# Patient Record
Sex: Male | Born: 1950 | Race: White | Hispanic: No | Marital: Married | Smoking: Former smoker
Health system: Southern US, Community
[De-identification: ages and names within clinical notes are randomized; demographics above are authoritative.]

## PROBLEM LIST (undated history)

## (undated) DIAGNOSIS — A0472 Enterocolitis due to Clostridium difficile, not specified as recurrent: Secondary | ICD-10-CM

## (undated) DIAGNOSIS — D649 Anemia, unspecified: Secondary | ICD-10-CM

## (undated) DIAGNOSIS — I10 Essential (primary) hypertension: Secondary | ICD-10-CM

## (undated) DIAGNOSIS — M1990 Unspecified osteoarthritis, unspecified site: Secondary | ICD-10-CM

## (undated) DIAGNOSIS — K5190 Ulcerative colitis, unspecified, without complications: Secondary | ICD-10-CM

## (undated) DIAGNOSIS — T7840XA Allergy, unspecified, initial encounter: Secondary | ICD-10-CM

## (undated) DIAGNOSIS — C439 Malignant melanoma of skin, unspecified: Secondary | ICD-10-CM

## (undated) DIAGNOSIS — I82409 Acute embolism and thrombosis of unspecified deep veins of unspecified lower extremity: Secondary | ICD-10-CM

## (undated) DIAGNOSIS — E7800 Pure hypercholesterolemia, unspecified: Secondary | ICD-10-CM

## (undated) DIAGNOSIS — Z5189 Encounter for other specified aftercare: Secondary | ICD-10-CM

## (undated) HISTORY — DX: Malignant melanoma of skin, unspecified: C43.9

## (undated) HISTORY — DX: Essential (primary) hypertension: I10

## (undated) HISTORY — DX: Encounter for other specified aftercare: Z51.89

## (undated) HISTORY — DX: Enterocolitis due to Clostridium difficile, not specified as recurrent: A04.72

## (undated) HISTORY — DX: Pure hypercholesterolemia, unspecified: E78.00

## (undated) HISTORY — DX: Allergy, unspecified, initial encounter: T78.40XA

## (undated) HISTORY — DX: Anemia, unspecified: D64.9

## (undated) HISTORY — PX: COLONOSCOPY: SHX174

## (undated) HISTORY — DX: Acute embolism and thrombosis of unspecified deep veins of unspecified lower extremity: I82.409

## (undated) HISTORY — DX: Unspecified osteoarthritis, unspecified site: M19.90

## (undated) HISTORY — DX: Ulcerative colitis, unspecified, without complications: K51.90

---

## 2018-08-30 ENCOUNTER — Encounter: Payer: Self-pay | Admitting: Family Medicine

## 2018-08-30 ENCOUNTER — Ambulatory Visit: Payer: 59 | Admitting: Family Medicine

## 2018-08-30 VITALS — BP 152/82 | HR 85 | Temp 98.8°F | Ht 71.0 in | Wt 236.0 lb

## 2018-08-30 DIAGNOSIS — M545 Low back pain, unspecified: Secondary | ICD-10-CM

## 2018-08-30 DIAGNOSIS — H16139 Photokeratitis, unspecified eye: Secondary | ICD-10-CM | POA: Diagnosis not present

## 2018-08-30 DIAGNOSIS — K51919 Ulcerative colitis, unspecified with unspecified complications: Secondary | ICD-10-CM | POA: Diagnosis not present

## 2018-08-30 DIAGNOSIS — D489 Neoplasm of uncertain behavior, unspecified: Secondary | ICD-10-CM

## 2018-08-30 DIAGNOSIS — I10 Essential (primary) hypertension: Secondary | ICD-10-CM

## 2018-08-30 MED ORDER — VALSARTAN-HYDROCHLOROTHIAZIDE 160-25 MG PO TABS: 1.0000 | ORAL_TABLET | Freq: Every day | ORAL | 3 refills | Status: DC

## 2018-08-30 MED ORDER — CYCLOBENZAPRINE HCL 5 MG PO TABS: 5.0000 mg | ORAL_TABLET | Freq: Three times a day (TID) | ORAL | 0 refills | Status: DC | PRN

## 2018-08-30 MED ORDER — FOLIC ACID 1 MG PO TABS: 1.0000 mg | ORAL_TABLET | Freq: Every day | ORAL | 3 refills | Status: DC

## 2018-08-30 NOTE — Progress Notes (Signed)
Chief Complaint  Patient presents with  . New Patient (Initial Visit)       New Patient Visit SUBJECTIVE: HPI: Justin Rosales is an 68 y.o.male who is being seen for establishing care.   The patient was previously seen at [facility].  Hypertension Patient presents for hypertension follow up. He does monitor home blood pressures. Blood pressures ranging on average from 130's/70's. He is compliant with medications- Diovan 160-25. Patient has these side effects of medication: none He is adhering to a healthy diet overall. Exercise: golf, walking  2 days ago slept wrong on couch for around 1 hour. bl low back pain. A little better today. No neurologic s/s's.   Has skin lesion on his nose and L check. Nose lesion has been there for several months.  The lesion on his cheek is been there for around 6 months.  It will scab over and never fully go away.  He has no personal or family history of skin cancer.  He was out in the sun when he was younger and did not wear adequate amounts of sunscreen.    Past Medical History:  Diagnosis Date  . Anemia   . Arthritis   . Hypertension    History reviewed. No pertinent surgical history. Family History  Problem Relation Age of Onset  . Hypertension Mother   . Diabetes Mother   . Alzheimer's disease Mother   . Parkinson's disease Father   . Alzheimer's disease Father    Not on File    Current Outpatient Medications:  .  Ferrous Sulfate (IRON) 325 (65 Fe) MG TABS, Take 1 tablet by mouth daily., Disp: , Rfl:  .  metoprolol succinate (TOPROL-XL) 100 MG 24 hr tablet, Take 100 mg by mouth daily. Take with or immediately following a meal., Disp: , Rfl:  .  NON FORMULARY, Lectin shield Take 2 per meal, Disp: , Rfl:  .  NON FORMULARY, Vital reds, Disp: , Rfl:  .  sulfaSALAzine (AZULFIDINE) 500 MG tablet, Take 2 tablets twice daily, Disp: , Rfl:  .  valsartan-hydrochlorothiazide (DIOVAN-HCT) 160-25 MG tablet, Take 1 tablet by mouth daily., Disp: 90  tablet, Rfl: 3 .  VITAMIN D, CHOLECALCIFEROL, PO, Take 5,000 Units by mouth daily., Disp: , Rfl:  .  cyclobenzaprine (FLEXERIL) 5 MG tablet, Take 1 tablet (5 mg total) by mouth 3 (three) times daily as needed for muscle spasms., Disp: 20 tablet, Rfl: 0 .  folic acid (FOLVITE) 1 MG tablet, Take 1 tablet (1 mg total) by mouth daily., Disp: 90 tablet, Rfl: 3  ROS Cardiovascular: Denies chest pain  Respiratory: Denies dyspnea   OBJECTIVE: BP (!) 152/82 (BP Location: Left Arm, Patient Position: Sitting, Cuff Size: Large)   Pulse 85   Temp 98.8 F (37.1 C) (Oral)   Ht 5' 11"  (1.803 m)   Wt 236 lb (107 kg)   SpO2 95%   BMI 32.92 kg/m   Constitutional: -  VS reviewed -  Well developed, well nourished, appears stated age -  No apparent distress  Psychiatric: -  Oriented to person, place, and time -  Memory intact -  Affect and mood normal -  Fluent conversation, good eye contact -  Judgment and insight age appropriate  Eye: -  Conjunctivae clear, no discharge -  Pupils symmetric, round, reactive to light  ENMT: -  MMM    Pharynx moist, no exudate, no erythema  Neck: -  No gross swelling, no palpable masses -  Thyroid midline, not enlarged, mobile,  no palpable masses  Cardiovascular: -  RRR -  No LE edema  Respiratory: -  Normal respiratory effort, no accessory muscle use, no retraction -  Breath sounds equal, no wheezes, no ronchi, no crackles  Gastrointestinal: -  Bowel sounds normal -  No tenderness, no distention, no guarding, no masses  Neurological:  -  CN II - XII grossly intact -  Sensation grossly intact to light touch, equal bilaterally  Musculoskeletal: -  No clubbing, no cyanosis -  Gait normal  Skin: -  See below -  Erythematous and scaly lesion on nose    L cheek  Procedure note; shave biopsy Informed consent was obtained. The area was cleaned with alcohol and injected with 1.5 mL of 1% lidocaine with epinephrine. A Dermablade was slightly bent and used to  cut under the area of interest. The specimen was placed in a sterile specimen cup and sent to the lab. The area was then cauterized ensuring adequate hemostasis. The area was dressed with triple antibiotic ointment and a bandage. There were no complications noted. The patient tolerated the procedure well.  Procedure note: cryotherapy Verbal consent obtained 1 skin lesion on his nose treated Liquid nitrogen was applied via a thin spray creating an ice ball with 1-2 mm corona surrounding the lesion The patient tolerated the procedure well There were no immediate complications noted  ASSESSMENT/PLAN: Ulcerative colitis with complication, unspecified location ([place]) - Plan: sulfaSALAzine (AZULFIDINE) 707 MG tablet, folic acid (FOLVITE) 1 MG tablet, Ambulatory referral to Gastroenterology  Essential hypertension - Plan: valsartan-hydrochlorothiazide (DIOVAN-HCT) 160-25 MG tablet  Acute bilateral low back pain without sciatica - Plan: cyclobenzaprine (FLEXERIL) 5 MG tablet  Neoplasm of uncertain behavior - Plan: Dermatology pathology([place]), PR SHAV SKIN LES 0.6-1.0 CM FACE,FACIAL  Actinic keratitis, unspecified laterality - Plan: PR DESTRUCTION BENIGN LESIONS UP TO 14  Refer to GI, cont current meds. Sounds like he has white coat syndrome.  Continue current medications.  Check blood pressures at home.  Counseled on diet and exercise. Tylenol, stretches/exercises, heat, low-dose of Flexeril.  Warning signs/symptoms verbalized and written down. The area on his nose appears to be an actinic keratotic lesion.  It was frozen today.  If it returns, we will shave it.  On his cheek, I am very concerned for a malignancy.  Biopsy sent to the lab today. Patient should return for physical at convenience. The patient voiced understanding and agreement to the plan.   [place], DO 08/30/18  11:59 AM

## 2018-08-30 NOTE — Patient Instructions (Addendum)
Your goal BP is <150/90.   Ok to stop the metoprolol for now.  Keep the diet clean and stay active.  OK to take Tylenol 1000 mg (2 extra strength tabs) or 975 mg (3 regular strength tabs) every 6 hours as needed.  Take Flexeril (cyclobenzaprine) 1-2 hours before planned bedtime. If it makes you drowsy, do not take during the day. You can try half a tab the following night.  If you do not hear anything about your referral in the next 1-2 weeks, call our office and ask for an update.  Do not shower for the rest of the day. When you do wash it, use only soap and water. Do not vigorously scrub. Apply triple antibiotic ointment (like Neosporin) twice daily. Keep the area clean and dry.   Things to look out for: increasing pain not relieved by ibuprofen/acetaminophen, fevers, spreading redness, drainage of pus, or foul odor.  EXERCISES  RANGE OF MOTION (ROM) AND STRETCHING EXERCISES - Low Back Pain Most people with lower back pain will find that their symptoms get worse with excessive bending forward (flexion) or arching at the lower back (extension). The exercises that will help resolve your symptoms will focus on the opposite motion.  If you have pain, numbness or tingling which travels down into your buttocks, leg or foot, the goal of the therapy is for these symptoms to move closer to your back and eventually resolve. Sometimes, these leg symptoms will get better, but your lower back pain may worsen. This is often an indication of progress in your rehabilitation. Be very alert to any changes in your symptoms and the activities in which you participated in the 24 hours prior to the change. Sharing this information with your caregiver will allow him or her to most efficiently treat your condition. These exercises may help you when beginning to rehabilitate your injury. Your symptoms may resolve with or without further involvement from your physician, physical therapist or athletic trainer. While  completing these exercises, remember:   Restoring tissue flexibility helps normal motion to return to the joints. This allows healthier, less painful movement and activity.  An effective stretch should be held for at least 30 seconds.  A stretch should never be painful. You should only feel a gentle lengthening or release in the stretched tissue. FLEXION RANGE OF MOTION AND STRETCHING EXERCISES:  STRETCH - Flexion, Single Knee to Chest   Lie on a firm bed or floor with both legs extended in front of you.  Keeping one leg in contact with the floor, bring your opposite knee to your chest. Hold your leg in place by either grabbing behind your thigh or at your knee.  Pull until you feel a gentle stretch in your low back. Hold 30 seconds.  Slowly release your grasp and repeat the exercise with the opposite side. Repeat 2 times. Complete this exercise 3 times per week.   STRETCH - Flexion, Double Knee to Chest  Lie on a firm bed or floor with both legs extended in front of you.  Keeping one leg in contact with the floor, bring your opposite knee to your chest.  Tense your stomach muscles to support your back and then lift your other knee to your chest. Hold your legs in place by either grabbing behind your thighs or at your knees.  Pull both knees toward your chest until you feel a gentle stretch in your low back. Hold 30 seconds.  Tense your stomach muscles and slowly return  one leg at a time to the floor. Repeat 2 times. Complete this exercise 3 times per week.   STRETCH - Low Trunk Rotation  Lie on a firm bed or floor. Keeping your legs in front of you, bend your knees so they are both pointed toward the ceiling and your feet are flat on the floor.  Extend your arms out to the side. This will stabilize your upper body by keeping your shoulders in contact with the floor.  Gently and slowly drop both knees together to one side until you feel a gentle stretch in your low back. Hold  for 30 seconds.  Tense your stomach muscles to support your lower back as you bring your knees back to the starting position. Repeat the exercise to the other side. Repeat 2 times. Complete this exercise at least 3 times per week.   EXTENSION RANGE OF MOTION AND FLEXIBILITY EXERCISES:  STRETCH - Extension, Prone on Elbows   Lie on your stomach on the floor, a bed will be too soft. Place your palms about shoulder width apart and at the height of your head.  Place your elbows under your shoulders. If this is too painful, stack pillows under your chest.  Allow your body to relax so that your hips drop lower and make contact more completely with the floor.  Hold this position for 30 seconds.  Slowly return to lying flat on the floor. Repeat 2 times. Complete this exercise 3 times per week.   RANGE OF MOTION - Extension, Prone Press Ups  Lie on your stomach on the floor, a bed will be too soft. Place your palms about shoulder width apart and at the height of your head.  Keeping your back as relaxed as possible, slowly straighten your elbows while keeping your hips on the floor. You may adjust the placement of your hands to maximize your comfort. As you gain motion, your hands will come more underneath your shoulders.  Hold this position 30 seconds.  Slowly return to lying flat on the floor. Repeat 2 times. Complete this exercise 3 times per week.   RANGE OF MOTION- Quadruped, Neutral Spine   Assume a hands and knees position on a firm surface. Keep your hands under your shoulders and your knees under your hips. You may place padding under your knees for comfort.  Drop your head and point your tailbone toward the ground below you. This will round out your lower back like an angry cat. Hold this position for 30 seconds.  Slowly lift your head and release your tail bone so that your back sags into a large arch, like an old horse.  Hold this position for 30 seconds.  Repeat this until  you feel limber in your low back.  Now, find your "sweet spot." This will be the most comfortable position somewhere between the two previous positions. This is your neutral spine. Once you have found this position, tense your stomach muscles to support your low back.  Hold this position for 30 seconds. Repeat 2 times. Complete this exercise 3 times per week.   STRENGTHENING EXERCISES - Low Back Sprain These exercises may help you when beginning to rehabilitate your injury. These exercises should be done near your "sweet spot." This is the neutral, low-back arch, somewhere between fully rounded and fully arched, that is your least painful position. When performed in this safe range of motion, these exercises can be used for people who have either a flexion or extension based  injury. These exercises may resolve your symptoms with or without further involvement from your physician, physical therapist or athletic trainer. While completing these exercises, remember:   Muscles can gain both the endurance and the strength needed for everyday activities through controlled exercises.  Complete these exercises as instructed by your physician, physical therapist or athletic trainer. Increase the resistance and repetitions only as guided.  You may experience muscle soreness or fatigue, but the pain or discomfort you are trying to eliminate should never worsen during these exercises. If this pain does worsen, stop and make certain you are following the directions exactly. If the pain is still present after adjustments, discontinue the exercise until you can discuss the trouble with your caregiver.  STRENGTHENING - Deep Abdominals, Pelvic Tilt   Lie on a firm bed or floor. Keeping your legs in front of you, bend your knees so they are both pointed toward the ceiling and your feet are flat on the floor.  Tense your lower abdominal muscles to press your low back into the floor. This motion will rotate your  pelvis so that your tail bone is scooping upwards rather than pointing at your feet or into the floor. With a gentle tension and even breathing, hold this position for 3 seconds. Repeat 2 times. Complete this exercise 3 times per week.   STRENGTHENING - Abdominals, Crunches   Lie on a firm bed or floor. Keeping your legs in front of you, bend your knees so they are both pointed toward the ceiling and your feet are flat on the floor. Cross your arms over your chest.  Slightly tip your chin down without bending your neck.  Tense your abdominals and slowly lift your trunk high enough to just clear your shoulder blades. Lifting higher can put excessive stress on the lower back and does not further strengthen your abdominal muscles.  Control your return to the starting position. Repeat 2 times. Complete this exercise 3 times per week.   STRENGTHENING - Quadruped, Opposite UE/LE Lift   Assume a hands and knees position on a firm surface. Keep your hands under your shoulders and your knees under your hips. You may place padding under your knees for comfort.  Find your neutral spine and gently tense your abdominal muscles so that you can maintain this position. Your shoulders and hips should form a rectangle that is parallel with the floor and is not twisted.  Keeping your trunk steady, lift your right hand no higher than your shoulder and then your left leg no higher than your hip. Make sure you are not holding your breath. Hold this position for 30 seconds.  Continuing to keep your abdominal muscles tense and your back steady, slowly return to your starting position. Repeat with the opposite arm and leg. Repeat 2 times. Complete this exercise 3 times per week.   STRENGTHENING - Abdominals and Quadriceps, Straight Leg Raise   Lie on a firm bed or floor with both legs extended in front of you.  Keeping one leg in contact with the floor, bend the other knee so that your foot can rest flat on the  floor.  Find your neutral spine, and tense your abdominal muscles to maintain your spinal position throughout the exercise.  Slowly lift your straight leg off the floor about 6 inches for a count of 3, making sure to not hold your breath.  Still keeping your neutral spine, slowly lower your leg all the way to the floor. Repeat this exercise  with each leg 2 times. Complete this exercise 3 times per week.  POSTURE AND BODY MECHANICS CONSIDERATIONS - Low Back Sprain Keeping correct posture when sitting, standing or completing your activities will reduce the stress put on different body tissues, allowing injured tissues a chance to heal and limiting painful experiences. The following are general guidelines for improved posture.  While reading these guidelines, remember:  The exercises prescribed by your provider will help you have the flexibility and strength to maintain correct postures.  The correct posture provides the best environment for your joints to work. All of your joints have less wear and tear when properly supported by a spine with good posture. This means you will experience a healthier, less painful body.  Correct posture must be practiced with all of your activities, especially prolonged sitting and standing. Correct posture is as important when doing repetitive low-stress activities (typing) as it is when doing a single heavy-load activity (lifting).  RESTING POSITIONS Consider which positions are most painful for you when choosing a resting position. If you have pain with flexion-based activities (sitting, bending, stooping, squatting), choose a position that allows you to rest in a less flexed posture. You would want to avoid curling into a fetal position on your side. If your pain worsens with extension-based activities (prolonged standing, working overhead), avoid resting in an extended position such as sleeping on your stomach. Most people will find more comfort when they rest  with their spine in a more neutral position, neither too rounded nor too arched. Lying on a non-sagging bed on your side with a pillow between your knees, or on your back with a pillow under your knees will often provide some relief. Keep in mind, being in any one position for a prolonged period of time, no matter how correct your posture, can still lead to stiffness.  PROPER SITTING POSTURE In order to minimize stress and discomfort on your spine, you must sit with correct posture. Sitting with good posture should be effortless for a healthy body. Returning to good posture is a gradual process. Many people can work toward this most comfortably by using various supports until they have the flexibility and strength to maintain this posture on their own. When sitting with proper posture, your ears will fall over your shoulders and your shoulders will fall over your hips. You should use the back of the chair to support your upper back. Your lower back will be in a neutral position, just slightly arched. You may place a small pillow or folded towel at the base of your lower back for  support.  When working at a desk, create an environment that supports good, upright posture. Without extra support, muscles tire, which leads to excessive strain on joints and other tissues. Keep these recommendations in mind:  CHAIR:  A chair should be able to slide under your desk when your back makes contact with the back of the chair. This allows you to work closely.  The chair's height should allow your eyes to be level with the upper part of your monitor and your hands to be slightly lower than your elbows.  BODY POSITION  Your feet should make contact with the floor. If this is not possible, use a foot rest.  Keep your ears over your shoulders. This will reduce stress on your neck and low back.  INCORRECT SITTING POSTURES  If you are feeling tired and unable to assume a healthy sitting posture, do not slouch or  slump.  This puts excessive strain on your back tissues, causing more damage and pain. Healthier options include:  Using more support, like a lumbar pillow.  Switching tasks to something that requires you to be upright or walking.  Talking a brief walk.  Lying down to rest in a neutral-spine position.  PROLONGED STANDING WHILE SLIGHTLY LEANING FORWARD  When completing a task that requires you to lean forward while standing in one place for a long time, place either foot up on a stationary 2-4 inch high object to help maintain the best posture. When both feet are on the ground, the lower back tends to lose its slight inward curve. If this curve flattens (or becomes too large), then the back and your other joints will experience too much stress, tire more quickly, and can cause pain.  CORRECT STANDING POSTURES Proper standing posture should be assumed with all daily activities, even if they only take a few moments, like when brushing your teeth. As in sitting, your ears should fall over your shoulders and your shoulders should fall over your hips. You should keep a slight tension in your abdominal muscles to brace your spine. Your tailbone should point down to the ground, not behind your body, resulting in an over-extended swayback posture.   INCORRECT STANDING POSTURES  Common incorrect standing postures include a forward head, locked knees and/or an excessive swayback. WALKING Walk with an upright posture. Your ears, shoulders and hips should all line-up.  PROLONGED ACTIVITY IN A FLEXED POSITION When completing a task that requires you to bend forward at your waist or lean over a low surface, try to find a way to stabilize 3 out of 4 of your limbs. You can place a hand or elbow on your thigh or rest a knee on the surface you are reaching across. This will provide you more stability, so that your muscles do not tire as quickly. By keeping your knees relaxed, or slightly bent, you will also reduce  stress across your lower back. CORRECT LIFTING TECHNIQUES  DO :  Assume a wide stance. This will provide you more stability and the opportunity to get as close as possible to the object which you are lifting.  Tense your abdominals to brace your spine. Bend at the knees and hips. Keeping your back locked in a neutral-spine position, lift using your leg muscles. Lift with your legs, keeping your back straight.  Test the weight of unknown objects before attempting to lift them.  Try to keep your elbows locked down at your sides in order get the best strength from your shoulders when carrying an object.     Always ask for help when lifting heavy or awkward objects. INCORRECT LIFTING TECHNIQUES DO NOT:   Lock your knees when lifting, even if it is a small object.  Bend and twist. Pivot at your feet or move your feet when needing to change directions.  Assume that you can safely pick up even a paperclip without proper posture.

## 2018-09-01 ENCOUNTER — Other Ambulatory Visit: Payer: Self-pay | Admitting: Family Medicine

## 2018-09-01 DIAGNOSIS — C44319 Basal cell carcinoma of skin of other parts of face: Secondary | ICD-10-CM

## 2018-09-03 ENCOUNTER — Telehealth: Payer: Self-pay | Admitting: *Deleted

## 2018-09-03 NOTE — Telephone Encounter (Signed)
Received Dermatopathology Report results from [facility]; forwarded to provider/SLS 02/14

## 2018-10-04 ENCOUNTER — Encounter: Payer: Self-pay | Admitting: Family Medicine

## 2018-10-04 ENCOUNTER — Ambulatory Visit (INDEPENDENT_AMBULATORY_CARE_PROVIDER_SITE_OTHER): Payer: 59 | Admitting: Family Medicine

## 2018-10-04 ENCOUNTER — Other Ambulatory Visit: Payer: Self-pay

## 2018-10-04 VITALS — BP 152/82 | HR 80 | Temp 98.5°F | Ht 70.0 in | Wt 237.4 lb

## 2018-10-04 DIAGNOSIS — Z23 Encounter for immunization: Secondary | ICD-10-CM

## 2018-10-04 DIAGNOSIS — Z136 Encounter for screening for cardiovascular disorders: Secondary | ICD-10-CM | POA: Diagnosis not present

## 2018-10-04 DIAGNOSIS — Z0000 Encounter for general adult medical examination without abnormal findings: Secondary | ICD-10-CM

## 2018-10-04 DIAGNOSIS — Z1159 Encounter for screening for other viral diseases: Secondary | ICD-10-CM

## 2018-10-04 LAB — COMPREHENSIVE METABOLIC PANEL
ALT: 29 U/L (ref 0–53)
AST: 30 U/L (ref 0–37)
Albumin: 4.2 g/dL (ref 3.5–5.2)
Alkaline Phosphatase: 80 U/L (ref 39–117)
BUN: 15 mg/dL (ref 6–23)
CALCIUM: 9.4 mg/dL (ref 8.4–10.5)
CO2: 29 mEq/L (ref 19–32)
Chloride: 104 mEq/L (ref 96–112)
Creatinine, Ser: 0.98 mg/dL (ref 0.40–1.50)
GFR: 76.17 mL/min (ref >60.00)
Glucose, Bld: 107 mg/dL — ABNORMAL HIGH (ref 70–99)
POTASSIUM: 5.1 meq/L (ref 3.5–5.1)
Sodium: 139 mEq/L (ref 135–145)
TOTAL PROTEIN: 6.9 g/dL (ref 6.0–8.3)
Total Bilirubin: 0.5 mg/dL (ref 0.2–1.2)

## 2018-10-04 LAB — LIPID PANEL
Cholesterol: 167 mg/dL (ref 0–200)
HDL: 50.5 mg/dL (ref >39.00)
LDL CALC: 97 mg/dL (ref 0–99)
NonHDL: 116.11
Total CHOL/HDL Ratio: 3
Triglycerides: 97 mg/dL (ref 0.0–149.0)
VLDL: 19.4 mg/dL (ref 0.0–40.0)

## 2018-10-04 MED ORDER — AMLODIPINE BESYLATE-VALSARTAN 5-160 MG PO TABS: 1.0000 | ORAL_TABLET | Freq: Every day | ORAL | 3 refills | Status: DC

## 2018-10-04 NOTE — Progress Notes (Signed)
Chief Complaint  Patient presents with  . Annual Exam    Well Male Justin Rosales is here for a complete physical.   His last physical was >1 year ago.  Current diet: in general, a "good" diet.   Current exercise: stretching, exercises for back Weight trend: stable Daytime fatigue? No. Seat belt? Yes.    Health maintenance Shingrix- No Colonoscopy- Yes 7 years; has UC Tetanus- No Hep C- No Prostate cancer screening- Has in past, declines today Pneumonia vaccine- No AAA screening- No  Past Medical History:  Diagnosis Date  . Anemia   . Arthritis   . Hypertension      History reviewed. No pertinent surgical history.  Medications  Current Outpatient Medications on File Prior to Visit  Medication Sig Dispense Refill  . cyclobenzaprine (FLEXERIL) 5 MG tablet Take 1 tablet (5 mg total) by mouth 3 (three) times daily as needed for muscle spasms. 20 tablet 0  . Ferrous Sulfate (IRON) 325 (65 Fe) MG TABS Take 1 tablet by mouth daily.    . folic acid (FOLVITE) 1 MG tablet Take 1 tablet (1 mg total) by mouth daily. 90 tablet 3  . metoprolol succinate (TOPROL-XL) 100 MG 24 hr tablet Take 100 mg by mouth daily. Take with or immediately following a meal.    . NON FORMULARY Lectin shield Take 2 per meal    . NON FORMULARY Vital reds    . sulfaSALAzine (AZULFIDINE) 500 MG tablet Take 2 tablets twice daily    . valsartan-hydrochlorothiazide (DIOVAN-HCT) 160-25 MG tablet Take 1 tablet by mouth daily. 90 tablet 3  . VITAMIN D, CHOLECALCIFEROL, PO Take 5,000 Units by mouth daily.     Allergies No Known Allergies  Family History Family History  Problem Relation Age of Onset  . Hypertension Mother   . Diabetes Mother   . Alzheimer's disease Mother   . Parkinson's disease Father   . Alzheimer's disease Father     Review of Systems: Constitutional:  no fevers or chills Eye:  no recent significant change in vision Ear/Nose/Mouth/Throat:  Ears:  no recent hearing  loss Nose/Mouth/Throat:  no complaints of nasal congestion or sore throat Cardiovascular:  no chest pain, no palpitations Respiratory:  no cough and no shortness of breath Gastrointestinal:  no abdominal pain, no change in bowel habits GU:  Male: negative for dysuria, frequency, and incontinence and negative for prostate symptoms Musculoskeletal/Extremities:  no pain, redness, or swelling of the joints Integumentary (Skin):  no abnormal skin lesions reported Neurologic:  no headaches, Endocrine:  No unexpected weight changes Hematologic/Lymphatic:  no areas of easy bruising  Exam BP (!) 152/82 (BP Location: Left Arm, Patient Position: Sitting, Cuff Size: Large)   Pulse 80   Temp 98.5 F (36.9 C) (Oral)   Ht 5' 10"  (1.778 m)   Wt 237 lb 6 oz (107.7 kg)   SpO2 95%   BMI 34.06 kg/m  General:  well developed, well nourished, in no apparent distress Skin:  no significant moles, warts, or growths Head:  no masses, lesions, or tenderness Eyes:  pupils equal and round, sclera anicteric without injection Ears:  canals without lesions, TMs shiny without retraction, no obvious effusion, no erythema Nose:  nares patent, septum midline, mucosa normal Throat/Pharynx:  lips and gingiva without lesion; tongue and uvula midline; non-inflamed pharynx; no exudates or postnasal drainage Neck: neck supple without adenopathy, thyromegaly, or masses Lungs:  clear to auscultation, breath sounds equal bilaterally, no respiratory distress Cardio:  regular rate and  rhythm, no LE edema or bruits Abdomen:  abdomen soft, nontender; bowel sounds normal; no masses or organomegaly Rectal: Deferred Musculoskeletal:  symmetrical muscle groups noted without atrophy or deformity Extremities:  no clubbing, cyanosis, or edema, no deformities, no skin discoloration Neuro:  gait normal; deep tendon reflexes normal and symmetric Psych: well oriented with normal range of affect and appropriate  judgment/insight  Assessment and Plan  Well adult exam - Plan: Comprehensive metabolic panel, Lipid panel  Screening for AAA (abdominal aortic aneurysm) - Plan: US AORTA  Encounter for hepatitis C screening test for low risk patient - Plan: Hepatitis C antibody  Need for vaccination against Streptococcus pneumoniae - Plan: Pneumococcal polysaccharide vaccine 23-valent greater than or equal to 2yo subcutaneous/IM   Well 68 y.o. male. Counseled on diet and exercise. Info on Shingrix given.  Other orders as above. Follow up in 6 mo unless BP uncontrolled, he will let us know.  The patient voiced understanding and agreement to the plan.  [place], DO 10/04/18 7:47 AM

## 2018-10-04 NOTE — Patient Instructions (Addendum)
Give [place] 2-3 business days to get the results of your labs back.   Keep the diet clean and stay active.  The new Shingrix vaccine (for shingles) is a 2 shot series. It can make people feel low energy, achy and almost like they have the flu for 48 hours after injection. Please plan accordingly when deciding on when to get this shot. Call our office for a nurse visit appointment to get this. The second shot of the series is less severe regarding the side effects, but it still lasts 48 hours.   Let me know if you would like the prostate cancer screening. This is a grey area of medicine.   Someone will reach out to you shortly regarding your abdominal ultrasound.  You do not need to worry about your umbilical hernia. Let me know if things change.  Let us know if you need anything.

## 2018-10-05 LAB — HEPATITIS C ANTIBODY
Hepatitis C Ab: NONREACTIVE
SIGNAL TO CUT-OFF: 0.09 (ref <1.00)

## 2018-10-06 ENCOUNTER — Other Ambulatory Visit: Payer: Self-pay | Admitting: Family Medicine

## 2018-10-06 DIAGNOSIS — R739 Hyperglycemia, unspecified: Secondary | ICD-10-CM

## 2018-10-06 NOTE — Progress Notes (Signed)
k

## 2018-11-01 ENCOUNTER — Encounter: Payer: Self-pay | Admitting: Family Medicine

## 2019-04-25 ENCOUNTER — Other Ambulatory Visit: Payer: Self-pay

## 2019-04-25 ENCOUNTER — Encounter: Payer: Self-pay | Admitting: Family Medicine

## 2019-04-25 ENCOUNTER — Ambulatory Visit: Payer: 59 | Admitting: Family Medicine

## 2019-04-25 VITALS — BP 162/72 | HR 76 | Temp 97.8°F | Ht 70.0 in | Wt 241.0 lb

## 2019-04-25 DIAGNOSIS — R739 Hyperglycemia, unspecified: Secondary | ICD-10-CM

## 2019-04-25 DIAGNOSIS — I10 Essential (primary) hypertension: Secondary | ICD-10-CM | POA: Diagnosis not present

## 2019-04-25 DIAGNOSIS — K51919 Ulcerative colitis, unspecified with unspecified complications: Secondary | ICD-10-CM

## 2019-04-25 LAB — HEMOGLOBIN A1C: Hgb A1c MFr Bld: 5.8 % (ref 4.6–6.5)

## 2019-04-25 MED ORDER — SULFASALAZINE 500 MG PO TABS: 1000.0000 mg | ORAL_TABLET | Freq: Three times a day (TID) | ORAL | 1 refills | Status: DC

## 2019-04-25 MED ORDER — FOLIC ACID 1 MG PO TABS: 1.0000 mg | ORAL_TABLET | Freq: Every day | ORAL | 3 refills | Status: DC

## 2019-04-25 NOTE — Progress Notes (Signed)
Chief Complaint  Patient presents with  . Follow-up    Subjective Justin Rosales is a 68 y.o. male who presents for hypertension follow up. He does monitor home blood pressures. He is compliant with medications. Patient has these side effects of medication: none He is sometimes adhering to a healthy diet overall. Current exercise: walking; active at work, golfing  Patient has a history of ulcerative colitis.  He is currently on sulfasalazine and folic acid daily.  He has not yet set up with a gastroenterologist in the area due to concerns with the pandemic.  He is also due for a colonoscopy.  Patient had a history of hyperglycemia and A1c was supposed to be done earlier in the year.  It was not.  Diet/exercise has been largely unchanged.   Past Medical History:  Diagnosis Date  . Anemia   . Arthritis   . Hypertension    Review of Systems Cardiovascular: no chest pain Respiratory:  no shortness of breath  Exam BP (!) 162/72 (BP Location: Left Arm, Patient Position: Sitting, Cuff Size: Normal)   Pulse 76   Temp 97.8 F (36.6 C) (Temporal)   Ht 5' 10"  (1.778 m)   Wt 241 lb (109.3 kg)   SpO2 94%   BMI 34.58 kg/m  General:  well developed, well nourished, in no apparent distress Heart: RRR, no bruits, no LE edema Lungs: clear to auscultation, no accessory muscle use Psych: well oriented with normal range of affect and appropriate judgment/insight  Essential hypertension  Hyperglycemia - Plan: Hemoglobin A1c  Ulcerative colitis with complication, unspecified location (HCC) - Plan: sulfaSALAzine (AZULFIDINE) 161 MG tablet, folic acid (FOLVITE) 1 MG tablet  1-we will see what home readings are.  Counseled on diet and exercise.  May increase dose of Exforge versus adding a beta-blocker.  He did not do well with hydrochlorothiazide due to cramping. 2-check A1c 3-I will refill his sulfasalazine but I did encourage him to set up with a gastroenterologist as I do not routinely  prescribe this medicine should he have issues. F/u in 2-3 weeks for a nurse visit to recheck his blood pressure; instructed him to get a monitor and check at home. The patient voiced understanding and agreement to the plan.  [place], DO 04/25/19  10:38 AM

## 2019-04-25 NOTE — Patient Instructions (Addendum)
Keep the diet clean and stay active.  Aim to do some physical exertion for 150 minutes per week. This is typically divided into 5 days per week, 30 minutes per day. The activity should be enough to get your heart rate up. Anything is better than nothing if you have time constraints.  Around 3 times per week, check your blood pressure 4 times per day. Twice in the morning and twice in the evening. The readings should be at least one minute apart. Write down these values and bring them to your next nurse visit/appointment.  When you check your BP, make sure you have been doing something calm/relaxing 5 minutes prior to checking. Both feet should be flat on the floor and you should be sitting. Use your left arm and make sure it is in a relaxed position (on a table), and that the cuff is at the approximate level/height of your heart.  Call your dermatologist. Let me know if you need a referral.   Not a bad idea to call the gastroenterologist.   I recommend getting the flu shot in mid October. This suggestion would change if the CDC comes out with a different recommendation.   Let us know if you need anything.

## 2019-04-28 ENCOUNTER — Telehealth: Payer: Self-pay | Admitting: *Deleted

## 2019-04-28 NOTE — Telephone Encounter (Signed)
Needed to ask how he is taking sulfasalazine.  Message left and advised that we will just ask Dr. Nani Ravens tomorrow.

## 2019-05-10 ENCOUNTER — Other Ambulatory Visit: Payer: Self-pay | Admitting: *Deleted

## 2019-05-10 ENCOUNTER — Ambulatory Visit (INDEPENDENT_AMBULATORY_CARE_PROVIDER_SITE_OTHER): Payer: 59 | Admitting: Family Medicine

## 2019-05-10 ENCOUNTER — Other Ambulatory Visit: Payer: Self-pay

## 2019-05-10 VITALS — BP 147/77 | HR 74

## 2019-05-10 DIAGNOSIS — I10 Essential (primary) hypertension: Secondary | ICD-10-CM

## 2019-05-10 DIAGNOSIS — Z23 Encounter for immunization: Secondary | ICD-10-CM

## 2019-05-10 DIAGNOSIS — K51919 Ulcerative colitis, unspecified with unspecified complications: Secondary | ICD-10-CM

## 2019-05-10 MED ORDER — AMLODIPINE BESYLATE-VALSARTAN 5-320 MG PO TABS: 1.0000 | ORAL_TABLET | Freq: Every day | ORAL | 0 refills | Status: DC

## 2019-05-10 MED ORDER — SULFASALAZINE 500 MG PO TABS: 1000.0000 mg | ORAL_TABLET | Freq: Three times a day (TID) | ORAL | 1 refills | Status: DC

## 2019-05-10 NOTE — Progress Notes (Signed)
Patient here today for blood pressure check per Dr. Nani Ravens.   At last visit on 04/25/19 blood pressure was 162/72 pulse 76.  He takes Exforge 5/189m  Advised at that time to take blood pressures at home 3 times a week 4 times a day.  Blood pressure today  Right 165/80  Pulse 78 Left  147/77 pulse 74   Patient has had his machine for about 15 plus years  Blood pressure in office with his machine was  Left  174/83 pulse 83 Right 170/89 pulse 77  Per Dr WNani Ravens  Increase Exforge to 5/320 qd and recheck in 2 weeks and invest in new machine.  Patient advised of plan.  [facility]  It will be about a week until he get it in, so appointment made for 3 weeks.

## 2019-05-11 ENCOUNTER — Encounter: Payer: Self-pay | Admitting: Gastroenterology

## 2019-05-17 ENCOUNTER — Ambulatory Visit: Payer: 59 | Admitting: Gastroenterology

## 2019-05-17 ENCOUNTER — Other Ambulatory Visit: Payer: Self-pay

## 2019-05-17 ENCOUNTER — Encounter: Payer: Self-pay | Admitting: Gastroenterology

## 2019-05-17 VITALS — BP 174/80 | HR 113 | Temp 98.5°F | Ht 70.0 in | Wt 240.1 lb

## 2019-05-17 DIAGNOSIS — K51919 Ulcerative colitis, unspecified with unspecified complications: Secondary | ICD-10-CM | POA: Diagnosis not present

## 2019-05-17 DIAGNOSIS — Z1159 Encounter for screening for other viral diseases: Secondary | ICD-10-CM | POA: Diagnosis not present

## 2019-05-17 MED ORDER — CLENPIQ 10-3.5-12 MG-GM -GM/160ML PO SOLN: 1.0000 | ORAL | 0 refills | Status: DC

## 2019-05-17 NOTE — Patient Instructions (Addendum)
If you are age 68 or older, your body mass index should be between 23-30. Your Body mass index is 34.45 kg/m. If this is out of the aforementioned range listed, please consider follow up with your Primary Care Provider.  If you are age 41 or younger, your body mass index should be between 19-25. Your Body mass index is 34.45 kg/m. If this is out of the aformentioned range listed, please consider follow up with your Primary Care Provider.   To help prevent the possible spread of infection to our patients, communities, and staff; we will be implementing the following measures:  As of now we are not allowing any visitors/family members to accompany you to any upcoming appointments with [facility]. If you have any concerns about this please contact our office to discuss prior to the appointment.   We have sent the following medications to your pharmacy for you to pick up at your convenience: Clenpiq  You have been scheduled for a colonoscopy. Please follow written instructions given to you at your visit today.  Please pick up your prep supplies at the pharmacy within the next 1-3 days. If you use inhalers (even only as needed), please bring them with you on the day of your procedure. Your physician has requested that you go to www.startemmi.com and enter the access code given to you at your visit today. This web site gives a general overview about your procedure. However, you should still follow specific instructions given to you by our office regarding your preparation for the procedure.  Due to recent COVID-19 restrictions implemented by [employer] and state authorities and in an effort to keep both patients and staff as safe as possible, [facility] requires COVID-19 testing prior to any scheduled endoscopic procedure. The testing center is located at [address] [place], [place] [address] in the [facility] [employer]  suite.   Your appointment has been scheduled for 12/24 at 8:30am.   Please bring your insurance cards to this appointment. You will require your COVID screen 2 business days prior to your endoscopic procedure.  You are not required to quarantine after your screening.  You will only receive a phone call with the results if it is POSITIVE.  If you do not receive a call the day before your procedure you should begin your prep, if ordered, and you should report to the endo center for your procedure at your designated appointment arrival time ( one hour prior to the procedure time). There is no cost to you for the screening on the day of the swab.  [facility] will file with your insurance company for the testing.    You may receive an automated phone call prior to your procedure or have a message in your MyChart that you have an appointment for a BP/15 at the [facility], please disregard this message.  Your testing will be at the [address] [place] location.   If you are leaving [facility] travel [place] on [address], turn left onto [address], turn night onto [address] at the 1st stop light turn right, pass the [employer] on your right and proceed to [place] [address] (white building).   Please go to [place], [place]  to have lab work. Hours are Monday-Friday 7:30am-5:30pm. Please go to the basement level of the building

## 2019-05-17 NOTE — Progress Notes (Signed)
Chief Complaint: Ulcerative colitis  Referring Provider:     Shelda Pal, DO   HPI:    Justin Rosales is a 68 y.o. male with a history of ulcerative colitis referred to the Gastroenterology Clinic to establish care.  Previously followed at the [facility]  Diagnosed approx 18 years ago. Currently treated with sulfasalazine and folic acid. Previously treated with unknown agent by Dr. Ferdinand Lango, then changed to sulfasalazine/folic acid approx 10 years ago.  Has been taking monotherapy since then.  Last colonoscopy was approx 7 years ago and he reports in deep remission at that time. No hospital admissions. Does not think he has had steroids, but he is not certain. Possibly ophtho manifestations, but no prior EIMs.   Oldest son with UC, treated with Humira.   Normal CMP in 09/2018.  Otherwise no labs, imaging, endoscopy reports for review today.  IBD Health Care Maintenance: Annual Flu Vaccine - UTD Pneumococcal Vaccine- UTD Derm- Encouraged yearly skin check, sunscreen and a hat DEXA scan if risk factors for osteoporosis -N/A TB testing if on anti-TNF, yearly - N/A Micronutrient eval-ordered today Last Colonoscopy -7 years ago.  Repeat scheduled today     Past Medical History:  Diagnosis Date  . Anemia   . Arthritis   . Hypertension   . UC (ulcerative colitis) [facility])      Past Surgical History:  Procedure Laterality Date  . COLONOSCOPY     First done at [facility] around age 20. Dr Nelma Rothman [facility] x2. last one done around 2012    Family History  Problem Relation Age of Onset  . Hypertension Mother   . Diabetes Mother   . Alzheimer's disease Mother   . Parkinson's disease Father   . Alzheimer's disease Father   . Colon cancer Neg Hx   . Esophageal cancer Neg Hx    Social History   Tobacco Use  . Smoking status: Former Research scientist (life sciences)  . Smokeless tobacco: Never Used  Substance Use Topics  . Alcohol use: Yes   Comment: ocassionally  . Drug use: Not Currently   Current Outpatient Medications  Medication Sig Dispense Refill  . amLODipine-valsartan (EXFORGE) 5-320 MG tablet Take 1 tablet by mouth daily. 90 tablet 0  . Ferrous Sulfate (IRON) 325 (65 Fe) MG TABS Take 1 tablet by mouth daily.    . folic acid (FOLVITE) 1 MG tablet Take 1 tablet (1 mg total) by mouth daily. 90 tablet 3  . NON FORMULARY as needed. Lectin shield Take 2 per meal     . NON FORMULARY as needed. Vital reds-energy supplement    . sulfaSALAzine (AZULFIDINE) 500 MG tablet Take 2 tablets (1,000 mg total) by mouth 3 (three) times daily. (Patient taking differently: Take 1,000 mg by mouth 2 (two) times daily. ) 540 tablet 1  . VITAMIN D, CHOLECALCIFEROL, PO Take 5,000 Units by mouth daily.     No current facility-administered medications for this visit.    No Known Allergies   Review of Systems: All systems reviewed and negative except where noted in HPI.     Physical Exam:    Wt Readings from Last 3 Encounters:  05/17/19 240 lb 2 oz (108.9 kg)  04/25/19 241 lb (109.3 kg)  10/04/18 237 lb 6 oz (107.7 kg)    BP (!) 174/80   Pulse (!) 113   Temp 98.5 F (36.9 C)   Ht 5' 10"  (  1.778 m)   Wt 240 lb 2 oz (108.9 kg)   BMI 34.45 kg/m  Constitutional:  Pleasant, in no acute distress. Psychiatric: Normal mood and affect. Behavior is normal. EENT: Pupils normal.  Conjunctivae are normal. No scleral icterus. Neck supple. No cervical LAD. Cardiovascular: Normal rate, regular rhythm. No edema Pulmonary/chest: Effort normal and breath sounds normal. No wheezing, rales or rhonchi. Abdominal: Soft, nondistended, nontender. Bowel sounds active throughout. There are no masses palpable. No hepatomegaly. Neurological: Alert and oriented to person place and time. Skin: Skin is warm and dry. No rashes noted.   ASSESSMENT AND PLAN;   1) Ulcerative Colitis: 68 year old male with longstanding history of what sounds to be  left-sided UC, controlled with sulfasalazine monotherapy.  No known history of steroids, and no prior hospital admissions.  In clinical remission.  -Colonoscopy for ongoing surveillance and establish deep remission -Micronutrient evaluation ordered today -Resume sulfasalazine and folic acid as already prescribed -Possible Ophtho EIM in the past, but no recent symptomatology.  Otherwise no EIMs -UTD on vaccinations  The indications, risks, and benefits of colonoscopy were explained to the patient in detail. Risks include but are not limited to bleeding, perforation, adverse reaction to medications, and cardiopulmonary compromise. Sequelae include but are not limited to the possibility of surgery, hospitalization, and mortality. The patient verbalized understanding and wished to proceed. All questions answered, referred to the scheduler and bowel prep ordered. Further recommendations pending results of the exam.    Lavena Bullion, DO, FACG  05/17/2019, 2:57 PM   [place], [place]*

## 2019-05-29 ENCOUNTER — Encounter (HOSPITAL_BASED_OUTPATIENT_CLINIC_OR_DEPARTMENT_OTHER): Payer: Self-pay | Admitting: Emergency Medicine

## 2019-05-29 ENCOUNTER — Emergency Department (HOSPITAL_BASED_OUTPATIENT_CLINIC_OR_DEPARTMENT_OTHER)
Admission: EM | Admit: 2019-05-29 | Discharge: 2019-05-29 | Disposition: A | Discharge status: Home / Self Care | Payer: 59 | Attending: Emergency Medicine | Admitting: Emergency Medicine

## 2019-05-29 ENCOUNTER — Other Ambulatory Visit: Payer: Self-pay

## 2019-05-29 DIAGNOSIS — Z5321 Procedure and treatment not carried out due to patient leaving prior to being seen by health care provider: Secondary | ICD-10-CM | POA: Diagnosis not present

## 2019-05-29 DIAGNOSIS — J029 Acute pharyngitis, unspecified: Secondary | ICD-10-CM | POA: Diagnosis not present

## 2019-05-29 NOTE — ED Triage Notes (Signed)
High BP readings for awhile. His PCP has been making adjustments to his meds. He was concerned today due to it being 212/88 and he has a headache.

## 2019-05-30 ENCOUNTER — Ambulatory Visit: Payer: 59 | Admitting: Family Medicine

## 2019-05-30 ENCOUNTER — Encounter: Payer: Self-pay | Admitting: Family Medicine

## 2019-05-30 ENCOUNTER — Telehealth: Payer: Self-pay | Admitting: *Deleted

## 2019-05-30 VITALS — BP 162/94 | HR 85 | Temp 97.2°F | Ht 70.0 in | Wt 237.0 lb

## 2019-05-30 DIAGNOSIS — I10 Essential (primary) hypertension: Secondary | ICD-10-CM | POA: Diagnosis not present

## 2019-05-30 LAB — BASIC METABOLIC PANEL
BUN: 18 mg/dL (ref 6–23)
CO2: 28 mEq/L (ref 19–32)
Calcium: 9.1 mg/dL (ref 8.4–10.5)
Chloride: 101 mEq/L (ref 96–112)
Creatinine, Ser: 0.95 mg/dL (ref 0.40–1.50)
GFR: 78.8 mL/min (ref >60.00)
Glucose, Bld: 95 mg/dL (ref 70–99)
Potassium: 4.5 mEq/L (ref 3.5–5.1)
Sodium: 137 mEq/L (ref 135–145)

## 2019-05-30 MED ORDER — CHLORTHALIDONE 25 MG PO TABS: 25.0000 mg | ORAL_TABLET | Freq: Every day | ORAL | 2 refills | Status: DC

## 2019-05-30 NOTE — Patient Instructions (Signed)
Give [place] 2-3 business days to get the results of your labs back.   Keep the diet clean and stay active.  Continue checking your BP at home.   Let us know if you need anything.

## 2019-05-30 NOTE — Telephone Encounter (Signed)
Who Is Calling Patient / Member / Family / Caregiver Call Type Triage / Clinical Caller Name s Relationship To Patient Spouse Return Phone Number Please choose phone number Chief Complaint BLOOD PRESSURE HIGH - Systolic (top number) [phone] or [phone] (with symptoms) Reason for Call Symptomatic / Request for Health Information Initial Comment Caller states headache and blood pressure of 200/96. Translation No Nurse Assessment Nurse: Joya Gaskins, RN, Vonna Kotyk Date/Time Eilene Ghazi Time): 05/29/2019 5:09:14 PM Confirm and document reason for call. If symptomatic, describe symptoms. ---Caller states that he has had a HA all day. Most recent BP was 212/98. His BP has been averaging 180/90 (MD aware).

## 2019-05-30 NOTE — Telephone Encounter (Signed)
Patient was advised by nurse on the phone to go to ED.  Patient did go to ED, but left.  I called patient to check status.  He stated that he left because his headache was gone.  I advised a follow up with you today instead of 06/06/19.  appt made for today at 11am.  Patient was appreciative of call.

## 2019-05-30 NOTE — Progress Notes (Signed)
Chief Complaint  Patient presents with  . Hypertension    Subjective Justin Rosales is a 68 y.o. male who presents for hypertension follow up. He does monitor home blood pressures. Blood pressures ranging from 160-200's/100's on average. He is compliant with medications-Exforge 5/320 mg/d. Patient has these side effects of medication: none He is adhering to a healthy diet overall. Current exercise: walking   Past Medical History:  Diagnosis Date  . Anemia   . Arthritis   . Elevated cholesterol   . Hypertension   . UC (ulcerative colitis) ([place])     Review of Systems Cardiovascular: no chest pain Respiratory:  no shortness of breath  Exam BP (!) 162/94 (BP Location: Left Arm, Patient Position: Sitting, Cuff Size: Normal)   Pulse 85   Temp (!) 97.2 F (36.2 C) (Temporal)   Ht 5' 10"  (1.778 m)   Wt 237 lb (107.5 kg)   SpO2 96%   BMI 34.01 kg/m  General:  well developed, well nourished, in no apparent distress Heart: RRR, no bruits, no LE edema Lungs: clear to auscultation, no accessory muscle use Psych: well oriented with normal range of affect and appropriate judgment/insight  Essential hypertension - Plan: Basic Metabolic Panel (BMET), chlorthalidone (HYGROTON) 25 MG tablet  Orders as above.  Continue Exforge, start chlorthalidone.  Check lytes/renal function today.  We will recheck in 2 weeks when he returns.  Continue checking blood pressure at home. Counseled on diet and exercise. The patient voiced understanding and agreement to the plan.  [place], DO 05/30/19  12:00 PM

## 2019-06-06 ENCOUNTER — Ambulatory Visit: Payer: 59 | Admitting: Family Medicine

## 2019-06-13 ENCOUNTER — Telehealth: Payer: Self-pay | Admitting: Gastroenterology

## 2019-06-13 ENCOUNTER — Ambulatory Visit: Payer: 59 | Admitting: Family Medicine

## 2019-06-13 NOTE — Telephone Encounter (Signed)
Received fax from [facility] with previous endoscopy records:  -EGD (03/2007, Dr. Dorrene German): Candida esophagus, normal stomach, normal duodenum.  Normal gastric/duodenal biopsies -Colonoscopy (03/2007, Dr. Dorrene German): Mild colitis/proctitis from 23 cm from anal verge through the rectum, with relative sparing of the distal rectum.  Biopsies with moderate chronic, active colitis.  Small internal hemorrhoids, early sigmoid diverticulosis, otherwise normal colon, normal TI -VCE (04/2007, Dr. Dorrene German): Mild erythema in the duodenal bulb, otherwise normal.  No bleeding.  Good prep, capsule reached the cecum -Colonoscopy (04/2009, Dr. Dorrene German): Active inflammation from hepatic flexure through the rectum without skip areas, characterized by erythema, friability, heme, mucus, edema, exudate.  Relative sparing of the distal rectum thought 2/2 hydrocortisone suppositories, Canasa, Rowasa enemas.  Biopsies with severe active, chronic colitis.  Biopsies of the right colon were normal.  Small internal hemorrhoids.  Normal TI.

## 2019-06-14 ENCOUNTER — Other Ambulatory Visit: Payer: Self-pay

## 2019-06-14 ENCOUNTER — Ambulatory Visit: Payer: 59 | Admitting: Family Medicine

## 2019-06-14 ENCOUNTER — Encounter: Payer: Self-pay | Admitting: Family Medicine

## 2019-06-14 VITALS — BP 152/70 | HR 95 | Temp 97.5°F | Ht 71.0 in | Wt 234.5 lb

## 2019-06-14 DIAGNOSIS — I10 Essential (primary) hypertension: Secondary | ICD-10-CM

## 2019-06-14 MED ORDER — AMLODIPINE BESYLATE-VALSARTAN 10-320 MG PO TABS: 1.0000 | ORAL_TABLET | Freq: Every day | ORAL | 3 refills | Status: DC

## 2019-06-14 NOTE — Progress Notes (Signed)
Chief Complaint  Patient presents with  . Hypertension    Subjective Justin Rosales is a 68 y.o. male who presents for hypertension follow up. He does monitor home blood pressures. Blood pressures ranging from 140's/60-80's on average. He is compliant with medications- chlorthalidone 25 mg/d, Exforge 5-320 mg/d. Patient has these side effects of medication: freq urination w chlorthalidone He is adhering to a healthy diet overall. Current exercise: walking   Past Medical History:  Diagnosis Date  . Anemia   . Arthritis   . Elevated cholesterol   . Hypertension   . UC (ulcerative colitis) ([facility])     Review of Systems Cardiovascular: no chest pain Respiratory:  no shortness of breath  Exam BP (!) 152/70 (BP Location: Left Arm, Patient Position: Sitting, Cuff Size: Normal)   Pulse 95   Temp (!) 97.5 F (36.4 C) (Temporal)   Ht 5' 11"  (1.803 m)   Wt 234 lb 8 oz (106.4 kg)   SpO2 96%   BMI 32.71 kg/m  General:  well developed, well nourished, in no apparent distress Heart: RRR, no bruits, no LE edema Lungs: clear to auscultation, no accessory muscle use Psych: well oriented with normal range of affect and appropriate judgment/insight  Essential hypertension - Plan: amLODipine-valsartan (EXFORGE) 10-320 MG tablet  We are having some improvement. Increase dose of amlopdipine to 10 mg/d from 5 and keep valsartan dosage the same. Warned of LE edema. Would add BB if no improvement by next visit.  Counseled on diet and exercise. F/u in 3 weeks. The patient voiced understanding and agreement to the plan.  [place], DO 06/14/19  3:04 PM

## 2019-06-14 NOTE — Patient Instructions (Addendum)
Keep the diet clean and stay active.  Keep checking your blood pressure at home.    If you have swelling, let me know.   Let us know if you need anything.

## 2019-07-03 ENCOUNTER — Other Ambulatory Visit: Payer: Self-pay | Admitting: Family Medicine

## 2019-07-04 ENCOUNTER — Encounter: Payer: Self-pay | Admitting: Gastroenterology

## 2019-07-08 ENCOUNTER — Other Ambulatory Visit: Payer: Self-pay

## 2019-07-11 ENCOUNTER — Other Ambulatory Visit: Payer: Self-pay

## 2019-07-11 ENCOUNTER — Ambulatory Visit: Payer: 59 | Admitting: Family Medicine

## 2019-07-11 ENCOUNTER — Encounter: Payer: Self-pay | Admitting: Family Medicine

## 2019-07-11 VITALS — BP 132/82 | HR 104 | Temp 96.2°F | Wt 240.0 lb

## 2019-07-11 DIAGNOSIS — E669 Obesity, unspecified: Secondary | ICD-10-CM | POA: Diagnosis not present

## 2019-07-11 DIAGNOSIS — I10 Essential (primary) hypertension: Secondary | ICD-10-CM | POA: Diagnosis not present

## 2019-07-11 MED ORDER — CHLORTHALIDONE 25 MG PO TABS: 25.0000 mg | ORAL_TABLET | Freq: Every day | ORAL | 2 refills | Status: DC

## 2019-07-11 MED ORDER — AMLODIPINE BESYLATE-VALSARTAN 10-320 MG PO TABS: 1.0000 | ORAL_TABLET | Freq: Every day | ORAL | 2 refills | Status: DC

## 2019-07-11 NOTE — Progress Notes (Signed)
Chief Complaint  Patient presents with  . Follow-up    Subjective Justin Rosales is a 68 y.o. male who presents for hypertension follow up. He does monitor home blood pressures. Blood pressures ranging from 130's/70-80's on average. He is compliant with medications. Patient has these side effects of medication: none He sometimes adhering to a healthy diet overall. Current exercise: walking   Past Medical History:  Diagnosis Date  . Anemia   . Arthritis   . Elevated cholesterol   . Hypertension   . UC (ulcerative colitis) ([place])     Review of Systems Cardiovascular: no chest pain Respiratory:  no shortness of breath  Exam BP 132/82 (BP Location: Left Arm, Patient Position: Sitting, Cuff Size: Normal)   Pulse (!) 104   Temp (!) 96.2 F (35.7 C) (Temporal)   Wt 240 lb (108.9 kg)   SpO2 96%   BMI 33.47 kg/m  General:  well developed, well nourished, in no apparent distress Heart: RRR (HR around 72), no bruits, no LE edema Lungs: clear to auscultation, no accessory muscle use Psych: well oriented with normal range of affect and appropriate judgment/insight  Obesity (BMI 30-39.9)  Essential hypertension - Plan: amLODipine-valsartan (EXFORGE) 10-320 MG tablet, chlorthalidone (HYGROTON) 25 MG tablet  He is at goal for BP. OK to stop checking at home if he wishes. Counseled on diet and exercise. Discussed wt loss and goals. Goal wt in 6 mo is 220's (220-229 lbs). This was written down as a goal and he will do this by cleaning up his diet.  F/u in 6 mo for CPE or prn. The patient voiced understanding and agreement to the plan.  [place], DO 07/11/19  7:13 AM

## 2019-07-11 NOTE — Patient Instructions (Signed)
Keep the diet clean and stay active.  Because your blood pressure is well-controlled, you no longer have to check your blood pressure at home anymore unless you wish. Some people check it twice daily every day and some people stop altogether. Either or anything in between is fine. Strong work!  Goal weight: 220-229 lbs  Let us know if you need anything.

## 2019-07-13 ENCOUNTER — Other Ambulatory Visit: Payer: Self-pay | Admitting: Gastroenterology

## 2019-07-13 ENCOUNTER — Ambulatory Visit (INDEPENDENT_AMBULATORY_CARE_PROVIDER_SITE_OTHER): Payer: 59

## 2019-07-13 DIAGNOSIS — Z1159 Encounter for screening for other viral diseases: Secondary | ICD-10-CM

## 2019-07-14 LAB — SARS CORONAVIRUS 2 (TAT 6-24 HRS): SARS Coronavirus 2: NEGATIVE

## 2019-07-18 ENCOUNTER — Encounter: Payer: Self-pay | Admitting: Gastroenterology

## 2019-07-18 ENCOUNTER — Ambulatory Visit (AMBULATORY_SURGERY_CENTER): Payer: 59 | Admitting: Gastroenterology

## 2019-07-18 ENCOUNTER — Other Ambulatory Visit: Payer: Self-pay

## 2019-07-18 VITALS — BP 123/81 | HR 83 | Temp 98.5°F | Resp 17 | Ht 70.0 in | Wt 240.0 lb

## 2019-07-18 DIAGNOSIS — K529 Noninfective gastroenteritis and colitis, unspecified: Secondary | ICD-10-CM | POA: Diagnosis not present

## 2019-07-18 DIAGNOSIS — D125 Benign neoplasm of sigmoid colon: Secondary | ICD-10-CM | POA: Diagnosis not present

## 2019-07-18 DIAGNOSIS — K5289 Other specified noninfective gastroenteritis and colitis: Secondary | ICD-10-CM | POA: Diagnosis not present

## 2019-07-18 DIAGNOSIS — D123 Benign neoplasm of transverse colon: Secondary | ICD-10-CM

## 2019-07-18 DIAGNOSIS — K5730 Diverticulosis of large intestine without perforation or abscess without bleeding: Secondary | ICD-10-CM | POA: Diagnosis not present

## 2019-07-18 DIAGNOSIS — K640 First degree hemorrhoids: Secondary | ICD-10-CM

## 2019-07-18 DIAGNOSIS — K51919 Ulcerative colitis, unspecified with unspecified complications: Secondary | ICD-10-CM

## 2019-07-18 MED ORDER — SODIUM CHLORIDE 0.9 % IV SOLN: 500.0000 mL | Freq: Once | INTRAVENOUS | Status: DC

## 2019-07-18 NOTE — Patient Instructions (Signed)
Handouts given on polyps, diverticulosis, and hemorrhoids.  YOU HAD AN ENDOSCOPIC PROCEDURE TODAY AT [facility]:   Refer to the procedure report that was given to you for any specific questions about what was found during the examination.  If the procedure report does not answer your questions, please call your gastroenterologist to clarify.  If you requested that your care partner not be given the details of your procedure findings, then the procedure report has been included in a sealed envelope for you to review at your convenience later.  YOU SHOULD EXPECT: Some feelings of bloating in the abdomen. Passage of more gas than usual.  Walking can help get rid of the air that was put into your GI tract during the procedure and reduce the bloating. If you had a lower endoscopy (such as a colonoscopy or flexible sigmoidoscopy) you may notice spotting of blood in your stool or on the toilet paper. If you underwent a bowel prep for your procedure, you may not have a normal bowel movement for a few days.  Please Note:  You might notice some irritation and congestion in your nose or some drainage.  This is from the oxygen used during your procedure.  There is no need for concern and it should clear up in a day or so.  SYMPTOMS TO REPORT IMMEDIATELY:   Following lower endoscopy (colonoscopy or flexible sigmoidoscopy):  Excessive amounts of blood in the stool  Significant tenderness or worsening of abdominal pains  Swelling of the abdomen that is new, acute  Fever of 100F or higher  For urgent or emergent issues, a gastroenterologist can be reached at any hour by calling [phone].   DIET:  We do recommend a small meal at first, but then you may proceed to your regular diet.  Drink plenty of fluids but you should avoid alcoholic beverages for 24 hours.  ACTIVITY:  You should plan to take it easy for the rest of today and you should NOT DRIVE or use heavy machinery until tomorrow  (because of the sedation medicines used during the test).    FOLLOW UP: Our staff will call the number listed on your records 48-72 hours following your procedure to check on you and address any questions or concerns that you may have regarding the information given to you following your procedure. If we do not reach you, we will leave a message.  We will attempt to reach you two times.  During this call, we will ask if you have developed any symptoms of COVID 19. If you develop any symptoms (ie: fever, flu-like symptoms, shortness of breath, cough etc.) before then, please call [phone].  If you test positive for Covid 19 in the 2 weeks post procedure, please call and report this information to [place].    If any biopsies were taken you will be contacted by phone or by letter within the next 1-3 weeks.  Please call us at [phone] if you have not heard about the biopsies in 3 weeks.    SIGNATURES/CONFIDENTIALITY: You and/or your care partner have signed paperwork which will be entered into your electronic medical record.  These signatures attest to the fact that that the information above on your After Visit Summary has been reviewed and is understood.  Full responsibility of the confidentiality of this discharge information lies with you and/or your care-partner.

## 2019-07-18 NOTE — Op Note (Signed)
[facility] Patient Name: Justin Rosales Procedure Date: 07/18/2019 7:58 AM MRN: [id number] Endoscopist: Gerrit Heck , MD Age: 68 Referring MD:  Date of Birth: 02-04-1951 Gender: Male Account #: [id number] Procedure:                Colonoscopy Indications:              Follow-up of chronic ulcerative pancolitis,                           68 yo male with history of pan-UC, in clinic                            remission with 5-ASA (sulfasalazine and folic                            acid). Last colonsocopy was in 2010 and notable for                            Active inflammation from hepatic flexure through                            the rectum without skip areas, characterized by                            erythema, friability, heme, mucus, edema, exudate.                            Relative sparing of the distal rectum thought 2/2                            hydrocortisone suppositories, Canasa, Rowasa                            enemas. Biopsies with severe active, chronic                            colitis. Biopsies of the right colon were normal.                            Small internal hemorrhoids. Normal TI. Presents                            today for ongoing surveillance. Medicines:                Monitored Anesthesia Care Procedure:                Pre-Anesthesia Assessment:                           - Prior to the procedure, a History and Physical                            was performed, and patient medications and  allergies were reviewed. The patient's tolerance of                            previous anesthesia was also reviewed. The risks                            and benefits of the procedure and the sedation                            options and risks were discussed with the patient.                            All questions were answered, and informed consent                            was obtained. Prior Anticoagulants: The  patient has                            taken no previous anticoagulant or antiplatelet                            agents. ASA Grade Assessment: II - A patient with                            mild systemic disease. After reviewing the risks                            and benefits, the patient was deemed in                            satisfactory condition to undergo the procedure.                           After obtaining informed consent, the colonoscope                            was passed under direct vision. Throughout the                            procedure, the patient's blood pressure, pulse, and                            oxygen saturations were monitored continuously. The                            Colonoscope was introduced through the anus and                            advanced to the the cecum, identified by                            appendiceal orifice and ileocecal valve. The  colonoscopy was performed without difficulty. The                            patient tolerated the procedure well. The quality                            of the bowel preparation was good. The ileocecal                            valve, appendiceal orifice, and rectum were                            photographed. Scope In: 8:09:32 AM Scope Out: 8:37:41 AM Scope Withdrawal Time: 0 hours 24 minutes 25 seconds  Total Procedure Duration: 0 hours 28 minutes 9 seconds  Findings:                 The perianal and digital rectal examinations were                            normal.                           A 3 mm polyp was found in the hepatic flexure. The                            polyp was sessile. The polyp was removed with a                            cold snare. Resection and retrieval were complete.                            Estimated blood loss was minimal.                           A 5 mm polyp was found in the sigmoid colon. The                            polyp was  sessile. The polyp was removed with a                            cold snare. Resection and retrieval were complete.                            Estimated blood loss was minimal.                           A single large-mouthed diverticulum was found in                            the ascending colon.                           The descending colon, transverse colon, ascending  colon and cecum appeared normal. Biopsies were                            taken with a cold forceps for histology and                            dysplasia screening. Estimated blood loss was                            minimal.                           Inflammation characterized by loss of vascularity                            and scarring was found in a continuous and                            circumferential pattern from the rectum to the                            sigmoid colon, starting at 35 cm from the anal                            verge. There was mild erythema and a single                            non-bleeding ulcer in the distal rectum. Biopsies                            were taken with a cold forceps for histology.                            Estimated blood loss was minimal.                           Non-bleeding internal hemorrhoids were found during                            retroflexion and during anoscopy. The hemorrhoids                            were small. Complications:            No immediate complications. Estimated Blood Loss:     Estimated blood loss was minimal. Impression:               - One 3 mm polyp at the hepatic flexure, removed                            with a cold snare. Resected and retrieved.                           - One 5 mm polyp in the sigmoid colon, removed with  a cold snare. Resected and retrieved.                           - Diverticulosis in the ascending colon.                           - The descending colon,  transverse colon, ascending                            colon and cecum are normal. Biopsied.                           - Features of previously diagnosed UC were noted in                            the rectum, rectosigmoid colon, and distal sigmoid                            colon. There was mildly active inflammation in the                            distal rectum (erythema, single ulcer), with                            inactive features of chronic inflammation in the                            remainder of the proximal rectum, rectosigmoid, and                            distal sigmoid colon, characterized by scarring and                            loss of vascularity. Biopsied.                           - Non-bleeding internal hemorrhoids. Recommendation:           - Patient has a contact number available for                            emergencies. The signs and symptoms of potential                            delayed complications were discussed with the                            patient. Return to normal activities tomorrow.                            Written discharge instructions were provided to the                            patient.                           -  Resume previous diet.                           - Continue present medications.                           - Await pathology results.                           - Repeat colonoscopy for surveillance based on                            pathology results.                           - Return to GI clinic at appointment to be                            scheduled.                           - Internal hemorrhoids were noted on this study and                            may be amenable to hemorrhoid band ligation. If you                            are interested in further treatment of these                            hemorrhoids with band ligation, please contact my                            clinic to set up an appointment for  evaluation and                            treatment. Gerrit Heck, MD 07/18/2019 8:51:59 AM

## 2019-07-18 NOTE — Progress Notes (Signed)
Report given to PACU, vss 

## 2019-07-18 NOTE — Progress Notes (Signed)
Temp check by:JB Vital check by:DT  The medical and surgical history was reviewed and verified with the patient.

## 2019-07-20 ENCOUNTER — Telehealth: Payer: Self-pay | Admitting: Gastroenterology

## 2019-07-20 ENCOUNTER — Telehealth: Payer: Self-pay

## 2019-07-20 ENCOUNTER — Other Ambulatory Visit: Payer: Self-pay | Admitting: Family Medicine

## 2019-07-20 DIAGNOSIS — K51919 Ulcerative colitis, unspecified with unspecified complications: Secondary | ICD-10-CM

## 2019-07-20 NOTE — Telephone Encounter (Signed)
Justin Rosales, Can you please assist with this? Thanks Bre

## 2019-07-20 NOTE — Telephone Encounter (Signed)
  Follow up Call-  Call back number 07/18/2019  Post procedure Call Back phone  # [phone]  Permission to leave phone message Yes  Some recent data might be hidden     Patient questions:  Do you have a fever, pain , or abdominal swelling? No. Pain Score  0 *  Have you tolerated food without any problems? Yes.    Have you been able to return to your normal activities? Yes.    Do you have any questions about your discharge instructions: Diet   No. Medications  No. Follow up visit  No.  Do you have questions or concerns about your Care? No.  Actions: * If pain score is 4 or above: No action needed, pain <4. 1. Have you developed a fever since your procedure? no  2.   Have you had an respiratory symptoms (SOB or cough) since your procedure? no  3.   Have you tested positive for COVID 19 since your procedure no  4.   Have you had any family members/close contacts diagnosed with the COVID 19 since your procedure?  no   If yes to any of these questions please route to Joylene John, RN and Alphonsa Gin, Therapist, sports.

## 2019-07-20 NOTE — Telephone Encounter (Signed)
However they labeled?  By colonoscopy report, would think that 6 should be sigmoid colon biopsies and 7 should be rectal biopsies.

## 2019-07-20 NOTE — Telephone Encounter (Signed)
Please review

## 2019-07-22 DIAGNOSIS — A0472 Enterocolitis due to Clostridium difficile, not specified as recurrent: Secondary | ICD-10-CM

## 2019-07-22 HISTORY — DX: Enterocolitis due to Clostridium difficile, not specified as recurrent: A04.72

## 2019-07-26 NOTE — Progress Notes (Signed)
[facility] pathology called and verified that our lab requisition is incorrect. Our bottle 6 and bottle 7 are mixed up. The polyp is in bottle 7 and the biopsies are in bottle 6. Wanted to document this so when the pathology report comes back it will read as such.

## 2019-08-23 ENCOUNTER — Ambulatory Visit: Payer: 59 | Admitting: Gastroenterology

## 2019-11-30 ENCOUNTER — Other Ambulatory Visit: Payer: Self-pay

## 2019-11-30 ENCOUNTER — Ambulatory Visit (INDEPENDENT_AMBULATORY_CARE_PROVIDER_SITE_OTHER): Payer: Medicare Other | Admitting: Medical

## 2019-11-30 ENCOUNTER — Ambulatory Visit: Payer: 59 | Admitting: Family Medicine

## 2019-11-30 VITALS — BP 141/50 | HR 89 | Temp 97.0°F | Resp 18 | Ht 71.0 in | Wt 226.2 lb

## 2019-11-30 DIAGNOSIS — W57XXXA Bitten or stung by nonvenomous insect and other nonvenomous arthropods, initial encounter: Secondary | ICD-10-CM

## 2019-11-30 DIAGNOSIS — M545 Low back pain, unspecified: Secondary | ICD-10-CM

## 2019-11-30 DIAGNOSIS — S30860A Insect bite (nonvenomous) of lower back and pelvis, initial encounter: Secondary | ICD-10-CM | POA: Diagnosis not present

## 2019-11-30 MED ORDER — CYCLOBENZAPRINE HCL 5 MG PO TABS
5.0000 mg | ORAL_TABLET | Freq: Three times a day (TID) | ORAL | 0 refills | Status: DC | PRN
Start: 2019-11-30 — End: 2020-05-21

## 2019-11-30 MED ORDER — MELOXICAM 7.5 MG PO TABS: ORAL_TABLET | ORAL | 0 refills | Status: DC

## 2019-11-30 MED ORDER — DOXYCYCLINE HYCLATE 100 MG PO TABS
100.0000 mg | ORAL_TABLET | Freq: Two times a day (BID) | ORAL | 0 refills | Status: DC
Start: 2019-11-30 — End: 2020-06-18

## 2019-11-30 MED ORDER — KETOROLAC TROMETHAMINE 60 MG/2ML IM SOLN
60.0000 mg | Freq: Once | INTRAMUSCULAR | Status: AC
  Administered 2019-11-30: 30 mg via INTRAMUSCULAR

## 2019-11-30 NOTE — Patient Instructions (Addendum)
You do have recent lower back pain.  Pain mostly in the paraspinal muscles/lumbar region.  No mid spinal pain presently.  Will prescribe meloxicam that you can start tomorrow.  Also making Flexeril muscle relaxant which she can start today.  We gave you Toradol 64m IM injection today.  Stop alleve  If pain persists despite above measures then would recommend lumbar spine x-ray.  For recent tick bite, went ahead and prescribe doxycycline.  Some features of possible skin infection on exam.  Doxycycline has coverage for both Lyme and [facility] fever.  Rx advisement given.  Follow-up in 10 to 14 days or as needed.

## 2019-11-30 NOTE — Progress Notes (Signed)
   Subjective:    Patient ID: Justin Rosales, male    DOB: Mar 15, 1951, 69 y.o.   MRN: [id number]  HPI Pt in for low back spasm 3 days ago. Pt states throbbing and worse with movement. Day before he was spreading a lot of pine straw. Day after this pain worsened. No radicular pain. Pt took one flexeril and one alleve. Helped little bit. No loss of bladder function. No weak legs.  Pt had similar pain like 2020 similar back pain. Pt states given muscle relaxant back then and pain resolved.   Pt also found tick on Sunday. Working in yard on Saturday. In shower he felt little itch. Tick found rt side lower back/cva area. No fever, no chills, no sweats and no palpitation. No neurologic type symptoms.    Review of Systems  Constitutional: Negative for chills, fatigue and fever.  Respiratory: Negative for chest tightness, shortness of breath and wheezing.   Cardiovascular: Negative for chest pain and palpitations.  Gastrointestinal: Negative for abdominal pain.  Musculoskeletal: Positive for back pain.  Skin:       Tick bite.  Hematological: Negative for adenopathy. Does not bruise/bleed easily.  Psychiatric/Behavioral: Negative for behavioral problems and decreased concentration.       Objective:   Physical Exam  General Appearance- Not in acute distress.    Chest and Lung Exam Auscultation: Breath sounds:-Normal. Clear even and unlabored. Adventitious sounds:- No Adventitious sounds.  Cardiovascular Auscultation:Rythm - Regular, rate and rythm. Heart Sounds -Normal heart sounds.  Abdomen Inspection:-Inspection Normal.  Palpation/Perucssion: Palpation and Percussion of the abdomen reveal- Non Tender, No Rebound tenderness, No rigidity(Guarding) and No Palpable abdominal masses.  Liver:-Normal.  Spleen:- Normal.   Back Para  lumbar spine tenderness to palpation. Pain on straight leg lift. Pain on lateral movements and flexion/extension of the spine.  Lower ext  neurologic  L5-S1 sensation intact bilaterally. Normal patellar reflexes bilaterally. No foot drop bilaterally.  Skin- rt cva area small red/pink. Indurated ara but not tender. Small scab center.      Assessment & Plan:  You do have recent lower back pain.  Pain mostly in the paraspinal muscles/lumbar region.  No mid spinal pain presently.  Will prescribe meloxicam that you can start tomorrow.  Also making Flexeril muscle relaxant which she can start today.  We gave you Toradol 37m IM injection today.  Stop alleve  If pain persists despite above measures then would recommend lumbar spine x-ray.  For recent tick bite, went ahead and prescribe doxycycline.  Some features of possible skin infection on exam.  Doxycycline has coverage for both Lyme and [facility] fever.  Rx advisement given.  Follow-up in 10 to 14 days or as needed.  Time spent with patient today was  25 minutes which consisted of chart review, discussing diagnosis, work up treatment and documentation.

## 2019-12-30 ENCOUNTER — Other Ambulatory Visit: Payer: Self-pay | Admitting: Family Medicine

## 2019-12-30 DIAGNOSIS — K51919 Ulcerative colitis, unspecified with unspecified complications: Secondary | ICD-10-CM

## 2020-01-04 ENCOUNTER — Other Ambulatory Visit: Payer: Self-pay | Admitting: Family Medicine

## 2020-01-04 DIAGNOSIS — I10 Essential (primary) hypertension: Secondary | ICD-10-CM

## 2020-02-05 ENCOUNTER — Other Ambulatory Visit: Payer: Self-pay | Admitting: Family Medicine

## 2020-02-05 DIAGNOSIS — I10 Essential (primary) hypertension: Secondary | ICD-10-CM

## 2020-02-28 ENCOUNTER — Other Ambulatory Visit: Payer: Self-pay | Admitting: Family Medicine

## 2020-02-28 DIAGNOSIS — K51919 Ulcerative colitis, unspecified with unspecified complications: Secondary | ICD-10-CM

## 2020-05-21 ENCOUNTER — Encounter: Payer: Self-pay | Admitting: Gastroenterology

## 2020-05-21 ENCOUNTER — Telehealth: Payer: Self-pay | Admitting: Gastroenterology

## 2020-05-21 ENCOUNTER — Ambulatory Visit (INDEPENDENT_AMBULATORY_CARE_PROVIDER_SITE_OTHER): Payer: Medicare Other | Admitting: Gastroenterology

## 2020-05-21 VITALS — BP 118/62 | HR 111 | Ht 71.0 in | Wt 223.4 lb

## 2020-05-21 DIAGNOSIS — K921 Melena: Secondary | ICD-10-CM

## 2020-05-21 DIAGNOSIS — K5180 Other ulcerative colitis without complications: Secondary | ICD-10-CM | POA: Diagnosis not present

## 2020-05-21 DIAGNOSIS — R197 Diarrhea, unspecified: Secondary | ICD-10-CM | POA: Diagnosis not present

## 2020-05-21 MED ORDER — PREDNISONE 10 MG PO TABS
ORAL_TABLET | ORAL | 0 refills | Status: DC
Start: 2020-05-21 — End: 2020-06-18

## 2020-05-21 NOTE — Telephone Encounter (Signed)
Spoke to patient who reports having UC flare up over the past 2 weeks.Patient will come in at 3pm today for an office visit.

## 2020-05-21 NOTE — Progress Notes (Signed)
P  Chief Complaint:    Ulcerative Colitis, fecal urgency, hematochezia  GI History: 68 year old male with history of left-sided Ulcerative Colitis, previously followed at [facility], for seeing [facility] on 05/17/2019.  IBD History: -Diagnosed approximately 2002 -Previous treatment: Hydrocortisone, Canasa, Rowasa, prednisone for flare.  Previous requested for records from [facility] that were never received -Current treatment: Sulfasalazine, folic acid (since approximately 2010) -EIMs: ?  Ophthalmologic symptoms  IBD Health Care Maintenance: Annual Flu Vaccine - UTD Pneumococcal Vaccine- UTD Derm- Encouraged yearly skin check, sunscreen and a hat DEXA scan if risk factors for osteoporosis -N/A TB testing if on anti-TNF, yearly - N/A Micronutrient eval-ordered today Last Colonoscopy -06/2019 (quiescent disease)  Family history notable for son with UC, treated with Humira.  Endoscopic History: -EGD (03/2007, Dr. Dorrene German): Candida esophagus, normal stomach, normal duodenum.  Normal gastric/duodenal biopsies -Colonoscopy (03/2007, Dr. Dorrene German): Mild colitis/proctitis from 23 cm from anal verge through the rectum, with relative sparing of the distal rectum.  Biopsies with moderate chronic, active colitis.  Small internal hemorrhoids, early sigmoid diverticulosis, otherwise normal colon, normal TI -VCE (04/2007, Dr. Dorrene German): Mild erythema in the duodenal bulb, otherwise normal.  No bleeding.  Good prep, capsule reached the cecum -Colonoscopy (04/2009, Dr. Dorrene German): Active inflammation from hepatic flexure through the rectum without skip areas, characterized by erythema, friability, heme, mucus, edema, exudate.  Relative sparing of the distal rectum thought 2/2 hydrocortisone suppositories, Canasa, Rowasa enemas.  Biopsies with severe active, chronic colitis.  Biopsies of the right colon were normal.  Small internal hemorrhoids.  Normal TI. -Colonoscopy (06/2019, Dr. Bryan Lemma): Hepatic  flexure polyp (TA), sigmoid polyp (TA), normal mucosa cecum 3 descending (biopsies benign), mild active colitis from 35 cm to distal rectum (biopsies: Inactive, quiescent colitis), internal hemorrhoids.  Repeat 2 years  HPI:     Patient is a 69 y.o. male presenting [facility] for follow-up.  Last seen by me in the office 05/17/2019 to establish care.  Colonoscopy in 06/2019 demonstrated an active colitis in the rectum and distal sigmoid colon, with transition to normal mucosa at 35 cm with normal biopsies proximal to that point.  Also with 2 subcentimeter tubular adenomas.  Recommended repeat in 2 years  Today, he states has been having sxs for last 2-3 weeks c/w flare. Having 3-4 nocturnal stoosl and 2-3 during daytime.  Stools are watery and mucus-like, with intermittent episodes of scant BRBPR. No abdominal pain, fever, chills, n/v. Good PO intake. No recent med changes. Last flare ~3+ years ago, responsive to short course of steroids per patient.  Did have tick bite in 11/2019, treated with Doxy.  Otherwise, no change in medications, other antibiotics, travel, etc.  No new imaging or labs for review since last appointment.  Review of systems:     No chest pain, no SOB, no fevers, no urinary sx   Past Medical History:  Diagnosis Date   Anemia    Arthritis    Elevated cholesterol    Hypertension    UC (ulcerative colitis) ([place])     Patient's surgical history, family medical history, social history, medications and allergies were all reviewed in Epic    Current Outpatient Medications  Medication Sig Dispense Refill   amLODipine-valsartan (EXFORGE) 10-320 MG tablet TAKE 1 TABLET BY MOUTH  DAILY 120 tablet 2   chlorthalidone (HYGROTON) 25 MG tablet TAKE 1 TABLET BY MOUTH  DAILY 120 tablet 2   doxycycline (VIBRA-TABS) 100 MG tablet Take 1 tablet (100 mg  total) by mouth 2 (two) times daily. Can give caps or generic 20 tablet 0   Ferrous Sulfate (IRON) 325 (65  Fe) MG TABS Take 1 tablet by mouth daily.     folic acid (FOLVITE) 1 MG tablet TAKE 1 TABLET BY MOUTH  DAILY 120 tablet 2   NON FORMULARY as needed. Lectin shield Take 2 per meal      NON FORMULARY as needed. Vital reds-energy supplement     sulfaSALAzine (AZULFIDINE) 500 MG tablet TAKE 2 TABLETS BY MOUTH 3  TIMES DAILY 720 tablet 2   VITAMIN D, CHOLECALCIFEROL, PO Take 5,000 Units by mouth daily.     No current facility-administered medications for this visit.    Physical Exam:     BP 118/62    Pulse (!) 111    Ht 5' 11"  (1.803 m)    Wt 223 lb 6 oz (101.3 kg)    BMI 31.15 kg/m   GENERAL:  Pleasant male in NAD PSYCH: : Cooperative, normal affect EENT:  conjunctiva pink, mucous membranes moist, neck supple without masses CARDIAC:  RRR, no murmur heard, no peripheral edema PULM: Normal respiratory effort, lungs CTA bilaterally, no wheezing ABDOMEN:  Nondistended, soft, nontender. No obvious masses, no hepatomegaly,  normal bowel sounds SKIN:  turgor, no lesions seen Musculoskeletal:  Normal muscle tone, normal strength NEURO: Alert and oriented x 3, no focal neurologic deficits   IMPRESSION and PLAN:    1) Ulcerative Colitis 69 year old male with previously quiescent left-sided UC, presenting with 2-3-weeks of symptoms c/w previous flare.   - GI PCR panel - ESR, CRP, fecal calprotectin - CBC, BMP - Prednisone 40 mg x2 weeks then taper by 10 mg/week -Continue sulfasalazine.  Depending on response to therapy, briefly discussed escalating medical management - RTC in 8 weeks or sooner prn  I spent 30 minutes of time, including in depth chart review, independent review of results as outlined above, communicating results with the patient directly, face-to-face time with the patient, coordinating care, and ordering studies and medications as appropriate, and documentation.        Lavena Bullion ,DO, FACG 05/21/2020, 3:09 PM

## 2020-05-21 NOTE — Patient Instructions (Signed)
If you are age 69 or older, your body mass index should be between 23-30. Your Body mass index is 31.15 kg/m. If this is out of the aforementioned range listed, please consider follow up with your Primary Care Provider.  If you are age 29 or younger, your body mass index should be between 19-25. Your Body mass index is 31.15 kg/m. If this is out of the aformentioned range listed, please consider follow up with your Primary Care Provider.    We have sent the following medications to your pharmacy for you to pick up at your convenience: Prednisone  Please go next door and have your labwork done  Due to recent changes in healthcare laws, you may see the results of your imaging and laboratory studies on MyChart before your provider has had a chance to review them.  We understand that in some cases there may be results that are confusing or concerning to you. Not all laboratory results come back in the same time frame and the provider may be waiting for multiple results in order to interpret others.  Please give [place] 48 hours in order for your provider to thoroughly review all the results before contacting the office for clarification of your results.

## 2020-06-04 ENCOUNTER — Other Ambulatory Visit (INDEPENDENT_AMBULATORY_CARE_PROVIDER_SITE_OTHER): Payer: Medicare Other

## 2020-06-04 ENCOUNTER — Telehealth: Payer: Self-pay | Admitting: Gastroenterology

## 2020-06-04 DIAGNOSIS — K921 Melena: Secondary | ICD-10-CM | POA: Diagnosis not present

## 2020-06-04 DIAGNOSIS — R197 Diarrhea, unspecified: Secondary | ICD-10-CM

## 2020-06-04 DIAGNOSIS — K5180 Other ulcerative colitis without complications: Secondary | ICD-10-CM

## 2020-06-04 LAB — CBC WITH DIFFERENTIAL/PLATELET
Basophils Absolute: 0 10*3/uL (ref 0.0–0.1)
Basophils Relative: 0.3 % (ref 0.0–3.0)
Eosinophils Absolute: 0 10*3/uL (ref 0.0–0.7)
Eosinophils Relative: 0 % (ref 0.0–5.0)
HCT: 33 % — ABNORMAL LOW (ref 39.0–52.0)
Hemoglobin: 10.9 g/dL — ABNORMAL LOW (ref 13.0–17.0)
Lymphocytes Relative: 9.5 % — ABNORMAL LOW (ref 12.0–46.0)
Lymphs Abs: 1 10*3/uL (ref 0.7–4.0)
MCHC: 32.9 g/dL (ref 30.0–36.0)
MCV: 84.5 fl (ref 78.0–100.0)
Monocytes Absolute: 0.4 10*3/uL (ref 0.1–1.0)
Monocytes Relative: 3.3 % (ref 3.0–12.0)
Neutro Abs: 9.5 10*3/uL — ABNORMAL HIGH (ref 1.4–7.7)
Neutrophils Relative %: 86.9 % — ABNORMAL HIGH (ref 43.0–77.0)
Platelets: 423 10*3/uL — ABNORMAL HIGH (ref 150.0–400.0)
RBC: 3.91 Mil/uL — ABNORMAL LOW (ref 4.22–5.81)
RDW: 15.7 % — ABNORMAL HIGH (ref 11.5–15.5)
WBC: 10.9 10*3/uL — ABNORMAL HIGH (ref 4.0–10.5)

## 2020-06-04 LAB — BASIC METABOLIC PANEL
BUN: 31 mg/dL — ABNORMAL HIGH (ref 6–23)
CO2: 29 mEq/L (ref 19–32)
Calcium: 8.8 mg/dL (ref 8.4–10.5)
Chloride: 92 mEq/L — ABNORMAL LOW (ref 96–112)
Creatinine, Ser: 1.29 mg/dL (ref 0.40–1.50)
GFR: 56.7 mL/min — ABNORMAL LOW (ref >60.00)
Glucose, Bld: 116 mg/dL — ABNORMAL HIGH (ref 70–99)
Potassium: 3.8 mEq/L (ref 3.5–5.1)
Sodium: 132 mEq/L — ABNORMAL LOW (ref 135–145)

## 2020-06-04 LAB — SEDIMENTATION RATE: Sed Rate: 76 mm/hr — ABNORMAL HIGH (ref 0–20)

## 2020-06-04 LAB — C-REACTIVE PROTEIN: CRP: 6.4 mg/dL (ref 0.5–20.0)

## 2020-06-04 NOTE — Telephone Encounter (Signed)
Called [facility] to see if they are able to see our labs from 05/21/2020. Hope checked and yes they are there and the patient will go to [facility] to have labwork performed today

## 2020-06-04 NOTE — Telephone Encounter (Signed)
Gave the patient the address to the [facility] lab and he will go there today and have his labwork completed

## 2020-06-04 NOTE — Telephone Encounter (Signed)
Cardiology lab drew the labs but never received the bloodwork result. They are unable to give [place] a reason why they did not result.

## 2020-06-04 NOTE — Telephone Encounter (Signed)
Pt is requesting a call back from a nurse in regards to his lab results from 05/21/2020  Pt would like to also discuss his prednisone which is not helping him at all.

## 2020-06-05 ENCOUNTER — Other Ambulatory Visit: Payer: Self-pay | Admitting: General Surgery

## 2020-06-05 ENCOUNTER — Other Ambulatory Visit: Payer: Medicare Other

## 2020-06-05 ENCOUNTER — Telehealth: Payer: Self-pay | Admitting: Gastroenterology

## 2020-06-05 DIAGNOSIS — R1013 Epigastric pain: Secondary | ICD-10-CM

## 2020-06-05 DIAGNOSIS — K5180 Other ulcerative colitis without complications: Secondary | ICD-10-CM

## 2020-06-05 DIAGNOSIS — R197 Diarrhea, unspecified: Secondary | ICD-10-CM

## 2020-06-05 DIAGNOSIS — K921 Melena: Secondary | ICD-10-CM

## 2020-06-05 NOTE — Addendum Note (Signed)
Addended by: Lanny Hurst A on: 06/05/2020 11:17 AM   Modules accepted: Orders

## 2020-06-05 NOTE — Addendum Note (Signed)
Addended by: Lanny Hurst A on: 06/05/2020 11:12 AM   Modules accepted: Orders

## 2020-06-05 NOTE — Telephone Encounter (Signed)
Please see separate lab result message that I just forwarded.  He has markers of active inflammation to include elevated ESR, mildly elevated WBC with elevated neutrophils, mildly elevated platelets.  Additionally with elevated BUN/creatinine and slightly low sodium, all consistent with his active GI symptoms.  Stool studies not yet resulted.  -Please inquire if patient has any response to steroids, or if the diarrhea is unchanged or worsening -Encourage continued hydration -If no response to steroids, and if stool studies negative for infection, please tentatively set up for expedited flexible sigmoidoscopy on 06/08/2020 with me 

## 2020-06-05 NOTE — Telephone Encounter (Signed)
Pls call pt. He is eager to hear about his lab results.

## 2020-06-05 NOTE — Telephone Encounter (Signed)
Patients wife calling to follow up on previous message and is requesting a call back today for further recommendations for her husband it in a lot of discomfort

## 2020-06-05 NOTE — Telephone Encounter (Signed)
Spoke to patient who states that he is very anxious about the results on his recent lab results and would like to know what he needs to do next . He continues to have several diarrheal stools daily. I let him know that the stool studies have not been resulted yet, but will look into any additional treatment regarding his blood work.

## 2020-06-06 ENCOUNTER — Other Ambulatory Visit: Payer: Self-pay | Admitting: Gastroenterology

## 2020-06-06 DIAGNOSIS — R197 Diarrhea, unspecified: Secondary | ICD-10-CM

## 2020-06-06 DIAGNOSIS — R1013 Epigastric pain: Secondary | ICD-10-CM

## 2020-06-06 DIAGNOSIS — E860 Dehydration: Secondary | ICD-10-CM

## 2020-06-06 MED ORDER — SODIUM CHLORIDE 0.9 % IV SOLN: Freq: Once | INTRAVENOUS | Status: DC

## 2020-06-06 NOTE — Telephone Encounter (Signed)
Spoke to patient he is scheduled for a Flex Sig on 06/08/20 pending stool studies.

## 2020-06-07 ENCOUNTER — Other Ambulatory Visit: Payer: Self-pay

## 2020-06-07 ENCOUNTER — Telehealth: Payer: Self-pay | Admitting: Gastroenterology

## 2020-06-07 ENCOUNTER — Other Ambulatory Visit: Payer: Self-pay | Admitting: Gastroenterology

## 2020-06-07 ENCOUNTER — Ambulatory Visit (HOSPITAL_COMMUNITY)
Admission: RE | Admit: 2020-06-07 | Discharge: 2020-06-07 | Disposition: A | Discharge status: Home / Self Care | Payer: Medicare Other | Source: Ambulatory Visit | Attending: Internal Medicine | Admitting: Internal Medicine

## 2020-06-07 DIAGNOSIS — A0472 Enterocolitis due to Clostridium difficile, not specified as recurrent: Secondary | ICD-10-CM | POA: Insufficient documentation

## 2020-06-07 DIAGNOSIS — E860 Dehydration: Secondary | ICD-10-CM | POA: Diagnosis not present

## 2020-06-07 MED ORDER — ONDANSETRON 4 MG PO TBDP: 4.0000 mg | ORAL_TABLET | Freq: Four times a day (QID) | ORAL | 0 refills | Status: DC | PRN

## 2020-06-07 MED ORDER — METRONIDAZOLE 500 MG PO TABS: 500.0000 mg | ORAL_TABLET | Freq: Three times a day (TID) | ORAL | 0 refills | Status: AC

## 2020-06-07 MED ORDER — CIPROFLOXACIN HCL 500 MG PO TABS: 500.0000 mg | ORAL_TABLET | Freq: Two times a day (BID) | ORAL | 0 refills | Status: DC

## 2020-06-07 MED ORDER — SODIUM CHLORIDE 0.9 % IV SOLN: Freq: Once | INTRAVENOUS | Status: AC

## 2020-06-07 NOTE — Discharge Instructions (Signed)
Dehydration, Adult Dehydration is condition in which there is not enough water or other fluids in the body. This happens when a person loses more fluids than he or she takes in. Important body parts cannot work right without the right amount of fluids. Any loss of fluids from the body can cause dehydration. Dehydration can be mild, worse, or very bad. It should be treated right away to keep it from getting very bad. What are the causes? This condition may be caused by:  Conditions that cause loss of water or other fluids, such as: ? Watery poop (diarrhea). ? Vomiting. ? Sweating a lot. ? Peeing (urinating) a lot.  Not drinking enough fluids, especially when you: ? Are ill. ? Are doing things that take a lot of energy to do.  Other illnesses and conditions, such as fever or infection.  Certain medicines, such as medicines that take extra fluid out of the body (diuretics).  Lack of safe drinking water.  Not being able to get enough water and food. What increases the risk? The following factors may make you more likely to develop this condition:  Having a long-term (chronic) illness that has not been treated the right way, such as: ? Diabetes. ? Heart disease. ? Kidney disease.  Being 21 years of age or older.  Having a disability.  Living in a place that is high above the ground or sea (high in altitude). The thinner, dried air causes more fluid loss.  Doing exercises that put stress on your body for a long time. What are the signs or symptoms? Symptoms of dehydration depend on how bad it is. Mild or worse dehydration  Thirst.  Dry lips or dry mouth.  Feeling dizzy or light-headed, especially when you stand up from sitting.  Muscle cramps.  Your body making: ? Dark pee (urine). Pee may be the color of tea. ? Less pee than normal. ? Less tears than normal.  Headache. Very bad dehydration  Changes in skin. Skin may: ? Be cold to the touch (clammy). ? Be blotchy  or pale. ? Not go back to normal right after you lightly pinch it and let it go.  Little or no tears, pee, or sweat.  Changes in vital signs, such as: ? Fast breathing. ? Low blood pressure. ? Weak pulse. ? Pulse that is more than 100 beats a minute when you are sitting still.  Other changes, such as: ? Feeling very thirsty. ? Eyes that look hollow (sunken). ? Cold hands and feet. ? Being mixed up (confused). ? Being very tired (lethargic) or having trouble waking from sleep. ? Short-term weight loss. ? Loss of consciousness. How is this treated? Treatment for this condition depends on how bad it is. Treatment should start right away. Do not wait until your condition gets very bad. Very bad dehydration is an emergency. You will need to go to a hospital.  Mild or worse dehydration can be treated at home. You may be asked to: ? Drink more fluids. ? Drink an oral rehydration solution (ORS). This drink helps get the right amounts of fluids and salts and minerals in the blood (electrolytes).  Very bad dehydration can be treated: ? With fluids through an IV tube. ? By getting normal levels of salts and minerals in your blood. This is often done by giving salts and minerals through a tube. The tube is passed through your nose and into your stomach. ? By treating the root cause. Follow these instructions at  home: Oral rehydration solution If told by your doctor, drink an ORS:  Make an ORS. Use instructions on the package.  Start by drinking small amounts, about  cup (120 mL) every 5-10 minutes.  Slowly drink more until you have had the amount that your doctor said to have. Eating and drinking         Drink enough clear fluid to keep your pee pale yellow. If you were told to drink an ORS, finish the ORS first. Then, start slowly drinking other clear fluids. Drink fluids such as: ? Water. Do not drink only water. Doing that can make the salt (sodium) level in your body get too  low. ? Water from ice chips you suck on. ? Fruit juice that you have added water to (diluted). ? Low-calorie sports drinks.  Eat foods that have the right amounts of salts and minerals, such as: ? Bananas. ? Oranges. ? Potatoes. ? Tomatoes. ? Spinach.  Do not drink alcohol.  Avoid: ? Drinks that have a lot of sugar. These include:  High-calorie sports drinks.  Fruit juice that you did not add water to.  Soda.  Caffeine. ? Foods that are greasy or have a lot of fat or sugar. General instructions  Take over-the-counter and prescription medicines only as told by your doctor.  Do not take salt tablets. Doing that can make the salt level in your body get too high.  Return to your normal activities as told by your doctor. Ask your doctor what activities are safe for you.  Keep all follow-up visits as told by your doctor. This is important. Contact a doctor if:  You have pain in your belly (abdomen) and the pain: ? Gets worse. ? Stays in one place.  You have a rash.  You have a stiff neck.  You get angry or annoyed (irritable) more easily than normal.  You are more tired or have a harder time waking than normal.  You feel: ? Weak or dizzy. ? Very thirsty. Get help right away if you have:  Any symptoms of very bad dehydration.  Symptoms of vomiting, such as: ? You cannot eat or drink without vomiting. ? Your vomiting gets worse or does not go away. ? Your vomit has blood or green stuff in it.  Symptoms that get worse with treatment.  A fever.  A very bad headache.  Problems with peeing or pooping (having a bowel movement), such as: ? Watery poop that gets worse or does not go away. ? Blood in your poop (stool). This may cause poop to look black and tarry. ? Not peeing in 6-8 hours. ? Peeing only a small amount of very dark pee in 6-8 hours.  Trouble breathing. These symptoms may be an emergency. Do not wait to see if the symptoms will go away. Get  medical help right away. Call your local emergency services (911 in the [place] Do not drive yourself to the hospital. Summary  Dehydration is a condition in which there is not enough water or other fluids in the body. This happens when a person loses more fluids than he or she takes in.  Treatment for this condition depends on how bad it is. Treatment should be started right away. Do not wait until your condition gets very bad.  Drink enough clear fluid to keep your pee pale yellow. If you were told to drink an oral rehydration solution (ORS), finish the ORS first. Then, start slowly drinking other clear fluids.  Take over-the-counter and prescription medicines only as told by your doctor.  Get help right away if you have any symptoms of very bad dehydration. This information is not intended to replace advice given to you by your health care provider. Make sure you discuss any questions you have with your health care provider. Document Revised: 02/17/2019 Document Reviewed: 02/17/2019 Elsevier Patient Education  [place].

## 2020-06-07 NOTE — Telephone Encounter (Signed)
Spoke to wife to let her know that Dr Bryan Lemma will be calling her to discuss patients next plan of care

## 2020-06-07 NOTE — Telephone Encounter (Signed)
Spoke to patient to inform him that the medication has been sent to his pharmacy. He knows to takethe Zoferan 30 minutes before his antibiotics. All questions answered. Patient voiced understanding.

## 2020-06-07 NOTE — Telephone Encounter (Signed)
Spoke with patient and wife by phone. Ongoing sxs. Unfortuantely GI PCR still not resulted. Increasing concern for infectious etiology. Discussed options to include empiric abx vs sending to ER for admission and expedited w/u. They do not want to go to ER. Plan for the following:  - Cipro 500 mg PO BID x10 days - Flagyl 500 mg PO TID x10 days - Zofran 4 mg PO prn Q6H for nausea. Take 30 minutes prior to Abx - Still tentatively plan for Flex sig tomorrow as scheduled - If any worsening sxs, not tolerating PO, or other concerns, to go to ER tonight - Will evaluate tomorrow in [facility] and further tx with more IVFs as well - Will continue to watch for PCR panel results - Of note, he stopped taking Prednisone 7 days ago due to no perceived benefit  All questions answered and appreciative of phone call.

## 2020-06-07 NOTE — Progress Notes (Signed)
PATIENT CARE CENTER NOTE  Diagnosis: Dehydration    Provider: Gerrit Heck, MD   Procedure: Fluid bolus   Note: Patient received a fluid bolus of 0.9% Sodium Chloride via PIV. Bolus given over 2 hours. Patient tolerated well with no adverse reaction. Pre-infusion BP was soft at 74/50. Post-infusion BP was 99/57. Discharge instructions given. Patient alert, oriented and ambulatory at discharge. Discharged home with wife.

## 2020-06-08 ENCOUNTER — Other Ambulatory Visit: Payer: Self-pay

## 2020-06-08 ENCOUNTER — Encounter (HOSPITAL_BASED_OUTPATIENT_CLINIC_OR_DEPARTMENT_OTHER): Payer: Self-pay

## 2020-06-08 ENCOUNTER — Other Ambulatory Visit: Payer: Self-pay | Admitting: Gastroenterology

## 2020-06-08 ENCOUNTER — Ambulatory Visit: Payer: Medicare Other | Admitting: Gastroenterology

## 2020-06-08 ENCOUNTER — Emergency Department (HOSPITAL_BASED_OUTPATIENT_CLINIC_OR_DEPARTMENT_OTHER)
Admission: EM | Admit: 2020-06-08 | Discharge: 2020-06-08 | Disposition: A | Discharge status: Home / Self Care | Payer: Medicare Other | Attending: Emergency Medicine | Admitting: Emergency Medicine

## 2020-06-08 DIAGNOSIS — I10 Essential (primary) hypertension: Secondary | ICD-10-CM | POA: Diagnosis not present

## 2020-06-08 DIAGNOSIS — N179 Acute kidney failure, unspecified: Secondary | ICD-10-CM | POA: Diagnosis not present

## 2020-06-08 DIAGNOSIS — A0472 Enterocolitis due to Clostridium difficile, not specified as recurrent: Secondary | ICD-10-CM

## 2020-06-08 DIAGNOSIS — E871 Hypo-osmolality and hyponatremia: Secondary | ICD-10-CM | POA: Diagnosis not present

## 2020-06-08 DIAGNOSIS — E860 Dehydration: Secondary | ICD-10-CM

## 2020-06-08 DIAGNOSIS — D649 Anemia, unspecified: Secondary | ICD-10-CM | POA: Diagnosis not present

## 2020-06-08 DIAGNOSIS — Z87891 Personal history of nicotine dependence: Secondary | ICD-10-CM | POA: Diagnosis not present

## 2020-06-08 DIAGNOSIS — Z79899 Other long term (current) drug therapy: Secondary | ICD-10-CM | POA: Insufficient documentation

## 2020-06-08 LAB — COMPREHENSIVE METABOLIC PANEL
ALT: 22 U/L (ref 0–44)
AST: 18 U/L (ref 15–41)
Albumin: 2.7 g/dL — ABNORMAL LOW (ref 3.5–5.0)
Alkaline Phosphatase: 52 U/L (ref 38–126)
Anion gap: 10 (ref 5–15)
BUN: 43 mg/dL — ABNORMAL HIGH (ref 8–23)
CO2: 25 mmol/L (ref 22–32)
Calcium: 7.9 mg/dL — ABNORMAL LOW (ref 8.9–10.3)
Chloride: 93 mmol/L — ABNORMAL LOW (ref 98–111)
Creatinine, Ser: 1.92 mg/dL — ABNORMAL HIGH (ref 0.61–1.24)
GFR, Estimated: 37 mL/min — ABNORMAL LOW (ref >60)
Glucose, Bld: 131 mg/dL — ABNORMAL HIGH (ref 70–99)
Potassium: 3.5 mmol/L (ref 3.5–5.1)
Sodium: 128 mmol/L — ABNORMAL LOW (ref 135–145)
Total Bilirubin: 0.5 mg/dL (ref 0.3–1.2)
Total Protein: 6.2 g/dL — ABNORMAL LOW (ref 6.5–8.1)

## 2020-06-08 LAB — CBC WITH DIFFERENTIAL/PLATELET
Abs Immature Granulocytes: 0.15 10*3/uL — ABNORMAL HIGH (ref 0.00–0.07)
Basophils Absolute: 0.1 10*3/uL (ref 0.0–0.1)
Basophils Relative: 1 %
Eosinophils Absolute: 0.1 10*3/uL (ref 0.0–0.5)
Eosinophils Relative: 1 %
HCT: 30.5 % — ABNORMAL LOW (ref 39.0–52.0)
Hemoglobin: 9.8 g/dL — ABNORMAL LOW (ref 13.0–17.0)
Immature Granulocytes: 1 %
Lymphocytes Relative: 18 %
Lymphs Abs: 2.4 10*3/uL (ref 0.7–4.0)
MCH: 27.5 pg (ref 26.0–34.0)
MCHC: 32.1 g/dL (ref 30.0–36.0)
MCV: 85.7 fL (ref 80.0–100.0)
Monocytes Absolute: 1.4 10*3/uL — ABNORMAL HIGH (ref 0.1–1.0)
Monocytes Relative: 10 %
Neutro Abs: 9.5 10*3/uL — ABNORMAL HIGH (ref 1.7–7.7)
Neutrophils Relative %: 69 %
Platelets: 362 10*3/uL (ref 150–400)
RBC: 3.56 MIL/uL — ABNORMAL LOW (ref 4.22–5.81)
RDW: 15.3 % (ref 11.5–15.5)
Smear Review: NORMAL
WBC: 13.7 10*3/uL — ABNORMAL HIGH (ref 4.0–10.5)
nRBC: 0 % (ref 0.0–0.2)

## 2020-06-08 MED ORDER — SODIUM CHLORIDE 0.9 % IV BOLUS
1000.0000 mL | Freq: Once | INTRAVENOUS | Status: AC
  Administered 2020-06-08: 1000 mL via INTRAVENOUS

## 2020-06-08 MED ORDER — VANCOMYCIN HCL 125 MG PO CAPS: 125.0000 mg | ORAL_CAPSULE | Freq: Four times a day (QID) | ORAL | 0 refills | Status: DC

## 2020-06-08 NOTE — ED Provider Notes (Signed)
[facility] EMERGENCY DEPARTMENT Provider Note   CSN: [id number] Arrival date & time: 06/08/20  1001     History Chief Complaint  Patient presents with  . Diarrhea    Sigfredo Schreier is a 69 y.o. male.  Patient is a 69 year old male who presents requesting IV fluids.  He has presented with watery diarrhea for about the last 3 to 4 weeks.  He has followed by low-power gastroenterology.  He initially was felt to have a flareup of his ulcerative colitis and was treated with a prolonged course of steroids.  His symptoms did not improve.  He recently just got a test back for C. difficile that was positive.  He was started on oral vancomycin today.  He has not yet picked it up from the pharmacy.  He has had some intermittent nausea and vomiting but was able to drink a full protein shake prior to coming here today without vomiting or nausea.  He does not have any abdominal pain.  He got some IV fluids at an infusion center yesterday and felt better.  He felt like today he needed some more IV fluids.  They were able to get him into the infusion center so he came here.  He denies any fevers or other recent illnesses.        Past Medical History:  Diagnosis Date  . Anemia   . Arthritis   . Elevated cholesterol   . Hypertension   . UC (ulcerative colitis) [facility])     Patient Active Problem List   Diagnosis Date Noted  . Obesity (BMI 30-39.9) 07/11/2019  . Ulcerative colitis with complication ([place]) 81/85/6314  . Essential hypertension 08/30/2018    Past Surgical History:  Procedure Laterality Date  . COLONOSCOPY     First done at [facility] around age 21. Dr Dorrene German possibly With [facility] x2. last one done around 2012        Family History  Problem Relation Age of Onset  . Hypertension Mother   . Diabetes Mother   . Alzheimer's disease Mother   . Parkinson's disease Father   . Alzheimer's disease Father   . Colon cancer Neg Hx   . Esophageal cancer Neg  Hx   . Rectal cancer Neg Hx   . Stomach cancer Neg Hx     Social History   Tobacco Use  . Smoking status: Former Research scientist (life sciences)  . Smokeless tobacco: Never Used  Vaping Use  . Vaping Use: Never used  Substance Use Topics  . Alcohol use: Yes    Comment: ocassionally  . Drug use: Not Currently    Home Medications Prior to Admission medications   Medication Sig Start Date End Date Taking? Authorizing Provider  amLODipine-valsartan (EXFORGE) 10-320 MG tablet TAKE 1 TABLET BY MOUTH  DAILY 02/06/20  Yes Wendling, Crosby Oyster, DO  Ferrous Sulfate (IRON) 325 (65 Fe) MG TABS Take 1 tablet by mouth daily.   Yes [provider]  folic acid (FOLVITE) 1 MG tablet TAKE 1 TABLET BY MOUTH  DAILY 02/29/20  Yes Wendling, Crosby Oyster, DO  metroNIDAZOLE (FLAGYL) 500 MG tablet Take 1 tablet (500 mg total) by mouth 3 (three) times daily for 10 days. 06/07/20 06/17/20 Yes Cirigliano, Vito V, DO  VITAMIN D, CHOLECALCIFEROL, PO Take 5,000 Units by mouth daily.   Yes [provider]  chlorthalidone (HYGROTON) 25 MG tablet TAKE 1 TABLET BY MOUTH  DAILY 01/05/20   Shelda Pal, DO  doxycycline (VIBRA-TABS) 100 MG tablet  Take 1 tablet (100 mg total) by mouth 2 (two) times daily. Can give caps or generic 11/30/19   Saguier, Percell Miller, PA-C  NON FORMULARY as needed. Lectin shield Take 2 per meal     [provider]  NON FORMULARY as needed. Vital reds-energy supplement    [provider]  ondansetron (ZOFRAN ODT) 4 MG disintegrating tablet Take 1 tablet (4 mg total) by mouth every 6 (six) hours as needed for nausea or vomiting. tke 30 minutes before antibiotics 06/07/20   Cirigliano, Vito V, DO  predniSONE (DELTASONE) 10 MG tablet Take 4 tablets (40 mg total) by mouth daily with breakfast for 14 days, THEN 3 tablets (30 mg total) daily with breakfast for 7 days, THEN 2 tablets (20 mg total) daily with breakfast for 7 days, THEN 1 tablet (10 mg total) daily with breakfast for 7  days. 05/21/20 06/25/20  Cirigliano, Vito V, DO  sulfaSALAzine (AZULFIDINE) 500 MG tablet TAKE 2 TABLETS BY MOUTH 3  TIMES DAILY 01/02/20   Nani Ravens, Crosby Oyster, DO  vancomycin (VANCOCIN HCL) 125 MG capsule Take 1 capsule (125 mg total) by mouth 4 (four) times daily for 14 days. 06/08/20 06/22/20  Cirigliano, Dominic Pea, DO    Allergies    Patient has no known allergies.  Review of Systems   Review of Systems  Constitutional: Positive for fatigue. Negative for chills, diaphoresis and fever.  HENT: Negative for congestion, rhinorrhea and sneezing.   Eyes: Negative.   Respiratory: Negative for cough, chest tightness and shortness of breath.   Cardiovascular: Negative for chest pain and leg swelling.  Gastrointestinal: Positive for diarrhea, nausea and vomiting. Negative for abdominal pain and blood in stool.  Genitourinary: Negative for difficulty urinating, flank pain, frequency and hematuria.  Musculoskeletal: Negative for arthralgias and back pain.  Skin: Negative for rash.  Neurological: Negative for dizziness, speech difficulty, weakness, numbness and headaches.    Physical Exam Updated Vital Signs BP (!) 120/51 (BP Location: Left Arm)   Pulse 95   Temp 98.1 F (36.7 C) (Oral)   Resp 16   Ht 5' 10"  (1.778 m)   Wt 99.8 kg   SpO2 98%   BMI 31.57 kg/m   Physical Exam Constitutional:      Appearance: He is well-developed.  HENT:     Head: Normocephalic and atraumatic.  Eyes:     Pupils: Pupils are equal, round, and reactive to light.  Cardiovascular:     Rate and Rhythm: Regular rhythm. Tachycardia present.     Heart sounds: Normal heart sounds.     Comments: Mild tachycardia Pulmonary:     Effort: Pulmonary effort is normal. No respiratory distress.     Breath sounds: Normal breath sounds. No wheezing or rales.  Chest:     Chest wall: No tenderness.  Abdominal:     General: Bowel sounds are normal.     Palpations: Abdomen is soft.     Tenderness: There is no abdominal  tenderness. There is no guarding or rebound.  Musculoskeletal:        General: Normal range of motion.     Cervical back: Normal range of motion and neck supple.  Lymphadenopathy:     Cervical: No cervical adenopathy.  Skin:    General: Skin is warm and dry.     Findings: No rash.  Neurological:     Mental Status: He is alert and oriented to person, place, and time.     ED Results / Procedures / Treatments  Labs (all labs ordered are listed, but only abnormal results are displayed) Labs Reviewed  CBC WITH DIFFERENTIAL/PLATELET - Abnormal; Notable for the following components:      Result Value   WBC 13.7 (*)    RBC 3.56 (*)    Hemoglobin 9.8 (*)    HCT 30.5 (*)    Neutro Abs 9.5 (*)    Monocytes Absolute 1.4 (*)    Abs Immature Granulocytes 0.15 (*)    All other components within normal limits  COMPREHENSIVE METABOLIC PANEL - Abnormal; Notable for the following components:   Sodium 128 (*)    Chloride 93 (*)    Glucose, Bld 131 (*)    BUN 43 (*)    Creatinine, Ser 1.92 (*)    Calcium 7.9 (*)    Total Protein 6.2 (*)    Albumin 2.7 (*)    GFR, Estimated 37 (*)    All other components within normal limits    EKG None  Radiology No results found.  Procedures Procedures (including critical care time)  Medications Ordered in ED Medications  sodium chloride 0.9 % bolus 1,000 mL (0 mLs Intravenous Stopped 06/08/20 1154)    ED Course  I have reviewed the triage vital signs and the nursing notes.  Pertinent labs & imaging results that were available during my care of the patient were reviewed by me and considered in my medical decision making (see chart for details).    MDM Rules/Calculators/A&P                          Patient is a 69 year old male who presents requesting IV fluids.  He was recently diagnosed with C. difficile colitis.  He has a prescription for vancomycin that he is supposed to start today.  He does not have any abdominal pain.  No fevers.   He is otherwise well-appearing.  He was mildly tachycardic on arrival with a heart rate of 103.  This normalized with 1 L of IV fluids.  He says he feels better after the IV fluids.  He does have some lab abnormalities.  His sodium is mildly low.  His hemoglobin has dropped slightly since his prior values within the last week.  He currently denies any blood in his stool.  He is not on anticoagulants.  He also has mild elevation in his creatinine.  He has a low calcium level although this may be related to his hypoalbuminemia.  He is adamantly refusing hospitalization.  He otherwise feels fine and wants to go home.  I feel that it is reasonable to give a trial of home care given that he is starting vancomycin today.  I did stress to him that he needs to have his blood work closely followed.  I also sent a message to his gastroenterologist regarding this.  He was given strict return precautions. Final Clinical Impression(s) / ED Diagnoses Final diagnoses:  C. difficile diarrhea  Dehydration  AKI (acute kidney injury) ([place])  Hyponatremia  Anemia, unspecified type    Rx / DC Orders ED Discharge Orders    None       Malvin Johns, MD 06/08/20 1248

## 2020-06-08 NOTE — Discharge Instructions (Addendum)
Start your antibiotics today as directed.  Follow-up with your gastroenterologist as discussed to recheck your blood work.  Return here as needed if you have any worsening symptoms.  Make sure that you are staying hydrated.

## 2020-06-08 NOTE — ED Notes (Signed)
ED Provider at bedside. 

## 2020-06-08 NOTE — Progress Notes (Signed)
Patient arrived today for possible flexible sigmoidoscopy.  GI PCR panel finally resulted early this morning and notable for C. difficile toxin positive.  Decided to modify plan as follows: -Canceled flexible sigmoidoscopy in favor of treatment of infectious colitis/C. difficile colitis -Vancomycin 105 mg p.o. 4 times daily x14 days -Start probiotic and continue at least 3 weeks beyond completion of antibiotic course -Will set up for IV fluids today and repeat BMP with electrolyte repletion as indicated.  Unable to perform in [facility], so patient set up to be done at [facility] to continue Zofran as needed -Continue p.o. intake as tolerated to include continued oral hydration -Follow-up with me in 2-3 weeks in the GI clinic or sooner as needed -Discussed potentially modifying UC regimen.  Has been on sulfasalazine for 15+ years.  Not modified those medications today, but could consider changing to Lialda pending response to above treatment -Discussed this plan with the patient and his wife today who are in agreement.  To call me with any questions or issues

## 2020-06-08 NOTE — ED Triage Notes (Signed)
Pt arrives from providers office today for administration of IV fluids, was informed that he tested positive for C.diff. Has had symptoms for 45 days. Hasnt had treatment yet for C.diff. Feels weak.

## 2020-06-09 LAB — GI PROFILE, STOOL, PCR

## 2020-06-09 LAB — CALPROTECTIN, FECAL: Calprotectin, Fecal: 4124 ug/g — ABNORMAL HIGH (ref 0–120)

## 2020-06-11 ENCOUNTER — Other Ambulatory Visit (INDEPENDENT_AMBULATORY_CARE_PROVIDER_SITE_OTHER): Payer: Medicare Other

## 2020-06-11 DIAGNOSIS — A0472 Enterocolitis due to Clostridium difficile, not specified as recurrent: Secondary | ICD-10-CM | POA: Diagnosis not present

## 2020-06-11 DIAGNOSIS — E860 Dehydration: Secondary | ICD-10-CM

## 2020-06-11 LAB — CBC WITH DIFFERENTIAL/PLATELET
Basophils Absolute: 0.1 10*3/uL (ref 0.0–0.1)
Basophils Relative: 0.9 % (ref 0.0–3.0)
Eosinophils Absolute: 0.3 10*3/uL (ref 0.0–0.7)
Eosinophils Relative: 3.5 % (ref 0.0–5.0)
HCT: 27.8 % — ABNORMAL LOW (ref 39.0–52.0)
Hemoglobin: 9.1 g/dL — ABNORMAL LOW (ref 13.0–17.0)
Lymphocytes Relative: 26.9 % (ref 12.0–46.0)
Lymphs Abs: 2.1 10*3/uL (ref 0.7–4.0)
MCHC: 32.8 g/dL (ref 30.0–36.0)
MCV: 82.9 fl (ref 78.0–100.0)
Monocytes Absolute: 1.2 10*3/uL — ABNORMAL HIGH (ref 0.1–1.0)
Monocytes Relative: 16.1 % — ABNORMAL HIGH (ref 3.0–12.0)
Neutro Abs: 4.1 10*3/uL (ref 1.4–7.7)
Neutrophils Relative %: 52.6 % (ref 43.0–77.0)
Platelets: 345 10*3/uL (ref 150.0–400.0)
RBC: 3.35 Mil/uL — ABNORMAL LOW (ref 4.22–5.81)
RDW: 15.8 % — ABNORMAL HIGH (ref 11.5–15.5)
WBC: 7.7 10*3/uL (ref 4.0–10.5)

## 2020-06-11 LAB — BASIC METABOLIC PANEL
BUN: 15 mg/dL (ref 6–23)
CO2: 27 mEq/L (ref 19–32)
Calcium: 7.9 mg/dL — ABNORMAL LOW (ref 8.4–10.5)
Chloride: 94 mEq/L — ABNORMAL LOW (ref 96–112)
Creatinine, Ser: 1.03 mg/dL (ref 0.40–1.50)
GFR: 74.27 mL/min (ref >60.00)
Glucose, Bld: 122 mg/dL — ABNORMAL HIGH (ref 70–99)
Potassium: 3.5 mEq/L (ref 3.5–5.1)
Sodium: 130 mEq/L — ABNORMAL LOW (ref 135–145)

## 2020-06-12 ENCOUNTER — Telehealth: Payer: Self-pay | Admitting: Gastroenterology

## 2020-06-12 ENCOUNTER — Other Ambulatory Visit: Payer: Self-pay | Admitting: Gastroenterology

## 2020-06-12 DIAGNOSIS — A0472 Enterocolitis due to Clostridium difficile, not specified as recurrent: Secondary | ICD-10-CM

## 2020-06-12 DIAGNOSIS — E860 Dehydration: Secondary | ICD-10-CM

## 2020-06-12 DIAGNOSIS — R197 Diarrhea, unspecified: Secondary | ICD-10-CM

## 2020-06-12 NOTE — Telephone Encounter (Signed)
Spoke to patients wife to let her know that patients blood work has improved and his white blood count is back to normal. He will start taking Imodium AD as directed for the diarrhea. Patient has been on Vancomycin for 4 days now for C-Diff. He is tolerating PO's better and staying hydrated.Patient will contact the office if sx worsen or go to the ED. He will have repeat labs on 06/19/20.

## 2020-06-18 ENCOUNTER — Encounter (HOSPITAL_BASED_OUTPATIENT_CLINIC_OR_DEPARTMENT_OTHER): Payer: Self-pay | Admitting: *Deleted

## 2020-06-18 ENCOUNTER — Inpatient Hospital Stay (HOSPITAL_BASED_OUTPATIENT_CLINIC_OR_DEPARTMENT_OTHER)
Admission: EM | Admit: 2020-06-18 | Discharge: 2020-06-27 | DRG: 872 | Disposition: A | Discharge status: Home / Self Care | Payer: Medicare Other | Attending: Internal Medicine | Admitting: Internal Medicine

## 2020-06-18 ENCOUNTER — Emergency Department (HOSPITAL_BASED_OUTPATIENT_CLINIC_OR_DEPARTMENT_OTHER): Payer: Medicare Other

## 2020-06-18 ENCOUNTER — Other Ambulatory Visit: Payer: Self-pay

## 2020-06-18 DIAGNOSIS — K5150 Left sided colitis without complications: Secondary | ICD-10-CM | POA: Diagnosis present

## 2020-06-18 DIAGNOSIS — E669 Obesity, unspecified: Secondary | ICD-10-CM | POA: Diagnosis present

## 2020-06-18 DIAGNOSIS — K6389 Other specified diseases of intestine: Secondary | ICD-10-CM | POA: Diagnosis present

## 2020-06-18 DIAGNOSIS — D75839 Thrombocytosis, unspecified: Secondary | ICD-10-CM | POA: Diagnosis present

## 2020-06-18 DIAGNOSIS — Z87891 Personal history of nicotine dependence: Secondary | ICD-10-CM | POA: Diagnosis not present

## 2020-06-18 DIAGNOSIS — I82441 Acute embolism and thrombosis of right tibial vein: Secondary | ICD-10-CM | POA: Diagnosis present

## 2020-06-18 DIAGNOSIS — K567 Ileus, unspecified: Secondary | ICD-10-CM | POA: Diagnosis present

## 2020-06-18 DIAGNOSIS — I10 Essential (primary) hypertension: Secondary | ICD-10-CM | POA: Diagnosis present

## 2020-06-18 DIAGNOSIS — M7989 Other specified soft tissue disorders: Secondary | ICD-10-CM | POA: Diagnosis not present

## 2020-06-18 DIAGNOSIS — A0472 Enterocolitis due to Clostridium difficile, not specified as recurrent: Secondary | ICD-10-CM | POA: Diagnosis present

## 2020-06-18 DIAGNOSIS — E8809 Other disorders of plasma-protein metabolism, not elsewhere classified: Secondary | ICD-10-CM | POA: Diagnosis present

## 2020-06-18 DIAGNOSIS — Z833 Family history of diabetes mellitus: Secondary | ICD-10-CM | POA: Diagnosis not present

## 2020-06-18 DIAGNOSIS — D638 Anemia in other chronic diseases classified elsewhere: Secondary | ICD-10-CM | POA: Diagnosis present

## 2020-06-18 DIAGNOSIS — Z79899 Other long term (current) drug therapy: Secondary | ICD-10-CM

## 2020-06-18 DIAGNOSIS — Z8249 Family history of ischemic heart disease and other diseases of the circulatory system: Secondary | ICD-10-CM

## 2020-06-18 DIAGNOSIS — I82409 Acute embolism and thrombosis of unspecified deep veins of unspecified lower extremity: Secondary | ICD-10-CM

## 2020-06-18 DIAGNOSIS — D72829 Elevated white blood cell count, unspecified: Secondary | ICD-10-CM | POA: Diagnosis not present

## 2020-06-18 DIAGNOSIS — E7800 Pure hypercholesterolemia, unspecified: Secondary | ICD-10-CM | POA: Diagnosis present

## 2020-06-18 DIAGNOSIS — K529 Noninfective gastroenteritis and colitis, unspecified: Secondary | ICD-10-CM | POA: Diagnosis not present

## 2020-06-18 DIAGNOSIS — E871 Hypo-osmolality and hyponatremia: Secondary | ICD-10-CM | POA: Diagnosis present

## 2020-06-18 DIAGNOSIS — Z09 Encounter for follow-up examination after completed treatment for conditions other than malignant neoplasm: Secondary | ICD-10-CM

## 2020-06-18 DIAGNOSIS — R140 Abdominal distension (gaseous): Secondary | ICD-10-CM | POA: Insufficient documentation

## 2020-06-18 DIAGNOSIS — K56609 Unspecified intestinal obstruction, unspecified as to partial versus complete obstruction: Secondary | ICD-10-CM

## 2020-06-18 DIAGNOSIS — D649 Anemia, unspecified: Secondary | ICD-10-CM | POA: Diagnosis present

## 2020-06-18 DIAGNOSIS — Z6830 Body mass index (BMI) 30.0-30.9, adult: Secondary | ICD-10-CM

## 2020-06-18 DIAGNOSIS — E611 Iron deficiency: Secondary | ICD-10-CM | POA: Diagnosis present

## 2020-06-18 DIAGNOSIS — A419 Sepsis, unspecified organism: Secondary | ICD-10-CM | POA: Diagnosis not present

## 2020-06-18 DIAGNOSIS — Z820 Family history of epilepsy and other diseases of the nervous system: Secondary | ICD-10-CM

## 2020-06-18 DIAGNOSIS — A414 Sepsis due to anaerobes: Secondary | ICD-10-CM | POA: Diagnosis present

## 2020-06-18 DIAGNOSIS — D509 Iron deficiency anemia, unspecified: Secondary | ICD-10-CM | POA: Diagnosis not present

## 2020-06-18 DIAGNOSIS — K51019 Ulcerative (chronic) pancolitis with unspecified complications: Secondary | ICD-10-CM | POA: Diagnosis not present

## 2020-06-18 DIAGNOSIS — Z20822 Contact with and (suspected) exposure to covid-19: Secondary | ICD-10-CM | POA: Diagnosis present

## 2020-06-18 DIAGNOSIS — K51519 Left sided colitis with unspecified complications: Secondary | ICD-10-CM | POA: Diagnosis not present

## 2020-06-18 DIAGNOSIS — K51919 Ulcerative colitis, unspecified with unspecified complications: Secondary | ICD-10-CM | POA: Diagnosis not present

## 2020-06-18 DIAGNOSIS — K429 Umbilical hernia without obstruction or gangrene: Secondary | ICD-10-CM | POA: Diagnosis present

## 2020-06-18 DIAGNOSIS — D62 Acute posthemorrhagic anemia: Secondary | ICD-10-CM | POA: Diagnosis present

## 2020-06-18 DIAGNOSIS — R600 Localized edema: Secondary | ICD-10-CM | POA: Diagnosis not present

## 2020-06-18 DIAGNOSIS — R933 Abnormal findings on diagnostic imaging of other parts of digestive tract: Secondary | ICD-10-CM | POA: Diagnosis not present

## 2020-06-18 DIAGNOSIS — R609 Edema, unspecified: Secondary | ICD-10-CM | POA: Diagnosis not present

## 2020-06-18 LAB — CBC
HCT: 26.1 % — ABNORMAL LOW (ref 39.0–52.0)
Hemoglobin: 8.2 g/dL — ABNORMAL LOW (ref 13.0–17.0)
MCH: 26.7 pg (ref 26.0–34.0)
MCHC: 31.4 g/dL (ref 30.0–36.0)
MCV: 85 fL (ref 80.0–100.0)
Platelets: 513 10*3/uL — ABNORMAL HIGH (ref 150–400)
RBC: 3.07 MIL/uL — ABNORMAL LOW (ref 4.22–5.81)
RDW: 15.5 % (ref 11.5–15.5)
WBC: 12.6 10*3/uL — ABNORMAL HIGH (ref 4.0–10.5)
nRBC: 0.2 % (ref 0.0–0.2)

## 2020-06-18 LAB — COMPREHENSIVE METABOLIC PANEL
ALT: 54 U/L — ABNORMAL HIGH (ref 0–44)
AST: 51 U/L — ABNORMAL HIGH (ref 15–41)
Albumin: 2 g/dL — ABNORMAL LOW (ref 3.5–5.0)
Alkaline Phosphatase: 57 U/L (ref 38–126)
Anion gap: 10 (ref 5–15)
BUN: 22 mg/dL (ref 8–23)
CO2: 26 mmol/L (ref 22–32)
Calcium: 8 mg/dL — ABNORMAL LOW (ref 8.9–10.3)
Chloride: 92 mmol/L — ABNORMAL LOW (ref 98–111)
Creatinine, Ser: 1.13 mg/dL (ref 0.61–1.24)
GFR, Estimated: >60 mL/min (ref >60)
Glucose, Bld: 126 mg/dL — ABNORMAL HIGH (ref 70–99)
Potassium: 4.5 mmol/L (ref 3.5–5.1)
Sodium: 128 mmol/L — ABNORMAL LOW (ref 135–145)
Total Bilirubin: 0.2 mg/dL — ABNORMAL LOW (ref 0.3–1.2)
Total Protein: 5.9 g/dL — ABNORMAL LOW (ref 6.5–8.1)

## 2020-06-18 LAB — RESP PANEL BY RT-PCR (FLU A&B, COVID) ARPGX2
Influenza A by PCR: NEGATIVE
Influenza B by PCR: NEGATIVE
SARS Coronavirus 2 by RT PCR: NEGATIVE

## 2020-06-18 LAB — LACTIC ACID, PLASMA: Lactic Acid, Venous: 0.9 mmol/L (ref 0.5–1.9)

## 2020-06-18 LAB — SEDIMENTATION RATE: Sed Rate: 115 mm/hr — ABNORMAL HIGH (ref 0–16)

## 2020-06-18 LAB — LIPASE, BLOOD: Lipase: 23 U/L (ref 11–51)

## 2020-06-18 LAB — C-REACTIVE PROTEIN: CRP: 12.3 mg/dL — ABNORMAL HIGH (ref <1.0)

## 2020-06-18 MED ORDER — ONDANSETRON HCL 4 MG/2ML IJ SOLN
4.0000 mg | Freq: Four times a day (QID) | INTRAMUSCULAR | Status: DC | PRN
  Administered 2020-06-22 – 2020-06-27 (×14): 4 mg via INTRAVENOUS
  Filled 2020-06-18 (×14): qty 2

## 2020-06-18 MED ORDER — VANCOMYCIN 50 MG/ML ORAL SOLUTION
500.0000 mg | Freq: Four times a day (QID) | ORAL | Status: DC
  Administered 2020-06-18 – 2020-06-19 (×3): 500 mg via ORAL
  Filled 2020-06-18 (×5): qty 10

## 2020-06-18 MED ORDER — ONDANSETRON HCL 4 MG PO TABS
4.0000 mg | ORAL_TABLET | Freq: Four times a day (QID) | ORAL | Status: DC | PRN
  Administered 2020-06-23 – 2020-06-24 (×2): 4 mg via ORAL
  Filled 2020-06-18 (×2): qty 1

## 2020-06-18 MED ORDER — ACETAMINOPHEN 650 MG RE SUPP: 650.0000 mg | Freq: Four times a day (QID) | RECTAL | Status: DC | PRN

## 2020-06-18 MED ORDER — IOHEXOL 300 MG/ML  SOLN
100.0000 mL | Freq: Once | INTRAMUSCULAR | Status: AC | PRN
  Administered 2020-06-18: 100 mL via INTRAVENOUS

## 2020-06-18 MED ORDER — METRONIDAZOLE IN NACL 5-0.79 MG/ML-% IV SOLN
500.0000 mg | Freq: Three times a day (TID) | INTRAVENOUS | Status: DC
  Administered 2020-06-18 – 2020-06-19 (×3): 500 mg via INTRAVENOUS
  Filled 2020-06-18 (×3): qty 100

## 2020-06-18 MED ORDER — SODIUM CHLORIDE 0.9 % IV SOLN: INTRAVENOUS | Status: DC

## 2020-06-18 MED ORDER — SODIUM CHLORIDE 0.9 % IV BOLUS
1000.0000 mL | Freq: Once | INTRAVENOUS | Status: AC
  Administered 2020-06-18: 1000 mL via INTRAVENOUS

## 2020-06-18 MED ORDER — ACETAMINOPHEN 325 MG PO TABS
650.0000 mg | ORAL_TABLET | Freq: Four times a day (QID) | ORAL | Status: DC | PRN
  Administered 2020-06-18 – 2020-06-19 (×2): 650 mg via ORAL
  Filled 2020-06-18 (×2): qty 2

## 2020-06-18 NOTE — Progress Notes (Signed)
   06/18/20 1815  Assess: MEWS Score  Temp (!) 100.5 F (38.1 C)  BP (!) 122/58  Pulse Rate (!) 108  Resp 16  Level of Consciousness Alert  SpO2 98 %  O2 Device Room Air  Assess: if the MEWS score is Yellow or Red  Were vital signs taken at a resting state? Yes  Focused Assessment No change from prior assessment  Early Detection of Sepsis Score *See Row Information* High  MEWS guidelines implemented *See Row Information* Yes  Treat  MEWS Interventions Escalated (See documentation below)  Take Vital Signs  Increase Vital Sign Frequency  Yellow: Q 2hr X 2 then Q 4hr X 2, if remains yellow, continue Q 4hrs  Escalate  MEWS: Escalate Yellow: discuss with charge nurse/RN and consider discussing with provider and RRT  Notify: Charge Nurse/RN  Name of Charge Nurse/RN Notified [place] RN   Date Charge Nurse/RN Notified 06/18/20  Time Charge Nurse/RN Notified 1817  Document  Patient Outcome Other (Comment) (MD paged regarding 100.5 temperature )  Progress note created (see row info) Yes

## 2020-06-18 NOTE — ED Notes (Signed)
Attempt to call report to receiving unit, nurse unavailable at this time, message left to return call.  [facility] enroute ETA [id number]

## 2020-06-18 NOTE — ED Notes (Signed)
Patient transported to CT 

## 2020-06-18 NOTE — ED Notes (Signed)
Report provided to [facility] for transport

## 2020-06-18 NOTE — ED Provider Notes (Signed)
[facility] EMERGENCY DEPARTMENT Provider Note   CSN: [id number] Arrival date & time: 06/18/20  1122     History No chief complaint on file.   Justin Rosales is a 69 y.o. male.  HPI 69 year old male with a history of anemia, arthritis, ulcerative colitis, hypertension presents to the ER and by Dr. Lyndel Safe with [facility] for likely failure of oral vancomycin treatment in the setting of C. difficile.  Patient was diagnosed with C. difficile over a week ago, has been taking oral vancomycin as directed for approximately 8 days now.  He continues to have watery diarrhea with intermittent bleeding, nausea, nonbloody vomiting, poor p.o. intake, feeling weak.  He has had intermittent fevers as high as 101.  He was seen in the office today and sent to the ER with likely plan to admit for further inpatient management.      Past Medical History:  Diagnosis Date  . Anemia   . Arthritis   . Elevated cholesterol   . Hypertension   . UC (ulcerative colitis) [facility])     Patient Active Problem List   Diagnosis Date Noted  . C. difficile colitis 06/18/2020  . Obesity (BMI 30-39.9) 07/11/2019  . Ulcerative colitis with complication ([place]) 48/88/9169  . Essential hypertension 08/30/2018    Past Surgical History:  Procedure Laterality Date  . COLONOSCOPY     First done at [facility] around age 78. Dr Dorrene German possibly With [facility] x2. last one done around 2012        Family History  Problem Relation Age of Onset  . Hypertension Mother   . Diabetes Mother   . Alzheimer's disease Mother   . Parkinson's disease Father   . Alzheimer's disease Father   . Colon cancer Neg Hx   . Esophageal cancer Neg Hx   . Rectal cancer Neg Hx   . Stomach cancer Neg Hx     Social History   Tobacco Use  . Smoking status: Former Research scientist (life sciences)  . Smokeless tobacco: Never Used  Vaping Use  . Vaping Use: Never used  Substance Use Topics  . Alcohol use: Yes    Comment: ocassionally  .  Drug use: Not Currently    Home Medications Prior to Admission medications   Medication Sig Start Date End Date Taking? Authorizing Provider  amLODipine-valsartan (EXFORGE) 10-320 MG tablet TAKE 1 TABLET BY MOUTH  DAILY 02/06/20  Yes Wendling, Crosby Oyster, DO  vancomycin (VANCOCIN HCL) 125 MG capsule Take 1 capsule (125 mg total) by mouth 4 (four) times daily for 14 days. 06/08/20 06/22/20 Yes Cirigliano, Vito V, DO  VITAMIN D, CHOLECALCIFEROL, PO Take 5,000 Units by mouth daily.   Yes [provider]  chlorthalidone (HYGROTON) 25 MG tablet TAKE 1 TABLET BY MOUTH  DAILY 01/05/20   Wendling, Crosby Oyster, DO  doxycycline (VIBRA-TABS) 100 MG tablet Take 1 tablet (100 mg total) by mouth 2 (two) times daily. Can give caps or generic 11/30/19   Saguier, Percell Miller, PA-C  Ferrous Sulfate (IRON) 325 (65 Fe) MG TABS Take 1 tablet by mouth daily.    [provider]  folic acid (FOLVITE) 1 MG tablet TAKE 1 TABLET BY MOUTH  DAILY 02/29/20   Shelda Pal, DO  NON FORMULARY as needed. Lectin shield Take 2 per meal     [provider]  NON FORMULARY as needed. Vital reds-energy supplement    [provider]  ondansetron (ZOFRAN ODT) 4 MG disintegrating tablet Take 1 tablet (4 mg  total) by mouth every 6 (six) hours as needed for nausea or vomiting. tke 30 minutes before antibiotics 06/07/20   Cirigliano, Vito V, DO  predniSONE (DELTASONE) 10 MG tablet Take 4 tablets (40 mg total) by mouth daily with breakfast for 14 days, THEN 3 tablets (30 mg total) daily with breakfast for 7 days, THEN 2 tablets (20 mg total) daily with breakfast for 7 days, THEN 1 tablet (10 mg total) daily with breakfast for 7 days. 05/21/20 06/25/20  Cirigliano, Vito V, DO  sulfaSALAzine (AZULFIDINE) 500 MG tablet TAKE 2 TABLETS BY MOUTH 3  TIMES DAILY 01/02/20   Shelda Pal, DO    Allergies    Patient has no known allergies.  Review of Systems   Review of Systems  Constitutional:  Positive for fever. Negative for chills.  HENT: Negative for ear pain and sore throat.   Eyes: Negative for pain and visual disturbance.  Respiratory: Negative for cough and shortness of breath.   Cardiovascular: Negative for chest pain and palpitations.  Gastrointestinal: Positive for abdominal pain, blood in stool, diarrhea, nausea and vomiting.  Genitourinary: Negative for dysuria and hematuria.  Musculoskeletal: Negative for arthralgias and back pain.  Skin: Negative for color change and rash.  Neurological: Negative for seizures and syncope.  All other systems reviewed and are negative.   Physical Exam Updated Vital Signs BP (!) 118/51   Pulse (!) 111   Temp 98.6 F (37 C) (Oral)   Resp (!) 24   Ht 5' 10"  (1.778 m)   Wt 99.8 kg   SpO2 97%   BMI 31.57 kg/m   Physical Exam Vitals and nursing note reviewed.  Constitutional:      Appearance: He is well-developed. He is ill-appearing.     Comments: Pale appearing  HENT:     Head: Normocephalic and atraumatic.     Mouth/Throat:     Mouth: Mucous membranes are moist.  Eyes:     Conjunctiva/sclera: Conjunctivae normal.     Pupils: Pupils are equal, round, and reactive to light.  Cardiovascular:     Rate and Rhythm: Normal rate and regular rhythm.     Heart sounds: No murmur heard.   Pulmonary:     Effort: Pulmonary effort is normal. No respiratory distress.     Breath sounds: Normal breath sounds.  Abdominal:     General: There is distension.     Palpations: Abdomen is soft.     Tenderness: There is no abdominal tenderness. There is no right CVA tenderness, left CVA tenderness or guarding.  Musculoskeletal:        General: Normal range of motion.     Cervical back: Normal range of motion and neck supple.  Skin:    General: Skin is warm and dry.     Capillary Refill: Capillary refill takes less than 2 seconds.  Neurological:     General: No focal deficit present.     Mental Status: He is alert and oriented to  person, place, and time.     Sensory: No sensory deficit.     Motor: No weakness.  Psychiatric:        Mood and Affect: Mood normal.        Behavior: Behavior normal.     ED Results / Procedures / Treatments   Labs (all labs ordered are listed, but only abnormal results are displayed) Labs Reviewed  COMPREHENSIVE METABOLIC PANEL - Abnormal; Notable for the following components:      Result Value  Sodium 128 (*)    Chloride 92 (*)    Glucose, Bld 126 (*)    Calcium 8.0 (*)    Total Protein 5.9 (*)    Albumin 2.0 (*)    AST 51 (*)    ALT 54 (*)    Total Bilirubin 0.2 (*)    All other components within normal limits  CBC - Abnormal; Notable for the following components:   WBC 12.6 (*)    RBC 3.07 (*)    Hemoglobin 8.2 (*)    HCT 26.1 (*)    Platelets 513 (*)    All other components within normal limits  RESP PANEL BY RT-PCR (FLU A&B, COVID) ARPGX2  CULTURE, BLOOD (ROUTINE X 2)  CULTURE, BLOOD (ROUTINE X 2)  LIPASE, BLOOD  LACTIC ACID, PLASMA  URINALYSIS, ROUTINE W REFLEX MICROSCOPIC    EKG EKG Interpretation  Date/Time:  Monday June 18 2020 13:30:48 EST Ventricular Rate:  111 PR Interval:    QRS Duration: 77 QT Interval:  307 QTC Calculation: 418 R Axis:   61 Text Interpretation: Sinus tachycardia No previous ECGs available Confirmed by Fredia Sorrow [phone]) on 06/18/2020 2:03:38 PM   Radiology CT ABDOMEN PELVIS W CONTRAST  Result Date: 06/18/2020 CLINICAL DATA:  Diarrhea.  History of ulcerative colitis. EXAM: CT ABDOMEN AND PELVIS WITH CONTRAST TECHNIQUE: Multidetector CT imaging of the abdomen and pelvis was performed using the standard protocol following bolus administration of intravenous contrast. CONTRAST:  124m OMNIPAQUE IOHEXOL 300 MG/ML  SOLN COMPARISON:  No comparison studies available. FINDINGS: Lower chest: Unremarkable. Hepatobiliary: No suspicious focal abnormality within the liver parenchyma. There is no evidence for gallstones,  gallbladder wall thickening, or pericholecystic fluid. No intrahepatic or extrahepatic biliary dilation. Pancreas: No focal mass lesion. No dilatation of the main duct. No intraparenchymal cyst. No peripancreatic edema. Spleen: No splenomegaly. No focal mass lesion. Adrenals/Urinary Tract: No adrenal nodule or mass. Exophytic low-density lesions lower pole right kidney are compatible with cyst. 5.6 cm low-density lesion lower interpolar left kidney is compatible with a cyst. Other scattered left renal cysts are associated with tiny left renal lesions too small to characterize but likely benign. No evidence for hydroureter. The urinary bladder appears normal for the degree of distention. Stomach/Bowel: Stomach is unremarkable. No gastric wall thickening. No evidence of outlet obstruction. Duodenum is normally positioned as is the ligament of Treitz. No small bowel wall thickening. No small bowel dilatation. The terminal ileum is normal. The appendix is normal. Right colon and hepatic flexure unremarkable. Left colon is diffusely distended and featureless with mild circumferential wall thickening. Abnormal segment of colon starts in the distal transverse colon through the splenic flexure down to the rectum. Left colonic wall is ill-defined and there is subtle pericolonic edema/inflammation. Vascularity of the left colon is engorged. Vascular/Lymphatic: There is abdominal aortic atherosclerosis without aneurysm. There is no gastrohepatic or hepatoduodenal ligament lymphadenopathy. No retroperitoneal or mesenteric lymphadenopathy. No pelvic sidewall lymphadenopathy. Reproductive: The prostate gland and seminal vesicles are unremarkable. Other: No intraperitoneal free fluid. Musculoskeletal: No worrisome lytic or sclerotic osseous abnormality. IMPRESSION: 1. Left colon is diffusely distended and featureless with mild circumferential wall thickening starting the distal transverse colon through the splenic flexure down to  the rectum mild associated pericolonic edema/inflammation. Imaging features are compatible with infectious/inflammatory colitis 2. Bilateral renal cysts. 3. Aortic Atherosclerosis (ICD10-I70.0). Electronically Signed   By: EMisty StanleyM.D.   On: 06/18/2020 15:02    Procedures Procedures (including critical care time)  Medications Ordered in  ED Medications  vancomycin (VANCOCIN) 50 mg/mL oral solution 500 mg (has no administration in time range)  metroNIDAZOLE (FLAGYL) IVPB 500 mg (500 mg Intravenous New Bag/Given 06/18/20 1612)  sodium chloride 0.9 % bolus 1,000 mL (0 mLs Intravenous Stopped 06/18/20 1514)  iohexol (OMNIPAQUE) 300 MG/ML solution 100 mL (100 mLs Intravenous Contrast Given 06/18/20 1423)    ED Course  I have reviewed the triage vital signs and the nursing notes.  Pertinent labs & imaging results that were available during my care of the patient were reviewed by me and considered in my medical decision making (see chart for details).    MDM Rules/Calculators/A&P                         69 year old male who presents with continuous diarrhea in the setting of C. difficile.  On presentation, he is ill-appearing, pale, however no acute distress.  Vitals on arrival with soft BPs, mild tachycardia 104, not tachypneic on my exam.  Oxygen saturations in the upper 90s.  Physical exam with slightly distended abdomen, however nontender.  Labs reviewed and interpreted by myself -CBC with a leukocytosis of 12.6, elevated from 7 days ago.  Hemoglobin is down almost one-point from 9.1-8.2 today.  Platelets of 513 -CMP with a hyponatremia of 128, likely secondary to diarrhea.  Mild transaminitis noted mild hypocalcemia of 8.  Normal creatinine. -Lactic acid is normal -Blood cultures pending -Lipase is normal -Covid test is negative  Patient was started on IV fluids secondary to mild hypotension and evidence of hyponatremia and dehydration.  CT of the abdomen was ordered to rule out  toxic megacolon, perforated ulcer, or any other explanations for his diarrhea.  I personally reviewed the imaging along with radiology, CT scan consistent with colitis.  No other evidence of intra-abdominal process.  Consulted Dr. Renne Crigler with the hospitalist team at [facility] long, patient will require further admission for treatment of his C. difficile as he has failed outpatient treatment.  As per his request, will start on IV Flagyl, and give 500 mg of oral vancomycin.  Findings and plan discussed with the patient and his wife who are understanding and agreeable.  Remains hemodynamically stable here at this time.  Patient was seen and evaluated by Dr. Bobby Rumpf who is agreeable to the above plan and disposition  Final Clinical Impression(s) / ED Diagnoses Final diagnoses:  Clostridium difficile colitis    Rx / DC Orders ED Discharge Orders    None       Garald Balding, PA-C 06/18/20 1634    Fredia Sorrow, MD 06/19/20 [phone]

## 2020-06-18 NOTE — ED Notes (Signed)
Pt aware of need for urine specimen collection, unable to provide at this time, IVF infusing.  Bedside commode provided

## 2020-06-18 NOTE — ED Provider Notes (Signed)
Medical screening examination/treatment/procedure(s) were conducted as a shared visit with non-physician practitioner(s) and myself.  I personally evaluated the patient during the encounter.  EKG Interpretation  Date/Time:  Monday June 18 2020 13:30:48 EST Ventricular Rate:  111 PR Interval:    QRS Duration: 77 QT Interval:  307 QTC Calculation: 418 R Axis:   61 Text Interpretation: Sinus tachycardia No previous ECGs available Confirmed by Fredia Sorrow [phone]) on 06/18/2020 2:03:38 PM   Patient seen by me along with physician assistant.  Patient sent down from [facility].  Patient's been struggling with some abdominal problems and diarrhea for about 6 weeks.  He has been oral on oral vancomycin for several days approximately 10 days continue to have diarrhea.  Not bloody now but at times there has been some blood in it.  But not recently.  Also reporting fevers at times.  Here today heart rates around 110.  Respirations are [place] rate 18 blood pressures are 95 systolic to 080 systolic.  Oxygen saturations around 96 to 97%.  Patient pale in appearance.  Abdomen soft nontender.  Will get CT scan of the abdomen just to rule out any other problems related to the diarrhea.  Then will speak with [facility] regarding what antibiotics they want to use IV for inpatient admission.  Patient will most likely require inpatient admission to the hospitalist service.  Patient is pale in appearance hemoglobin is at 8.2.  White blood cell count is 12,000.  Electrolytes significant for sodium of 128 chloride 92 however BUN and creatinine is normal at 22 and 1.13.  Liver function test without significant abnormalities.     Fredia Sorrow, MD 06/18/20 1454

## 2020-06-18 NOTE — ED Triage Notes (Signed)
He was diagnosed with cdiff 10 days ago. Here today with continued diarrhea. Weakness. He has been taking an antibiotic for over a week.

## 2020-06-18 NOTE — Telephone Encounter (Signed)
Spoke to patients wife this morning who reports that over the weekend patient developed a fever of 101. He is on oral Vancomycin for C-Diff started 06/08/20.His fever stays around 99.5 while on Tylenol,but if he misses a dose his fever will go up to 101 degrees. This has been ongoing since Saturday. Patient continues to have diarrhea every 3 hours,he is nauseas without vomiting. Has a very poor fluid on food intake,and is very weak feeling.Patients wife was advised that he should go to the ED for fluids and may need a new course of IV antibiotics as he does not seem to be responding to PO antibiotics. The wife was requesting to start off at the [facility] for fluids and treatment. If needs to be a direct admit to [facility] then they will wait for an available bed  rather then spending hours in the ED at [facility]. The wife is aware of the transportation cost if he needs to be transferred. She agreed to start at [facility] for treatment and transfer to [facility] if admission is required. All questions answered. The wife voiced understanding.

## 2020-06-18 NOTE — H&P (Signed)
History and Physical    Justin Rosales TIW:[id number] DOB: 1951/04/17 DOA: 06/18/2020  PCP: Shelda Pal, DO  Patient coming from: Home  I have personally briefly reviewed patient's old medical records in [place]  Chief Complaint: C.Diff  HPI: Justin Rosales is a 69 y.o. male with medical history significant of UC, HTN.  Pt with diarrhea for past couple of weeks, diagnosed with C. Diff based on GI pathogen pnl on 11/16.  Treatment with PO vanc started 11/19.  Diarrhea initially bloody, now brown.  Diarrhea has persisted since starting treatment.  Continues to have intermittent fevers.  BPs running on low side despite not taking BP meds.  Back in to ED today.   ED Course: Tm 100.5, BP 25-053 systolic.  Sodium 128.  WBC 12.6.  COVID neg.  CT abd pelvis shows colitis.  Pt started on high dose PO vanc and IV flagyl, and sent for admission.   Review of Systems: As per HPI, otherwise all review of systems negative.  Past Medical History:  Diagnosis Date  . Anemia   . Arthritis   . Elevated cholesterol   . Hypertension   . UC (ulcerative colitis) [facility])     Past Surgical History:  Procedure Laterality Date  . COLONOSCOPY     First done at [facility] around age 42. Dr Dorrene German possibly With [facility] x2. last one done around 2012      reports that he has quit smoking. He has never used smokeless tobacco. He reports current alcohol use. He reports previous drug use.  No Known Allergies  Family History  Problem Relation Age of Onset  . Hypertension Mother   . Diabetes Mother   . Alzheimer's disease Mother   . Parkinson's disease Father   . Alzheimer's disease Father   . Colon cancer Neg Hx   . Esophageal cancer Neg Hx   . Rectal cancer Neg Hx   . Stomach cancer Neg Hx      Prior to Admission medications   Medication Sig Start Date End Date Taking? Authorizing Provider  amLODipine-valsartan (EXFORGE) 10-320 MG tablet TAKE 1 TABLET BY  MOUTH  DAILY 02/06/20  Yes Wendling, Crosby Oyster, DO  Saccharomyces boulardii (FLORASTOR PO) Take 1 capsule by mouth in the morning and at bedtime.   Yes [provider]  vancomycin (VANCOCIN HCL) 125 MG capsule Take 1 capsule (125 mg total) by mouth 4 (four) times daily for 14 days. 06/08/20 06/22/20 Yes Cirigliano, Vito V, DO  VITAMIN D, CHOLECALCIFEROL, PO Take 5,000 Units by mouth daily.   Yes [provider]  chlorthalidone (HYGROTON) 25 MG tablet TAKE 1 TABLET BY MOUTH  DAILY 01/05/20   Nani Ravens, Crosby Oyster, DO  Ferrous Sulfate (IRON) 325 (65 Fe) MG TABS Take 1 tablet by mouth daily.    [provider]  folic acid (FOLVITE) 1 MG tablet TAKE 1 TABLET BY MOUTH  DAILY 02/29/20   Wendling, Crosby Oyster, DO  metroNIDAZOLE (FLAGYL) 500 MG tablet Take 500 mg by mouth 3 (three) times daily.    [provider]  NON FORMULARY as needed. Lectin shield Take 2 per meal     [provider]  NON FORMULARY as needed. Vital reds-energy supplement    [provider]  ondansetron (ZOFRAN ODT) 4 MG disintegrating tablet Take 1 tablet (4 mg total) by mouth every 6 (six) hours as needed for nausea or vomiting. tke 30 minutes before antibiotics 06/07/20   Cirigliano, Vito V, DO  sulfaSALAzine (AZULFIDINE) 500 MG tablet TAKE 2 TABLETS BY MOUTH 3  TIMES DAILY 01/02/20   Shelda Pal, DO    Physical Exam: Vitals:   06/18/20 1700 06/18/20 1708 06/18/20 1757 06/18/20 1815  BP: (!) 155/54   (!) 122/58  Pulse: (!) 107   (!) 108  Resp: (!) 30   16  Temp:  98.4 F (36.9 C)  (!) 100.5 F (38.1 C)  TempSrc:  Oral  Oral  SpO2: 97%   98%  Weight:   96.1 kg   Height:   5' 10"  (1.778 m)     Constitutional: NAD, calm, comfortable Eyes: PERRL, lids and conjunctivae normal ENMT: Mucous membranes are moist. Posterior pharynx clear of any exudate or lesions.Normal dentition.  Neck: normal, supple, no masses, no thyromegaly Respiratory: clear to  auscultation bilaterally, no wheezing, no crackles. Normal respiratory effort. No accessory muscle use.  Cardiovascular: Regular rate and rhythm, no murmurs / rubs / gallops. No extremity edema. 2+ pedal pulses. No carotid bruits.  Abdomen: no tenderness, no masses palpated. No hepatosplenomegaly. Bowel sounds positive.  Musculoskeletal: no clubbing / cyanosis. No joint deformity upper and lower extremities. Good ROM, no contractures. Normal muscle tone.  Skin: no rashes, lesions, ulcers. No induration Neurologic: CN 2-12 grossly intact. Sensation intact, DTR normal. Strength 5/5 in all 4.  Psychiatric: Normal judgment and insight. Alert and oriented x 3. Normal mood.    Labs on Admission: I have personally reviewed following labs and imaging studies  CBC: Recent Labs  Lab 06/18/20 1158  WBC 12.6*  HGB 8.2*  HCT 26.1*  MCV 85.0  PLT 812*   Basic Metabolic Panel: Recent Labs  Lab 06/18/20 1158  NA 128*  K 4.5  CL 92*  CO2 26  GLUCOSE 126*  BUN 22  CREATININE 1.13  CALCIUM 8.0*   GFR: Estimated Creatinine Clearance: 71.7 mL/min (by C-G formula based on SCr of 1.13 mg/dL). Liver Function Tests: Recent Labs  Lab 06/18/20 1158  AST 51*  ALT 54*  ALKPHOS 57  BILITOT 0.2*  PROT 5.9*  ALBUMIN 2.0*   Recent Labs  Lab 06/18/20 1158  LIPASE 23   No results for input(s): AMMONIA in the last 168 hours. Coagulation Profile: No results for input(s): INR, PROTIME in the last 168 hours. Cardiac Enzymes: No results for input(s): CKTOTAL, CKMB, CKMBINDEX, TROPONINI in the last 168 hours. BNP (last 3 results) No results for input(s): PROBNP in the last 8760 hours. HbA1C: No results for input(s): HGBA1C in the last 72 hours. CBG: No results for input(s): GLUCAP in the last 168 hours. Lipid Profile: No results for input(s): CHOL, HDL, LDLCALC, TRIG, CHOLHDL, LDLDIRECT in the last 72 hours. Thyroid Function Tests: No results for input(s): TSH, T4TOTAL, FREET4, T3FREE,  THYROIDAB in the last 72 hours. Anemia Panel: No results for input(s): VITAMINB12, FOLATE, FERRITIN, TIBC, IRON, RETICCTPCT in the last 72 hours. Urine analysis: No results found for: COLORURINE, APPEARANCEUR, LABSPEC, PHURINE, GLUCOSEU, HGBUR, BILIRUBINUR, KETONESUR, PROTEINUR, UROBILINOGEN, NITRITE, LEUKOCYTESUR  Radiological Exams on Admission: CT ABDOMEN PELVIS W CONTRAST  Result Date: 06/18/2020 CLINICAL DATA:  Diarrhea.  History of ulcerative colitis. EXAM: CT ABDOMEN AND PELVIS WITH CONTRAST TECHNIQUE: Multidetector CT imaging of the abdomen and pelvis was performed using the standard protocol following bolus administration of intravenous contrast. CONTRAST:  115m OMNIPAQUE IOHEXOL 300 MG/ML  SOLN COMPARISON:  No comparison studies available. FINDINGS: Lower chest: Unremarkable. Hepatobiliary: No suspicious focal abnormality within the liver parenchyma. There is no evidence for gallstones,  gallbladder wall thickening, or pericholecystic fluid. No intrahepatic or extrahepatic biliary dilation. Pancreas: No focal mass lesion. No dilatation of the main duct. No intraparenchymal cyst. No peripancreatic edema. Spleen: No splenomegaly. No focal mass lesion. Adrenals/Urinary Tract: No adrenal nodule or mass. Exophytic low-density lesions lower pole right kidney are compatible with cyst. 5.6 cm low-density lesion lower interpolar left kidney is compatible with a cyst. Other scattered left renal cysts are associated with tiny left renal lesions too small to characterize but likely benign. No evidence for hydroureter. The urinary bladder appears normal for the degree of distention. Stomach/Bowel: Stomach is unremarkable. No gastric wall thickening. No evidence of outlet obstruction. Duodenum is normally positioned as is the ligament of Treitz. No small bowel wall thickening. No small bowel dilatation. The terminal ileum is normal. The appendix is normal. Right colon and hepatic flexure unremarkable. Left  colon is diffusely distended and featureless with mild circumferential wall thickening. Abnormal segment of colon starts in the distal transverse colon through the splenic flexure down to the rectum. Left colonic wall is ill-defined and there is subtle pericolonic edema/inflammation. Vascularity of the left colon is engorged. Vascular/Lymphatic: There is abdominal aortic atherosclerosis without aneurysm. There is no gastrohepatic or hepatoduodenal ligament lymphadenopathy. No retroperitoneal or mesenteric lymphadenopathy. No pelvic sidewall lymphadenopathy. Reproductive: The prostate gland and seminal vesicles are unremarkable. Other: No intraperitoneal free fluid. Musculoskeletal: No worrisome lytic or sclerotic osseous abnormality. IMPRESSION: 1. Left colon is diffusely distended and featureless with mild circumferential wall thickening starting the distal transverse colon through the splenic flexure down to the rectum mild associated pericolonic edema/inflammation. Imaging features are compatible with infectious/inflammatory colitis 2. Bilateral renal cysts. 3. Aortic Atherosclerosis (ICD10-I70.0). Electronically Signed   By: Misty Stanley M.D.   On: 06/18/2020 15:02    EKG: Independently reviewed.  Assessment/Plan Principal Problem:   C. difficile colitis Active Problems:   Ulcerative colitis with complication ([place])   Essential hypertension   Anemia   Sepsis ([place])    1. C.Diff colitis - causing sepsis 1. PO vanc (high dose) + IV flagyl 2. Hyponatremia: 1. IVF: NS at 100, got 1L bolus in ED 2. Repeat BMP in AM 3. Repeat CBC in AM 4. Message sent to Dr. Havery Moros for GI consult in AM 2. Anemia - 1. Acute on chronic anemia - suspect GI blood loss from C.Diff diarrhea 2. Trend with daily CBCs 3. HTN - 1. Holding home BP meds 4. UC - 1. Currently not on meds 2. Defer any need for treatment to GI  DVT prophylaxis: SCDs - reported h/o bloody diarrhea, anemia Code Status: Full Family  Communication: Spoke with wife on phone Disposition Plan: Home after treatment for C.Diff Consults called: Dr. Havery Moros for GI consult in AM Admission status: Admit to inpatient  Severity of Illness: The appropriate patient status for this patient is INPATIENT. Inpatient status is judged to be reasonable and necessary in order to provide the required intensity of service to ensure the patient's safety. The patient's presenting symptoms, physical exam findings, and initial radiographic and laboratory data in the context of their chronic comorbidities is felt to place them at high risk for further clinical deterioration. Furthermore, it is not anticipated that the patient will be medically stable for discharge from the hospital within 2 midnights of admission. The following factors support the patient status of inpatient.   IP status due to failed outpt ABx therapy for C.Diff.   * I certify that at the point of admission it is my  clinical judgment that the patient will require inpatient hospital care spanning beyond 2 midnights from the point of admission due to high intensity of service, high risk for further deterioration and high frequency of surveillance required.*    Lise Pincus M. DO [facility]  How to contact the [facility] Attending or Consulting provider [place] or covering provider during after hours [place], for this patient?  1. Check the care team in [facility] and look for a) attending/consulting [facility] provider listed and b) the [facility] team listed 2. Log into [email or web address]  Amion Physician Scheduling and messaging for groups and whole hospitals  On call and physician scheduling software for group practices, residents, hospitalists and other medical providers for call, clinic, rotation and shift schedules. On[employer] is a hospital-wide system for scheduling doctors and paging doctors on call. EasyPlot is for scientific plotting and data analysis.  [email or web address]  and use [facility]'s universal password to access. If you do not have the password, please contact the hospital operator.  3. Locate the [facility] provider you are looking for under [facility] and page to a number that you can be directly reached. 4. If you still have difficulty reaching the provider, please page the [facility] (Director on Call) for the Hospitalists listed on amion for assistance.  06/18/2020, 7:39 PM

## 2020-06-18 NOTE — Progress Notes (Signed)
   06/18/20 1958  Assess: MEWS Score  Temp (!) 101.4 F (38.6 C)  BP 127/62  Pulse Rate (!) 106  Resp 16  SpO2 95 %  O2 Device Room Air  Assess: MEWS Score  MEWS Temp 1  MEWS Systolic 0  MEWS Pulse 1  MEWS RR 0  MEWS LOC 0  MEWS Score 2  MEWS Score Color Yellow  Assess: if the MEWS score is Yellow or Red  Were vital signs taken at a resting state? Yes  Focused Assessment No change from prior assessment  Early Detection of Sepsis Score *See Row Information* High  MEWS guidelines implemented *See Row Information* No, previously yellow, continue vital signs every 4 hours  Treat  Pain Scale 0-10  Pain Score 0  Notify: Provider  Provider Barbaraann Share, MD  Date Provider Notified 06/18/20  Notification Type Page  Notification Reason Other (Comment) (MEWS score)  Response No new orders

## 2020-06-18 NOTE — Telephone Encounter (Signed)
Inbound call from pt's wife, Judeen Hammans stating that medication is not working and would like a call back to give you more info.  Can be reached at [phone].

## 2020-06-19 ENCOUNTER — Inpatient Hospital Stay (HOSPITAL_COMMUNITY): Payer: Medicare Other

## 2020-06-19 DIAGNOSIS — A0472 Enterocolitis due to Clostridium difficile, not specified as recurrent: Secondary | ICD-10-CM | POA: Diagnosis not present

## 2020-06-19 DIAGNOSIS — R933 Abnormal findings on diagnostic imaging of other parts of digestive tract: Secondary | ICD-10-CM | POA: Diagnosis not present

## 2020-06-19 DIAGNOSIS — K529 Noninfective gastroenteritis and colitis, unspecified: Secondary | ICD-10-CM

## 2020-06-19 DIAGNOSIS — D649 Anemia, unspecified: Secondary | ICD-10-CM | POA: Diagnosis not present

## 2020-06-19 LAB — CBC
HCT: 25.9 % — ABNORMAL LOW (ref 39.0–52.0)
Hemoglobin: 7.9 g/dL — ABNORMAL LOW (ref 13.0–17.0)
MCH: 26.8 pg (ref 26.0–34.0)
MCHC: 30.5 g/dL (ref 30.0–36.0)
MCV: 87.8 fL (ref 80.0–100.0)
Platelets: 432 10*3/uL — ABNORMAL HIGH (ref 150–400)
RBC: 2.95 MIL/uL — ABNORMAL LOW (ref 4.22–5.81)
RDW: 15.6 % — ABNORMAL HIGH (ref 11.5–15.5)
WBC: 10.5 10*3/uL (ref 4.0–10.5)
nRBC: 0 % (ref 0.0–0.2)

## 2020-06-19 LAB — BASIC METABOLIC PANEL
Anion gap: 10 (ref 5–15)
BUN: 14 mg/dL (ref 8–23)
CO2: 22 mmol/L (ref 22–32)
Calcium: 7.7 mg/dL — ABNORMAL LOW (ref 8.9–10.3)
Chloride: 99 mmol/L (ref 98–111)
Creatinine, Ser: 0.97 mg/dL (ref 0.61–1.24)
GFR, Estimated: >60 mL/min (ref >60)
Glucose, Bld: 107 mg/dL — ABNORMAL HIGH (ref 70–99)
Potassium: 4.6 mmol/L (ref 3.5–5.1)
Sodium: 131 mmol/L — ABNORMAL LOW (ref 135–145)

## 2020-06-19 LAB — HIV ANTIBODY (ROUTINE TESTING W REFLEX): HIV Screen 4th Generation wRfx: NONREACTIVE

## 2020-06-19 LAB — FERRITIN: Ferritin: 370 ng/mL — ABNORMAL HIGH (ref 24–336)

## 2020-06-19 LAB — IRON AND TIBC
Iron: 14 ug/dL — ABNORMAL LOW (ref 45–182)
Saturation Ratios: 11 % — ABNORMAL LOW (ref 17.9–39.5)
TIBC: 122 ug/dL — ABNORMAL LOW (ref 250–450)
UIBC: 108 ug/dL

## 2020-06-19 MED ORDER — VANCOMYCIN 50 MG/ML ORAL SOLUTION: 125.0000 mg | ORAL | Status: DC

## 2020-06-19 MED ORDER — ADULT MULTIVITAMIN W/MINERALS CH
1.0000 | ORAL_TABLET | Freq: Every day | ORAL | Status: DC
  Administered 2020-06-19 – 2020-06-27 (×9): 1 via ORAL
  Filled 2020-06-19 (×9): qty 1

## 2020-06-19 MED ORDER — PROSOURCE PLUS PO LIQD
30.0000 mL | Freq: Two times a day (BID) | ORAL | Status: DC
  Administered 2020-06-19 (×2): 30 mL via ORAL
  Filled 2020-06-19: qty 30

## 2020-06-19 MED ORDER — METRONIDAZOLE 500 MG PO TABS
500.0000 mg | ORAL_TABLET | Freq: Three times a day (TID) | ORAL | Status: DC
  Administered 2020-06-19 – 2020-06-27 (×24): 500 mg via ORAL
  Filled 2020-06-19 (×24): qty 1

## 2020-06-19 MED ORDER — VANCOMYCIN 50 MG/ML ORAL SOLUTION
125.0000 mg | Freq: Four times a day (QID) | ORAL | Status: DC
  Administered 2020-06-19 – 2020-06-27 (×32): 125 mg via ORAL
  Filled 2020-06-19 (×36): qty 2.5

## 2020-06-19 MED ORDER — SULFASALAZINE 500 MG PO TBEC
1000.0000 mg | DELAYED_RELEASE_TABLET | Freq: Three times a day (TID) | ORAL | Status: DC
  Administered 2020-06-19 – 2020-06-27 (×24): 1000 mg via ORAL
  Filled 2020-06-19 (×26): qty 2

## 2020-06-19 MED ORDER — VANCOMYCIN 50 MG/ML ORAL SOLUTION: 125.0000 mg | Freq: Every day | ORAL | Status: DC

## 2020-06-19 MED ORDER — SACCHAROMYCES BOULARDII 250 MG PO CAPS
250.0000 mg | ORAL_CAPSULE | Freq: Two times a day (BID) | ORAL | Status: DC
  Administered 2020-06-19 – 2020-06-27 (×17): 250 mg via ORAL
  Filled 2020-06-19 (×17): qty 1

## 2020-06-19 MED ORDER — BOOST / RESOURCE BREEZE PO LIQD CUSTOM
1.0000 | Freq: Two times a day (BID) | ORAL | Status: DC
  Administered 2020-06-19: 1 via ORAL

## 2020-06-19 MED ORDER — VANCOMYCIN 50 MG/ML ORAL SOLUTION: 125.0000 mg | Freq: Two times a day (BID) | ORAL | Status: DC

## 2020-06-19 MED ORDER — CALCIUM CARBONATE ANTACID 500 MG PO CHEW
1.0000 | CHEWABLE_TABLET | ORAL | Status: AC | PRN
  Administered 2020-06-19: 200 mg via ORAL
  Filled 2020-06-19: qty 1

## 2020-06-19 NOTE — Consult Note (Signed)
Referring Provider:  [facility]         Primary Care Physician:  Shelda Pal, DO Primary Gastroenterologist:  Gerrit Heck, MD             We were asked to see this patient for:    C-diff              ASSESSMENT / PLAN:   # C-diff colitis causing sepsis. He has long standing left sided UC but was in clinical and endoscopic remission on Sulfasalazine until a few weeks ago. Recently diagnosed with C-diff. No outpatient improvement after a week of oral vancomycin. Now on oral Vancomycin + IV Flagyl and today is the first day he has not had any diarrhea and was able to eat. No abdominal pain. Abdominal exam is benign. Hopefully this is all C-diff related and he is not having UC flare --Since improving would continue oral Vancomycin QID. There is a Producer, television/film/video of IV Flagyl. Okay to transition to PO flagyl. If he doesn't continue to improve then can switch him to Dificid and / or consider flexible sigmoidoscopy. --Add BID Florastor --I will resume home Azulfidine 1000 mg TID   # Normocytic anemia. Hgb 10.9 on 11/15, down to 8.2 on admission yesterday. He is at 7.9 today. No significant rectal bleeding.  --Check iron studies.   # HTN, BP meds on hold      HPI:                                                                                                                             Chief Complaint: C-diff diarrhea  Justin Rosales is a 69 y.o. male with a pmh significant for, not necessarily limited to:  Ulcerative colitis, adenomatous colon polyps, diverticulosis, HTN  Patient with longstanding UC diagnosed ~ 20 years ago. He established care with [place] in October 2020, on monotherapy with sulfasalazine at the time. He underwent surveillance colonoscopy Dec 2020 with removal of two small tubular adenomatous polyps. There were changes of mild inflammation in left colon and rectum but left colon as well as right colon biopsies were negative for active disease. Patient  did well on Azulfidine until several weeks ago. He was seen in office 05/21/20 with flare symptoms for 2-3 weeks consisting of diarrhea and scant rectal bleeding. He had Doxycycline in May for tick bite, otherwise no other antibiotics or med changes. We obtained labs, stool studies, started prednisone taper and continued sulfasalazine.  ESR and WBC were elevated.  He failed to get better on steroids so flexible sigmoidoscopy scheduled. On the day of procedure 11/19 stool returned positive for C-diff so flex sigmoidoscopy cancelled. He was started on Vancomycin 150 mg QID   and probiotics. Yesterday patient called office with complaints of fever, ongoing diarrhea. Advised to go to ED, went to [place] where he was tachycardic but otherwise hemodynamically stable.  COVID and Flu negative. Subsequently transferred here for admission.  Patient says he has been having loose stool every few hours for weeks. No significant rectal bleeding nor abdominal pain. His appetite has been poor. He has been running fevers up to 101 at home. No other GI or general medical complaints.    Data Review:  CRP 12.3, ESR 115 Na+ 129 >> 131 Cr 0.97 WBC 12.6 >> 10.5 Platelets  432 << 513 Hgb 7.9 ( drifting down from baseline of 10.9 on 06/04/20).    PREVIOUS ENDOSCOPIC EVALUATIONS / PERTINENT STUDIES    06/18/20 CT scan  abd/ pelvis with contrast --Left colon is diffusely distended and featureless with mild circumferential wall thickening starting the distal transverse colon through the splenic flexure down to the rectum mild associated pericolonic edema/inflammation. Imaging features are compatible with infectious/inflammatory colitis 2. Bilateral renal cysts. 3. Aortic Atherosclerosis (ICD10-I70.0).  Dec 2020 Surveillance colonoscopy  -One 3 mm polyp at the hepatic flexure, removed with a cold snare. Resected and retrieved. - One 5 mm polyp in the sigmoid colon, removed with a cold snare. Resected and  retrieved. - Diverticulosis in the ascending colon. - The descending colon, transverse colon, ascending colon and cecum are normal. Biopsied. - Features of previously diagnosed UC were noted in the rectum, rectosigmoid colon, and distal sigmoid colon. There was mildly active inflammation in the distal rectum (erythema, single ulcer), with inactive features of chronic inflammation in the remainder of the proximal rectum, rectosigmoid, and distal sigmoid colon, characterized by scarring and loss of vascularity. Biopsied. - Non-bleeding internal hemorrhoids  Diagnosis 1. Surgical [P], colon, hepatic flexure, polyp - TUBULAR ADENOMA. - NO HIGH GRADE DYSPLASIA OR MALIGNANCY. 2. Surgical [P], ascending colon BX's - BENIGN COLONIC MUCOSA. - NO ACTIVE INFLAMMATION. - NO DYSPLASIA OR MALIGNANCY. 3. Surgical [P], transverse colon BX's - BENIGN COLONIC MUCOSA. - NO ACTIVE INFLAMMATION. - NO DYSPLASIA OR MALIGNANCY. 4. Surgical [P], descending colon BX's - BENIGN COLONIC MUCOSA. - NO ACTIVE INFLAMMATION. - NO DYSPLASIA OR MALIGNANCY. 5. Surgical [P], sigmoid colon BX's - BENIGN COLONIC MUCOSA. - NO ACTIVE INFLAMMATION. - NO DYSPLASIA OR MALIGNANCY. 6. Surgical [P], colon at 75 - Cameron DYSPLASIA OR MALIGNANCY. 7. Surgical [P], colon, sigmoid, polyp - TUBULAR ADENOMA. - NO HIGH GRADE DYSPLASIA OR MALIGNANCY. 8. Surgical [P], colon, rectal BX's - ULCER. - NO DYSPLASIA OR MALIGNANCY.  Remote procedures:  Colonoscopy (03/2007, Dr. Dorrene German): Mild colitis/proctitis from 23 cm from anal verge through the rectum, with relative sparing of the distal rectum. Biopsies with moderate chronic, active colitis. Small internal hemorrhoids, early sigmoid diverticulosis, otherwise normal colon, normal TI -VCE (04/2007, Dr. Dorrene German): Mild erythema in the duodenal bulb, otherwise normal. No bleeding. Good prep, capsule reached the cecum -Colonoscopy (04/2009,Dr.  Rhoton): Active inflammation from hepatic flexure through the rectum without skip areas, characterized by erythema, friability, heme, mucus, edema, exudate. Relative sparing of the distal rectum thought 2/2hydrocortisone suppositories, Canasa, Rowasa enemas. Biopsies with severe active, chronic colitis. Biopsies of the right colon were normal. Small internal hemorrhoids. Normal TI. -Colonoscopy (06/2019, Dr. Bryan Lemma): Hepatic flexure polyp (TA), sigmoid polyp (TA), normal mucosa cecum 3 descending (biopsies benign), mild active colitis from 35 cm to distal rectum (biopsies: Inactive, quiescent colitis), internal hemorrhoids.  Repeat 2 years .   Past Medical History:  Diagnosis Date  . Anemia   . Arthritis   . Elevated cholesterol   . Hypertension   . UC (ulcerative colitis) [facility]     Past Surgical History:  Procedure Laterality Date  .  COLONOSCOPY     First done at [facility] around age 51. Dr Dorrene German possibly With [facility] x2. last one done around 2012     Prior to Admission medications   Medication Sig Start Date End Date Taking? Authorizing Provider  acetaminophen (TYLENOL) 500 MG tablet Take 500 mg by mouth every 6 (six) hours as needed for fever.   Yes [provider]  amLODipine-valsartan (EXFORGE) 10-320 MG tablet TAKE 1 TABLET BY MOUTH  DAILY 02/06/20  Yes Shelda Pal, DO  diphenhydrAMINE HCl, Sleep, (ZZZQUIL PO) Take 10 mLs by mouth daily as needed (sleep).   Yes [provider]  Ferrous Sulfate (IRON) 325 (65 Fe) MG TABS Take 1 tablet by mouth daily.   Yes [provider]  folic acid (FOLVITE) 1 MG tablet TAKE 1 TABLET BY MOUTH  DAILY Patient taking differently: Take 1 mg by mouth daily.  02/29/20  Yes Wendling, Crosby Oyster, DO  NON FORMULARY Take 1 tablet by mouth daily. Vital reds-energy supplement   Yes [provider]  Saccharomyces boulardii (FLORASTOR PO) Take 1 capsule by mouth in the morning and at  bedtime.   Yes [provider]  sulfaSALAzine (AZULFIDINE) 500 MG tablet TAKE 2 TABLETS BY MOUTH 3  TIMES DAILY Patient taking differently: Take 1,000 mg by mouth 3 (three) times daily.  01/02/20  Yes Wendling, Crosby Oyster, DO  Turmeric 500 MG TABS Take 1,000 mg by mouth daily.   Yes [provider]  vancomycin (VANCOCIN HCL) 125 MG capsule Take 1 capsule (125 mg total) by mouth 4 (four) times daily for 14 days. 06/08/20 06/22/20 Yes Cirigliano, Vito V, DO  VITAMIN D, CHOLECALCIFEROL, PO Take 5,000 Units by mouth daily.   Yes [provider]  chlorthalidone (HYGROTON) 25 MG tablet TAKE 1 TABLET BY MOUTH  DAILY Patient not taking: Reported on 06/18/2020 01/05/20   Shelda Pal, DO  ondansetron (ZOFRAN ODT) 4 MG disintegrating tablet Take 1 tablet (4 mg total) by mouth every 6 (six) hours as needed for nausea or vomiting. tke 30 minutes before antibiotics Patient not taking: Reported on 06/18/2020 06/07/20   Cirigliano, Dominic Pea, DO    Current Facility-Administered Medications  Medication Dose Route Frequency Provider Last Rate Last Admin  . 0.9 %  sodium chloride infusion   Intravenous Continuous Etta Quill, DO 100 mL/hr at 06/19/20 0655 New Bag at 06/19/20 0655  . acetaminophen (TYLENOL) tablet 650 mg  650 mg Oral Q6H PRN Etta Quill, DO   650 mg at 06/18/20 2025   Or  . acetaminophen (TYLENOL) suppository 650 mg  650 mg Rectal Q6H PRN Etta Quill, DO      . metroNIDAZOLE (FLAGYL) IVPB 500 mg  500 mg Intravenous Q8H Sharyn Lull A, PA-C 100 mL/hr at 06/19/20 0657 500 mg at 06/19/20 0657  . ondansetron (ZOFRAN) tablet 4 mg  4 mg Oral Q6H PRN Etta Quill, DO       Or  . ondansetron [facility]) injection 4 mg  4 mg Intravenous Q6H PRN Etta Quill, DO      . vancomycin (VANCOCIN) 50 mg/mL oral solution 500 mg  500 mg Oral Q6H Belaya, Maria A, PA-C   500 mg at 06/19/20 1610    Allergies as of 06/18/2020  . (No Known Allergies)     Family History  Problem Relation Age of Onset  . Hypertension Mother   . Diabetes Mother   . Alzheimer's disease Mother   .  Parkinson's disease Father   . Alzheimer's disease Father   . Colon cancer Neg Hx   . Esophageal cancer Neg Hx   . Rectal cancer Neg Hx   . Stomach cancer Neg Hx     Social History   Socioeconomic History  . Marital status: Married    Spouse name: Not on file  . Number of children: 2  . Years of education: Not on file  . Highest education level: Not on file  Occupational History  . Occupation: Emergency planning/management officer  Tobacco Use  . Smoking status: Former Research scientist (life sciences)  . Smokeless tobacco: Never Used  Vaping Use  . Vaping Use: Never used  Substance and Sexual Activity  . Alcohol use: Yes    Comment: ocassionally  . Drug use: Not Currently  . Sexual activity: Not on file  Other Topics Concern  . Not on file  Social History Narrative  . Not on file   Social Determinants of Health   Financial Resource Strain:   . Difficulty of Paying Living Expenses: Not on file  Food Insecurity:   . Worried About Charity fundraiser in the Last Year: Not on file  . Ran Out of Food in the Last Year: Not on file  Transportation Needs:   . Lack of Transportation (Medical): Not on file  . Lack of Transportation (Non-Medical): Not on file  Physical Activity:   . Days of Exercise per Week: Not on file  . Minutes of Exercise per Session: Not on file  Stress:   . Feeling of Stress : Not on file  Social Connections:   . Frequency of Communication with Friends and Family: Not on file  . Frequency of Social Gatherings with Friends and Family: Not on file  . Attends Religious Services: Not on file  . Active Member of Clubs or Organizations: Not on file  . Attends Archivist Meetings: Not on file  . Marital Status: Not on file  Intimate Partner Violence:   . Fear of Current or Ex-Partner: Not on file  . Emotionally Abused: Not on file  . Physically Abused: Not  on file  . Sexually Abused: Not on file    Review of Systems: All systems reviewed and negative except where noted in HPI.  OBJECTIVE:    Physical Exam: Vital signs in last 24 hours: Temp:  [97.9 F (36.6 C)-101.4 F (38.6 C)] 98.9 F (37.2 C) (11/30 0416) Pulse Rate:  [90-123] 94 (11/30 0416) Resp:  [16-30] 16 (11/30 0416) BP: (95-155)/(46-109) 129/60 (11/30 0416) SpO2:  [92 %-100 %] 94 % (11/30 0416) Weight:  [96.1 kg-99.8 kg] 96.1 kg (11/29 1757) Last BM Date: 06/18/20 General:   Alert, well-developed,  male in NAD Psych:  Pleasant, cooperative. Normal mood and affect. Eyes:  Pupils equal, sclera clear, no icterus.   Conjunctiva pink. Ears:  Normal auditory acuity. Nose:  No deformity, discharge,  or lesions. Neck:  Supple; no masses Lungs:  Clear throughout to auscultation.   No wheezes, crackles, or rhonchi.  Heart:  Regular rate and rhythm; no murmurs, no lower extremity edema Abdomen:  Soft, non-distended, nontender, BS active, no palp mass   Rectal:  Deferred  Msk:  Symmetrical without gross deformities. . Neurologic:  Alert and  oriented x4;  grossly normal neurologically. Skin:  Intact without significant lesions or rashes.  Filed Weights   06/18/20 1146 06/18/20 1757  Weight: 99.8 kg 96.1 kg     Scheduled inpatient medications . vancomycin  500 mg  Oral Q6H    Intake/Output from previous day: 11/29 0701 - 11/30 0700 In: 1928.5 [I.V.:751.8; IV Piggyback:1176.7] Out: -  Intake/Output this shift: No intake/output data recorded.   Lab Results: Recent Labs    06/18/20 1158 06/19/20 0724  WBC 12.6* 10.5  HGB 8.2* 7.9*  HCT 26.1* 25.9*  PLT 513* 432*   BMET Recent Labs    06/18/20 1158 06/19/20 0724  NA 128* 131*  K 4.5 4.6  CL 92* 99  CO2 26 22  GLUCOSE 126* 107*  BUN 22 14  CREATININE 1.13 0.97  CALCIUM 8.0* 7.7*   LFT Recent Labs    06/18/20 1158  PROT 5.9*  ALBUMIN 2.0*  AST 51*  ALT 54*  ALKPHOS 57  BILITOT 0.2*    PT/INR No results for input(s): LABPROT, INR in the last 72 hours. Hepatitis Panel No results for input(s): HEPBSAG, HCVAB, HEPAIGM, HEPBIGM in the last 72 hours.   . CBC Latest Ref Rng & Units 06/19/2020 06/18/2020 06/11/2020  WBC 4.0 - 10.5 K/uL 10.5 12.6(H) 7.7  Hemoglobin 13.0 - 17.0 g/dL 7.9(L) 8.2(L) 9.1(L)  Hematocrit 39 - 52 % 25.9(L) 26.1(L) 27.8(L)  Platelets 150 - 400 K/uL 432(H) 513(H) 345.0    . CMP Latest Ref Rng & Units 06/19/2020 06/18/2020 06/11/2020  Glucose 70 - 99 mg/dL 107(H) 126(H) 122(H)  BUN 8 - 23 mg/dL _0 Creatinine 0.61 - 1.24 mg/dL 0.97 1.13 1.03  Sodium 135 - 145 mmol/L 131(L) 128(L) 130(L)  Potassium 3.5 - 5.1 mmol/L 4.6 4.5 3.5  Chloride 98 - 111 mmol/L 99 92(L) 94(L)  CO2 22 - 32 mmol/L _1 Calcium 8.9 - 10.3 mg/dL 7.7(L) 8.0(L) 7.9(L)  Total Protein 6.5 - 8.1 g/dL - 5.9(L) -  Total Bilirubin 0.3 - 1.2 mg/dL - 0.2(L) -  Alkaline Phos 38 - 126 U/L - 57 -  AST 15 - 41 U/L - 51(H) -  ALT 0 - 44 U/L - 54(H) -   Studies/Results: CT ABDOMEN PELVIS W CONTRAST  Result Date: 06/18/2020 CLINICAL DATA:  Diarrhea.  History of ulcerative colitis. EXAM: CT ABDOMEN AND PELVIS WITH CONTRAST TECHNIQUE: Multidetector CT imaging of the abdomen and pelvis was performed using the standard protocol following bolus administration of intravenous contrast. CONTRAST:  15m OMNIPAQUE IOHEXOL 300 MG/ML  SOLN COMPARISON:  No comparison studies available. FINDINGS: Lower chest: Unremarkable. Hepatobiliary: No suspicious focal abnormality within the liver parenchyma. There is no evidence for gallstones, gallbladder wall thickening, or pericholecystic fluid. No intrahepatic or extrahepatic biliary dilation. Pancreas: No focal mass lesion. No dilatation of the main duct. No intraparenchymal cyst. No peripancreatic edema. Spleen: No splenomegaly. No focal mass lesion. Adrenals/Urinary Tract: No adrenal nodule or mass. Exophytic low-density lesions lower pole right  kidney are compatible with cyst. 5.6 cm low-density lesion lower interpolar left kidney is compatible with a cyst. Other scattered left renal cysts are associated with tiny left renal lesions too small to characterize but likely benign. No evidence for hydroureter. The urinary bladder appears normal for the degree of distention. Stomach/Bowel: Stomach is unremarkable. No gastric wall thickening. No evidence of outlet obstruction. Duodenum is normally positioned as is the ligament of Treitz. No small bowel wall thickening. No small bowel dilatation. The terminal ileum is normal. The appendix is normal. Right colon and hepatic flexure unremarkable. Left colon is diffusely distended and featureless with mild circumferential wall thickening. Abnormal segment of colon starts in the distal transverse colon through the splenic flexure down to  the rectum. Left colonic wall is ill-defined and there is subtle pericolonic edema/inflammation. Vascularity of the left colon is engorged. Vascular/Lymphatic: There is abdominal aortic atherosclerosis without aneurysm. There is no gastrohepatic or hepatoduodenal ligament lymphadenopathy. No retroperitoneal or mesenteric lymphadenopathy. No pelvic sidewall lymphadenopathy. Reproductive: The prostate gland and seminal vesicles are unremarkable. Other: No intraperitoneal free fluid. Musculoskeletal: No worrisome lytic or sclerotic osseous abnormality. IMPRESSION: 1. Left colon is diffusely distended and featureless with mild circumferential wall thickening starting the distal transverse colon through the splenic flexure down to the rectum mild associated pericolonic edema/inflammation. Imaging features are compatible with infectious/inflammatory colitis 2. Bilateral renal cysts. 3. Aortic Atherosclerosis (ICD10-I70.0). Electronically Signed   By: Misty Stanley M.D.   On: 06/18/2020 15:02    Principal Problem:   C. difficile colitis Active Problems:   Ulcerative colitis with  complication ([place])   Essential hypertension   Anemia   Sepsis ([place])    Tye Savoy, NP-C @  06/19/2020, 9:08 AM

## 2020-06-19 NOTE — Progress Notes (Addendum)
PROGRESS NOTE    Justin Rosales  DQQ:[id number] DOB: 1950-12-21 DOA: 06/18/2020 PCP: Shelda Pal, DO   Brief Narrative:  Justin Rosales is a 69 y.o. male with medical history significant of UC, HTN.  Pt with diarrhea for past couple of weeks, diagnosed with C. Diff based on GI pathogen pnl on 11/16.  Treatment with PO vanc started 11/19. Diarrhea initially bloody, now brown. Diarrhea has persisted since starting treatment.  Continues to have intermittent fevers.  BPs running on low side despite not taking BP meds.  Back in to ED today. CT abd pelvis shows colitis. Pt started on high dose PO vanc and IV flagyl, and sent for admission  Assessment & Plan:   Principal Problem:   C. difficile colitis Active Problems:   Ulcerative colitis with complication ([place])   Essential hypertension   Anemia   Sepsis ([place])  C.Diff colitis, acute recurrent, POA Meeting sepsis criteria -Continue p.o. Vanco and p.o. Flagyl, IV Flagyl shortage unfortunately unavailable -GI following, appreciate insight recommendations -Profound diarrhea resolving over the past 48 hours, no bowel movement since yesterday 6 PM -Continue IV fluids as p.o. intake increases appropriately  Anemia, acute blood loss anemia on chronic anemia of chronic disease -Baseline appears to be around 11, currently 7.9 -Bleeding appears to have resolved as diarrhea improves -Continue to follow morning labs  HTN, essential -Borderline hypotension, currently holding home BP meds  UC  -Currently not on meds -Defer any need for treatment to GI  DVT prophylaxis: SCDs - reported h/o bloody diarrhea, anemia Code Status: Full Family Communication: Spoke with wife at bedside  Status is: Inpatient  Dispo: The patient is from: Home              Anticipated d/c is to: Home              Anticipated d/c date is: 24 to 48 hours              Patient currently not medically stable for discharge given ongoing need for IV fluids, close  monitoring in the setting of profound diarrhea and dehydration  Consultants:   GI  Procedures:   None  Antimicrobials:  P.o. vancomycin, p.o. Flagyl  Subjective: No acute issues or events overnight, diarrhea appears to be resolving.  Denies chest pain, shortness of breath, nausea, vomiting, headache, fevers, chills  Objective: Vitals:   06/18/20 1951 06/18/20 1958 06/19/20 0001 06/19/20 0416  BP: 126/76 127/62 (!) 126/58 129/60  Pulse: 90 (!) 106 98 94  Resp: 18 16 16 16   Temp: 97.9 F (36.6 C) (!) 101.4 F (38.6 C) 98.6 F (37 C) 98.9 F (37.2 C)  TempSrc: Oral Oral Oral Oral  SpO2: 96% 95% 92% 94%  Weight:      Height:        Intake/Output Summary (Last 24 hours) at 06/19/2020 0743 Last data filed at 06/19/2020 0456 Gross per 24 hour  Intake 1928.51 ml  Output --  Net 1928.51 ml   Filed Weights   06/18/20 1146 06/18/20 1757  Weight: 99.8 kg 96.1 kg    Examination:  General exam: Appears calm and comfortable  Respiratory system: Clear to auscultation. Respiratory effort normal. Cardiovascular system: S1 & S2 heard, RRR. No JVD, murmurs, rubs, gallops or clicks. No pedal edema. Gastrointestinal system: Abdomen is nondistended, soft and nontender. No organomegaly or masses felt. Normal bowel sounds heard. Central nervous system: Alert and oriented. No focal neurological deficits. Extremities: Symmetric 5 x 5 power. Skin: No  rashes, lesions or ulcers Psychiatry: Judgement and insight appear normal. Mood & affect appropriate.     Data Reviewed: I have personally reviewed following labs and imaging studies  CBC: Recent Labs  Lab 06/18/20 1158 06/19/20 0724  WBC 12.6* 10.5  HGB 8.2* 7.9*  HCT 26.1* 25.9*  MCV 85.0 87.8  PLT 513* 572*   Basic Metabolic Panel: Recent Labs  Lab 06/18/20 1158  NA 128*  K 4.5  CL 92*  CO2 26  GLUCOSE 126*  BUN 22  CREATININE 1.13  CALCIUM 8.0*   GFR: Estimated Creatinine Clearance: 71.7 mL/min (by C-G formula  based on SCr of 1.13 mg/dL). Liver Function Tests: Recent Labs  Lab 06/18/20 1158  AST 51*  ALT 54*  ALKPHOS 57  BILITOT 0.2*  PROT 5.9*  ALBUMIN 2.0*   Recent Labs  Lab 06/18/20 1158  LIPASE 23   No results for input(s): AMMONIA in the last 168 hours. Coagulation Profile: No results for input(s): INR, PROTIME in the last 168 hours. Cardiac Enzymes: No results for input(s): CKTOTAL, CKMB, CKMBINDEX, TROPONINI in the last 168 hours. BNP (last 3 results) No results for input(s): PROBNP in the last 8760 hours. HbA1C: No results for input(s): HGBA1C in the last 72 hours. CBG: No results for input(s): GLUCAP in the last 168 hours. Lipid Profile: No results for input(s): CHOL, HDL, LDLCALC, TRIG, CHOLHDL, LDLDIRECT in the last 72 hours. Thyroid Function Tests: No results for input(s): TSH, T4TOTAL, FREET4, T3FREE, THYROIDAB in the last 72 hours. Anemia Panel: No results for input(s): VITAMINB12, FOLATE, FERRITIN, TIBC, IRON, RETICCTPCT in the last 72 hours. Sepsis Labs: Recent Labs  Lab 06/18/20 1338  LATICACIDVEN 0.9    Recent Results (from the past 240 hour(s))  Blood culture (routine x 2)     Status: None (Preliminary result)   Collection Time: 06/18/20  1:30 PM   Specimen: BLOOD  Result Value Ref Range Status   Specimen Description   Final    BLOOD RIGHT ANTECUBITAL Performed at [facility], [place] [address] [place], [place] [address]    Special Requests   Final    BOTTLES DRAWN AEROBIC AND ANAEROBIC Blood Culture adequate volume Performed at [facility], [address] [place], [place] [address]    Culture PENDING  Incomplete   Report Status PENDING  Incomplete  Resp Panel by RT-PCR (Flu A&B, Covid) Nasopharyngeal Swab     Status: None   Collection Time: 06/18/20  1:38 PM   Specimen: Nasopharyngeal Swab; Nasopharyngeal(NP) swabs in vial transport medium  Result Value Ref Range Status   SARS Coronavirus 2 by RT PCR NEGATIVE NEGATIVE Final      Comment: (NOTE) SARS-CoV-2 target nucleic acids are NOT DETECTED.  The SARS-CoV-2 RNA is generally detectable in upper respiratory specimens during the acute phase of infection. The lowest concentration of SARS-CoV-2 viral copies this assay can detect is 138 copies/mL. A negative result does not preclude SARS-Cov-2 infection and should not be used as the sole basis for treatment or other patient management decisions. A negative result may occur with  improper specimen collection/handling, submission of specimen other than nasopharyngeal swab, presence of viral mutation(s) within the areas targeted by this assay, and inadequate number of viral copies(<138 copies/mL). A negative result must be combined with clinical observations, patient history, and epidemiological information. The expected result is Negative.  Fact Sheet for Patients:  EntrepreneurPulse.com.au  Fact Sheet for Healthcare Providers:  IncredibleEmployment.be  This test is no t  yet approved or cleared by the [place] and  has been authorized for detection and/or diagnosis of SARS-CoV-2 by FDA under an Emergency Use Authorization (EUA). This EUA will remain  in effect (meaning this test can be used) for the duration of the COVID-19 declaration under Section 564(b)(1) of the Act, 21 U.S.C.section 360bbb-3(b)(1), unless the authorization is terminated  or revoked sooner.       Influenza A by PCR NEGATIVE NEGATIVE Final   Influenza B by PCR NEGATIVE NEGATIVE Final    Comment: (NOTE) The Xpert Xpress SARS-CoV-2/FLU/RSV plus assay is intended as an aid in the diagnosis of influenza from Nasopharyngeal swab specimens and should not be used as a sole basis for treatment. Nasal washings and aspirates are unacceptable for Xpert Xpress SARS-CoV-2/FLU/RSV testing.  Fact Sheet for Patients: EntrepreneurPulse.com.au  Fact Sheet for Healthcare  Providers: IncredibleEmployment.be  This test is not yet approved or cleared by the [place] FDA and has been authorized for detection and/or diagnosis of SARS-CoV-2 by FDA under an Emergency Use Authorization (EUA). This EUA will remain in effect (meaning this test can be used) for the duration of the COVID-19 declaration under Section 564(b)(1) of the Act, 21 U.S.C. section 360bbb-3(b)(1), unless the authorization is terminated or revoked.  Performed at [facility], [address], [place], [place] [address]          Radiology Studies: CT ABDOMEN PELVIS W CONTRAST  Result Date: 06/18/2020 CLINICAL DATA:  Diarrhea.  History of ulcerative colitis. EXAM: CT ABDOMEN AND PELVIS WITH CONTRAST TECHNIQUE: Multidetector CT imaging of the abdomen and pelvis was performed using the standard protocol following bolus administration of intravenous contrast. CONTRAST:  1105m OMNIPAQUE IOHEXOL 300 MG/ML  SOLN COMPARISON:  No comparison studies available. FINDINGS: Lower chest: Unremarkable. Hepatobiliary: No suspicious focal abnormality within the liver parenchyma. There is no evidence for gallstones, gallbladder wall thickening, or pericholecystic fluid. No intrahepatic or extrahepatic biliary dilation. Pancreas: No focal mass lesion. No dilatation of the main duct. No intraparenchymal cyst. No peripancreatic edema. Spleen: No splenomegaly. No focal mass lesion. Adrenals/Urinary Tract: No adrenal nodule or mass. Exophytic low-density lesions lower pole right kidney are compatible with cyst. 5.6 cm low-density lesion lower interpolar left kidney is compatible with a cyst. Other scattered left renal cysts are associated with tiny left renal lesions too small to characterize but likely benign. No evidence for hydroureter. The urinary bladder appears normal for the degree of distention. Stomach/Bowel: Stomach is unremarkable. No gastric wall thickening. No evidence of outlet  obstruction. Duodenum is normally positioned as is the ligament of Treitz. No small bowel wall thickening. No small bowel dilatation. The terminal ileum is normal. The appendix is normal. Right colon and hepatic flexure unremarkable. Left colon is diffusely distended and featureless with mild circumferential wall thickening. Abnormal segment of colon starts in the distal transverse colon through the splenic flexure down to the rectum. Left colonic wall is ill-defined and there is subtle pericolonic edema/inflammation. Vascularity of the left colon is engorged. Vascular/Lymphatic: There is abdominal aortic atherosclerosis without aneurysm. There is no gastrohepatic or hepatoduodenal ligament lymphadenopathy. No retroperitoneal or mesenteric lymphadenopathy. No pelvic sidewall lymphadenopathy. Reproductive: The prostate gland and seminal vesicles are unremarkable. Other: No intraperitoneal free fluid. Musculoskeletal: No worrisome lytic or sclerotic osseous abnormality. IMPRESSION: 1. Left colon is diffusely distended and featureless with mild circumferential wall thickening starting the distal transverse colon through the splenic flexure down to the rectum mild associated pericolonic edema/inflammation. Imaging features are  compatible with infectious/inflammatory colitis 2. Bilateral renal cysts. 3. Aortic Atherosclerosis (ICD10-I70.0). Electronically Signed   By: Misty Stanley M.D.   On: 06/18/2020 15:02        Scheduled Meds: . vancomycin  500 mg Oral Q6H   Continuous Infusions: . sodium chloride 100 mL/hr at 06/19/20 0655  . metronidazole 500 mg (06/19/20 0657)     LOS: 1 day   Time spent: 38mn  Jerald Hennington C Hanni Milford, DO [facility]  If 7PM-7AM, please contact night-coverage [email or web address]  06/19/2020, 7:43 AM

## 2020-06-19 NOTE — Progress Notes (Signed)
Initial Nutrition Assessment  DOCUMENTATION CODES:   Obesity unspecified  INTERVENTION:  - will order Boost Breeze BID, each supplement provides 250 kcal and 9 grams of protein. - will order 30 ml Prosource Plus BID, each supplement provides 100 kcal and 15 grams protein.  - will order 1 tablet multivitamin with minerals/day. - complete NFPE at follow-up.    NUTRITION DIAGNOSIS:   Increased nutrient needs related to acute illness as evidenced by estimated needs.  GOAL:   Patient will meet greater than or equal to 90% of their needs  MONITOR:   PO intake, Supplement acceptance, Labs, Weight trends  REASON FOR ASSESSMENT:   Malnutrition Screening Tool  ASSESSMENT:   69 y.o. male with medical history of UC, HTN, arthritis, and anemia. He presented to the ED due to diarrhea for several weeks, diagnosed with C. Diff based on GI pathogen on 11/16. He continues to have intermittent fevers.  Patient out of the room and location was noted to be diagnostic radiology. Diet advanced from NPO to Heart Healthy yesterday at 1907 and no intakes documented since that time.   He has not been seen by a [facility] RD at any time in the past.   Weight yesterday documented as both 212 lb and 220 lb. Weight on 06/08/20 was 220 lb. Weight on 05/21/20 was 223 lb which indicates 11 lb weight loss (5% body weight) in 1 month.   MST report note indicates patient stated losing 25 lb in the past 8 weeks.   Per notes: - sepsis 2/2 Cdiff colitis - acute on chronic anemia thought to be d/t GI losses d/t Cdiff - dx of UC PTA not currently on meds - GI following   Labs reviewed; Na: 131 mmol/l, Ca: 7.7 mg/dl. Medications reviewed; 250 mg florastor BID. IVF; NS @ 100 ml/hr.    NUTRITION - FOCUSED PHYSICAL EXAM:  unable to complete at this time.   Diet Order:   Diet Order            Diet Heart Room service appropriate? Yes; Fluid consistency: Thin  Diet effective now                  EDUCATION NEEDS:   No education needs have been identified at this time  Skin:  Skin Assessment: Reviewed RN Assessment  Last BM:  11/29  Height:   Ht Readings from Last 1 Encounters:  06/18/20 5' 10"  (1.778 m)    Weight:   Wt Readings from Last 1 Encounters:  06/18/20 96.1 kg     Estimated Nutritional Needs:  Kcal:  2100-2300 kcal Protein:  100-115 grams Fluid:  >/= 2.5 L/day      Jarome Matin, MS, RD, LDN, CNSC Inpatient Clinical Dietitian RD pager # available in AMION  After hours/weekend pager # available in [facility]

## 2020-06-20 DIAGNOSIS — E871 Hypo-osmolality and hyponatremia: Secondary | ICD-10-CM

## 2020-06-20 DIAGNOSIS — D649 Anemia, unspecified: Secondary | ICD-10-CM | POA: Diagnosis not present

## 2020-06-20 DIAGNOSIS — R933 Abnormal findings on diagnostic imaging of other parts of digestive tract: Secondary | ICD-10-CM | POA: Diagnosis not present

## 2020-06-20 DIAGNOSIS — A0472 Enterocolitis due to Clostridium difficile, not specified as recurrent: Secondary | ICD-10-CM | POA: Diagnosis not present

## 2020-06-20 DIAGNOSIS — R140 Abdominal distension (gaseous): Secondary | ICD-10-CM

## 2020-06-20 DIAGNOSIS — A419 Sepsis, unspecified organism: Secondary | ICD-10-CM | POA: Diagnosis not present

## 2020-06-20 DIAGNOSIS — R600 Localized edema: Secondary | ICD-10-CM

## 2020-06-20 DIAGNOSIS — K51519 Left sided colitis with unspecified complications: Secondary | ICD-10-CM | POA: Diagnosis not present

## 2020-06-20 DIAGNOSIS — I10 Essential (primary) hypertension: Secondary | ICD-10-CM | POA: Diagnosis not present

## 2020-06-20 LAB — COMPREHENSIVE METABOLIC PANEL
ALT: 34 U/L (ref 0–44)
AST: 20 U/L (ref 15–41)
Albumin: 1.9 g/dL — ABNORMAL LOW (ref 3.5–5.0)
Alkaline Phosphatase: 45 U/L (ref 38–126)
Anion gap: 8 (ref 5–15)
BUN: 12 mg/dL (ref 8–23)
CO2: 24 mmol/L (ref 22–32)
Calcium: 7.4 mg/dL — ABNORMAL LOW (ref 8.9–10.3)
Chloride: 98 mmol/L (ref 98–111)
Creatinine, Ser: 0.94 mg/dL (ref 0.61–1.24)
GFR, Estimated: >60 mL/min (ref >60)
Glucose, Bld: 149 mg/dL — ABNORMAL HIGH (ref 70–99)
Potassium: 4.5 mmol/L (ref 3.5–5.1)
Sodium: 130 mmol/L — ABNORMAL LOW (ref 135–145)
Total Bilirubin: 0.5 mg/dL (ref 0.3–1.2)
Total Protein: 4.8 g/dL — ABNORMAL LOW (ref 6.5–8.1)

## 2020-06-20 LAB — CBC
HCT: 24.1 % — ABNORMAL LOW (ref 39.0–52.0)
Hemoglobin: 7.6 g/dL — ABNORMAL LOW (ref 13.0–17.0)
MCH: 27.2 pg (ref 26.0–34.0)
MCHC: 31.5 g/dL (ref 30.0–36.0)
MCV: 86.4 fL (ref 80.0–100.0)
Platelets: 417 10*3/uL — ABNORMAL HIGH (ref 150–400)
RBC: 2.79 MIL/uL — ABNORMAL LOW (ref 4.22–5.81)
RDW: 15.8 % — ABNORMAL HIGH (ref 11.5–15.5)
WBC: 9 10*3/uL (ref 4.0–10.5)
nRBC: 0.4 % — ABNORMAL HIGH (ref 0.0–0.2)

## 2020-06-20 MED ORDER — SODIUM CHLORIDE 0.9 % IV SOLN: INTRAVENOUS | Status: AC

## 2020-06-20 MED ORDER — CALCIUM GLUCONATE-NACL 1-0.675 GM/50ML-% IV SOLN
1.0000 g | INTRAVENOUS | Status: AC
  Administered 2020-06-20: 1000 mg via INTRAVENOUS
  Filled 2020-06-20: qty 50

## 2020-06-20 MED ORDER — KATE FARMS STANDARD 1.4 PO LIQD
325.0000 mL | Freq: Two times a day (BID) | ORAL | Status: DC
  Administered 2020-06-20 – 2020-06-21 (×2): 325 mL via ORAL
  Filled 2020-06-20 (×11): qty 325

## 2020-06-20 NOTE — Progress Notes (Signed)
PROGRESS NOTE    Justin Rosales  GUR:[id number] DOB: [birth year] DOA: 06/18/2020 PCP: Justin Pal, DO  Brief Narrative:  HPI Per Dr. Jennette Kettle on 06/18/20 Justin Rosales is a 69 y.o. male with medical history significant of UC, HTN.  Pt with diarrhea for past couple of weeks, diagnosed with C. Diff based on GI pathogen pnl on 11/16.  Treatment with PO vanc started 11/19.  Diarrhea initially bloody, now brown.  Diarrhea has persisted since starting treatment.  Continues to have intermittent fevers.  BPs running on low side despite not taking BP meds.  Back in to ED today.   ED Course: Tm 100.5, BP 28-315 systolic.  Sodium 128.  WBC 12.6.  COVID neg.  CT abd pelvis shows colitis.  Pt started on high dose PO vanc and IV flagyl, and sent for admission.  **Interim History She thinks that his diarrhea is slowly improving.  States that the 3 bowel movements that he had overnight 2 of them were bloody.  No nausea or vomiting.  Thinks is doing a little bit better but not still back to his baseline.  Continues to tolerate p.o. Flagyl and p.o. vancomycin for now.  Gastroenterology following appreciate further evaluation recommendations.  Assessment & Plan:   Principal Problem:   C. difficile colitis Active Problems:   Ulcerative colitis with complication ([place])   Essential hypertension   Anemia   Sepsis ([place])  Sepsis secondary to C.Diff colitis, acute recurrent, POA -Patient met sepsis criteria on admission as he was febrile with a temperature of 101.4, tachycardic at a rate of 106, had a leukocytosis of 12.6 and a source of infection with C. Difficile -Sepsis Physiology is improving but he was febrile yesterday  -Continue p.o. Vanco per protocol on MAR and p.o. Flagyl, IV Flagyl shortage unfortunately unavailable and he is able to take p.o. so we will continue p.o. Flagyl -CT Abd/Pelvis showed "Left colon is diffusely distended and featureless with mild circumferential  wall thickening starting the distal transverse colon through the splenic flexure down to the rectum mild associated pericolonic edema/inflammation. Imaging features are compatible with infectious/inflammatory colitis. Bilateral renal cysts. Aortic Atherosclerosis" -KUB showed "Progressive gaseous distension of the colon suggestive of worsening colonic ileus. Signs of bowel wall edema involving the left colon compatible with colitis." -Patient's CRP is 12.3 and ESR was 115 on admission -GI following, appreciate insight recommendations; GI recommending that if he is not improving they can switch him to Dificid or consider a flexible sigmoidoscopy -His diarrhea has slowed down but not completely resolved.  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  If he drops below 7 will transfuse PRBC -May resume oral Iron daily but will defer  to GI  -Continue to monitor for signs and symptoms of bleeding; currently no overt bleeding  -Repeat CBC in the a.m.  Colonic Ileus -KUB showed "Progressive gaseous distension of the colon suggestive of worsening colonic ileus. Signs of bowel wall edema involving the left colon compatible with  colitis." -C/w OOB and Ambulation -K+ was 4.5; Check Mag Level -Ca2+ was 7.4 and will replete with IV Calcium Gluconate 1 gram; Corrected fro Albumin Ca2+ was 9.1 -Continue to Monitor and Trend Electrolytes   Thrombocytosis  -Likely reactive in the setting of above -Patient's platelet went from 432,000 and is now 470,000 -Continue to monitor and trend and repeat CBC in the a.m.  Hyponatremia -Mild on admission at 128 and now improved slowly -Patient sodium went from 128 ->131 is now 130 -Continue monitor and trend repeat CMP in a.m.  HTN, Essential -Borderline hypotension, currently holding home BP meds -Continue to Monitor BP per Protocol  -Last BP was 123/58  UC  -He has longstanding left-sided ulcerative colitis and was in clinical endoscopic remission per GI until a few weeks ago -Defer any need for treatment to GI and now is on Sulfasalazine 1000 mg po TID  Obesity -Complicates overall prognosis and care -Estimated body mass index is 30.4 kg/m as calculated from the following:   Height as of this encounter: 5' 10" (1.778 m).   Weight as of this encounter: 96.1 kg. -Weight Loss and Dietary Counseling given -Nutritionist consulted and recommending Boost Breeze BID, Prosource Plus BID, MVI+Minerals 1 tab Daily   Lower Extremity Swelling -Likely in setting of poor nutritional status as his albumin is 1.9 next-reduce IV fluid hydration to 75 MLS per hour for 1 day -If continues to persist and not improvement will obtain lower extremity venous duplex to rule out DVT  DVT prophylaxis: SCDs given Bleeding  Code Status: FULL CODE  Family Communication: No family present at bedside  Disposition Plan: Pending further clinical improvement, stablization of Hb and tolerance of po Diet as well as reduced BM prior to safe discharge disposition  Status is: Inpatient  Remains inpatient appropriate because:Unsafe d/c plan, IV treatments appropriate due to intensity of illness or  inability to take PO and Inpatient level of care appropriate due to severity of illness   Dispo: The patient is from: Home              Anticipated d/c is to: Home              Anticipated d/c date is: 2 days              Patient currently is not medically stable to d/c.  Consultants:   Gastroenterology  Procedures: CT Abdomen/Pelvis; KUB  Antimicrobials:  Anti-infectives (From admission, onward)   Start     Dose/Rate Route Frequency Ordered Stop   07/26/20 1000  vancomycin (VANCOCIN) 50 mg/mL oral solution 125 mg       "Followed by" Linked Group Details   125 mg Oral Every 3 DAYS 06/19/20 1054 08/04/20 0959   07/18/20 1000  vancomycin (VANCOCIN) 50 mg/mL oral solution 125 mg       "Followed by" Linked Group Details   125 mg Oral Every other day 06/19/20 1054 07/26/20 0959   07/11/20 1000  vancomycin (VANCOCIN) 50 mg/mL oral solution 125 mg       "Followed by" Linked Group Details   125 mg Oral Daily 06/19/20 1054 07/18/20 0959   07/03/20 2200  vancomycin (VANCOCIN) 50 mg/mL oral solution  125 mg       "Followed by" Linked Group Details   125 mg Oral 2 times daily 06/19/20 1054 07/10/20 2159   06/19/20 1400  vancomycin (VANCOCIN) 50 mg/mL oral solution 125 mg       "Followed by" Linked Group Details   125 mg Oral 4 times daily 06/19/20 1054 07/03/20 1359   06/19/20 1400  metroNIDAZOLE (FLAGYL) tablet 500 mg        500 mg Oral Every 8 hours 06/19/20 1135     06/18/20 1800  vancomycin (VANCOCIN) 50 mg/mL oral solution 500 mg  Status:  Discontinued        500 mg Oral Every 6 hours 06/18/20 1543 06/19/20 1053   06/18/20 1545  metroNIDAZOLE (FLAGYL) IVPB 500 mg  Status:  Discontinued        500 mg 100 mL/hr over 60 Minutes Intravenous Every 8 hours 06/18/20 1543 06/19/20 1053        Subjective: Seen and examined at bedside and he is doing okay.  Denies any nausea or vomiting but did not feel the best.  States that he continues to have some diarrhea but thinks is slowing down  some.  Feels that the last bowel movement was not bloody but states that prior to work.  No chest pain, lightheadedness.  No other concerns at this time.  Lower extremity swelling was noted so fluids were reduced and he was encouraged to ambulate.  Objective: Vitals:   06/19/20 1616 06/19/20 1750 06/19/20 2017 06/20/20 0620  BP: (!) 137/58  121/60 (!) 123/58  Pulse: 97  (!) 101 96  Resp: _0 Temp: 99.7 F (37.6 C) (!) 100.7 F (38.2 C) 99.6 F (37.6 C) 98.1 F (36.7 C)  TempSrc: Oral Oral Oral Oral  SpO2: 95%  97% 97%  Weight:      Height:       No intake or output data in the 24 hours ending 06/20/20 1305 Filed Weights   06/18/20 1146 06/18/20 1757  Weight: 99.8 kg 96.1 kg    Examination: Physical Exam:  Constitutional: WN/WD obese Caucasian male in NAD and appears calm but slightly uncomfortable Eyes: Lids and conjunctivae normal, sclerae anicteric  ENMT: External Ears, Nose appear normal. Grossly normal hearing.  Neck: Appears normal, supple, no cervical masses, normal ROM, no appreciable thyromegaly; no JVD Respiratory: Diminished to auscultation bilaterally, no wheezing, rales, rhonchi or crackles. Normal respiratory effort and patient is not tachypenic. No accessory muscle use.  Cardiovascular: RRR, no murmurs / rubs / gallops. S1 and S2 auscultated. 1+ LE edema Abdomen: Soft, somewhat tender, Distended 2/2 to body habitus. Bowel sounds positive and slightly hyperactive. Has tympanic sounds to percussion GU: Deferred. Musculoskeletal: No clubbing / cyanosis of digits/nails. No joint deformity upper and lower extremities but he has 1+ LE edema Skin: No rashes, lesions, ulcers on a limited skin evaluation. No induration; Warm and dry.  Neurologic: CN 2-12 grossly intact with no focal deficits. Romberg sign and cerebellar reflexes not assessed.  Psychiatric: Normal judgment and insight. Alert and oriented x 3. Normal mood and appropriate affect.   Data Reviewed: I  have personally reviewed following labs and imaging studies  CBC: Recent Labs  Lab 06/18/20 1158 06/19/20 0724 06/20/20 0650  WBC 12.6* 10.5 9.0  HGB 8.2* 7.9* 7.6*  HCT 26.1* 25.9* 24.1*  MCV 85.0 87.8 86.4  PLT 513* 432* 300*   Basic Metabolic Panel: Recent Labs  Lab 06/18/20 1158 06/19/20 0724 06/20/20 0650  NA 128* 131* 130*  K 4.5 4.6 4.5  CL 92* 99 98  CO2 _0 GLUCOSE 126* 107* 149*  BUN _1 CREATININE 1.13 0.97 0.94  CALCIUM 8.0* 7.7* 7.4*   GFR: Estimated Creatinine Clearance: 86.2 mL/min (by C-G formula based on SCr of 0.94 mg/dL). Liver Function Tests: Recent Labs  Lab 06/18/20 1158 06/20/20 0650  AST 51* 20  ALT 54* 34  ALKPHOS 57 45  BILITOT 0.2* 0.5  PROT 5.9* 4.8*  ALBUMIN 2.0* 1.9*   Recent Labs  Lab 06/18/20 1158  LIPASE 23   No results for input(s): AMMONIA in the last 168 hours. Coagulation Profile: No results for input(s): INR, PROTIME in the last 168 hours. Cardiac Enzymes: No results for input(s): CKTOTAL, CKMB, CKMBINDEX, TROPONINI in the last 168 hours. BNP (last 3 results) No results for input(s): PROBNP in the last 8760 hours. HbA1C: No results for input(s): HGBA1C in the last 72 hours. CBG: No results for input(s): GLUCAP in the last 168 hours. Lipid Profile: No results for input(s): CHOL, HDL, LDLCALC, TRIG, CHOLHDL, LDLDIRECT in the last 72 hours. Thyroid Function Tests: No results for input(s): TSH, T4TOTAL, FREET4, T3FREE, THYROIDAB in the last 72 hours. Anemia Panel: Recent Labs    06/19/20 1235  FERRITIN 370*  TIBC 122*  IRON 14*   Sepsis Labs: Recent Labs  Lab 06/18/20 1338  LATICACIDVEN 0.9    Recent Results (from the past 240 hour(s))  Blood culture (routine x 2)     Status: None (Preliminary result)   Collection Time: 06/18/20  1:30 PM   Specimen: BLOOD  Result Value Ref Range Status   Specimen Description   Final    BLOOD RIGHT ANTECUBITAL Performed at [facility], [place]  [address], [place], [place] [address]    Special Requests   Final    BOTTLES DRAWN AEROBIC AND ANAEROBIC Blood Culture adequate volume Performed at [facility], [address] [place], [place] [address]    Culture   Final    NO GROWTH 2 DAYS Performed at [facility], [place] [address] [place], [place] [address]    Report Status PENDING  Incomplete  Resp Panel by RT-PCR (Flu A&B, Covid) Nasopharyngeal Swab     Status: None   Collection Time: 06/18/20  1:38 PM   Specimen: Nasopharyngeal Swab; Nasopharyngeal(NP) swabs in vial transport medium  Result Value Ref Range Status   SARS Coronavirus 2 by RT PCR NEGATIVE NEGATIVE Final    Comment: (NOTE) SARS-CoV-2 target nucleic acids are NOT DETECTED.  The SARS-CoV-2 RNA is generally detectable in upper respiratory specimens during the acute phase of infection. The lowest concentration of SARS-CoV-2 viral copies this assay can detect is 138 copies/mL. A negative result does not preclude SARS-Cov-2 infection and should not be used as the sole basis for treatment or other patient management decisions. A negative result may occur with  improper specimen collection/handling, submission of specimen other than nasopharyngeal swab, presence of viral mutation(s) within the areas targeted by this assay, and inadequate number of viral copies(<138 copies/mL). A negative result must be combined with clinical observations, patient history, and epidemiological information. The expected result is Negative.  Fact Sheet for Patients:  EntrepreneurPulse.com.au  Fact Sheet for Healthcare Providers:  IncredibleEmployment.be  This test is no t yet approved or cleared by the [place] FDA and  has been authorized for detection and/or diagnosis of SARS-CoV-2 by FDA under an Emergency  Use Authorization (EUA). This EUA will remain  in effect (meaning this test can be used) for the duration of the COVID-19  declaration under Section 564(b)(1) of the Act, 21 U.S.C.section 360bbb-3(b)(1), unless the authorization is terminated  or revoked sooner.       Influenza A by PCR NEGATIVE NEGATIVE Final   Influenza B by PCR NEGATIVE NEGATIVE Final    Comment: (NOTE) The Xpert Xpress SARS-CoV-2/FLU/RSV plus assay is intended as an aid in the diagnosis of influenza from Nasopharyngeal swab specimens and should not be used as a sole basis for treatment. Nasal washings and aspirates are unacceptable for Xpert Xpress SARS-CoV-2/FLU/RSV testing.  Fact Sheet for Patients: EntrepreneurPulse.com.au  Fact Sheet for Healthcare Providers: IncredibleEmployment.be  This test is not yet approved or cleared by the [place] FDA and has been authorized for detection and/or diagnosis of SARS-CoV-2 by FDA under an Emergency Use Authorization (EUA). This EUA will remain in effect (meaning this test can be used) for the duration of the COVID-19 declaration under Section 564(b)(1) of the Act, 21 U.S.C. section 360bbb-3(b)(1), unless the authorization is terminated or revoked.  Performed at [facility], [address] [place], [place] [address]   Blood culture (routine x 2)     Status: None (Preliminary result)   Collection Time: 06/18/20  1:40 PM   Specimen: BLOOD RIGHT FOREARM  Result Value Ref Range Status   Specimen Description   Final    BLOOD RIGHT FOREARM Performed at [facility], [address] [place], [place] [address]    Special Requests   Final    BOTTLES DRAWN AEROBIC AND ANAEROBIC Blood Culture adequate volume Performed at [facility], [address] [place], [place] [address]    Culture   Final    NO GROWTH 2 DAYS Performed at [facility], [place] [address], [place], [place] [address]    Report Status PENDING  Incomplete     RN Pressure Injury Documentation:     Estimated body mass index is 30.4 kg/m as  calculated from the following:   Height as of this encounter: 5' 10" (1.778 m).   Weight as of this encounter: 96.1 kg.  Malnutrition Type:  Nutrition Problem: Increased nutrient needs Etiology: acute illness   Malnutrition Characteristics:  Signs/Symptoms: estimated needs   Nutrition Interventions:  Interventions: MVI, Boost Breeze, Prostat     Radiology Studies: CT ABDOMEN PELVIS W CONTRAST  Result Date: 06/18/2020 CLINICAL DATA:  Diarrhea.  History of ulcerative colitis. EXAM: CT ABDOMEN AND PELVIS WITH CONTRAST TECHNIQUE: Multidetector CT imaging of the abdomen and pelvis was performed using the standard protocol following bolus administration of intravenous contrast. CONTRAST:  142m OMNIPAQUE IOHEXOL 300 MG/ML  SOLN COMPARISON:  No comparison studies available. FINDINGS: Lower chest: Unremarkable. Hepatobiliary: No suspicious focal abnormality within the liver parenchyma. There is no evidence for gallstones, gallbladder wall thickening, or pericholecystic fluid. No intrahepatic or extrahepatic biliary dilation. Pancreas: No focal mass lesion. No dilatation of the main duct. No intraparenchymal cyst. No peripancreatic edema. Spleen: No splenomegaly. No focal mass lesion. Adrenals/Urinary Tract: No adrenal nodule or mass. Exophytic low-density lesions lower pole right kidney are compatible with cyst. 5.6 cm low-density lesion lower interpolar left kidney is compatible with a cyst. Other scattered left renal cysts are associated with tiny left renal lesions too small to characterize but likely benign. No evidence for hydroureter. The urinary bladder appears normal for the degree of distention. Stomach/Bowel:  Stomach is unremarkable. No gastric wall thickening. No evidence of outlet obstruction. Duodenum is normally positioned as is the ligament of Treitz. No small bowel wall thickening. No small bowel dilatation. The terminal ileum is normal. The appendix is normal. Right colon and  hepatic flexure unremarkable. Left colon is diffusely distended and featureless with mild circumferential wall thickening. Abnormal segment of colon starts in the distal transverse colon through the splenic flexure down to the rectum. Left colonic wall is ill-defined and there is subtle pericolonic edema/inflammation. Vascularity of the left colon is engorged. Vascular/Lymphatic: There is abdominal aortic atherosclerosis without aneurysm. There is no gastrohepatic or hepatoduodenal ligament lymphadenopathy. No retroperitoneal or mesenteric lymphadenopathy. No pelvic sidewall lymphadenopathy. Reproductive: The prostate gland and seminal vesicles are unremarkable. Other: No intraperitoneal free fluid. Musculoskeletal: No worrisome lytic or sclerotic osseous abnormality. IMPRESSION: 1. Left colon is diffusely distended and featureless with mild circumferential wall thickening starting the distal transverse colon through the splenic flexure down to the rectum mild associated pericolonic edema/inflammation. Imaging features are compatible with infectious/inflammatory colitis 2. Bilateral renal cysts. 3. Aortic Atherosclerosis (ICD10-I70.0). Electronically Signed   By: Misty Stanley M.D.   On: 06/18/2020 15:02   DG Abd 2 Views  Result Date: 06/19/2020 CLINICAL DATA:  Persistent abdominal distension. EXAM: ABDOMEN - 2 VIEW COMPARISON:  None. FINDINGS: Progressive gaseous distension of the colon. No dilated small bowel loops identified. No signs of free air. Signs of bowel wall edema and loss of normal haustral markings noted involving the left colon compatible with colitis. IMPRESSION: 1. Progressive gaseous distension of the colon suggestive of worsening colonic ileus. 2. Signs of bowel wall edema involving the left colon compatible with colitis. Electronically Signed   By: Kerby Moors M.D.   On: 06/19/2020 09:13   Scheduled Meds: . feeding supplement (KATE [employer]ARMS STANDARD 1.4)  325 mL Oral BID BM  .  metroNIDAZOLE  500 mg Oral Q8H  . multivitamin with minerals  1 tablet Oral Daily  . saccharomyces boulardii  250 mg Oral BID  . sulfaSALAzine  1,000 mg Oral TID PC  . vancomycin  125 mg Oral QID   Followed by  . [START ON 07/03/2020] vancomycin  125 mg Oral BID   Followed by  . [START ON 07/11/2020] vancomycin  125 mg Oral Daily   Followed by  . [START ON 07/18/2020] vancomycin  125 mg Oral QODAY   Followed by  . [START ON 07/26/2020] vancomycin  125 mg Oral Q3 days   Continuous Infusions: . sodium chloride 75 mL/hr at 06/20/20 1034    LOS: 2 days   Kerney Elbe, DO [facility] PAGER is on AMION  If 7PM-7AM, please contact night-coverage [email or web address]

## 2020-06-20 NOTE — Progress Notes (Signed)
Nutrition Follow-up  DOCUMENTATION CODES:   Obesity unspecified  INTERVENTION:   -[employer] 1.4 PO BID, each provides 455 kcals and 20g protein -Daily snack: greek yogurt   NUTRITION DIAGNOSIS:   Increased nutrient needs related to acute illness as evidenced by estimated needs.  Ongoing.  GOAL:   Patient will meet greater than or equal to 90% of their needs  Progressing.  MONITOR:   PO intake, Supplement acceptance, Labs, Weight trends  REASON FOR ASSESSMENT:   Consult Assessment of nutrition requirement/status  ASSESSMENT:   69 y.o. male with medical history of UC, HTN, arthritis, and anemia. He presented to the ED due to diarrhea for several weeks, diagnosed with C. Diff based on GI pathogen on 11/16. He continues to have intermittent fevers.  Patient in room with son at bedside. Pt reports he tried to eat an omelet this morning but it was cold. Reviewed good protein sources with patient. Pt's son states he drinks Orgain at home. Pt was agreeable to trying [employer] [place] for protein. Does not like Boost Breeze, and will d/c Prosource as well.   Pt states his stools had improved. Pt is c.diff+.  Pt has had poor appetite for weeks, started around October.   Per weight records, pt has lost 28 lbs since December 2020 (11% wt loss x 11.5 months, insignificant for time frame). Pt reports weighing around 240 lbs at that time.   Medications: Multivitamin with minerals daily, Florastor  Labs reviewed: CBGs: 130  NUTRITION - FOCUSED PHYSICAL EXAM:    Most Recent Value  Orbital Region No depletion  Upper Arm Region Mild depletion  Thoracic and Lumbar Region Unable to assess  Buccal Region No depletion  Temple Region Mild depletion  Clavicle Bone Region Mild depletion  Clavicle and Acromion Bone Region No depletion  Scapular Bone Region No depletion  Dorsal Hand No depletion  Patellar Region Unable to assess  Anterior Thigh Region Unable to assess  Posterior Calf  Region Unable to assess  Edema (RD Assessment) None       Diet Order:   Diet Order            Diet Heart Room service appropriate? Yes; Fluid consistency: Thin  Diet effective now                 EDUCATION NEEDS:   No education needs have been identified at this time  Skin:  Skin Assessment: Reviewed RN Assessment  Last BM:  11/29  Height:   Ht Readings from Last 1 Encounters:  06/18/20 5' 10"  (1.778 m)    Weight:   Wt Readings from Last 1 Encounters:  06/18/20 96.1 kg   BMI:  Body mass index is 30.4 kg/m.  Estimated Nutritional Needs:   Kcal:  2100-2300 kcal  Protein:  100-115 grams  Fluid:  >/= 2.5 L/day  Clayton Bibles, MS, RD, LDN Inpatient Clinical Dietitian Contact information available via Amion

## 2020-06-20 NOTE — Progress Notes (Signed)
Progress Note  Chief Complaint:    C-diff and ulcerative colitis     ASSESSMENT / PLAN:    # C-diff colitis causing sepsis. Long standing left sided UC but was in clinical and endoscopic remission on Sulfasalazine until a few weeks ago. Recently diagnosed with C-diff. No outpatient improvement after a week of oral vancomycin. Now on oral Vancomycin + Flagyl  Hopefully this is all C-diff related and he is not having UC flare. Temp 100.7 yesterday, afebrile now. WBC normal. He is improving. Has had only 3 BMs since I saw him yesterday afternoon. I so suspect he is developing an ileus. --Since improving will continue oral Vancomycin QID and PO flagyl.  If he doesn't continue to improve then can switch him to Dificid and / or consider flexible sigmoidoscopy. --Continue BID Florastor --Continue home Azulfidine 1000 mg TID   # Normocytic anemia. Hgb 10.9 on 11/15, down to 8.2 on admission >>> 7.9 >>> 7.6 today. Nurses reported blood in a couple of BMs in last 24 hours.  Has degree of iron deficiency by labs. --should get dose of IV iron prior to discharge. He may require PRBC if continues to decline.   # Probable ileus, not unexpected. Abdomen is distended with tympany and a few abnormal bowel sounds. Mild nausea around 3 am. Tolerating PO.  --Recommended he move about, be OOB as much as possible --Keep check on electrolytes --Thankfully he hasn't required n narcotics     SUBJECTIVE:    Only 3 episodes of diarrhea since I saw him yesterday. No abdominal pain    OBJECTIVE:    Scheduled inpatient medications:  . (feeding supplement) PROSource Plus  30 mL Oral BID BM  . feeding supplement  1 Container Oral BID BM  . metroNIDAZOLE  500 mg Oral Q8H  . multivitamin with minerals  1 tablet Oral Daily  . saccharomyces boulardii  250 mg Oral BID  . sulfaSALAzine  1,000 mg Oral TID PC  . vancomycin  125 mg Oral QID   Followed by  . [START ON 07/03/2020] vancomycin  125 mg Oral BID    Followed by  . [START ON 07/11/2020] vancomycin  125 mg Oral Daily   Followed by  . [START ON 07/18/2020] vancomycin  125 mg Oral QODAY   Followed by  . [START ON 07/26/2020] vancomycin  125 mg Oral Q3 days   Continuous inpatient infusions:  . sodium chloride 75 mL/hr at 06/20/20 1034   PRN inpatient medications: acetaminophen **OR** acetaminophen, ondansetron **OR** ondansetron (ZOFRAN) IV  Vital signs in last 24 hours: Temp:  [98.1 F (36.7 C)-100.7 F (38.2 C)] 98.1 F (36.7 C) (12/01 0620) Pulse Rate:  [96-101] 96 (12/01 0620) Resp:  [16] 16 (12/01 0620) BP: (121-137)/(58-60) 123/58 (12/01 0620) SpO2:  [95 %-97 %] 97 % (12/01 0620) Last BM Date: 06/19/20 No intake or output data in the 24 hours ending 06/20/20 1137   Physical Exam:  . General: Alert, male in NAD . Heart:  Regular rate and rhythm.  . Pulmonary: Normal respiratory effort . Abdomen: Soft, moderately distended, tympanitic, a few abnormal bowel sounds. Nontender.   . Neurologic: Alert and oriented . Psych: Pleasant. Cooperative.   Filed Weights   06/18/20 1146 06/18/20 1757  Weight: 99.8 kg 96.1 kg    Intake/Output from previous day: 11/30 0701 - 12/01 0700 In: 240 [P.O.:240] Out: -  Intake/Output this shift: No intake/output data recorded.    Lab Results: Recent Labs  06/18/20 1158 06/19/20 0724 06/20/20 0650  WBC 12.6* 10.5 9.0  HGB 8.2* 7.9* 7.6*  HCT 26.1* 25.9* 24.1*  PLT 513* 432* 417*   BMET Recent Labs    06/18/20 1158 06/19/20 0724 06/20/20 0650  NA 128* 131* 130*  K 4.5 4.6 4.5  CL 92* 99 98  CO2 26 22 24   GLUCOSE 126* 107* 149*  BUN 22 14 12   CREATININE 1.13 0.97 0.94  CALCIUM 8.0* 7.7* 7.4*   LFT Recent Labs    06/20/20 0650  PROT 4.8*  ALBUMIN 1.9*  AST 20  ALT 34  ALKPHOS 45  BILITOT 0.5   PT/INR No results for input(s): LABPROT, INR in the last 72 hours. Hepatitis Panel No results for input(s): HEPBSAG, HCVAB, HEPAIGM, HEPBIGM in the last 72  hours.  CT ABDOMEN PELVIS W CONTRAST  Result Date: 06/18/2020 CLINICAL DATA:  Diarrhea.  History of ulcerative colitis. EXAM: CT ABDOMEN AND PELVIS WITH CONTRAST TECHNIQUE: Multidetector CT imaging of the abdomen and pelvis was performed using the standard protocol following bolus administration of intravenous contrast. CONTRAST:  157m OMNIPAQUE IOHEXOL 300 MG/ML  SOLN COMPARISON:  No comparison studies available. FINDINGS: Lower chest: Unremarkable. Hepatobiliary: No suspicious focal abnormality within the liver parenchyma. There is no evidence for gallstones, gallbladder wall thickening, or pericholecystic fluid. No intrahepatic or extrahepatic biliary dilation. Pancreas: No focal mass lesion. No dilatation of the main duct. No intraparenchymal cyst. No peripancreatic edema. Spleen: No splenomegaly. No focal mass lesion. Adrenals/Urinary Tract: No adrenal nodule or mass. Exophytic low-density lesions lower pole right kidney are compatible with cyst. 5.6 cm low-density lesion lower interpolar left kidney is compatible with a cyst. Other scattered left renal cysts are associated with tiny left renal lesions too small to characterize but likely benign. No evidence for hydroureter. The urinary bladder appears normal for the degree of distention. Stomach/Bowel: Stomach is unremarkable. No gastric wall thickening. No evidence of outlet obstruction. Duodenum is normally positioned as is the ligament of Treitz. No small bowel wall thickening. No small bowel dilatation. The terminal ileum is normal. The appendix is normal. Right colon and hepatic flexure unremarkable. Left colon is diffusely distended and featureless with mild circumferential wall thickening. Abnormal segment of colon starts in the distal transverse colon through the splenic flexure down to the rectum. Left colonic wall is ill-defined and there is subtle pericolonic edema/inflammation. Vascularity of the left colon is engorged. Vascular/Lymphatic:  There is abdominal aortic atherosclerosis without aneurysm. There is no gastrohepatic or hepatoduodenal ligament lymphadenopathy. No retroperitoneal or mesenteric lymphadenopathy. No pelvic sidewall lymphadenopathy. Reproductive: The prostate gland and seminal vesicles are unremarkable. Other: No intraperitoneal free fluid. Musculoskeletal: No worrisome lytic or sclerotic osseous abnormality. IMPRESSION: 1. Left colon is diffusely distended and featureless with mild circumferential wall thickening starting the distal transverse colon through the splenic flexure down to the rectum mild associated pericolonic edema/inflammation. Imaging features are compatible with infectious/inflammatory colitis 2. Bilateral renal cysts. 3. Aortic Atherosclerosis (ICD10-I70.0). Electronically Signed   By: EMisty StanleyM.D.   On: 06/18/2020 15:02   DG Abd 2 Views  Result Date: 06/19/2020 CLINICAL DATA:  Persistent abdominal distension. EXAM: ABDOMEN - 2 VIEW COMPARISON:  None. FINDINGS: Progressive gaseous distension of the colon. No dilated small bowel loops identified. No signs of free air. Signs of bowel wall edema and loss of normal haustral markings noted involving the left colon compatible with colitis. IMPRESSION: 1. Progressive gaseous distension of the colon suggestive of worsening colonic ileus. 2. Signs of bowel  wall edema involving the left colon compatible with colitis. Electronically Signed   By: Kerby Moors M.D.   On: 06/19/2020 09:13      Principal Problem:   C. difficile colitis Active Problems:   Ulcerative colitis with complication ([place])   Essential hypertension   Anemia   Sepsis ([place])     LOS: 2 days   Tye Savoy ,NP 06/20/2020, 11:37 AM

## 2020-06-21 ENCOUNTER — Inpatient Hospital Stay (HOSPITAL_BASED_OUTPATIENT_CLINIC_OR_DEPARTMENT_OTHER): Payer: Medicare Other

## 2020-06-21 ENCOUNTER — Encounter: Payer: Self-pay | Admitting: Gastroenterology

## 2020-06-21 ENCOUNTER — Inpatient Hospital Stay (HOSPITAL_COMMUNITY): Payer: Medicare Other

## 2020-06-21 DIAGNOSIS — R609 Edema, unspecified: Secondary | ICD-10-CM

## 2020-06-21 DIAGNOSIS — D649 Anemia, unspecified: Secondary | ICD-10-CM | POA: Diagnosis not present

## 2020-06-21 DIAGNOSIS — I10 Essential (primary) hypertension: Secondary | ICD-10-CM | POA: Diagnosis not present

## 2020-06-21 DIAGNOSIS — A419 Sepsis, unspecified organism: Secondary | ICD-10-CM | POA: Diagnosis not present

## 2020-06-21 DIAGNOSIS — A0472 Enterocolitis due to Clostridium difficile, not specified as recurrent: Secondary | ICD-10-CM | POA: Diagnosis not present

## 2020-06-21 LAB — CBC WITH DIFFERENTIAL/PLATELET
Abs Immature Granulocytes: 0.2 10*3/uL — ABNORMAL HIGH (ref 0.00–0.07)
Basophils Absolute: 0.1 10*3/uL (ref 0.0–0.1)
Basophils Relative: 1 %
Eosinophils Absolute: 0.2 10*3/uL (ref 0.0–0.5)
Eosinophils Relative: 2 %
HCT: 25 % — ABNORMAL LOW (ref 39.0–52.0)
Hemoglobin: 7.6 g/dL — ABNORMAL LOW (ref 13.0–17.0)
Immature Granulocytes: 2 %
Lymphocytes Relative: 22 %
Lymphs Abs: 2.3 10*3/uL (ref 0.7–4.0)
MCH: 26.5 pg (ref 26.0–34.0)
MCHC: 30.4 g/dL (ref 30.0–36.0)
MCV: 87.1 fL (ref 80.0–100.0)
Monocytes Absolute: 1.1 10*3/uL — ABNORMAL HIGH (ref 0.1–1.0)
Monocytes Relative: 10 %
Neutro Abs: 6.5 10*3/uL (ref 1.7–7.7)
Neutrophils Relative %: 63 %
Platelets: 432 10*3/uL — ABNORMAL HIGH (ref 150–400)
RBC: 2.87 MIL/uL — ABNORMAL LOW (ref 4.22–5.81)
RDW: 15.8 % — ABNORMAL HIGH (ref 11.5–15.5)
WBC: 10.4 10*3/uL (ref 4.0–10.5)
nRBC: 0.4 % — ABNORMAL HIGH (ref 0.0–0.2)

## 2020-06-21 LAB — HEPARIN LEVEL (UNFRACTIONATED): Heparin Unfractionated: <0.1 IU/mL — ABNORMAL LOW (ref 0.30–0.70)

## 2020-06-21 LAB — COMPREHENSIVE METABOLIC PANEL
ALT: 25 U/L (ref 0–44)
AST: 15 U/L (ref 15–41)
Albumin: 1.9 g/dL — ABNORMAL LOW (ref 3.5–5.0)
Alkaline Phosphatase: 43 U/L (ref 38–126)
Anion gap: 8 (ref 5–15)
BUN: 9 mg/dL (ref 8–23)
CO2: 23 mmol/L (ref 22–32)
Calcium: 7.5 mg/dL — ABNORMAL LOW (ref 8.9–10.3)
Chloride: 101 mmol/L (ref 98–111)
Creatinine, Ser: 0.94 mg/dL (ref 0.61–1.24)
GFR, Estimated: >60 mL/min (ref >60)
Glucose, Bld: 126 mg/dL — ABNORMAL HIGH (ref 70–99)
Potassium: 4.2 mmol/L (ref 3.5–5.1)
Sodium: 132 mmol/L — ABNORMAL LOW (ref 135–145)
Total Bilirubin: 0.4 mg/dL (ref 0.3–1.2)
Total Protein: 4.8 g/dL — ABNORMAL LOW (ref 6.5–8.1)

## 2020-06-21 LAB — PHOSPHORUS: Phosphorus: 3.1 mg/dL (ref 2.5–4.6)

## 2020-06-21 LAB — MAGNESIUM: Magnesium: 1.9 mg/dL (ref 1.7–2.4)

## 2020-06-21 MED ORDER — SODIUM CHLORIDE 0.9 % IV SOLN
500.0000 mg | Freq: Once | INTRAVENOUS | Status: AC
  Administered 2020-06-21: 500 mg via INTRAVENOUS
  Filled 2020-06-21: qty 25

## 2020-06-21 MED ORDER — HEPARIN BOLUS VIA INFUSION
3000.0000 [IU] | Freq: Once | INTRAVENOUS | Status: AC
  Administered 2020-06-21: 3000 [IU] via INTRAVENOUS
  Filled 2020-06-21: qty 3000

## 2020-06-21 MED ORDER — HEPARIN (PORCINE) 25000 UT/250ML-% IV SOLN
2100.0000 [IU]/h | INTRAVENOUS | Status: DC
  Administered 2020-06-21: 1400 [IU]/h via INTRAVENOUS
  Administered 2020-06-23 – 2020-06-26 (×5): 2100 [IU]/h via INTRAVENOUS
  Filled 2020-06-21 (×9): qty 250

## 2020-06-21 MED ORDER — CALCIUM CARBONATE ANTACID 500 MG PO CHEW
1.0000 | CHEWABLE_TABLET | Freq: Two times a day (BID) | ORAL | Status: DC
  Administered 2020-06-21 – 2020-06-27 (×12): 200 mg via ORAL
  Filled 2020-06-21 (×12): qty 1

## 2020-06-21 NOTE — Progress Notes (Signed)
Progress Note   Subjective  Chief Complaint: C. difficile and ulcerative colitis  Today, the patient tells me that he continues to improve overnight, he has had 3 loose stools as of this morning but went for a good 8-hour time.  Yesterday with no bowel movements at all.  Everything is very much improved from where he was a few days ago.  He is found with his wife by his bedside who does ask questions regarding the infectious nature of C. difficile and how much she needs to clean it as well as what the x-ray looks like yesterday.   Objective   Vital signs in last 24 hours: Temp:  [98.3 F (36.8 C)-98.5 F (36.9 C)] 98.5 F (36.9 C) (12/02 0535) Pulse Rate:  [93-101] 93 (12/02 0535) Resp:  [16-17] 16 (12/02 0535) BP: (121-126)/(60-63) 122/60 (12/02 0535) SpO2:  [98 %-100 %] 99 % (12/02 0535) Last BM Date: 06/20/20 General:    Elderly white male in NAD Heart:  Regular rate and rhythm; no murmurs Lungs: Respirations even and unlabored, lungs CTA bilaterally Abdomen:  Soft, nontender and nondistended. Normal bowel sounds. Extremities:  Without edema. Neurologic:  Alert and oriented,  grossly normal neurologically. Psych:  Cooperative. Normal mood and affect.  Intake/Output from previous day: 12/01 0701 - 12/02 0700 In: 994.5 [P.O.:440; I.V.:550.5; IV Piggyback:4] Out: -  Intake/Output this shift: Total I/O In: 240 [P.O.:240] Out: -   Lab Results: Recent Labs    06/19/20 0724 06/20/20 0650 06/21/20 0620  WBC 10.5 9.0 10.4  HGB 7.9* 7.6* 7.6*  HCT 25.9* 24.1* 25.0*  PLT 432* 417* 432*   BMET Recent Labs    06/19/20 0724 06/20/20 0650 06/21/20 0620  NA 131* 130* 132*  K 4.6 4.5 4.2  CL 99 98 101  CO2 22 24 23   GLUCOSE 107* 149* 126*  BUN 14 12 9   CREATININE 0.97 0.94 0.94  CALCIUM 7.7* 7.4* 7.5*   LFT Recent Labs    06/21/20 0620  PROT 4.8*  ALBUMIN 1.9*  AST 15  ALT 25  ALKPHOS 43  BILITOT 0.4   Studies/Results: DG Abd 1 View  Result Date:  06/21/2020 CLINICAL DATA:  Abdominal distention. EXAM: ABDOMEN - 1 VIEW COMPARISON:  06/19/2020.  CT 06/18/2020. FINDINGS: Persistent colonic distention. Colonic wall thickening consistent with edema again noted. Similar findings noted on prior studies and are consistent with a colonic process such as colitis. Several air-filled loops of slightly prominent small bowel noted. No free air identified. Degenerative change lumbar spine. IMPRESSION: Persistent colonic distention. Colonic wall thickening again noted. Similar findings noted on prior studies and are again consistent with a colonic process such as colitis. Electronically Signed   By: Marcello Moores  Register   On: 06/21/2020 06:29    Assessment / Plan:   Assessment: 1.  C. difficile colitis: Longstanding left-sided UC but was in clinical and endoscopic remission on sulfasalazine until few weeks ago, recently diagnosed with C. difficile, no outpatient improvement after a week of oral vancomycin, now on oral Vancomycin and Flagyl, patient improving, 3 bowel movements so far this morning 2.  UC 3.  Normocytic anemia 4.  Probable ileus  Plan: 1.  Reviewed x-ray from yesterday with the patient and his wife, there have been no changes which is reassuring. 2.  Continue current therapy including vancomycin 4 times daily and p.o. Flagyl as well as twice daily Florastor and Azulfidine 1000 mg 3 times daily 3.  Please await any further recommendations from Dr.  Mansouraty later today  Thank you for kind consultation, we will continue to follow.   LOS: 3 days   Levin Erp  06/21/2020, 11:32 AM

## 2020-06-21 NOTE — Progress Notes (Signed)
[place] for IV heparin Indication: new acute DVT  No Known Allergies  Patient Measurements: Height: 5' 10"  (177.8 cm) Weight: 96.1 kg (211 lb 13.8 oz) IBW/kg (Calculated) : 73 Heparin Dosing Weight: 92.7 kg (used [place] dosing to start)  Vital Signs: Temp: 98.5 F (36.9 C) (12/02 2033) Temp Source: Oral (12/02 2033) BP: 136/57 (12/02 2033) Pulse Rate: 103 (12/02 2033)  Labs: Recent Labs    06/19/20 0724 06/19/20 0724 06/20/20 0650 06/21/20 0620 06/21/20 2250  HGB 7.9*   < > 7.6* 7.6*  --   HCT 25.9*  --  24.1* 25.0*  --   PLT 432*  --  417* 432*  --   HEPARINUNFRC  --   --   --   --  <0.10*  CREATININE 0.97  --  0.94 0.94  --    < > = values in this interval not displayed.    Estimated Creatinine Clearance: 86.2 mL/min (by C-G formula based on SCr of 0.94 mg/dL).   Medical History: Past Medical History:  Diagnosis Date  . Anemia   . Arthritis   . Elevated cholesterol   . Hypertension   . UC (ulcerative colitis) ([place])     Medications:  Facility-Administered Medications Prior to Admission  Medication Dose Route Frequency Provider Last Rate Last Admin  . 0.9 %  sodium chloride infusion   Intravenous Once Cirigliano, Vito V, DO       Medications Prior to Admission  Medication Sig Dispense Refill Last Dose  . acetaminophen (TYLENOL) 500 MG tablet Take 500 mg by mouth every 6 (six) hours as needed for fever.   06/17/2020 at Unknown time  . amLODipine-valsartan (EXFORGE) 10-320 MG tablet TAKE 1 TABLET BY MOUTH  DAILY 120 tablet 2 06/15/2020  . diphenhydrAMINE HCl, Sleep, (ZZZQUIL PO) Take 10 mLs by mouth daily as needed (sleep).   06/04/2020  . Ferrous Sulfate (IRON) 325 (65 Fe) MG TABS Take 1 tablet by mouth daily.   Past Week at Unknown time  . folic acid (FOLVITE) 1 MG tablet TAKE 1 TABLET BY MOUTH  DAILY (Patient taking differently: Take 1 mg by mouth daily. ) 120 tablet 2 Past Week at Unknown time  . NON FORMULARY  Take 1 tablet by mouth daily. Vital reds-energy supplement   Past Week at Unknown time  . Saccharomyces boulardii (FLORASTOR PO) Take 1 capsule by mouth in the morning and at bedtime.   06/18/2020 at Unknown time  . sulfaSALAzine (AZULFIDINE) 500 MG tablet TAKE 2 TABLETS BY MOUTH 3  TIMES DAILY (Patient taking differently: Take 1,000 mg by mouth 3 (three) times daily. ) 720 tablet 2 Past Week at Unknown time  . Turmeric 500 MG TABS Take 1,000 mg by mouth daily.   Past Week at Unknown time  . vancomycin (VANCOCIN HCL) 125 MG capsule Take 1 capsule (125 mg total) by mouth 4 (four) times daily for 14 days. 56 capsule 0 06/18/2020 at Unknown time  . VITAMIN D, CHOLECALCIFEROL, PO Take 5,000 Units by mouth daily.   Past Week at Unknown time  . chlorthalidone (HYGROTON) 25 MG tablet TAKE 1 TABLET BY MOUTH  DAILY (Patient not taking: Reported on 06/18/2020) 120 tablet 2 Not Taking at Unknown time  . ondansetron (ZOFRAN ODT) 4 MG disintegrating tablet Take 1 tablet (4 mg total) by mouth every 6 (six) hours as needed for nausea or vomiting. tke 30 minutes before antibiotics (Patient not taking: Reported on 06/18/2020) 20 tablet 0 Not  Taking at Unknown time   Scheduled:  . [START ON 06/22/2020] calcium carbonate  1 tablet Oral BID WC  . feeding supplement (KATE FARMS STANDARD 1.4)  325 mL Oral BID BM  . metroNIDAZOLE  500 mg Oral Q8H  . multivitamin with minerals  1 tablet Oral Daily  . saccharomyces boulardii  250 mg Oral BID  . sulfaSALAzine  1,000 mg Oral TID PC  . vancomycin  125 mg Oral QID   Followed by  . [START ON 07/03/2020] vancomycin  125 mg Oral BID   Followed by  . [START ON 07/11/2020] vancomycin  125 mg Oral Daily   Followed by  . [START ON 07/18/2020] vancomycin  125 mg Oral QODAY   Followed by  . [START ON 07/26/2020] vancomycin  125 mg Oral Q3 days   Infusions:  . heparin 1,400 Units/hr (06/21/20 1703)    Assessment: 53 yoM with PMH UC, HTN, currently being treated for Cdiff,  admitted 11/29 for persistent diarrhea, including bloody diarrhea. On 12/2 LE swelling noted and venous duplex shows acute RLE DVT. Pharmacy to dose IV heparin.   Baseline INR, aPTT: not done  Prior anticoagulation: none  Significant events:  Today, 06/21/2020:  HL <0.1, subtherapeutic  CBC: Hgb remains low but stable after multiple bouts of bloody diarrhea. Last known incidence of blood was 12/1 PM per RN  No bleeding or infusion issues per RN  Scr stable WNL  Goal of Therapy: Heparin level 0.3-0.7 units/ml Monitor platelets by anticoagulation protocol: Yes  Plan:  Heparin 3000 units IV bolus x 1  Increase Heparin 1600 units/hr IV infusion  Check heparin level 6 hrs   Daily CBC, daily heparin level once stable  Monitor for signs of bleeding or thrombosis  Dolly Rias RPh 06/22/2020, 12:06 AM

## 2020-06-21 NOTE — Care Management Important Message (Signed)
Important Message   Patient Details IM Letter given to the Patient. Name: Justin Rosales MRN: [id number] Date of Birth: [birth year]   Medicare Important Message Given:  Yes     Kerin Salen 06/21/2020, 9:35 AM

## 2020-06-21 NOTE — Progress Notes (Signed)
Bilateral lower extremity venous duplex has been completed. Preliminary results can be found in CV Proc through chart review.  Results were given to the patient's nurse, Joelene Millin.  06/21/20 2:19 PM Justin Rosales RVT

## 2020-06-21 NOTE — Progress Notes (Signed)
PROGRESS NOTE    Justin Rosales  WUJ:[id number] DOB: January 14, 1951 DOA: 06/18/2020 PCP: Shelda Pal, DO  Brief Narrative:  HPI Per Dr. Jennette Kettle on 06/18/20 Justin Rosales is a 69 y.o. male with medical history significant of UC, HTN.  Pt with diarrhea for past couple of weeks, diagnosed with C. Diff based on GI pathogen pnl on 11/16.  Treatment with PO vanc started 11/19.  Diarrhea initially bloody, now brown.  Diarrhea has persisted since starting treatment.  Continues to have intermittent fevers.  BPs running on low side despite not taking BP meds.  Back in to ED today.   ED Course: Tm 100.5, BP 95-621 systolic.  Sodium 128.  WBC 12.6.  COVID neg.  CT abd pelvis shows colitis.  Pt started on high dose PO vanc and IV flagyl, and sent for admission.  **Interim History He thinks that his diarrhea is slowly improving.  His bowel movements are slowing down and he had 3 over last 12 hours or so which have been nonbloody.  No nausea or vomiting.  Abdomen still distended but he continues to tolerate p.o. Flagyl and p.o. vancomycin for now.  Gastroenterology following appreciate further evaluation recommendations.  For his obesity and recommending [employer] 1.4 p.o. twice daily as well as a daily [place] yogurt snack.  Currently patient is improving on his regimen and GI recommending continuing current course.  Assessment & Plan:   Principal Problem:   C. difficile colitis Active Problems:   Ulcerative colitis with complication ([place])   Essential hypertension   Anemia   Sepsis ([place])  Sepsis secondary to C.Diff colitis, acute recurrent, POA, sepsis physiology is improved -Patient met sepsis criteria on admission as he was febrile with a temperature of 101.4, tachycardic at a rate of 106, had a leukocytosis of 12.6 and a source of infection with C. Difficile -Sepsis Physiology is improving but he was febrile the day before yesterday but not overnight -Continue p.o. Vanco  per protocol on MAR and p.o. Flagyl, IV Flagyl shortage unfortunately unavailable and he is able to take p.o. so we will continue p.o. Flagyl -CT Abd/Pelvis showed "Left colon is diffusely distended and featureless with mild circumferential wall thickening starting the distal transverse colon through the splenic flexure down to the rectum mild associated pericolonic edema/inflammation. Imaging features are compatible with infectious/inflammatory colitis. Bilateral renal cysts. Aortic Atherosclerosis" -KUB yesterday showed "Progressive gaseous distension of the colon suggestive of worsening colonic ileus. Signs of bowel wall edema involving the left colon compatible with colitis." -KUB today (06/21/20) showed "Persistent colonic distention. Colonic wall thickening again noted. Similar findings noted on prior studies and are again consistent with a colonic process such as colitis." -Patient's CRP is 12.3 and ESR was 115 on admission -GI following, appreciate insight recommendations; GI recommending that if he is not improving they can switch him to Dificid or consider a flexible sigmoidoscopy for now they recommend to continue his current course -His diarrhea has slowed down but not completely resolved.  Had 6 total bowel movements since yesterday to today and 3 early this morning -Patient was started on Saccharomyces Boulardii 250 mg twice daily probiotic and will continue for now -IV fluid hydration has now been reduced to 75 MLS per hour given his low extremity swelling in the setting of likely poor albumin and now finally stopped today.  Will not need any further IV fluids currently as he is tolerating p.o. adequately  Normocytic Anemia, Acute Blood Loss Anemia on Chronic Anemia of  Chronic Dsease -Baseline appears to be around 11; hemoglobin dropped to 7.9 a few days ago and has been relatively stable last few days and is 7.6/25.0 now -Bleeding appears to have slowed down as diarrhea improves -Anemia  Panel done and showed an iron level of 14, U IBC of 108, TIBC 122, saturation ratios of 11%, ferritin level of 370 -GI recommending getting a dose of IV iron but given that he was septic with infection may hold for now.  If he drops below 7 will transfuse PRBC -May resume oral Iron daily but will defer to GI  -Continue to monitor for signs and symptoms of bleeding; currently no overt bleeding  -Repeat CBC in the a.m.  Colonic Ileus -KUB 06/20/20 showed "Progressive gaseous distension of the colon suggestive of worsening colonic ileus. Signs of bowel wall edema involving the left colon compatible with colitis." -C/w OOB and Ambulation -K+ was 4.2; Check Mag Level and was 1.9 -Ca2+ was 7.5 and replete with IV Calcium Gluconate 1 gram yesterday; Corrected fro Albumin Ca2+ was 9.2 -Continue to Monitor and Trend Electrolytes -KUB this AM as above  Thrombocytosis  -Likely reactive in the setting of above -Patient's platelet went from 432 -> 417 -> 432 -Continue to monitor and trend and repeat CBC in the a.m.  Hyponatremia -Mild on admission at 128 and now improved slowly -Patient sodium went from 128 ->131 -> 130 -> 132 -Continue monitor and trend repeat CMP in a.m.  HTN, Essential -Borderline hypotension, currently holding home BP meds -Continue to Monitor BP per Protocol  -Last BP was 122/60  UC  -He has longstanding left-sided ulcerative colitis and was in clinical endoscopic remission per GI until a few weeks ago -Defer any need for treatment to GI and now is on Sulfasalazine 1000 mg po TID  Obesity -Complicates overall prognosis and care -Estimated body mass index is 30.4 kg/m as calculated from the following:   Height as of this encounter: 5' 10"  (1.778 m).   Weight as of this encounter: 96.1 kg. -Weight Loss and Dietary Counseling given -Nutritionist consulted and recommending Boost Breeze BID, Prosource Plus BID, MVI+Minerals 1 tab Daily   Lower Extremity Swelling -Likely  in setting of poor nutritional status as his albumin is 1.9  -IVF now stopped  -Will obtain a LE Venous Duplex Bilaterally to R/o DVT  DVT prophylaxis: SCDs given Bleeding  Code Status: FULL CODE  Family Communication: No family present at bedside  Disposition Plan: Pending further clinical improvement, stablization of Hb and tolerance of po Diet as well as reduced BM prior to safe discharge disposition  Status is: Inpatient  Remains inpatient appropriate because:Unsafe d/c plan, IV treatments appropriate due to intensity of illness or inability to take PO and Inpatient level of care appropriate due to severity of illness   Dispo: The patient is from: Home              Anticipated d/c is to: Home              Anticipated d/c date is: 2 days              Patient currently is not medically stable to d/c.  Consultants:   Gastroenterology  Procedures: CT Abdomen/Pelvis; KUB  Antimicrobials:  Anti-infectives (From admission, onward)   Start     Dose/Rate Route Frequency Ordered Stop   07/26/20 1000  vancomycin (VANCOCIN) 50 mg/mL oral solution 125 mg       "Followed by" Linked Group Details  125 mg Oral Every 3 DAYS 06/19/20 1054 08/04/20 0959   07/18/20 1000  vancomycin (VANCOCIN) 50 mg/mL oral solution 125 mg       "Followed by" Linked Group Details   125 mg Oral Every other day 06/19/20 1054 07/26/20 0959   07/11/20 1000  vancomycin (VANCOCIN) 50 mg/mL oral solution 125 mg       "Followed by" Linked Group Details   125 mg Oral Daily 06/19/20 1054 07/18/20 0959   07/03/20 2200  vancomycin (VANCOCIN) 50 mg/mL oral solution 125 mg       "Followed by" Linked Group Details   125 mg Oral 2 times daily 06/19/20 1054 07/10/20 2159   06/19/20 1400  vancomycin (VANCOCIN) 50 mg/mL oral solution 125 mg       "Followed by" Linked Group Details   125 mg Oral 4 times daily 06/19/20 1054 07/03/20 1359   06/19/20 1400  metroNIDAZOLE (FLAGYL) tablet 500 mg        500 mg Oral Every 8 hours  06/19/20 1135     06/18/20 1800  vancomycin (VANCOCIN) 50 mg/mL oral solution 500 mg  Status:  Discontinued        500 mg Oral Every 6 hours 06/18/20 1543 06/19/20 1053   06/18/20 1545  metroNIDAZOLE (FLAGYL) IVPB 500 mg  Status:  Discontinued        500 mg 100 mL/hr over 60 Minutes Intravenous Every 8 hours 06/18/20 1543 06/19/20 1053        Subjective: Seen and examined at bedside and he is doing okay.  He denies any nausea vomiting and states that he is only had 3 bowel movements in the last 12 hours and they have not been nonbloody and states that he has had 5 total in the last 24 hours so far.  Thinks his diarrhea is slowing down.  No chest pain, lightheadedness or dizziness.  No other concerns or complaints at this time.  Objective: Vitals:   06/20/20 0620 06/20/20 1532 06/20/20 1954 06/21/20 0535  BP: (!) 123/58 121/62 126/63 122/60  Pulse: 96 (!) 101 (!) 101 93  Resp: 16 17 16 16   Temp: 98.1 F (36.7 C) 98.3 F (36.8 C) 98.3 F (36.8 C) 98.5 F (36.9 C)  TempSrc: Oral Oral Oral Oral  SpO2: 97% 98% 100% 99%  Weight:      Height:        Intake/Output Summary (Last 24 hours) at 06/21/2020 1259 Last data filed at 06/21/2020 1049 Gross per 24 hour  Intake 794.46 ml  Output --  Net 794.46 ml   Filed Weights   06/18/20 1146 06/18/20 1757  Weight: 99.8 kg 96.1 kg   Examination: Physical Exam:  Constitutional: WN/WD obese Caucasian male in NAD and appears calm and comfortable Eyes: Lids and conjunctivae normal, sclerae anicteric  ENMT: External Ears, Nose appear normal. Grossly normal hearing.  Neck: Appears normal, supple, no cervical masses, normal ROM, no appreciable thyromegaly; no JVD Respiratory: Diminished to auscultation bilaterally, no wheezing, rales, rhonchi or crackles. Normal respiratory effort and patient is not tachypenic. No accessory muscle use.  Cardiovascular: RRR, no murmurs / rubs / gallops. S1 and S2 auscultated. 1+ LE Edema Abdomen: Soft, mildly  tender, Distended and tympanic to percussion. Bowel sounds positive.  GU: Deferred. Musculoskeletal: No clubbing / cyanosis of digits/nails. No joint deformity upper and lower extremities.  Skin: No rashes, lesions, ulcers on a limited skin evaluation. No induration; Warm and dry.  Neurologic: CN 2-12 grossly intact with no  focal deficits. Romberg sign and cerebellar reflexes not assessed.  Psychiatric: Normal judgment and insight. Alert and oriented x 3. Normal mood and appropriate affect.   Data Reviewed: I have personally reviewed following labs and imaging studies  CBC: Recent Labs  Lab 06/18/20 1158 06/19/20 0724 06/20/20 0650 06/21/20 0620  WBC 12.6* 10.5 9.0 10.4  NEUTROABS  --   --   --  6.5  HGB 8.2* 7.9* 7.6* 7.6*  HCT 26.1* 25.9* 24.1* 25.0*  MCV 85.0 87.8 86.4 87.1  PLT 513* 432* 417* 128*   Basic Metabolic Panel: Recent Labs  Lab 06/18/20 1158 06/19/20 0724 06/20/20 0650 06/21/20 0620  NA 128* 131* 130* 132*  K 4.5 4.6 4.5 4.2  CL 92* 99 98 101  CO2 26 22 24 23   GLUCOSE 126* 107* 149* 126*  BUN 22 14 12 9   CREATININE 1.13 0.97 0.94 0.94  CALCIUM 8.0* 7.7* 7.4* 7.5*  MG  --   --   --  1.9  PHOS  --   --   --  3.1   GFR: Estimated Creatinine Clearance: 86.2 mL/min (by C-G formula based on SCr of 0.94 mg/dL). Liver Function Tests: Recent Labs  Lab 06/18/20 1158 06/20/20 0650 06/21/20 0620  AST 51* 20 15  ALT 54* 34 25  ALKPHOS 57 45 43  BILITOT 0.2* 0.5 0.4  PROT 5.9* 4.8* 4.8*  ALBUMIN 2.0* 1.9* 1.9*   Recent Labs  Lab 06/18/20 1158  LIPASE 23   No results for input(s): AMMONIA in the last 168 hours. Coagulation Profile: No results for input(s): INR, PROTIME in the last 168 hours. Cardiac Enzymes: No results for input(s): CKTOTAL, CKMB, CKMBINDEX, TROPONINI in the last 168 hours. BNP (last 3 results) No results for input(s): PROBNP in the last 8760 hours. HbA1C: No results for input(s): HGBA1C in the last 72 hours. CBG: No results for  input(s): GLUCAP in the last 168 hours. Lipid Profile: No results for input(s): CHOL, HDL, LDLCALC, TRIG, CHOLHDL, LDLDIRECT in the last 72 hours. Thyroid Function Tests: No results for input(s): TSH, T4TOTAL, FREET4, T3FREE, THYROIDAB in the last 72 hours. Anemia Panel: Recent Labs    06/19/20 1235  FERRITIN 370*  TIBC 122*  IRON 14*   Sepsis Labs: Recent Labs  Lab 06/18/20 1338  LATICACIDVEN 0.9    Recent Results (from the past 240 hour(s))  Blood culture (routine x 2)     Status: None (Preliminary result)   Collection Time: 06/18/20  1:30 PM   Specimen: BLOOD  Result Value Ref Range Status   Specimen Description   Final    BLOOD RIGHT ANTECUBITAL Performed at [facility], [place] [address], [place], [place] [address]    Special Requests   Final    BOTTLES DRAWN AEROBIC AND ANAEROBIC Blood Culture adequate volume Performed at [facility], [address] [place], [place] [address]    Culture   Final    NO GROWTH 3 DAYS Performed at [facility], [place] [address], [place], [place] [address]    Report Status PENDING  Incomplete  Resp Panel by RT-PCR (Flu A&B, Covid) Nasopharyngeal Swab     Status: None   Collection Time: 06/18/20  1:38 PM   Specimen: Nasopharyngeal Swab; Nasopharyngeal(NP) swabs in vial transport medium  Result Value Ref Range Status   SARS Coronavirus 2 by RT PCR NEGATIVE NEGATIVE Final    Comment: (NOTE) SARS-CoV-2 target nucleic acids are NOT DETECTED.  The SARS-CoV-2  RNA is generally detectable in upper respiratory specimens during the acute phase of infection. The lowest concentration of SARS-CoV-2 viral copies this assay can detect is 138 copies/mL. A negative result does not preclude SARS-Cov-2 infection and should not be used as the sole basis for treatment or other patient management decisions. A negative result may occur with  improper specimen collection/handling, submission of specimen other than nasopharyngeal  swab, presence of viral mutation(s) within the areas targeted by this assay, and inadequate number of viral copies(<138 copies/mL). A negative result must be combined with clinical observations, patient history, and epidemiological information. The expected result is Negative.  Fact Sheet for Patients:  EntrepreneurPulse.com.au  Fact Sheet for Healthcare Providers:  IncredibleEmployment.be  This test is no t yet approved or cleared by the [place] FDA and  has been authorized for detection and/or diagnosis of SARS-CoV-2 by FDA under an Emergency Use Authorization (EUA). This EUA will remain  in effect (meaning this test can be used) for the duration of the COVID-19 declaration under Section 564(b)(1) of the Act, 21 U.S.C.section 360bbb-3(b)(1), unless the authorization is terminated  or revoked sooner.       Influenza A by PCR NEGATIVE NEGATIVE Final   Influenza B by PCR NEGATIVE NEGATIVE Final    Comment: (NOTE) The Xpert Xpress SARS-CoV-2/FLU/RSV plus assay is intended as an aid in the diagnosis of influenza from Nasopharyngeal swab specimens and should not be used as a sole basis for treatment. Nasal washings and aspirates are unacceptable for Xpert Xpress SARS-CoV-2/FLU/RSV testing.  Fact Sheet for Patients: EntrepreneurPulse.com.au  Fact Sheet for Healthcare Providers: IncredibleEmployment.be  This test is not yet approved or cleared by the [place] FDA and has been authorized for detection and/or diagnosis of SARS-CoV-2 by FDA under an Emergency Use Authorization (EUA). This EUA will remain in effect (meaning this test can be used) for the duration of the COVID-19 declaration under Section 564(b)(1) of the Act, 21 U.S.C. section 360bbb-3(b)(1), unless the authorization is terminated or revoked.  Performed at [facility], [address] [place], [place] [address]   Blood  culture (routine x 2)     Status: None (Preliminary result)   Collection Time: 06/18/20  1:40 PM   Specimen: BLOOD RIGHT FOREARM  Result Value Ref Range Status   Specimen Description   Final    BLOOD RIGHT FOREARM Performed at [facility], [address] [place], [place] [address]    Special Requests   Final    BOTTLES DRAWN AEROBIC AND ANAEROBIC Blood Culture adequate volume Performed at [facility], [address] [place], [place] [address]    Culture   Final    NO GROWTH 3 DAYS Performed at [facility], [place] [address] [place], [place] [address]    Report Status PENDING  Incomplete     RN Pressure Injury Documentation:     Estimated body mass index is 30.4 kg/m as calculated from the following:   Height as of this encounter: 5' 10"  (1.778 m).   Weight as of this encounter: 96.1 kg.  Malnutrition Type:  Nutrition Problem: Increased nutrient needs Etiology: acute illness   Malnutrition Characteristics:  Signs/Symptoms: estimated needs   Nutrition Interventions:  Interventions: MVI, Boost Breeze, Prostat     Radiology Studies: DG Abd 1 View  Result Date: 06/21/2020 CLINICAL DATA:  Abdominal distention. EXAM: ABDOMEN - 1 VIEW COMPARISON:  06/19/2020.  CT 06/18/2020. FINDINGS: Persistent colonic distention. Colonic  wall thickening consistent with edema again noted. Similar findings noted on prior studies and are consistent with a colonic process such as colitis. Several air-filled loops of slightly prominent small bowel noted. No free air identified. Degenerative change lumbar spine. IMPRESSION: Persistent colonic distention. Colonic wall thickening again noted. Similar findings noted on prior studies and are again consistent with a colonic process such as colitis. Electronically Signed   By: Marcello Moores  Register   On: 06/21/2020 06:29   Scheduled Meds: . feeding supplement (KATE FARMS STANDARD 1.4)  325 mL Oral BID BM  . metroNIDAZOLE   500 mg Oral Q8H  . multivitamin with minerals  1 tablet Oral Daily  . saccharomyces boulardii  250 mg Oral BID  . sulfaSALAzine  1,000 mg Oral TID PC  . vancomycin  125 mg Oral QID   Followed by  . [START ON 07/03/2020] vancomycin  125 mg Oral BID   Followed by  . [START ON 07/11/2020] vancomycin  125 mg Oral Daily   Followed by  . [START ON 07/18/2020] vancomycin  125 mg Oral QODAY   Followed by  . [START ON 07/26/2020] vancomycin  125 mg Oral Q3 days   Continuous Infusions:   LOS: 3 days   Kerney Elbe, DO [facility] PAGER is on [place]  If 7PM-7AM, please contact night-coverage [email or web address]

## 2020-06-21 NOTE — Telephone Encounter (Signed)
Hi Dr. Bryan Lemma, patient called to cancel his appointment; he is currently in the hospital.  He will call back to reschedule.

## 2020-06-21 NOTE — Progress Notes (Signed)
[place] for IV heparin Indication: new acute DVT  No Known Allergies  Patient Measurements: Height: 5' 10"  (177.8 cm) Weight: 96.1 kg (211 lb 13.8 oz) IBW/kg (Calculated) : 73 Heparin Dosing Weight: 92.7 kg (used [place] dosing to start)  Vital Signs: Temp: 98.5 F (36.9 C) (12/02 0535) Temp Source: Oral (12/02 0535) BP: 122/60 (12/02 0535) Pulse Rate: 93 (12/02 0535)  Labs: Recent Labs    06/19/20 0724 06/19/20 0724 06/20/20 0650 06/21/20 0620  HGB 7.9*   < > 7.6* 7.6*  HCT 25.9*  --  24.1* 25.0*  PLT 432*  --  417* 432*  CREATININE 0.97  --  0.94 0.94   < > = values in this interval not displayed.    Estimated Creatinine Clearance: 86.2 mL/min (by C-G formula based on SCr of 0.94 mg/dL).   Medical History: Past Medical History:  Diagnosis Date  . Anemia   . Arthritis   . Elevated cholesterol   . Hypertension   . UC (ulcerative colitis) ([place])     Medications:  Facility-Administered Medications Prior to Admission  Medication Dose Route Frequency Provider Last Rate Last Admin  . 0.9 %  sodium chloride infusion   Intravenous Once Cirigliano, Vito V, DO       Medications Prior to Admission  Medication Sig Dispense Refill Last Dose  . acetaminophen (TYLENOL) 500 MG tablet Take 500 mg by mouth every 6 (six) hours as needed for fever.   06/17/2020 at Unknown time  . amLODipine-valsartan (EXFORGE) 10-320 MG tablet TAKE 1 TABLET BY MOUTH  DAILY 120 tablet 2 06/15/2020  . diphenhydrAMINE HCl, Sleep, (ZZZQUIL PO) Take 10 mLs by mouth daily as needed (sleep).   06/04/2020  . Ferrous Sulfate (IRON) 325 (65 Fe) MG TABS Take 1 tablet by mouth daily.   Past Week at Unknown time  . folic acid (FOLVITE) 1 MG tablet TAKE 1 TABLET BY MOUTH  DAILY (Patient taking differently: Take 1 mg by mouth daily. ) 120 tablet 2 Past Week at Unknown time  . NON FORMULARY Take 1 tablet by mouth daily. Vital reds-energy supplement   Past Week at  Unknown time  . Saccharomyces boulardii (FLORASTOR PO) Take 1 capsule by mouth in the morning and at bedtime.   06/18/2020 at Unknown time  . sulfaSALAzine (AZULFIDINE) 500 MG tablet TAKE 2 TABLETS BY MOUTH 3  TIMES DAILY (Patient taking differently: Take 1,000 mg by mouth 3 (three) times daily. ) 720 tablet 2 Past Week at Unknown time  . Turmeric 500 MG TABS Take 1,000 mg by mouth daily.   Past Week at Unknown time  . vancomycin (VANCOCIN HCL) 125 MG capsule Take 1 capsule (125 mg total) by mouth 4 (four) times daily for 14 days. 56 capsule 0 06/18/2020 at Unknown time  . VITAMIN D, CHOLECALCIFEROL, PO Take 5,000 Units by mouth daily.   Past Week at Unknown time  . chlorthalidone (HYGROTON) 25 MG tablet TAKE 1 TABLET BY MOUTH  DAILY (Patient not taking: Reported on 06/18/2020) 120 tablet 2 Not Taking at Unknown time  . ondansetron (ZOFRAN ODT) 4 MG disintegrating tablet Take 1 tablet (4 mg total) by mouth every 6 (six) hours as needed for nausea or vomiting. tke 30 minutes before antibiotics (Patient not taking: Reported on 06/18/2020) 20 tablet 0 Not Taking at Unknown time   Scheduled:  . feeding supplement (KATE FARMS STANDARD 1.4)  325 mL Oral BID BM  . heparin  3,000 Units Intravenous Once  .  metroNIDAZOLE  500 mg Oral Q8H  . multivitamin with minerals  1 tablet Oral Daily  . saccharomyces boulardii  250 mg Oral BID  . sulfaSALAzine  1,000 mg Oral TID PC  . vancomycin  125 mg Oral QID   Followed by  . [START ON 07/03/2020] vancomycin  125 mg Oral BID   Followed by  . [START ON 07/11/2020] vancomycin  125 mg Oral Daily   Followed by  . [START ON 07/18/2020] vancomycin  125 mg Oral QODAY   Followed by  . [START ON 07/26/2020] vancomycin  125 mg Oral Q3 days   Infusions:  . heparin    . iron sucrose      Assessment: 25 yoM with PMH UC, HTN, currently being treated for Cdiff, admitted 11/29 for persistent diarrhea, including bloody diarrhea. On 12/2 LE swelling noted and venous duplex  shows acute RLE DVT. Pharmacy to dose IV heparin.   Baseline INR, aPTT: not done  Prior anticoagulation: none  Significant events:  Today, 06/21/2020:  CBC: Hgb remains low but stable after multiple bouts of bloody diarrhea. Last known incidence of blood was 12/1 PM per RN  No bleeding or infusion issues per nursing  Scr stable WNL  Goal of Therapy: Heparin level 0.3-0.7 units/ml Monitor platelets by anticoagulation protocol: Yes  Plan:  Heparin 3000 units IV bolus x 1  Heparin 1400 units/hr IV infusion  Check heparin level 8 hrs after start  Daily CBC, daily heparin level once stable  Monitor for signs of bleeding or thrombosis  Reuel Boom, PharmD, BCPS [phone] 06/21/2020, 2:28 PM

## 2020-06-22 DIAGNOSIS — D509 Iron deficiency anemia, unspecified: Secondary | ICD-10-CM

## 2020-06-22 DIAGNOSIS — A419 Sepsis, unspecified organism: Secondary | ICD-10-CM | POA: Diagnosis not present

## 2020-06-22 DIAGNOSIS — K51519 Left sided colitis with unspecified complications: Secondary | ICD-10-CM | POA: Diagnosis not present

## 2020-06-22 DIAGNOSIS — I10 Essential (primary) hypertension: Secondary | ICD-10-CM | POA: Diagnosis not present

## 2020-06-22 DIAGNOSIS — D649 Anemia, unspecified: Secondary | ICD-10-CM | POA: Diagnosis not present

## 2020-06-22 DIAGNOSIS — A0472 Enterocolitis due to Clostridium difficile, not specified as recurrent: Secondary | ICD-10-CM | POA: Diagnosis not present

## 2020-06-22 DIAGNOSIS — D72829 Elevated white blood cell count, unspecified: Secondary | ICD-10-CM

## 2020-06-22 LAB — COMPREHENSIVE METABOLIC PANEL
ALT: 23 U/L (ref 0–44)
AST: 18 U/L (ref 15–41)
Albumin: 1.8 g/dL — ABNORMAL LOW (ref 3.5–5.0)
Alkaline Phosphatase: 39 U/L (ref 38–126)
Anion gap: 7 (ref 5–15)
BUN: 10 mg/dL (ref 8–23)
CO2: 22 mmol/L (ref 22–32)
Calcium: 7.5 mg/dL — ABNORMAL LOW (ref 8.9–10.3)
Chloride: 103 mmol/L (ref 98–111)
Creatinine, Ser: 0.91 mg/dL (ref 0.61–1.24)
GFR, Estimated: >60 mL/min (ref >60)
Glucose, Bld: 107 mg/dL — ABNORMAL HIGH (ref 70–99)
Potassium: 3.8 mmol/L (ref 3.5–5.1)
Sodium: 132 mmol/L — ABNORMAL LOW (ref 135–145)
Total Bilirubin: 0.4 mg/dL (ref 0.3–1.2)
Total Protein: 4.4 g/dL — ABNORMAL LOW (ref 6.5–8.1)

## 2020-06-22 LAB — CBC WITH DIFFERENTIAL/PLATELET
Abs Immature Granulocytes: 0.4 10*3/uL — ABNORMAL HIGH (ref 0.00–0.07)
Abs Immature Granulocytes: 0.59 10*3/uL — ABNORMAL HIGH (ref 0.00–0.07)
Basophils Absolute: 0.1 10*3/uL (ref 0.0–0.1)
Basophils Absolute: 0.1 10*3/uL (ref 0.0–0.1)
Basophils Relative: 1 %
Basophils Relative: 0 %
Eosinophils Absolute: 0.2 10*3/uL (ref 0.0–0.5)
Eosinophils Absolute: 0.2 10*3/uL (ref 0.0–0.5)
Eosinophils Relative: 2 %
Eosinophils Relative: 1 %
HCT: 23.6 % — ABNORMAL LOW (ref 39.0–52.0)
HCT: 24.1 % — ABNORMAL LOW (ref 39.0–52.0)
Hemoglobin: 7.1 g/dL — ABNORMAL LOW (ref 13.0–17.0)
Hemoglobin: 7.3 g/dL — ABNORMAL LOW (ref 13.0–17.0)
Immature Granulocytes: 3 %
Immature Granulocytes: 4 %
Lymphocytes Relative: 15 %
Lymphocytes Relative: 15 %
Lymphs Abs: 1.8 10*3/uL (ref 0.7–4.0)
Lymphs Abs: 2.5 10*3/uL (ref 0.7–4.0)
MCH: 25.9 pg — ABNORMAL LOW (ref 26.0–34.0)
MCH: 26.3 pg (ref 26.0–34.0)
MCHC: 30.1 g/dL (ref 30.0–36.0)
MCHC: 30.3 g/dL (ref 30.0–36.0)
MCV: 86.1 fL (ref 80.0–100.0)
MCV: 86.7 fL (ref 80.0–100.0)
Monocytes Absolute: 1.3 10*3/uL — ABNORMAL HIGH (ref 0.1–1.0)
Monocytes Absolute: 1.6 10*3/uL — ABNORMAL HIGH (ref 0.1–1.0)
Monocytes Relative: 11 %
Monocytes Relative: 9 %
Neutro Abs: 8.4 10*3/uL — ABNORMAL HIGH (ref 1.7–7.7)
Neutro Abs: 11.7 10*3/uL — ABNORMAL HIGH (ref 1.7–7.7)
Neutrophils Relative %: 68 %
Neutrophils Relative %: 71 %
Platelets: 440 10*3/uL — ABNORMAL HIGH (ref 150–400)
Platelets: 498 10*3/uL — ABNORMAL HIGH (ref 150–400)
RBC: 2.74 MIL/uL — ABNORMAL LOW (ref 4.22–5.81)
RBC: 2.78 MIL/uL — ABNORMAL LOW (ref 4.22–5.81)
RDW: 15.9 % — ABNORMAL HIGH (ref 11.5–15.5)
RDW: 15.9 % — ABNORMAL HIGH (ref 11.5–15.5)
WBC: 12.2 10*3/uL — ABNORMAL HIGH (ref 4.0–10.5)
WBC: 16.6 10*3/uL — ABNORMAL HIGH (ref 4.0–10.5)
nRBC: 0.7 % — ABNORMAL HIGH (ref 0.0–0.2)
nRBC: 0.6 % — ABNORMAL HIGH (ref 0.0–0.2)

## 2020-06-22 LAB — HEPARIN LEVEL (UNFRACTIONATED)
Heparin Unfractionated: 0.22 IU/mL — ABNORMAL LOW (ref 0.30–0.70)
Heparin Unfractionated: 0.15 IU/mL — ABNORMAL LOW (ref 0.30–0.70)

## 2020-06-22 LAB — MAGNESIUM: Magnesium: 1.7 mg/dL (ref 1.7–2.4)

## 2020-06-22 LAB — PHOSPHORUS: Phosphorus: 3 mg/dL (ref 2.5–4.6)

## 2020-06-22 MED ORDER — HEPARIN BOLUS VIA INFUSION
3000.0000 [IU] | Freq: Once | INTRAVENOUS | Status: AC
  Administered 2020-06-22: 3000 [IU] via INTRAVENOUS
  Filled 2020-06-22: qty 3000

## 2020-06-22 MED ORDER — NUTRISOURCE FIBER PO PACK
1.0000 | PACK | Freq: Two times a day (BID) | ORAL | Status: DC
  Administered 2020-06-22 – 2020-06-25 (×6): 1 via ORAL
  Filled 2020-06-22 (×7): qty 1

## 2020-06-22 MED ORDER — NUTRISOURCE FIBER PO PACK
1.0000 | PACK | Freq: Two times a day (BID) | ORAL | Status: DC
  Filled 2020-06-22: qty 1

## 2020-06-22 MED ORDER — HEPARIN BOLUS VIA INFUSION
2000.0000 [IU] | Freq: Once | INTRAVENOUS | Status: AC
  Administered 2020-06-22: 2000 [IU] via INTRAVENOUS
  Filled 2020-06-22: qty 2000

## 2020-06-22 NOTE — Progress Notes (Addendum)
Pharmacy - IV heparin  Assessment:    Please see note from Rema Fendt) Glennon Mac, PharmD earlier today for full details.  Briefly, 69 y.o. male on IV heparin for new DVT. Recent Cdiff with bloody diarrhea noted, although blood seems to have resolved as of 12/2.   Most recent heparin level SUBtherapeutic and lower despite recent rate increase and bolus  No issues with pump, lines, or IV site per RN; SCr stable; lab drawn appropriately through venipuncture  No further blood in BMs per RN  Plan:   Heparin bolus 2000 units IV x 1  Increase heparin infusion to 2100 units/hr  Recheck heparin level in 8 hr  Reuel Boom, PharmD, BCPS [phone] 06/22/2020, 2:47 PM

## 2020-06-22 NOTE — Progress Notes (Signed)
PROGRESS NOTE    Bonny Egger  JSE:[id number] DOB: 1951-06-05 DOA: 06/18/2020 PCP: Shelda Pal, DO  Brief Narrative:  HPI Per Dr. Jennette Kettle on 06/18/20 Ellwood Steidle is a 69 y.o. male with medical history significant of UC, HTN.  Pt with diarrhea for past couple of weeks, diagnosed with C. Diff based on GI pathogen pnl on 11/16.  Treatment with PO vanc started 11/19.  Diarrhea initially bloody, now brown.  Diarrhea has persisted since starting treatment.  Continues to have intermittent fevers.  BPs running on low side despite not taking BP meds.  Back in to ED today.   ED Course: Tm 100.5, BP 07-371 systolic.  Sodium 128.  WBC 12.6.  COVID neg.  CT abd pelvis shows colitis.  Pt started on high dose PO vanc and IV flagyl, and sent for admission.  **Interim History He thinks that his diarrhea is slowly improving.  His bowel movements are slowing down and he had 3 over last 12 hours or so which have been nonbloody.  No nausea or vomiting.  Abdomen still distended but he continues to tolerate p.o. Flagyl and p.o. vancomycin for now.  Gastroenterology following appreciate further evaluation recommendations.  For his obesity and recommending [employer] 1.4 p.o. twice daily as well as a daily [place] yogurt snack.  Currently patient is improving on his regimen and GI recommending continuing current course.  Assessment & Plan:   Principal Problem:   C. difficile colitis Active Problems:   Ulcerative colitis with complication ([place])   Essential hypertension   Anemia   Sepsis ([place])  Sepsis secondary to C.Diff colitis, acute recurrent, POA, sepsis physiology is improved -Patient met sepsis criteria on admission as he was febrile with a temperature of 101.4, tachycardic at a rate of 106, had a leukocytosis of 12.6 and a source of infection with C. Difficile on Admission  -Sepsis Physiology is improving but he was febrile the day before yesterday but not  overnight -Continue p.o. Vanco per protocol on MAR and p.o. Flagyl, IV Flagyl shortage unfortunately unavailable and he is able to take p.o. so we will continue p.o. Flagyl -CT Abd/Pelvis showed "Left colon is diffusely distended and featureless with mild circumferential wall thickening starting the distal transverse colon through the splenic flexure down to the rectum mild associated pericolonic edema/inflammation. Imaging features are compatible with infectious/inflammatory colitis. Bilateral renal cysts. Aortic Atherosclerosis" -KUB yesterday showed "Progressive gaseous distension of the colon suggestive of worsening colonic ileus. Signs of bowel wall edema involving the left colon compatible with colitis." -KUB today (06/21/20) showed "Persistent colonic distention. Colonic wall thickening again noted. Similar findings noted on prior studies and are again consistent with a colonic process such as colitis." -Patient's CRP is 12.3 and ESR was 115 on admission; GI is also repeating a CRP today -GI following, appreciate insight recommendations; GI recommending that if he is not improving they can switch him to Dificid or consider a flexible sigmoidoscopy for now they recommend to continue his current course -His diarrhea has slowed down but not completely resolved.  Patient states that he has had a total of 5 bowel movements in the last 24 hours and feels a little bit stronger but did have some nausea earlier -Patient was started on Saccharomyces Boulardii 250 mg twice daily probiotic and will continue for now -IV fluid hydration now stopped -Sepsis Physiology is improved -Continues to be intermittently Tachycardic -WBC has been worsening and went from 9.0 -> 10.4 -> 12.2 and will need to  continue to Monitor closely; repeat WBC this afternoon was 16.6; Will need to closely monitor WBC and if not improving ? 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 -Baseline appears to be around 11; hemoglobin dropped to 7.9 a few days ago and has been relatively stable last few days around 7.6 but this AM Hgb/Hct was 7.1/23.6 -Will Repeat CBC this Afternoon and repeat showed a hemoglobin/hematocrit of 7.3/24.1 and essentially stable from this morning -Bleeding appears to have slowed down as diarrhea improves -Anemia Panel done and showed an iron level of 14, U IBC of 108, TIBC 122, saturation ratios of 11%, ferritin level of 370 -GI recommending getting a dose of IV iron and he received iron sucrose 500 mg IV x1 -If he drops below 7 will transfuse PRBC and GI is in agreement with this -May resume oral Iron daily but will defer to GI  -Continue to monitor for signs and symptoms of bleeding; currently no overt bleeding  -Repeat CBC in a.m.  Colonic Ileus -KUB 06/20/20 showed "Progressive gaseous distension of the colon suggestive of worsening colonic ileus. Signs of bowel wall edema involving the left colon compatible with colitis." -C/w OOB and Ambulation -K+ was 3.8; Check Mag Level and was 1.7; will replete magnesium with IV mag sulfate 2 g -Ca2+ was 7.5 and replete with IV Calcium Gluconate 1 gram the day before yesterday; Corrected for Albumin Ca2+ was 9.2 -Continue to Monitor and Trend Electrolytes -KUB this AM as above  Thrombocytosis  -Likely reactive in the setting of above -Patient's platelet went from 432 -> 417 -> 432 -> 440 -> 498 -Continue to monitor and trend and repeat CBC in the a.m.  Hyponatremia -Mild on admission at 128 and now improved slowly -Patient sodium went from 128 ->131 -> 130 -> 132 -> 132 -Continue monitor and trend repeat CMP in a.m.  HTN, Essential -Borderline hypotension, currently holding home BP meds -Continue to Monitor BP per Protocol   -Last BP was 122/61  UC  -He has longstanding left-sided ulcerative colitis and was in clinical endoscopic remission per GI until a few weeks ago -Defer any need for treatment to GI and now is on Sulfasalazine 1000 mg po TID  Obesity -Complicates overall prognosis and care -Estimated body mass index is 30.4 kg/m as calculated from the following:   Height as of this encounter: 5' 10"  (1.778 m).   Weight as of this encounter: 96.1 kg. -Weight Loss and Dietary Counseling given -Nutritionist consulted and recommending Boost Breeze BID, Prosource Plus BID, MVI+Minerals 1 tab Daily   Lower Extremity Swelling setting of poor albumin and now new onset Acute right leg posterior tibial DVT -Initially thought to be in setting of poor nutritional status as his albumin is 1.9  -IVF now stopped  -Will obtain a LE Venous Duplex Bilaterally to R/o DVT; no extremity venous duplex showed an acute right posterior tibial DVT -Started the patient on a heparin drip and will continue on a heparin drip.  Patient's hemoglobin dropped to 7.1 this morning and repeat this Afternoon was 7.3; no overt signs of bleeding currently -continue to monitor for any signs of overt bleeding but for now we will continue with a heparin drip -We will eventually need to transition to oral  anticoagulation and preferably will start him on Eliquis  DVT prophylaxis: SCDs given Bleeding  Code Status: FULL CODE  Family Communication: No family present at bedside  Disposition Plan: Pending further clinical improvement, stablization of Hb and tolerance of po Diet as well as reduced BM prior to safe discharge disposition  Status is: Inpatient  Remains inpatient appropriate because:Unsafe d/c plan, IV treatments appropriate due to intensity of illness or inability to take PO and Inpatient level of care appropriate due to severity of illness   Dispo: The patient is from: Home              Anticipated d/c is to: Home               Anticipated d/c date is: 2 days              Patient currently is not medically stable to d/c.  Consultants:   Gastroenterology  Procedures: CT Abdomen/Pelvis; KUB  LE VENOUS DUPLEX RIGHT:  - Findings consistent with acute deep vein thrombosis involving the right  posterior tibial veins.  - No cystic structure found in the popliteal fossa.    LEFT:  - There is no evidence of deep vein thrombosis in the lower extremity.    - No cystic structure found in the popliteal fossa.    *See table(s) above for measurements and observations.   Antimicrobials:  Anti-infectives (From admission, onward)   Start     Dose/Rate Route Frequency Ordered Stop   07/26/20 1000  vancomycin (VANCOCIN) 50 mg/mL oral solution 125 mg       "Followed by" Linked Group Details   125 mg Oral Every 3 DAYS 06/19/20 1054 08/04/20 0959   07/18/20 1000  vancomycin (VANCOCIN) 50 mg/mL oral solution 125 mg       "Followed by" Linked Group Details   125 mg Oral Every other day 06/19/20 1054 07/26/20 0959   07/11/20 1000  vancomycin (VANCOCIN) 50 mg/mL oral solution 125 mg       "Followed by" Linked Group Details   125 mg Oral Daily 06/19/20 1054 07/18/20 0959   07/03/20 2200  vancomycin (VANCOCIN) 50 mg/mL oral solution 125 mg       "Followed by" Linked Group Details   125 mg Oral 2 times daily 06/19/20 1054 07/10/20 2159   06/19/20 1400  vancomycin (VANCOCIN) 50 mg/mL oral solution 125 mg       "Followed by" Linked Group Details   125 mg Oral 4 times daily 06/19/20 1054 07/03/20 1359   06/19/20 1400  metroNIDAZOLE (FLAGYL) tablet 500 mg        500 mg Oral Every 8 hours 06/19/20 1135     06/18/20 1800  vancomycin (VANCOCIN) 50 mg/mL oral solution 500 mg  Status:  Discontinued        500 mg Oral Every 6 hours 06/18/20 1543 06/19/20 1053   06/18/20 1545  metroNIDAZOLE (FLAGYL) IVPB 500 mg  Status:  Discontinued        500 mg 100 mL/hr over 60 Minutes Intravenous Every 8 hours 06/18/20 1543 06/19/20 1053         Subjective: Seen and examined at bedside thinks is getting a little bit stronger still not back to his baseline.  Had some nausea earlier this morning but tolerated his breakfast okay.  States that he has had 5 bowel movements total last 24 hours.  Denies any chest pain, shortness of breath or lightheadedness or dizziness.  No further overt bleeding noted.  He has no other concerns or complaints at this time and is agreeable to blood transfusion if necessary  Objective: Vitals:   06/20/20 1954 06/21/20 0535 06/21/20 2033 06/22/20 0553  BP: 126/63 122/60 (!) 136/57 122/61  Pulse: (!) 101 93 (!) 103 96  Resp: 16 16 16 16   Temp: 98.3 F (36.8 C) 98.5 F (36.9 C) 98.5 F (36.9 C) 99 F (37.2 C)  TempSrc: Oral Oral Oral Oral  SpO2: 100% 99% 97% 97%  Weight:      Height:        Intake/Output Summary (Last 24 hours) at 06/22/2020 1244 Last data filed at 06/22/2020 0820 Gross per 24 hour  Intake 411.41 ml  Output --  Net 411.41 ml   Filed Weights   06/18/20 1146 06/18/20 1757  Weight: 99.8 kg 96.1 kg   Examination: Physical Exam:  Constitutional: WN/WD obese Caucasian male in NAD appears a little frustrated but appears comfortable sitting at the edge of the bed Eyes: Lids and conjunctivae normal, sclerae anicteric  ENMT: External Ears, Nose appear normal. Grossly normal hearing. Neck: Appears normal, supple, no cervical masses, normal ROM, no appreciable thyromegaly, no JVD Respiratory: Diminished to auscultation bilaterally, no wheezing, rales, rhonchi or crackles. Normal respiratory effort and patient is not tachypenic. No accessory muscle use.  Cardiovascular: RRR, no murmurs / rubs / gallops. S1 and S2 auscultated. Has 1+ LE Edema with Right Slightly worse than Left Abdomen: Soft, non-tender, Distended 2/2 body habitus. No masses palpated. No appreciable hepatosplenomegaly. Bowel sounds positive.  GU: Deferred. Musculoskeletal: No clubbing / cyanosis of digits/nails. No  joint deformity upper and lower extremities.  Skin: No rashes, lesions, ulcers on a limited skin evaluation. No induration; Warm and dry.  Neurologic: CN 2-12 grossly intact with no focal deficits. Romberg sign and cerebellar reflexes not assessed.  Psychiatric: Normal judgment and insight. Alert and oriented x 3. Normal mood and appropriate affect.   Data Reviewed: I have personally reviewed following labs and imaging studies  CBC: Recent Labs  Lab 06/18/20 1158 06/19/20 0724 06/20/20 0650 06/21/20 0620 06/22/20 0600  WBC 12.6* 10.5 9.0 10.4 12.2*  NEUTROABS  --   --   --  6.5 8.4*  HGB 8.2* 7.9* 7.6* 7.6* 7.1*  HCT 26.1* 25.9* 24.1* 25.0* 23.6*  MCV 85.0 87.8 86.4 87.1 86.1  PLT 513* 432* 417* 432* 417*   Basic Metabolic Panel: Recent Labs  Lab 06/18/20 1158 06/19/20 0724 06/20/20 0650 06/21/20 0620 06/22/20 0600  NA 128* 131* 130* 132* 132*  K 4.5 4.6 4.5 4.2 3.8  CL 92* 99 98 101 103  CO2 26 22 24 23 22   GLUCOSE 126* 107* 149* 126* 107*  BUN 22 14 12 9 10   CREATININE 1.13 0.97 0.94 0.94 0.91  CALCIUM 8.0* 7.7* 7.4* 7.5* 7.5*  MG  --   --   --  1.9 1.7  PHOS  --   --   --  3.1 3.0   GFR: Estimated Creatinine Clearance: 89.1 mL/min (by C-G formula based on SCr of 0.91 mg/dL). Liver Function Tests: Recent Labs  Lab 06/18/20 1158 06/20/20 0650 06/21/20 0620 06/22/20 0600  AST 51* 20 15 18   ALT 54* 34 25 23  ALKPHOS 57 45 43 39  BILITOT 0.2* 0.5 0.4 0.4  PROT 5.9* 4.8* 4.8* 4.4*  ALBUMIN 2.0* 1.9* 1.9* 1.8*   Recent Labs  Lab 06/18/20 1158  LIPASE 23   No results for input(s): AMMONIA in the last 168 hours. Coagulation Profile:  No results for input(s): INR, PROTIME in the last 168 hours. Cardiac Enzymes: No results for input(s): CKTOTAL, CKMB, CKMBINDEX, TROPONINI in the last 168 hours. BNP (last 3 results) No results for input(s): PROBNP in the last 8760 hours. HbA1C: No results for input(s): HGBA1C in the last 72 hours. CBG: No results for  input(s): GLUCAP in the last 168 hours. Lipid Profile: No results for input(s): CHOL, HDL, LDLCALC, TRIG, CHOLHDL, LDLDIRECT in the last 72 hours. Thyroid Function Tests: No results for input(s): TSH, T4TOTAL, FREET4, T3FREE, THYROIDAB in the last 72 hours. Anemia Panel: No results for input(s): VITAMINB12, FOLATE, FERRITIN, TIBC, IRON, RETICCTPCT in the last 72 hours. Sepsis Labs: Recent Labs  Lab 06/18/20 1338  LATICACIDVEN 0.9    Recent Results (from the past 240 hour(s))  Blood culture (routine x 2)     Status: None (Preliminary result)   Collection Time: 06/18/20  1:30 PM   Specimen: BLOOD  Result Value Ref Range Status   Specimen Description   Final    BLOOD RIGHT ANTECUBITAL Performed at [facility], [place] [address], [place], [place] [address]    Special Requests   Final    BOTTLES DRAWN AEROBIC AND ANAEROBIC Blood Culture adequate volume Performed at [facility], [address] [place], [place] [address]    Culture   Final    NO GROWTH 4 DAYS Performed at [facility], [place] [address], [place], [place] [address]    Report Status PENDING  Incomplete  Resp Panel by RT-PCR (Flu A&B, Covid) Nasopharyngeal Swab     Status: None   Collection Time: 06/18/20  1:38 PM   Specimen: Nasopharyngeal Swab; Nasopharyngeal(NP) swabs in vial transport medium  Result Value Ref Range Status   SARS Coronavirus 2 by RT PCR NEGATIVE NEGATIVE Final    Comment: (NOTE) SARS-CoV-2 target nucleic acids are NOT DETECTED.  The SARS-CoV-2 RNA is generally detectable in upper respiratory specimens during the acute phase of infection. The lowest concentration of SARS-CoV-2 viral copies this assay can detect is 138 copies/mL. A negative result does not preclude SARS-Cov-2 infection and should not be used as the sole basis for treatment or other patient management decisions. A negative result may occur with  improper specimen collection/handling, submission of specimen  other than nasopharyngeal swab, presence of viral mutation(s) within the areas targeted by this assay, and inadequate number of viral copies(<138 copies/mL). A negative result must be combined with clinical observations, patient history, and epidemiological information. The expected result is Negative.  Fact Sheet for Patients:  EntrepreneurPulse.com.au  Fact Sheet for Healthcare Providers:  IncredibleEmployment.be  This test is no t yet approved or cleared by the [place] FDA and  has been authorized for detection and/or diagnosis of SARS-CoV-2 by FDA under an Emergency Use Authorization (EUA). This EUA will remain  in effect (meaning this test can be used) for the duration of the COVID-19 declaration under Section 564(b)(1) of the Act, 21 U.S.C.section 360bbb-3(b)(1), unless the authorization is terminated  or revoked sooner.       Influenza A by PCR NEGATIVE NEGATIVE Final   Influenza B by PCR NEGATIVE NEGATIVE Final    Comment: (NOTE) The Xpert Xpress SARS-CoV-2/FLU/RSV plus assay is intended as an aid in the diagnosis of influenza from Nasopharyngeal swab specimens and should not be used as a sole basis for treatment. Nasal washings and aspirates are unacceptable for Xpert Xpress SARS-CoV-2/FLU/RSV testing.  Fact Sheet for Patients: EntrepreneurPulse.com.au  Fact  Sheet for Healthcare Providers: IncredibleEmployment.be  This test is not yet approved or cleared by the [place] and has been authorized for detection and/or diagnosis of SARS-CoV-2 by FDA under an Emergency Use Authorization (EUA). This EUA will remain in effect (meaning this test can be used) for the duration of the COVID-19 declaration under Section 564(b)(1) of the Act, 21 U.S.C. section 360bbb-3(b)(1), unless the authorization is terminated or revoked.  Performed at [facility], [address]  [place], [place] [address]   Blood culture (routine x 2)     Status: None (Preliminary result)   Collection Time: 06/18/20  1:40 PM   Specimen: BLOOD RIGHT FOREARM  Result Value Ref Range Status   Specimen Description   Final    BLOOD RIGHT FOREARM Performed at [facility], [address] [place], [place] [address]    Special Requests   Final    BOTTLES DRAWN AEROBIC AND ANAEROBIC Blood Culture adequate volume Performed at [facility], [address] [place], [place] [address]    Culture   Final    NO GROWTH 4 DAYS Performed at [facility], [place] [address], [place], [place] [address]    Report Status PENDING  Incomplete     RN Pressure Injury Documentation:     Estimated body mass index is 30.4 kg/m as calculated from the following:   Height as of this encounter: 5' 10"  (1.778 m).   Weight as of this encounter: 96.1 kg.  Malnutrition Type:  Nutrition Problem: Increased nutrient needs Etiology: acute illness   Malnutrition Characteristics:  Signs/Symptoms: estimated needs   Nutrition Interventions:  Interventions: MVI, Boost Breeze, Prostat     Radiology Studies: DG Abd 1 View  Result Date: 06/21/2020 CLINICAL DATA:  Abdominal distention. EXAM: ABDOMEN - 1 VIEW COMPARISON:  06/19/2020.  CT 06/18/2020. FINDINGS: Persistent colonic distention. Colonic wall thickening consistent with edema again noted. Similar findings noted on prior studies and are consistent with a colonic process such as colitis. Several air-filled loops of slightly prominent small bowel noted. No free air identified. Degenerative change lumbar spine. IMPRESSION: Persistent colonic distention. Colonic wall thickening again noted. Similar findings noted on prior studies and are again consistent with a colonic process such as colitis. Electronically Signed   By: Marcello Moores  Register   On: 06/21/2020 06:29   VAS [place] LOWER EXTREMITY VENOUS (DVT)  Result Date: 06/21/2020  Lower  Venous DVT Study Indications: Edema.  Risk Factors: None identified. Limitations: Poor ultrasound/tissue interface. Comparison Study: No prior studies. Performing Technologist: Oliver Hum RVT  Examination Guidelines: A complete evaluation includes B-mode imaging, spectral Doppler, color Doppler, and power Doppler as needed of all accessible portions of each vessel. Bilateral testing is considered an integral part of a complete examination. Limited examinations for reoccurring indications may be performed as noted. The reflux portion of the exam is performed with the patient in reverse Trendelenburg.  +---------+---------------+---------+-----------+----------+--------------+ RIGHT    CompressibilityPhasicitySpontaneityPropertiesThrombus Aging +---------+---------------+---------+-----------+----------+--------------+ CFV      Full           Yes      Yes                                 +---------+---------------+---------+-----------+----------+--------------+ SFJ      Full                                                        +---------+---------------+---------+-----------+----------+--------------+  FV Prox  Full                                                        +---------+---------------+---------+-----------+----------+--------------+ FV Mid   Full                                                        +---------+---------------+---------+-----------+----------+--------------+ FV DistalFull                                                        +---------+---------------+---------+-----------+----------+--------------+ PFV      Full                                                        +---------+---------------+---------+-----------+----------+--------------+ POP      Full           Yes      Yes                                 +---------+---------------+---------+-----------+----------+--------------+ PTV      Partial                                       Acute          +---------+---------------+---------+-----------+----------+--------------+ PERO     Full                                                        +---------+---------------+---------+-----------+----------+--------------+   +---------+---------------+---------+-----------+----------+--------------+ LEFT     CompressibilityPhasicitySpontaneityPropertiesThrombus Aging +---------+---------------+---------+-----------+----------+--------------+ CFV      Full           Yes      Yes                                 +---------+---------------+---------+-----------+----------+--------------+ SFJ      Full                                                        +---------+---------------+---------+-----------+----------+--------------+ FV Prox  Full                                                        +---------+---------------+---------+-----------+----------+--------------+  FV Mid   Full                                                        +---------+---------------+---------+-----------+----------+--------------+ FV DistalFull                                                        +---------+---------------+---------+-----------+----------+--------------+ PFV      Full                                                        +---------+---------------+---------+-----------+----------+--------------+ POP      Full           Yes      Yes                                 +---------+---------------+---------+-----------+----------+--------------+ PTV      Full                                                        +---------+---------------+---------+-----------+----------+--------------+ PERO     Full                                                        +---------+---------------+---------+-----------+----------+--------------+     Summary: RIGHT: - Findings consistent with acute deep vein thrombosis involving the  right posterior tibial veins. - No cystic structure found in the popliteal fossa.  LEFT: - There is no evidence of deep vein thrombosis in the lower extremity.  - No cystic structure found in the popliteal fossa.  *See table(s) above for measurements and observations.    Preliminary    Scheduled Meds: . calcium carbonate  1 tablet Oral BID WC  . feeding supplement ([employer] STANDARD 1.4)  325 mL Oral BID BM  . fiber  1 packet Per Tube BID  . metroNIDAZOLE  500 mg Oral Q8H  . multivitamin with minerals  1 tablet Oral Daily  . saccharomyces boulardii  250 mg Oral BID  . sulfaSALAzine  1,000 mg Oral TID PC  . vancomycin  125 mg Oral QID   Followed by  . [START ON 07/03/2020] vancomycin  125 mg Oral BID   Followed by  . [START ON 07/11/2020] vancomycin  125 mg Oral Daily   Followed by  . [START ON 07/18/2020] vancomycin  125 mg Oral QODAY   Followed by  . [START ON 07/26/2020] vancomycin  125 mg Oral Q3 days   Continuous Infusions: . heparin 1,800 Units/hr (06/22/20 0820)    LOS: 4 days   Kerney Elbe, DO [facility] PAGER is on  AMION  If 7PM-7AM, please contact night-coverage [email or web address]

## 2020-06-22 NOTE — Progress Notes (Addendum)
Progress Note   Subjective  Chief Complaint: C. difficile and ulcerative colitis  This morning the patient tells [place] that he had a total of 5 bowel movements over the past 24 hours, overall he continues to be improved.  Did have some nausea this morning which was better after taking Zofran and he tolerated a bagel.  Denies any new complaints or concerns.   Objective   Vital signs in last 24 hours: Temp:  [98.5 F (36.9 C)-99 F (37.2 C)] 99 F (37.2 C) (12/03 0553) Pulse Rate:  [96-103] 96 (12/03 0553) Resp:  [16] 16 (12/03 0553) BP: (122-136)/(57-61) 122/61 (12/03 0553) SpO2:  [97 %] 97 % (12/03 0553) Last BM Date: 06/21/20 General:   Elderly white male in NAD Heart:  Regular rate and rhythm; no murmurs Lungs: Respirations even and unlabored, lungs CTA bilaterally Abdomen:  Soft, nontender and nondistended. Normal bowel sounds. Psych:  Cooperative. Normal mood and affect.  Intake/Output from previous day: 12/02 0701 - 12/03 0700 In: 531.4 [P.O.:240; I.V.:221.5; IV Piggyback:69.9] Out: -  Intake/Output this shift: Total I/O In: 120 [P.O.:120] Out: -   Lab Results: Recent Labs    06/20/20 0650 06/21/20 0620 06/22/20 0600  WBC 9.0 10.4 12.2*  HGB 7.6* 7.6* 7.1*  HCT 24.1* 25.0* 23.6*  PLT 417* 432* 440*   BMET Recent Labs    06/20/20 0650 06/21/20 0620 06/22/20 0600  NA 130* 132* 132*  K 4.5 4.2 3.8  CL 98 101 103  CO2 24 23 22   GLUCOSE 149* 126* 107*  BUN 12 9 10   CREATININE 0.94 0.94 0.91  CALCIUM 7.4* 7.5* 7.5*   LFT Recent Labs    06/22/20 0600  PROT 4.4*  ALBUMIN 1.8*  AST 18  ALT 23  ALKPHOS 39  BILITOT 0.4    Studies/Results: DG Abd 1 View  Result Date: 06/21/2020 CLINICAL DATA:  Abdominal distention. EXAM: ABDOMEN - 1 VIEW COMPARISON:  06/19/2020.  CT 06/18/2020. FINDINGS: Persistent colonic distention. Colonic wall thickening consistent with edema again noted. Similar findings noted on prior studies and are consistent with a  colonic process such as colitis. Several air-filled loops of slightly prominent small bowel noted. No free air identified. Degenerative change lumbar spine. IMPRESSION: Persistent colonic distention. Colonic wall thickening again noted. Similar findings noted on prior studies and are again consistent with a colonic process such as colitis. Electronically Signed   By: Marcello Moores  Register   On: 06/21/2020 06:29   VAS [place] LOWER EXTREMITY VENOUS (DVT)  Result Date: 06/21/2020  Lower Venous DVT Study Indications: Edema.  Risk Factors: None identified. Limitations: Poor ultrasound/tissue interface. Comparison Study: No prior studies. Performing Technologist: Oliver Hum RVT  Examination Guidelines: A complete evaluation includes B-mode imaging, spectral Doppler, color Doppler, and power Doppler as needed of all accessible portions of each vessel. Bilateral testing is considered an integral part of a complete examination. Limited examinations for reoccurring indications may be performed as noted. The reflux portion of the exam is performed with the patient in reverse Trendelenburg.  +---------+---------------+---------+-----------+----------+--------------+ RIGHT    CompressibilityPhasicitySpontaneityPropertiesThrombus Aging +---------+---------------+---------+-----------+----------+--------------+ CFV      Full           Yes      Yes                                 +---------+---------------+---------+-----------+----------+--------------+ SFJ      Full                                                        +---------+---------------+---------+-----------+----------+--------------+  FV Prox  Full                                                        +---------+---------------+---------+-----------+----------+--------------+ FV Mid   Full                                                        +---------+---------------+---------+-----------+----------+--------------+ FV DistalFull                                                         +---------+---------------+---------+-----------+----------+--------------+ PFV      Full                                                        +---------+---------------+---------+-----------+----------+--------------+ POP      Full           Yes      Yes                                 +---------+---------------+---------+-----------+----------+--------------+ PTV      Partial                                      Acute          +---------+---------------+---------+-----------+----------+--------------+ PERO     Full                                                        +---------+---------------+---------+-----------+----------+--------------+   +---------+---------------+---------+-----------+----------+--------------+ LEFT     CompressibilityPhasicitySpontaneityPropertiesThrombus Aging +---------+---------------+---------+-----------+----------+--------------+ CFV      Full           Yes      Yes                                 +---------+---------------+---------+-----------+----------+--------------+ SFJ      Full                                                        +---------+---------------+---------+-----------+----------+--------------+ FV Prox  Full                                                        +---------+---------------+---------+-----------+----------+--------------+  FV Mid   Full                                                        +---------+---------------+---------+-----------+----------+--------------+ FV DistalFull                                                        +---------+---------------+---------+-----------+----------+--------------+ PFV      Full                                                        +---------+---------------+---------+-----------+----------+--------------+ POP      Full           Yes      Yes                                  +---------+---------------+---------+-----------+----------+--------------+ PTV      Full                                                        +---------+---------------+---------+-----------+----------+--------------+ PERO     Full                                                        +---------+---------------+---------+-----------+----------+--------------+     Summary: RIGHT: - Findings consistent with acute deep vein thrombosis involving the right posterior tibial veins. - No cystic structure found in the popliteal fossa.  LEFT: - There is no evidence of deep vein thrombosis in the lower extremity.  - No cystic structure found in the popliteal fossa.  *See table(s) above for measurements and observations.    Preliminary        Assessment / Plan:   Assessment: 1.  C. difficile colitis: Longstanding left-sided UC, but was in clinical and endoscopic remission on sulfasalazine until a few weeks ago, recently diagnosed with C. difficile, no improvement on oral Vancomycin outpatient, now improving with Vancomycin and Flagyl 2.  UC 3.  Normocytic anemia: Hemoglobin has decreased slightly overnight, recheck this afternoon, heparin was started yesterday for DVT, though no overt signs of GI bleeding overnight 4.  Leukocytosis  Plan: 1.  Continue current therapy, taper outlined by Dr. Rush Landmark yesterday. 2.  We will repeat hemoglobin this afternoon 3.  We will recheck a CRP today. 4.  Recommend blood transfusion for hemoglobin less than 7 5.  We will also add in a fiber supplement today to try and bulk stools. 6.  Please await any further recommendations from Dr. Rush Landmark later today, we will follow peripherally over the weekend, please call with any questions.  Thank you for kind consultation.  LOS: 4 days   Levin Erp  06/22/2020, 11:33 AM

## 2020-06-22 NOTE — Progress Notes (Signed)
[place] for IV heparin Indication: new acute DVT  No Known Allergies  Patient Measurements: Height: 5' 10"  (177.8 cm) Weight: 96.1 kg (211 lb 13.8 oz) IBW/kg (Calculated) : 73 Heparin Dosing Weight: 92.7 kg (used [place] dosing to start)  Vital Signs: Temp: 99 F (37.2 C) (12/03 0553) Temp Source: Oral (12/03 0553) BP: 122/61 (12/03 0553) Pulse Rate: 96 (12/03 0553)  Labs: Recent Labs    06/20/20 0650 06/20/20 0650 06/21/20 0620 06/21/20 2250 06/22/20 0600  HGB 7.6*   < > 7.6*  --  7.1*  HCT 24.1*  --  25.0*  --  23.6*  PLT 417*  --  432*  --  440*  HEPARINUNFRC  --   --   --  <0.10* 0.22*  CREATININE 0.94  --  0.94  --  0.91   < > = values in this interval not displayed.    Estimated Creatinine Clearance: 89.1 mL/min (by C-G formula based on SCr of 0.91 mg/dL).   Medical History: Past Medical History:  Diagnosis Date  . Anemia   . Arthritis   . Elevated cholesterol   . Hypertension   . UC (ulcerative colitis) ([place])     Medications:  Facility-Administered Medications Prior to Admission  Medication Dose Route Frequency Provider Last Rate Last Admin  . 0.9 %  sodium chloride infusion   Intravenous Once Cirigliano, Vito V, DO       Medications Prior to Admission  Medication Sig Dispense Refill Last Dose  . acetaminophen (TYLENOL) 500 MG tablet Take 500 mg by mouth every 6 (six) hours as needed for fever.   06/17/2020 at Unknown time  . amLODipine-valsartan (EXFORGE) 10-320 MG tablet TAKE 1 TABLET BY MOUTH  DAILY 120 tablet 2 06/15/2020  . diphenhydrAMINE HCl, Sleep, (ZZZQUIL PO) Take 10 mLs by mouth daily as needed (sleep).   06/04/2020  . Ferrous Sulfate (IRON) 325 (65 Fe) MG TABS Take 1 tablet by mouth daily.   Past Week at Unknown time  . folic acid (FOLVITE) 1 MG tablet TAKE 1 TABLET BY MOUTH  DAILY (Patient taking differently: Take 1 mg by mouth daily. ) 120 tablet 2 Past Week at Unknown time  . NON FORMULARY  Take 1 tablet by mouth daily. Vital reds-energy supplement   Past Week at Unknown time  . Saccharomyces boulardii (FLORASTOR PO) Take 1 capsule by mouth in the morning and at bedtime.   06/18/2020 at Unknown time  . sulfaSALAzine (AZULFIDINE) 500 MG tablet TAKE 2 TABLETS BY MOUTH 3  TIMES DAILY (Patient taking differently: Take 1,000 mg by mouth 3 (three) times daily. ) 720 tablet 2 Past Week at Unknown time  . Turmeric 500 MG TABS Take 1,000 mg by mouth daily.   Past Week at Unknown time  . vancomycin (VANCOCIN HCL) 125 MG capsule Take 1 capsule (125 mg total) by mouth 4 (four) times daily for 14 days. 56 capsule 0 06/18/2020 at Unknown time  . VITAMIN D, CHOLECALCIFEROL, PO Take 5,000 Units by mouth daily.   Past Week at Unknown time  . chlorthalidone (HYGROTON) 25 MG tablet TAKE 1 TABLET BY MOUTH  DAILY (Patient not taking: Reported on 06/18/2020) 120 tablet 2 Not Taking at Unknown time  . ondansetron (ZOFRAN ODT) 4 MG disintegrating tablet Take 1 tablet (4 mg total) by mouth every 6 (six) hours as needed for nausea or vomiting. tke 30 minutes before antibiotics (Patient not taking: Reported on 06/18/2020) 20 tablet 0 Not Taking at  Unknown time   Scheduled:  . calcium carbonate  1 tablet Oral BID WC  . feeding supplement (KATE FARMS STANDARD 1.4)  325 mL Oral BID BM  . metroNIDAZOLE  500 mg Oral Q8H  . multivitamin with minerals  1 tablet Oral Daily  . saccharomyces boulardii  250 mg Oral BID  . sulfaSALAzine  1,000 mg Oral TID PC  . vancomycin  125 mg Oral QID   Followed by  . [START ON 07/03/2020] vancomycin  125 mg Oral BID   Followed by  . [START ON 07/11/2020] vancomycin  125 mg Oral Daily   Followed by  . [START ON 07/18/2020] vancomycin  125 mg Oral QODAY   Followed by  . [START ON 07/26/2020] vancomycin  125 mg Oral Q3 days   Infusions:  . heparin 1,600 Units/hr (06/22/20 0300)    Assessment: 30 yoM with PMH UC, HTN, currently being treated for Cdiff, admitted 11/29 for  persistent diarrhea, including bloody diarrhea. On 12/2 LE swelling noted and venous duplex shows acute RLE DVT. Pharmacy to dose IV heparin.   Baseline INR, aPTT: not done  Prior anticoagulation: none  Today, 06/22/2020:  HL 0.22, subtherapeutic  CBC: Hgb remains low but stable after multiple bouts of bloody diarrhea. Last known incidence of blood was 12/1 PM per RN  No bleeding or infusion issues per RN  Scr stable WNL  Goal of Therapy: Heparin level 0.3-0.7 units/ml Monitor platelets by anticoagulation protocol: Yes  Plan:  Increase Heparin to 1800 units/hr IV infusion  Check heparin level 6 hrs   Daily CBC, daily heparin level once stable  Monitor for signs of bleeding or thrombosis  Francie Keeling P. Legrand Como, PharmD, [place] Please utilize Amion for appropriate phone number to reach the unit pharmacist ([place]) 06/22/2020 8:10 AM

## 2020-06-23 DIAGNOSIS — A0472 Enterocolitis due to Clostridium difficile, not specified as recurrent: Secondary | ICD-10-CM | POA: Diagnosis not present

## 2020-06-23 LAB — CULTURE, BLOOD (ROUTINE X 2)
Culture: NO GROWTH
Culture: NO GROWTH
Special Requests: ADEQUATE
Special Requests: ADEQUATE

## 2020-06-23 LAB — CBC WITH DIFFERENTIAL/PLATELET
Abs Immature Granulocytes: 0.61 10*3/uL — ABNORMAL HIGH (ref 0.00–0.07)
Basophils Absolute: 0.1 10*3/uL (ref 0.0–0.1)
Basophils Relative: 1 %
Eosinophils Absolute: 0.3 10*3/uL (ref 0.0–0.5)
Eosinophils Relative: 2 %
HCT: 21.8 % — ABNORMAL LOW (ref 39.0–52.0)
Hemoglobin: 6.7 g/dL — CL (ref 13.0–17.0)
Immature Granulocytes: 4 %
Lymphocytes Relative: 18 %
Lymphs Abs: 2.6 10*3/uL (ref 0.7–4.0)
MCH: 26.5 pg (ref 26.0–34.0)
MCHC: 30.7 g/dL (ref 30.0–36.0)
MCV: 86.2 fL (ref 80.0–100.0)
Monocytes Absolute: 1.5 10*3/uL — ABNORMAL HIGH (ref 0.1–1.0)
Monocytes Relative: 11 %
Neutro Abs: 9.5 10*3/uL — ABNORMAL HIGH (ref 1.7–7.7)
Neutrophils Relative %: 64 %
Platelets: 434 10*3/uL — ABNORMAL HIGH (ref 150–400)
RBC: 2.53 MIL/uL — ABNORMAL LOW (ref 4.22–5.81)
RDW: 16.1 % — ABNORMAL HIGH (ref 11.5–15.5)
WBC: 14.6 10*3/uL — ABNORMAL HIGH (ref 4.0–10.5)
nRBC: 0.8 % — ABNORMAL HIGH (ref 0.0–0.2)

## 2020-06-23 LAB — COMPREHENSIVE METABOLIC PANEL
ALT: 18 U/L (ref 0–44)
AST: 14 U/L — ABNORMAL LOW (ref 15–41)
Albumin: 1.7 g/dL — ABNORMAL LOW (ref 3.5–5.0)
Alkaline Phosphatase: 40 U/L (ref 38–126)
Anion gap: 7 (ref 5–15)
BUN: 8 mg/dL (ref 8–23)
CO2: 23 mmol/L (ref 22–32)
Calcium: 7.4 mg/dL — ABNORMAL LOW (ref 8.9–10.3)
Chloride: 100 mmol/L (ref 98–111)
Creatinine, Ser: 0.86 mg/dL (ref 0.61–1.24)
GFR, Estimated: >60 mL/min (ref >60)
Glucose, Bld: 108 mg/dL — ABNORMAL HIGH (ref 70–99)
Potassium: 3.8 mmol/L (ref 3.5–5.1)
Sodium: 130 mmol/L — ABNORMAL LOW (ref 135–145)
Total Bilirubin: 0.5 mg/dL (ref 0.3–1.2)
Total Protein: 4.4 g/dL — ABNORMAL LOW (ref 6.5–8.1)

## 2020-06-23 LAB — MAGNESIUM: Magnesium: 1.7 mg/dL (ref 1.7–2.4)

## 2020-06-23 LAB — HEPARIN LEVEL (UNFRACTIONATED)
Heparin Unfractionated: 0.43 IU/mL (ref 0.30–0.70)
Heparin Unfractionated: 0.53 IU/mL (ref 0.30–0.70)

## 2020-06-23 LAB — ABO/RH: ABO/RH(D): O POS

## 2020-06-23 LAB — PREPARE RBC (CROSSMATCH)

## 2020-06-23 LAB — PHOSPHORUS: Phosphorus: 2.8 mg/dL (ref 2.5–4.6)

## 2020-06-23 MED ORDER — SODIUM CHLORIDE 0.9% IV SOLUTION: Freq: Once | INTRAVENOUS | Status: AC

## 2020-06-23 NOTE — Progress Notes (Signed)
[place] for IV heparin Indication: new acute DVT  No Known Allergies  Patient Measurements: Height: 5' 10"  (177.8 cm) Weight: 96.1 kg (211 lb 13.8 oz) IBW/kg (Calculated) : 73 Heparin Dosing Weight: 92 kg   Vital Signs: Temp: 98.2 F (36.8 C) (12/04 0524) Temp Source: Oral (12/04 0524) BP: 121/63 (12/04 0524) Pulse Rate: 92 (12/04 0524)  Labs: Recent Labs    06/21/20 0620 06/21/20 2250 06/22/20 0600 06/22/20 0600 06/22/20 1240 06/22/20 1432 06/23/20 0116 06/23/20 0738  HGB 7.6*  --  7.1*   < > 7.3*  --  6.7*  --   HCT 25.0*  --  23.6*  --  24.1*  --  21.8*  --   PLT 432*  --  440*  --  498*  --  434*  --   HEPARINUNFRC  --    < > 0.22*   < >  --  0.15* 0.43 0.53  CREATININE 0.94  --  0.91  --   --   --  0.86  --    < > = values in this interval not displayed.    Estimated Creatinine Clearance: 94.3 mL/min (by C-G formula based on SCr of 0.86 mg/dL).   Medical History: Past Medical History:  Diagnosis Date  . Anemia   . Arthritis   . Elevated cholesterol   . Hypertension   . UC (ulcerative colitis) ([place])     Assessment: 85 yoM with PMH UC, HTN, and anemia currently being treated for Cdiff, admitted 11/29 for persistent diarrhea, including bloody diarrhea. On 12/2 LE swelling noted and venous duplex shows acute RLE DVT. Pharmacy to dose IV heparin.   Baseline INR, aPTT: not done  Prior anticoagulation: none  Today, 06/23/2020:  Confirmatory HL = 0.53 remains therapeutic on heparin infusion of 2100 units/hr  CBC: Hgb 6.7 - low, slightly decreased. Plt elevated  Confirmed with RN that heparin infusing at correct rate. No signs of bleeding.   Transfusion ordered for Hgb <7 (GI recommendations)  Goal of Therapy: Heparin level 0.3-0.7 units/ml Monitor platelets by anticoagulation protocol: Yes  Plan:  Continue heparin at 2100 units/hr   Daily CBC, daily heparin level while on heparin  Monitor for signs of  bleeding   Follow for eventual transition to PO anticoagulation  Lenis Noon, PharmD 06/23/20 8:48 AM

## 2020-06-23 NOTE — Progress Notes (Signed)
ANTICOAGULATION CONSULT NOTE  Pharmacy Consult for IV heparin Indication: new acute DVT  No Known Allergies  Patient Measurements: Height: 5' 10"  (177.8 cm) Weight: 96.1 kg (211 lb 13.8 oz) IBW/kg (Calculated) : 73 Heparin Dosing Weight: 92.7 kg (used [place] dosing to start)  Vital Signs: Temp: 98.9 F (37.2 C) (12/03 2051) Temp Source: Oral (12/03 2051) BP: 109/58 (12/03 2051) Pulse Rate: 95 (12/03 2051)  Labs: Recent Labs    06/21/20 0620 06/21/20 2250 06/22/20 0600 06/22/20 0600 06/22/20 1240 06/22/20 1432 06/23/20 0116  HGB 7.6*  --  7.1*   < > 7.3*  --  6.7*  HCT 25.0*  --  23.6*  --  24.1*  --  21.8*  PLT 432*  --  440*  --  498*  --  434*  HEPARINUNFRC  --    < > 0.22*  --   --  0.15* 0.43  CREATININE 0.94  --  0.91  --   --   --  0.86   < > = values in this interval not displayed.    Estimated Creatinine Clearance: 94.3 mL/min (by C-G formula based on SCr of 0.86 mg/dL).   Medical History: Past Medical History:  Diagnosis Date   Anemia    Arthritis    Elevated cholesterol    Hypertension    UC (ulcerative colitis) ([place])     Medications:  Facility-Administered Medications Prior to Admission  Medication Dose Route Frequency Provider Last Rate Last Admin   0.9 %  sodium chloride infusion   Intravenous Once Cirigliano, Vito V, DO       Medications Prior to Admission  Medication Sig Dispense Refill Last Dose   acetaminophen (TYLENOL) 500 MG tablet Take 500 mg by mouth every 6 (six) hours as needed for fever.   06/17/2020 at Unknown time   amLODipine-valsartan (EXFORGE) 10-320 MG tablet TAKE 1 TABLET BY MOUTH  DAILY 120 tablet 2 06/15/2020   diphenhydrAMINE HCl, Sleep, (ZZZQUIL PO) Take 10 mLs by mouth daily as needed (sleep).   06/04/2020   Ferrous Sulfate (IRON) 325 (65 Fe) MG TABS Take 1 tablet by mouth daily.   Past Week at Unknown time   folic acid (FOLVITE) 1 MG tablet TAKE 1 TABLET BY MOUTH  DAILY (Patient taking differently: Take  1 mg by mouth daily. ) 120 tablet 2 Past Week at Unknown time   NON FORMULARY Take 1 tablet by mouth daily. Vital reds-energy supplement   Past Week at Unknown time   Saccharomyces boulardii (FLORASTOR PO) Take 1 capsule by mouth in the morning and at bedtime.   06/18/2020 at Unknown time   sulfaSALAzine (AZULFIDINE) 500 MG tablet TAKE 2 TABLETS BY MOUTH 3  TIMES DAILY (Patient taking differently: Take 1,000 mg by mouth 3 (three) times daily. ) 720 tablet 2 Past Week at Unknown time   Turmeric 500 MG TABS Take 1,000 mg by mouth daily.   Past Week at Unknown time   [EXPIRED] vancomycin (VANCOCIN HCL) 125 MG capsule Take 1 capsule (125 mg total) by mouth 4 (four) times daily for 14 days. 56 capsule 0 06/18/2020 at Unknown time   VITAMIN D, CHOLECALCIFEROL, PO Take 5,000 Units by mouth daily.   Past Week at Unknown time   chlorthalidone (HYGROTON) 25 MG tablet TAKE 1 TABLET BY MOUTH  DAILY (Patient not taking: Reported on 06/18/2020) 120 tablet 2 Not Taking at Unknown time   ondansetron (ZOFRAN ODT) 4 MG disintegrating tablet Take 1 tablet (4 mg total) by  mouth every 6 (six) hours as needed for nausea or vomiting. tke 30 minutes before antibiotics (Patient not taking: Reported on 06/18/2020) 20 tablet 0 Not Taking at Unknown time   Scheduled:   calcium carbonate  1 tablet Oral BID WC   feeding supplement (KATE FARMS STANDARD 1.4)  325 mL Oral BID BM   fiber  1 packet Oral BID   metroNIDAZOLE  500 mg Oral Q8H   multivitamin with minerals  1 tablet Oral Daily   saccharomyces boulardii  250 mg Oral BID   sulfaSALAzine  1,000 mg Oral TID PC   vancomycin  125 mg Oral QID   Followed by   Derrill Memo ON 07/03/2020] vancomycin  125 mg Oral BID   Followed by   Derrill Memo ON 07/11/2020] vancomycin  125 mg Oral Daily   Followed by   Derrill Memo ON 07/18/2020] vancomycin  125 mg Oral QODAY   Followed by   Derrill Memo ON 07/26/2020] vancomycin  125 mg Oral Q3 days   Infusions:   heparin 2,100 Units/hr  (06/22/20 1704)    Assessment: 25 yoM with PMH UC, HTN, currently being treated for Cdiff, admitted 11/29 for persistent diarrhea, including bloody diarrhea. On 12/2 LE swelling noted and venous duplex shows acute RLE DVT. Pharmacy to dose IV heparin.   Baseline INR, aPTT: not done  Prior anticoagulation: none  Today, 06/23/2020:  HL 0.43, therapeutic on IV heparin at 2100 units/hr  CBC: Hgb low at 6.7 - order to transfuse PRBC x1  No overt bleeding/no observed blood in stool or infusion issues per RN  Scr stable WNL  Goal of Therapy: Heparin level 0.3-0.7 units/ml Monitor platelets by anticoagulation protocol: Yes  Plan:  Continue Heparin at 2100 units/hr   Recheck heparin level in 8hrs   Daily CBC, daily heparin level while on heparin  Monitor for signs of bleeding or thrombosis  Netta Cedars, PharmD, BCPS 06/23/2020 2:11 AM

## 2020-06-23 NOTE — Progress Notes (Signed)
PROGRESS NOTE    Justin Rosales  XIP:[id number] DOB: 1951/03/02 DOA: 06/18/2020 PCP: Shelda Pal, DO   Brief Narrative:   Justin Rosales a 69 y.o.malewith medical history significant ofUC, HTN. Pt with diarrhea for past couple of weeks, diagnosed with C. Diff based on GI pathogen pnl on 11/16. Treatment with PO vanc started 11/19. Diarrhea initially bloody, now brown. Diarrhea has persisted since starting treatment. Continues to have intermittent fevers. BPs running on low side despite not taking BP meds.  12/4: Hgb drop overnight. 1 unit pRBCs ordered. No obvious bleed. Continuing heparin for now. If he continues to drop, we may need to talk about IVC filter. He says he still has diarrhea, but it is slowing.   Assessment & Plan:  Sepsis secondary to C.Diff colitis, acute recurrent, POA, sepsis physiology is improved     - Patient met sepsis criteria on admission as he was febrile with a temperature of 101.4, tachycardic at a rate of 106, had a leukocytosis of 12.6 and a source of infection with C. Difficile on Admission     - CT Abd/Pelvis showed "Left colon is diffusely distended and featureless with mild circumferential wall thickening starting the distal transverse colon through the splenic flexure down to the rectum mild associated pericolonic edema/inflammation. Imaging features are compatible with infectious/inflammatory colitis. Bilateral renal cysts. Aortic Atherosclerosis"     - D is slowing; continue vanc/flagyl, fiber, probiotic for now     - Follow vanc taper as written by GI  Normocytic Anemia     - Acute Blood Loss Anemia and Chronic Anemia of Chronic Dsease     - Baseline appears to be around 11; hemoglobin dropped to 7.9 a few days ago and dropped to 6.7 last night. He got 1 unit pRBCs; remains on heparin gtt; no new blood in his stools, he says that has stopped completely     - he has received iron sucrose 500 mg IV x1     - transfuse for Hgb less than  7.0     - if he continues to drop, we will need to stop heparin and place an IVC  Colonic Ileus     - KUB 06/20/20 showed "Progressive gaseous distension of the colon suggestive of worsening colonic ileus. Signs of bowel wall edema involving the left colon compatible with colitis."     - continue ambulation     - tolerating diet  Thrombocytosis      - likely reactive in the setting of above  Hyponatremia     - Mild on admission at 128 and now improved slowly     - cycling in the low 130's; no Sx, follow  HTN,Essential     - BP is acceptable, currentlyholding home BP meds  UC     - he has longstanding left-sided ulcerative colitis and was in clinical endoscopic remission per GI until a few weeks ago     - continue sulfasalazine  Obesity     - BMI 30.40; outpt follow up  Acute right leg posterior tibial DVT     - Initially thought to be in setting of poor nutritional status as his albumin is 1.9      - venous duplex showed an acute right posterior tibial DVT     - Started the patient on a heparin drip; hgb dropped to 6.7 this morning; received 1 u pRBCS, no overt bleeding     - for now continue with heparin gtt w/ plan to  transition to eliquis; if he continues to drop, will likely consult IR for IVC filter  DVT prophylaxis: heparin gtt Code Status: FULL Family Communication: with wife by phone   Status is: Inpatient  Remains inpatient appropriate because:Inpatient level of care appropriate due to severity of illness   Dispo: The patient is from: Home              Anticipated d/c is to: Home              Anticipated d/c date is: 3 days              Patient currently is not medically stable to d/c.  Consultants:   GI  Antimicrobials:  . Vanc, flagyl   ROS:  Denies CP, palpitations, dyspnea, N/V, rectal bleeding. Remainder ROS is negative for all not previously mentioned.  Subjective: "It hasn't stopped, but it's slowing."  Objective: Vitals:   06/23/20  1505 06/23/20 1544 06/23/20 1611 06/23/20 1626  BP: 120/60 125/61 129/62 131/66  Pulse: 100 (!) 105 (!) 108 (!) 104  Resp: 17 18  17   Temp: 98.5 F (36.9 C) 98.6 F (37 C) 99.5 F (37.5 C) 98.6 F (37 C)  TempSrc: Oral Oral Oral Oral  SpO2: 98% 94% 91% 92%  Weight:      Height:        Intake/Output Summary (Last 24 hours) at 06/23/2020 1701 Last data filed at 06/23/2020 1454 Gross per 24 hour  Intake 515 ml  Output 2 ml  Net 513 ml   Filed Weights   06/18/20 1146 06/18/20 1757  Weight: 99.8 kg 96.1 kg    Examination:  General: 70 y.o. male resting in bed in NAD Eyes: PERRL, normal sclera ENMT: Nares patent w/o discharge, orophaynx clear, dentition normal, ears w/o discharge/lesions/ulcers Neck: Supple, trachea midline Cardiovascular: RRR, +S1, S2, no m/g/r, equal pulses throughout Respiratory: CTABL, no w/r/r, normal WOB GI: BS+, NDNT, no masses noted, no organomegaly noted MSK: No e/c/c Skin: No rashes, bruises, ulcerations noted Neuro: A&O x 3, no focal deficits Psyc: Appropriate interaction and affect, calm/cooperative   Data Reviewed: I have personally reviewed following labs and imaging studies.  CBC: Recent Labs  Lab 06/20/20 0650 06/21/20 0620 06/22/20 0600 06/22/20 1240 06/23/20 0116  WBC 9.0 10.4 12.2* 16.6* 14.6*  NEUTROABS  --  6.5 8.4* 11.7* 9.5*  HGB 7.6* 7.6* 7.1* 7.3* 6.7*  HCT 24.1* 25.0* 23.6* 24.1* 21.8*  MCV 86.4 87.1 86.1 86.7 86.2  PLT 417* 432* 440* 498* 967*   Basic Metabolic Panel: Recent Labs  Lab 06/19/20 0724 06/20/20 0650 06/21/20 0620 06/22/20 0600 06/23/20 0116  NA 131* 130* 132* 132* 130*  K 4.6 4.5 4.2 3.8 3.8  CL 99 98 101 103 100  CO2 22 24 23 22 23   GLUCOSE 107* 149* 126* 107* 108*  BUN 14 12 9 10 8   CREATININE 0.97 0.94 0.94 0.91 0.86  CALCIUM 7.7* 7.4* 7.5* 7.5* 7.4*  MG  --   --  1.9 1.7 1.7  PHOS  --   --  3.1 3.0 2.8   GFR: Estimated Creatinine Clearance: 94.3 mL/min (by C-G formula based on SCr of  0.86 mg/dL). Liver Function Tests: Recent Labs  Lab 06/18/20 1158 06/20/20 0650 06/21/20 0620 06/22/20 0600 06/23/20 0116  AST 51* 20 15 18  14*  ALT 54* 34 25 23 18   ALKPHOS 57 45 43 39 40  BILITOT 0.2* 0.5 0.4 0.4 0.5  PROT 5.9* 4.8* 4.8* 4.4* 4.4*  ALBUMIN 2.0* 1.9* 1.9* 1.8* 1.7*   Recent Labs  Lab 06/18/20 1158  LIPASE 23   No results for input(s): AMMONIA in the last 168 hours. Coagulation Profile: No results for input(s): INR, PROTIME in the last 168 hours. Cardiac Enzymes: No results for input(s): CKTOTAL, CKMB, CKMBINDEX, TROPONINI in the last 168 hours. BNP (last 3 results) No results for input(s): PROBNP in the last 8760 hours. HbA1C: No results for input(s): HGBA1C in the last 72 hours. CBG: No results for input(s): GLUCAP in the last 168 hours. Lipid Profile: No results for input(s): CHOL, HDL, LDLCALC, TRIG, CHOLHDL, LDLDIRECT in the last 72 hours. Thyroid Function Tests: No results for input(s): TSH, T4TOTAL, FREET4, T3FREE, THYROIDAB in the last 72 hours. Anemia Panel: No results for input(s): VITAMINB12, FOLATE, FERRITIN, TIBC, IRON, RETICCTPCT in the last 72 hours. Sepsis Labs: Recent Labs  Lab 06/18/20 1338  LATICACIDVEN 0.9    Recent Results (from the past 240 hour(s))  Blood culture (routine x 2)     Status: None   Collection Time: 06/18/20  1:30 PM   Specimen: BLOOD  Result Value Ref Range Status   Specimen Description   Final    BLOOD RIGHT ANTECUBITAL Performed at [facility], [place] [address] [place], [place] [address]    Special Requests   Final    BOTTLES DRAWN AEROBIC AND ANAEROBIC Blood Culture adequate volume Performed at [facility], [address] [place], [place] [address]    Culture   Final    NO GROWTH 5 DAYS Performed at [facility], [place] [address], [place], [place] [address]    Report Status 06/23/2020 FINAL  Final  Resp Panel by RT-PCR (Flu A&B, Covid) Nasopharyngeal Swab     Status: None    Collection Time: 06/18/20  1:38 PM   Specimen: Nasopharyngeal Swab; Nasopharyngeal(NP) swabs in vial transport medium  Result Value Ref Range Status   SARS Coronavirus 2 by RT PCR NEGATIVE NEGATIVE Final    Comment: (NOTE) SARS-CoV-2 target nucleic acids are NOT DETECTED.  The SARS-CoV-2 RNA is generally detectable in upper respiratory specimens during the acute phase of infection. The lowest concentration of SARS-CoV-2 viral copies this assay can detect is 138 copies/mL. A negative result does not preclude SARS-Cov-2 infection and should not be used as the sole basis for treatment or other patient management decisions. A negative result may occur with  improper specimen collection/handling, submission of specimen other than nasopharyngeal swab, presence of viral mutation(s) within the areas targeted by this assay, and inadequate number of viral copies(<138 copies/mL). A negative result must be combined with clinical observations, patient history, and epidemiological information. The expected result is Negative.  Fact Sheet for Patients:  EntrepreneurPulse.com.au  Fact Sheet for Healthcare Providers:  IncredibleEmployment.be  This test is no t yet approved or cleared by the [place] FDA and  has been authorized for detection and/or diagnosis of SARS-CoV-2 by FDA under an Emergency Use Authorization (EUA). This EUA will remain  in effect (meaning this test can be used) for the duration of the COVID-19 declaration under Section 564(b)(1) of the Act, 21 U.S.C.section 360bbb-3(b)(1), unless the authorization is terminated  or revoked sooner.       Influenza A by PCR NEGATIVE NEGATIVE Final   Influenza B by PCR NEGATIVE NEGATIVE Final    Comment: (NOTE) The Xpert Xpress SARS-CoV-2/FLU/RSV plus assay is intended as an aid in the diagnosis of influenza from Nasopharyngeal swab specimens and  should not be used as a sole basis for treatment.  Nasal washings and aspirates are unacceptable for Xpert Xpress SARS-CoV-2/FLU/RSV testing.  Fact Sheet for Patients: EntrepreneurPulse.com.au  Fact Sheet for Healthcare Providers: IncredibleEmployment.be  This test is not yet approved or cleared by the [place] FDA and has been authorized for detection and/or diagnosis of SARS-CoV-2 by FDA under an Emergency Use Authorization (EUA). This EUA will remain in effect (meaning this test can be used) for the duration of the COVID-19 declaration under Section 564(b)(1) of the Act, 21 U.S.C. section 360bbb-3(b)(1), unless the authorization is terminated or revoked.  Performed at [facility], [address] [place], [place] [address]   Blood culture (routine x 2)     Status: None   Collection Time: 06/18/20  1:40 PM   Specimen: BLOOD RIGHT FOREARM  Result Value Ref Range Status   Specimen Description   Final    BLOOD RIGHT FOREARM Performed at [facility], [address] [place], [place] [address]    Special Requests   Final    BOTTLES DRAWN AEROBIC AND ANAEROBIC Blood Culture adequate volume Performed at [facility], [address] [place], [place] [address]    Culture   Final    NO GROWTH 5 DAYS Performed at [facility], [place] [address], [place], [place] [address]    Report Status 06/23/2020 FINAL  Final      Radiology Studies: No results found.   Scheduled Meds: . calcium carbonate  1 tablet Oral BID WC  . feeding supplement (KATE FARMS STANDARD 1.4)  325 mL Oral BID BM  . fiber  1 packet Oral BID  . metroNIDAZOLE  500 mg Oral Q8H  . multivitamin with minerals  1 tablet Oral Daily  . saccharomyces boulardii  250 mg Oral BID  . sulfaSALAzine  1,000 mg Oral TID PC  . vancomycin  125 mg Oral QID   Followed by  . [START ON 07/03/2020] vancomycin  125 mg Oral BID   Followed by  . [START ON 07/11/2020] vancomycin  125 mg Oral Daily    Followed by  . [START ON 07/18/2020] vancomycin  125 mg Oral QODAY   Followed by  . [START ON 07/26/2020] vancomycin  125 mg Oral Q3 days   Continuous Infusions: . heparin 2,100 Units/hr (06/22/20 1704)     LOS: 5 days    Time spent: 25 minutes spent in the coordination of care today.    Jonnie Finner, DO [facility]  If 7PM-7AM, please contact night-coverage [email or web address] 06/23/2020, 5:01 PM

## 2020-06-23 NOTE — Progress Notes (Signed)
CRITICAL VALUE ALERT  Critical Value:  Hemoglobin 6.7  Date & Time Notied:  06/23/20 0208  Provider Notified: Kennon Holter, NP  Orders Received/Actions taken: Transfuse x2 unit pRBCs

## 2020-06-24 DIAGNOSIS — A0472 Enterocolitis due to Clostridium difficile, not specified as recurrent: Secondary | ICD-10-CM | POA: Diagnosis not present

## 2020-06-24 LAB — COMPREHENSIVE METABOLIC PANEL
ALT: 18 U/L (ref 0–44)
AST: 21 U/L (ref 15–41)
Albumin: 2 g/dL — ABNORMAL LOW (ref 3.5–5.0)
Alkaline Phosphatase: 41 U/L (ref 38–126)
Anion gap: 10 (ref 5–15)
BUN: 9 mg/dL (ref 8–23)
CO2: 20 mmol/L — ABNORMAL LOW (ref 22–32)
Calcium: 7.7 mg/dL — ABNORMAL LOW (ref 8.9–10.3)
Chloride: 102 mmol/L (ref 98–111)
Creatinine, Ser: 1.02 mg/dL (ref 0.61–1.24)
GFR, Estimated: >60 mL/min (ref >60)
Glucose, Bld: 100 mg/dL — ABNORMAL HIGH (ref 70–99)
Potassium: 4 mmol/L (ref 3.5–5.1)
Sodium: 132 mmol/L — ABNORMAL LOW (ref 135–145)
Total Bilirubin: 0.5 mg/dL (ref 0.3–1.2)
Total Protein: 4.8 g/dL — ABNORMAL LOW (ref 6.5–8.1)

## 2020-06-24 LAB — BPAM RBC
Blood Product Expiration Date: 202201042359
Blood Product Expiration Date: 202201042359
ISSUE DATE / TIME: 202112041151
ISSUE DATE / TIME: 202112041604
Unit Type and Rh: 5100
Unit Type and Rh: 5100

## 2020-06-24 LAB — TYPE AND SCREEN
ABO/RH(D): O POS
Antibody Screen: NEGATIVE
Unit division: 0
Unit division: 0

## 2020-06-24 LAB — CBC
HCT: 31.5 % — ABNORMAL LOW (ref 39.0–52.0)
Hemoglobin: 9.6 g/dL — ABNORMAL LOW (ref 13.0–17.0)
MCH: 26.7 pg (ref 26.0–34.0)
MCHC: 30.5 g/dL (ref 30.0–36.0)
MCV: 87.5 fL (ref 80.0–100.0)
Platelets: 453 10*3/uL — ABNORMAL HIGH (ref 150–400)
RBC: 3.6 MIL/uL — ABNORMAL LOW (ref 4.22–5.81)
RDW: 16.3 % — ABNORMAL HIGH (ref 11.5–15.5)
WBC: 13.9 10*3/uL — ABNORMAL HIGH (ref 4.0–10.5)
nRBC: 1.9 % — ABNORMAL HIGH (ref 0.0–0.2)

## 2020-06-24 LAB — HEPARIN LEVEL (UNFRACTIONATED): Heparin Unfractionated: 0.4 IU/mL (ref 0.30–0.70)

## 2020-06-24 LAB — MAGNESIUM: Magnesium: 2 mg/dL (ref 1.7–2.4)

## 2020-06-24 NOTE — Progress Notes (Signed)
[facility] for IV heparin Indication: new acute DVT  No Known Allergies  Patient Measurements: Height: 5' 10"  (177.8 cm) Weight: 96.1 kg (211 lb 13.8 oz) IBW/kg (Calculated) : 73 Heparin Dosing Weight: 92 kg   Vital Signs: Temp: 97.5 F (36.4 C) (12/05 0618) Temp Source: Oral (12/05 0618) BP: 130/62 (12/05 0618) Pulse Rate: 102 (12/05 0618)  Labs: Recent Labs    06/22/20 0600 06/22/20 0600 06/22/20 1240 06/22/20 1432 06/23/20 0116 06/23/20 0738 06/24/20 0706  HGB 7.1*   < > 7.3*   < > 6.7*  --  9.6*  HCT 23.6*   < > 24.1*  --  21.8*  --  31.5*  PLT 440*   < > 498*  --  434*  --  453*  HEPARINUNFRC 0.22*  --   --    < > 0.43 0.53 0.40  CREATININE 0.91  --   --   --  0.86  --  1.02   < > = values in this interval not displayed.    Estimated Creatinine Clearance: 79.5 mL/min (by C-G formula based on SCr of 1.02 mg/dL).   Medical History: Past Medical History:  Diagnosis Date  . Anemia   . Arthritis   . Elevated cholesterol   . Hypertension   . UC (ulcerative colitis) ([place])     Assessment: 52 yoM with PMH UC, HTN, and anemia currently being treated for Cdiff, admitted 11/29 for persistent diarrhea, including bloody diarrhea. On 12/2 LE swelling noted and venous duplex shows acute RLE DVT. Pharmacy to dose IV heparin.   Baseline INR, aPTT: not done  Prior anticoagulation: none  Significant Events:  -12/4: Transfused 1 unit PRBC for Hgb <7. No signs of bleeding  Today, 06/24/2020:  HL = 0.40 remains therapeutic on heparin infusion of 2100 units/hr  CBC: Hgb (9.6) improved s/p transfusion. Plt remain elevated  Confirmed with RN that heparin infusing at correct rate. No signs of bleeding.   Goal of Therapy: Heparin level 0.3-0.7 units/ml Monitor platelets by anticoagulation protocol: Yes  Plan:  Continue heparin at current rate of 2100 units/hr   Daily CBC, daily heparin level while on heparin  Monitor for  signs of bleeding   Follow for eventual transition to PO anticoagulation  Lenis Noon, PharmD 06/24/20 9:11 AM

## 2020-06-24 NOTE — Progress Notes (Signed)
PROGRESS NOTE    Danile Trier  CLE:[id number] DOB: 1950/12/26 DOA: 06/18/2020 PCP: Shelda Pal, DO   Brief Narrative:   Jakyren Fluegge a 69 y.o.malewith medical history significant ofUC, HTN. Pt with diarrhea for past couple of weeks, diagnosed with C. Diff based on GI pathogen pnl on 11/16. Treatment with PO vanc started 11/19. Diarrhea initially bloody, now brown. Diarrhea has persisted since starting treatment. Continues to have intermittent fevers. BPs running on low side despite not taking BP meds.  12/5: Diarrhea still not stopped. Continue flagyl/vanc. Hgb is stable today. Plan was to heparin challenge over the weekend. Should be able to transition to [facility] if Hgb remains stable tomorrow.   Assessment & Plan: Sepsis secondary to C.Diff colitis, acute recurrent, POA, sepsis physiology is improved     - Patient met sepsis criteria on admission as he was febrile with a temperature of 101.4, tachycardic at a rate of 106, had a leukocytosis of 12.6 and a source of infection with C. Difficile on Admission     - CT Abd/Pelvis showed "Left colon is diffusely distended and featureless with mild circumferential wall thickening starting the distal transverse colon through the splenic flexure down to the rectum mild associated pericolonic edema/inflammation. Imaging features are compatible with infectious/inflammatory colitis. Bilateral renal cysts. Aortic Atherosclerosis"     - 12/5: D still has not stopped; continue flagyl, fiber, probiotics, vanc taper as prescribed by GI  Normocytic Anemia     - Acute Blood Loss Anemia and Chronic Anemia of Chronic Dsease     - Baseline appears to be around 11; hemoglobin dropped to 7.9 a few days ago and dropped to 6.7 12/4. He got 1 unit pRBCs; remains on heparin gtt; no new blood in his stools, he says that has stopped completely     - he has received iron sucrose 500 mg IV x1     - transfuse for Hgb less than 7.0     - 12/5: Hgb is stable  today, he remains on heparin gtt; if he remains stable tomorrow, can consider for transition to [facility]  Colonic Ileus     - KUB 06/20/20 showed "Progressive gaseous distension of the colon suggestive of worsening colonic ileus. Signs of bowel wall edema involving the left colon compatible with colitis."     - continue ambulation     - tolerating diet  Thrombocytosis      - likely reactive in the setting of above  Hyponatremia     - Mild on admission at 128 and now improved slowly     - cycling in the low 130's; no Sx, follow  HTN,Essential     - BP is ok; currently holding BP meds  UC     - he has longstanding left-sided ulcerative colitis and was in clinical endoscopic remission per GI until a few weeks ago     - continue sulfasalazine  Obesity     - BMI 30.40; outpt follow up  Acute right leg posterior tibial DVT     - Initially thought to be in setting of poor nutritional status as his albumin is 1.9      - venous duplex showed an acute right posterior tibial DVT     - Started the patient on a heparin drip; hgb dropped to 6.7 12/4; received 1 u pRBCS, no overt bleeding     - 12/5: Hgb is stable after transfusion; if it remains stable, consider transition to [facility] in AM  DVT  prophylaxis: heparin gtt Code Status: FULL Family Communication: None at bedside   Status is: Inpatient  Remains inpatient appropriate because:Inpatient level of care appropriate due to severity of illness   Dispo: The patient is from: Home              Anticipated d/c is to: Home              Anticipated d/c date is: 2 days              Patient currently is not medically stable to d/c.  Consultants:   GI  Antimicrobials:  . Vanc, flagyl   ROS:  Reports D. Denies F/N/V/CP/palpitations . Remainder ROS is negative for all not previously mentioned.  Subjective: "You've given me good news."  Objective: Vitals:   06/23/20 1842 06/23/20 2153 06/24/20 0618 06/24/20 1523  BP: 136/63 127/67  130/62 120/77  Pulse: 92 94 (!) 102 (!) 105  Resp: _0 Temp: 98.3 F (36.8 C) 99.4 F (37.4 C) (!) 97.5 F (36.4 C) (!) 97.5 F (36.4 C)  TempSrc: Oral Oral Oral Oral  SpO2: 98% 97% 99% 95%  Weight:      Height:        Intake/Output Summary (Last 24 hours) at 06/24/2020 1551 Last data filed at 06/24/2020 0000 Gross per 24 hour  Intake 909.88 ml  Output --  Net 909.88 ml   Filed Weights   06/18/20 1146 06/18/20 1757  Weight: 99.8 kg 96.1 kg    Examination:  General: 69 y.o. male resting in bed in NAD Eyes: PERRL, normal sclera ENMT: Nares patent w/o discharge, orophaynx clear, dentition normal, ears w/o discharge/lesions/ulcers Neck: Supple, trachea midline Cardiovascular: RRR, +S1, S2, no m/g/r, equal pulses throughout Respiratory: CTABL, no w/r/r, normal WOB GI: BS+, NDNT, no masses noted, no organomegaly noted MSK: No e/c/c Skin: No rashes, bruises, ulcerations noted Neuro: A&O x 3, no focal deficits Psyc: Appropriate interaction and affect, calm/cooperative   Data Reviewed: I have personally reviewed following labs and imaging studies.  CBC: Recent Labs  Lab 06/21/20 0620 06/22/20 0600 06/22/20 1240 06/23/20 0116 06/24/20 0706  WBC 10.4 12.2* 16.6* 14.6* 13.9*  NEUTROABS 6.5 8.4* 11.7* 9.5*  --   HGB 7.6* 7.1* 7.3* 6.7* 9.6*  HCT 25.0* 23.6* 24.1* 21.8* 31.5*  MCV 87.1 86.1 86.7 86.2 87.5  PLT 432* 440* 498* 434* 170*   Basic Metabolic Panel: Recent Labs  Lab 06/20/20 0650 06/21/20 0620 06/22/20 0600 06/23/20 0116 06/24/20 0706  NA 130* 132* 132* 130* 132*  K 4.5 4.2 3.8 3.8 4.0  CL 98 101 103 100 102  CO2 _1 20*  GLUCOSE 149* 126* 107* 108* 100*  BUN _2 CREATININE 0.94 0.94 0.91 0.86 1.02  CALCIUM 7.4* 7.5* 7.5* 7.4* 7.7*  MG  --  1.9 1.7 1.7 2.0  PHOS  --  3.1 3.0 2.8  --    GFR: Estimated Creatinine Clearance: 79.5 mL/min (by C-G formula based on SCr of 1.02 mg/dL). Liver Function Tests: Recent Labs  Lab  06/20/20 0650 06/21/20 0620 06/22/20 0600 06/23/20 0116 06/24/20 0706  AST _3 14* 21  ALT 34 _4 ALKPHOS 45 43 39 40 41  BILITOT 0.5 0.4 0.4 0.5 0.5  PROT 4.8* 4.8* 4.4* 4.4* 4.8*  ALBUMIN 1.9* 1.9* 1.8* 1.7* 2.0*   Recent Labs  Lab 06/18/20 1158  LIPASE 23   No results for input(s):  AMMONIA in the last 168 hours. Coagulation Profile: No results for input(s): INR, PROTIME in the last 168 hours. Cardiac Enzymes: No results for input(s): CKTOTAL, CKMB, CKMBINDEX, TROPONINI in the last 168 hours. BNP (last 3 results) No results for input(s): PROBNP in the last 8760 hours. HbA1C: No results for input(s): HGBA1C in the last 72 hours. CBG: No results for input(s): GLUCAP in the last 168 hours. Lipid Profile: No results for input(s): CHOL, HDL, LDLCALC, TRIG, CHOLHDL, LDLDIRECT in the last 72 hours. Thyroid Function Tests: No results for input(s): TSH, T4TOTAL, FREET4, T3FREE, THYROIDAB in the last 72 hours. Anemia Panel: No results for input(s): VITAMINB12, FOLATE, FERRITIN, TIBC, IRON, RETICCTPCT in the last 72 hours. Sepsis Labs: Recent Labs  Lab 06/18/20 1338  LATICACIDVEN 0.9    Recent Results (from the past 240 hour(s))  Blood culture (routine x 2)     Status: None   Collection Time: 06/18/20  1:30 PM   Specimen: BLOOD  Result Value Ref Range Status   Specimen Description   Final    BLOOD RIGHT ANTECUBITAL Performed at [facility], [place] [address] [place], [place] [address]    Special Requests   Final    BOTTLES DRAWN AEROBIC AND ANAEROBIC Blood Culture adequate volume Performed at [facility], [address] [place], [place] [address]    Culture   Final    NO GROWTH 5 DAYS Performed at [facility], [place] [address], [place], [place] [address]    Report Status 06/23/2020 FINAL  Final  Resp Panel by RT-PCR (Flu A&B, Covid) Nasopharyngeal Swab     Status: None   Collection Time: 06/18/20  1:38 PM   Specimen:  Nasopharyngeal Swab; Nasopharyngeal(NP) swabs in vial transport medium  Result Value Ref Range Status   SARS Coronavirus 2 by RT PCR NEGATIVE NEGATIVE Final    Comment: (NOTE) SARS-CoV-2 target nucleic acids are NOT DETECTED.  The SARS-CoV-2 RNA is generally detectable in upper respiratory specimens during the acute phase of infection. The lowest concentration of SARS-CoV-2 viral copies this assay can detect is 138 copies/mL. A negative result does not preclude SARS-Cov-2 infection and should not be used as the sole basis for treatment or other patient management decisions. A negative result may occur with  improper specimen collection/handling, submission of specimen other than nasopharyngeal swab, presence of viral mutation(s) within the areas targeted by this assay, and inadequate number of viral copies(<138 copies/mL). A negative result must be combined with clinical observations, patient history, and epidemiological information. The expected result is Negative.  Fact Sheet for Patients:  EntrepreneurPulse.com.au  Fact Sheet for Healthcare Providers:  IncredibleEmployment.be  This test is no t yet approved or cleared by the [place] FDA and  has been authorized for detection and/or diagnosis of SARS-CoV-2 by FDA under an Emergency Use Authorization (EUA). This EUA will remain  in effect (meaning this test can be used) for the duration of the COVID-19 declaration under Section 564(b)(1) of the Act, 21 U.S.C.section 360bbb-3(b)(1), unless the authorization is terminated  or revoked sooner.       Influenza A by PCR NEGATIVE NEGATIVE Final   Influenza B by PCR NEGATIVE NEGATIVE Final    Comment: (NOTE) The Xpert Xpress SARS-CoV-2/FLU/RSV plus assay is intended as an aid in the diagnosis of influenza from Nasopharyngeal swab specimens and should not be used as a sole basis for treatment. Nasal washings and aspirates are unacceptable for  Xpert Xpress SARS-CoV-2/FLU/RSV testing.  Fact Sheet for Patients: EntrepreneurPulse.com.au  Fact Sheet for Healthcare Providers: IncredibleEmployment.be  This test is not yet approved or cleared by the [place] FDA and has been authorized for detection and/or diagnosis of SARS-CoV-2 by FDA under an Emergency Use Authorization (EUA). This EUA will remain in effect (meaning this test can be used) for the duration of the COVID-19 declaration under Section 564(b)(1) of the Act, 21 U.S.C. section 360bbb-3(b)(1), unless the authorization is terminated or revoked.  Performed at [facility], [address] [place], [place] [address]   Blood culture (routine x 2)     Status: None   Collection Time: 06/18/20  1:40 PM   Specimen: BLOOD RIGHT FOREARM  Result Value Ref Range Status   Specimen Description   Final    BLOOD RIGHT FOREARM Performed at [facility], [address] [place], [place] [address]    Special Requests   Final    BOTTLES DRAWN AEROBIC AND ANAEROBIC Blood Culture adequate volume Performed at [facility], [address] [place], [place] [address]    Culture   Final    NO GROWTH 5 DAYS Performed at [facility], [place] [address] [place], [place] [address]    Report Status 06/23/2020 FINAL  Final      Radiology Studies: No results found.   Scheduled Meds: . calcium carbonate  1 tablet Oral BID WC  . feeding supplement (KATE FARMS STANDARD 1.4)  325 mL Oral BID BM  . fiber  1 packet Oral BID  . metroNIDAZOLE  500 mg Oral Q8H  . multivitamin with minerals  1 tablet Oral Daily  . saccharomyces boulardii  250 mg Oral BID  . sulfaSALAzine  1,000 mg Oral TID PC  . vancomycin  125 mg Oral QID   Followed by  . [START ON 07/03/2020] vancomycin  125 mg Oral BID   Followed by  . [START ON 07/11/2020] vancomycin  125 mg Oral Daily   Followed by  . [START ON 07/18/2020] vancomycin  125  mg Oral QODAY   Followed by  . [START ON 07/26/2020] vancomycin  125 mg Oral Q3 days   Continuous Infusions: . heparin 2,100 Units/hr (06/24/20 0640)     LOS: 6 days    Time spent: 25 minutes spent in the coordination of care today.    Jonnie Finner, DO [facility]  If 7PM-7AM, please contact night-coverage [email or web address] 06/24/2020, 3:51 PM

## 2020-06-25 ENCOUNTER — Ambulatory Visit: Payer: Medicare Other | Admitting: Gastroenterology

## 2020-06-25 ENCOUNTER — Inpatient Hospital Stay (HOSPITAL_COMMUNITY): Payer: Medicare Other

## 2020-06-25 DIAGNOSIS — M7989 Other specified soft tissue disorders: Secondary | ICD-10-CM | POA: Diagnosis not present

## 2020-06-25 DIAGNOSIS — E871 Hypo-osmolality and hyponatremia: Secondary | ICD-10-CM

## 2020-06-25 DIAGNOSIS — I82409 Acute embolism and thrombosis of unspecified deep veins of unspecified lower extremity: Secondary | ICD-10-CM

## 2020-06-25 DIAGNOSIS — K51019 Ulcerative (chronic) pancolitis with unspecified complications: Secondary | ICD-10-CM | POA: Diagnosis not present

## 2020-06-25 DIAGNOSIS — R140 Abdominal distension (gaseous): Secondary | ICD-10-CM | POA: Insufficient documentation

## 2020-06-25 DIAGNOSIS — A0472 Enterocolitis due to Clostridium difficile, not specified as recurrent: Secondary | ICD-10-CM | POA: Diagnosis not present

## 2020-06-25 DIAGNOSIS — K567 Ileus, unspecified: Secondary | ICD-10-CM

## 2020-06-25 LAB — CBC
HCT: 30.5 % — ABNORMAL LOW (ref 39.0–52.0)
Hemoglobin: 9.4 g/dL — ABNORMAL LOW (ref 13.0–17.0)
MCH: 27 pg (ref 26.0–34.0)
MCHC: 30.8 g/dL (ref 30.0–36.0)
MCV: 87.6 fL (ref 80.0–100.0)
Platelets: 398 10*3/uL (ref 150–400)
RBC: 3.48 MIL/uL — ABNORMAL LOW (ref 4.22–5.81)
RDW: 17.5 % — ABNORMAL HIGH (ref 11.5–15.5)
WBC: 14.7 10*3/uL — ABNORMAL HIGH (ref 4.0–10.5)
nRBC: 0.6 % — ABNORMAL HIGH (ref 0.0–0.2)

## 2020-06-25 LAB — HEPARIN LEVEL (UNFRACTIONATED): Heparin Unfractionated: 0.61 IU/mL (ref 0.30–0.70)

## 2020-06-25 MED ORDER — ENSURE MAX PROTEIN PO LIQD
11.0000 [oz_av] | Freq: Two times a day (BID) | ORAL | Status: DC
  Administered 2020-06-26 – 2020-06-27 (×2): 11 [oz_av] via ORAL
  Filled 2020-06-25 (×5): qty 330

## 2020-06-25 MED ORDER — CHOLESTYRAMINE 4 G PO PACK
4.0000 g | PACK | Freq: Every day | ORAL | Status: DC
  Administered 2020-06-25 – 2020-06-26 (×2): 4 g via ORAL
  Filled 2020-06-25 (×3): qty 1

## 2020-06-25 NOTE — Progress Notes (Signed)
PROGRESS NOTE    Justin Rosales   WUJ:[id number]  DOB: 1951-06-28  DOA: 06/18/2020     7  PCP: Shelda Pal, DO  CC: diarrhea  Hospital Course: [place] a 69 y.o.malewith medical history significant ofUC, HTN. Pt with diarrhea for past couple of weeks, diagnosed with C. Diff based on GI pathogen pnl on 11/16. Treatment with PO vanc started 11/19. Diarrhea initially bloody, now brown. Diarrhea has persisted since starting treatment. Continues to have intermittent fevers. BPs running on low side despite not taking BP meds. After addition of flagyl to oral vanc regimen he seems to have improved some slowly.  Stool consistency remains in the same as liquid but frequency seems to have overall improved since on admission.  GI was consulted on admission as well.  Interval History:  Sitting up in chair when seen today and wife bedside.  Multitude of questions answered and addressed to the best of my ability.  No further bloody stools but still having significant diarrhea he states.  When further asked, it does appear that the frequency has indeed overall improved since admission as well as his fever curve.  Denies any abdominal pain or vomiting.  Still has some mild nausea which he attributes to Flagyl.  Old records reviewed in assessment of this patient  ROS: Constitutional: negative for chills and fevers, Respiratory: negative for cough, Cardiovascular: negative for chest pain and Gastrointestinal: negative for abdominal pain  Assessment & Plan: Sepsis secondary to C.Diff colitis, acute recurrent, POA, sepsis physiology is improved - Patient met sepsis criteria on admission as he was febrile with a temperature of 101.4, tachycardic at a rate of 106, had a leukocytosis of 12.6 and a source of infection with C. Difficile on Admission  - CT Abd/Pelvis showed "Left colon is diffusely distended and featureless with mild circumferential wall thickening starting the distal  transverse colon through the splenic flexure down to the rectum mild associated pericolonic edema/inflammation. Imaging features are compatible with infectious/inflammatory colitis. Bilateral renal cysts. Aortic Atherosclerosis" - re-engaged GI on 12/6. Stopped fiber, trial of questran - continue vanc PO for 6 week course and flagyl for 14 day course - continue monitoring stools; likely would become electrolyte deficient at discharge until stools improve  Normocytic Anemia - Acute Blood Loss Anemia and Chronic Anemia of Chronic Dsease - Baseline appears to be around 11; hemoglobin dropped to 7.9 a few days ago and dropped to 6.7 12/4. He got 1 unit pRBCs; remains on heparin gtt; no new blood in his stools, he says that has stopped completely - he has received iron sucrose 500 mg IV x1 - transfuse for Hgb less than 7 - continue heparin gtt for 1 more day for Hgb stability; if stable on 12/7, will transition to Eliquis  Acute right leg posterior tibial DVT - Initially thought to be in setting of poor nutritional status as his albumin is 1.9  - venous duplex showed an acute right posterior tibial DVT - etiology considered immobility at home as patient not active for several weeks now with diarrhea - continue  Heparin drip until 12/7; if Hgb stable then Eliquis  Colonic Ileus - KUB 06/20/20 showed "Progressive gaseous distension of the colon suggestive of worsening colonic ileus. Signs of bowel wall edema involving the left colon compatible with colitis." - continue ambulation - tolerating diet - xray on 12/7 to follow up   Thrombocytosis  - likely reactive in the setting of above  Hyponatremia - Mild on admission at  128 and now improved slowly - cycling in the low 130's; no Sx, follow  HTN,Essential - BP is ok; currently holding BP meds  UC - he has longstanding left-sided ulcerative colitis and was in clinical  endoscopic remission per GI until a few weeks ago - continue sulfasalazine  Obesity - BMI 30.40; outpt follow up  Antimicrobials: Oral vanc Flayl  DVT prophylaxis: heparin drip Code Status: Full Family Communication: Wife bedside Disposition Plan: Status is: Inpatient  Remains inpatient appropriate because:Unsafe d/c plan, IV treatments appropriate due to intensity of illness or inability to take PO and Inpatient level of care appropriate due to severity of illness   Dispo: The patient is from: Home              Anticipated d/c is to: Home              Anticipated d/c date is: 3 days              Patient currently is not medically stable to d/c.  Objective: Blood pressure 125/70, pulse 94, temperature 98.1 F (36.7 C), temperature source Oral, resp. rate 15, height 5' 10"  (1.778 m), weight 96.1 kg, SpO2 97 %.  Examination: General appearance: Pleasant well-developed adult man sitting up in chair in no distress Head: Normocephalic, without obvious abnormality, atraumatic Eyes: EOMI Lungs: clear to auscultation bilaterally Heart: regular rate and rhythm and S1, S2 normal Abdomen: Mildly distended but baseline, soft, nontender, bowel sounds present Extremities: Bilateral lower extremity pitting edema, approximately 1-2+.  Nontender lower extremities.  1+ edema also noted in right upper extremity Skin: mobility and turgor normal Neurologic: Grossly normal  Consultants:   GI  Procedures:     Data Reviewed: I have personally reviewed following labs and imaging studies Results for orders placed or performed during the hospital encounter of 06/18/20 (from the past 24 hour(s))  CBC     Status: Abnormal   Collection Time: 06/25/20  5:45 AM  Result Value Ref Range   WBC 14.7 (H) 4.0 - 10.5 K/uL   RBC 3.48 (L) 4.22 - 5.81 MIL/uL   Hemoglobin 9.4 (L) 13.0 - 17.0 g/dL   HCT 30.5 (L) 39 - 52 %   MCV 87.6 80.0 - 100.0 fL   MCH 27.0 26.0 - 34.0 pg   MCHC 30.8 30.0 -  36.0 g/dL   RDW 17.5 (H) 11.5 - 15.5 %   Platelets 398 150 - 400 K/uL   nRBC 0.6 (H) 0.0 - 0.2 %  Heparin level (unfractionated)     Status: None   Collection Time: 06/25/20  5:45 AM  Result Value Ref Range   Heparin Unfractionated 0.61 0.30 - 0.70 IU/mL    Recent Results (from the past 240 hour(s))  Blood culture (routine x 2)     Status: None   Collection Time: 06/18/20  1:30 PM   Specimen: BLOOD  Result Value Ref Range Status   Specimen Description   Final    BLOOD RIGHT ANTECUBITAL Performed at [facility], [address], [place], [place] [address]    Special Requests   Final    BOTTLES DRAWN AEROBIC AND ANAEROBIC Blood Culture adequate volume Performed at [facility], [address] [place], [place] [address]    Culture   Final    NO GROWTH 5 DAYS Performed at [facility], [place] [address] [place], [place] [address]    Report Status 06/23/2020 FINAL  Final  Resp Panel  by RT-PCR (Flu A&B, Covid) Nasopharyngeal Swab     Status: None   Collection Time: 06/18/20  1:38 PM   Specimen: Nasopharyngeal Swab; Nasopharyngeal(NP) swabs in vial transport medium  Result Value Ref Range Status   SARS Coronavirus 2 by RT PCR NEGATIVE NEGATIVE Final    Comment: (NOTE) SARS-CoV-2 target nucleic acids are NOT DETECTED.  The SARS-CoV-2 RNA is generally detectable in upper respiratory specimens during the acute phase of infection. The lowest concentration of SARS-CoV-2 viral copies this assay can detect is 138 copies/mL. A negative result does not preclude SARS-Cov-2 infection and should not be used as the sole basis for treatment or other patient management decisions. A negative result may occur with  improper specimen collection/handling, submission of specimen other than nasopharyngeal swab, presence of viral mutation(s) within the areas targeted by this assay, and inadequate number of viral copies(<138 copies/mL). A negative result must be combined  with clinical observations, patient history, and epidemiological information. The expected result is Negative.  Fact Sheet for Patients:  EntrepreneurPulse.com.au  Fact Sheet for Healthcare Providers:  IncredibleEmployment.be  This test is no t yet approved or cleared by the [place] FDA and  has been authorized for detection and/or diagnosis of SARS-CoV-2 by FDA under an Emergency Use Authorization (EUA). This EUA will remain  in effect (meaning this test can be used) for the duration of the COVID-19 declaration under Section 564(b)(1) of the Act, 21 U.S.C.section 360bbb-3(b)(1), unless the authorization is terminated  or revoked sooner.       Influenza A by PCR NEGATIVE NEGATIVE Final   Influenza B by PCR NEGATIVE NEGATIVE Final    Comment: (NOTE) The Xpert Xpress SARS-CoV-2/FLU/RSV plus assay is intended as an aid in the diagnosis of influenza from Nasopharyngeal swab specimens and should not be used as a sole basis for treatment. Nasal washings and aspirates are unacceptable for Xpert Xpress SARS-CoV-2/FLU/RSV testing.  Fact Sheet for Patients: EntrepreneurPulse.com.au  Fact Sheet for Healthcare Providers: IncredibleEmployment.be  This test is not yet approved or cleared by the [place] FDA and has been authorized for detection and/or diagnosis of SARS-CoV-2 by FDA under an Emergency Use Authorization (EUA). This EUA will remain in effect (meaning this test can be used) for the duration of the COVID-19 declaration under Section 564(b)(1) of the Act, 21 U.S.C. section 360bbb-3(b)(1), unless the authorization is terminated or revoked.  Performed at [facility], [address] [place], [place] [address]   Blood culture (routine x 2)     Status: None   Collection Time: 06/18/20  1:40 PM   Specimen: BLOOD RIGHT FOREARM  Result Value Ref Range Status   Specimen Description    Final    BLOOD RIGHT FOREARM Performed at [facility], [address] [place], [place] [address]    Special Requests   Final    BOTTLES DRAWN AEROBIC AND ANAEROBIC Blood Culture adequate volume Performed at [facility], [address] [place], [place] [address]    Culture   Final    NO GROWTH 5 DAYS Performed at [facility], [place] [address] [place], [place] [address]    Report Status 06/23/2020 FINAL  Final     Radiology Studies: VAS [place] UPPER EXTREMITY VENOUS DUPLEX  Result Date: 06/25/2020 UPPER VENOUS STUDY  Indications: Swelling Comparison Study: no prior Performing Technologist: Abram Sander RVS  Examination Guidelines: A complete evaluation includes B-mode imaging, spectral Doppler, color Doppler, and power  Doppler as needed of all accessible portions of each vessel. Bilateral testing is considered an integral part of a complete examination. Limited examinations for reoccurring indications may be performed as noted.  Right Findings: +----------+------------+---------+-----------+----------+-------+ RIGHT     CompressiblePhasicitySpontaneousPropertiesSummary +----------+------------+---------+-----------+----------+-------+ IJV           Full       Yes       Yes                      +----------+------------+---------+-----------+----------+-------+ Subclavian    Full       Yes       Yes                      +----------+------------+---------+-----------+----------+-------+ Axillary      Full       Yes       Yes                      +----------+------------+---------+-----------+----------+-------+ Brachial      Full       Yes       Yes                      +----------+------------+---------+-----------+----------+-------+ Radial        Full                                          +----------+------------+---------+-----------+----------+-------+ Ulnar         Full                                           +----------+------------+---------+-----------+----------+-------+ Cephalic      Full                                          +----------+------------+---------+-----------+----------+-------+ Basilic       Full                                          +----------+------------+---------+-----------+----------+-------+  Summary:  Right: No evidence of deep vein thrombosis in the upper extremity. No evidence of superficial vein thrombosis in the upper extremity. No evidence of thrombosis in the subclavian.  *See table(s) above for measurements and observations.    Preliminary    VAS [place] UPPER EXTREMITY VENOUS DUPLEX      VAS [place] LOWER EXTREMITY VENOUS (DVT)  Final Result    DG Abd 1 View  Final Result    DG Abd 2 Views  Final Result    CT ABDOMEN PELVIS W CONTRAST  Final Result      Scheduled Meds: . calcium carbonate  1 tablet Oral BID WC  . metroNIDAZOLE  500 mg Oral Q8H  . multivitamin with minerals  1 tablet Oral Daily  . Ensure Max Protein  11 oz Oral BID  . saccharomyces boulardii  250 mg Oral BID  . sulfaSALAzine  1,000 mg Oral TID PC  . vancomycin  125 mg Oral QID   Followed by  . [START ON 07/03/2020] vancomycin  125 mg  Oral BID   Followed by  . [START ON 07/11/2020] vancomycin  125 mg Oral Daily   Followed by  . [START ON 07/18/2020] vancomycin  125 mg Oral QODAY   Followed by  . [START ON 07/26/2020] vancomycin  125 mg Oral Q3 days   PRN Meds: acetaminophen **OR** acetaminophen, ondansetron **OR** ondansetron (ZOFRAN) IV Continuous Infusions: . heparin 2,100 Units/hr (06/25/20 0307)     LOS: 7 days  Time spent: Greater than 50% of the 35 minute visit was spent in counseling/coordination of care for the patient as laid out in the A&P.   Dwyane Dee, MD [facility] 06/25/2020, 5:00 PM

## 2020-06-25 NOTE — Progress Notes (Signed)
[place] for IV heparin Indication: new acute DVT  No Known Allergies  Patient Measurements: Height: 5' 10"  (177.8 cm) Weight: 96.1 kg (211 lb 13.8 oz) IBW/kg (Calculated) : 73 Heparin Dosing Weight: 92 kg   Vital Signs: Temp: 97.6 F (36.4 C) (12/06 0547) Temp Source: Oral (12/06 0547) BP: 147/67 (12/06 0547) Pulse Rate: 96 (12/06 0547)  Labs: Recent Labs    06/23/20 0116 06/23/20 0116 06/23/20 0738 06/24/20 0706 06/25/20 0545  HGB 6.7*   < >  --  9.6* 9.4*  HCT 21.8*  --   --  31.5* 30.5*  PLT 434*  --   --  453* 398  HEPARINUNFRC 0.43   < > 0.53 0.40 0.61  CREATININE 0.86  --   --  1.02  --    < > = values in this interval not displayed.    Estimated Creatinine Clearance: 79.5 mL/min (by C-G formula based on SCr of 1.02 mg/dL).   Medical History: Past Medical History:  Diagnosis Date  . Anemia   . Arthritis   . Elevated cholesterol   . Hypertension   . UC (ulcerative colitis) ([place])     Assessment: 59 yoM with PMH UC, HTN, and anemia currently being treated for Cdiff, admitted 11/29 for persistent diarrhea, including bloody diarrhea. On 12/2 LE swelling noted and venous duplex shows acute RLE DVT. Pharmacy to dose IV heparin.   Baseline INR, aPTT: not done  Prior anticoagulation: none  Significant Events:  -12/4: Transfused 1 unit PRBC for Hgb <7. No signs of bleeding  Today, 06/25/2020:  HL = 0.61 remains therapeutic on heparin infusion of 2100 units/hr  CBC: Hgb (9.4) remains low but stable s/p transfusion. Plt remain elevated, trending down  Confirmed with RN that heparin infusing at correct rate. No signs of bleeding.   Goal of Therapy: Heparin level 0.3-0.7 units/ml Monitor platelets by anticoagulation protocol: Yes  Plan:  Continue heparin infusion at current rate of 2100 units/hr   Daily CBC, daily heparin level while on heparin  Monitor for signs of bleeding   Follow for eventual transition  to PO anticoagulation  Lenis Noon, PharmD 06/25/20 10:37 AM

## 2020-06-25 NOTE — Assessment & Plan Note (Addendum)
-   Mild on admission at 128 and now improved slowly -Has steadily improved as diarrhea continues to improve

## 2020-06-25 NOTE — Progress Notes (Signed)
Upper extremity venous has been completed.   Preliminary results in CV Proc.   Justin Rosales 06/25/2020 2:44 PM

## 2020-06-25 NOTE — Hospital Course (Addendum)
Justin Rosales is a 69 y.o. male with medical history significant of UC, HTN.  Pt with diarrhea for past couple of weeks, diagnosed with C. Diff based on GI pathogen pnl on 11/16.  Treatment with PO vanc started 11/19. Diarrhea initially bloody, now brown. Diarrhea has persisted since starting treatment.  Continued to have intermittent fevers.  BPs running on low side despite not taking BP meds. After addition of flagyl to oral vanc regimen he seems to have improved some slowly.   GI was consulted on admission as well. He started to show improvement after addition of Flagyl with oral vancomycin regimen.  He was also started on Questran and stools continue to decrease in frequency with slight increase in bulk. At discharge he was continued on a 14-day course total of Flagyl and prolonged vancomycin taper over 6 weeks.  He also had swelling in his lower extremities, notably his right lower extremity on admission.  He had been relatively immobile for several weeks at home due to his severe diarrhea and decreased energy.  He underwent venous duplex on admission and was found to have a right posterior tibial DVT.  Due to anemia and reports of bloody stools, he was started on a heparin drip.  After hemoglobin showed stability, he was transitioned to Eliquis.  This was continued at discharge.

## 2020-06-25 NOTE — Assessment & Plan Note (Addendum)
-   Acute Blood Loss Anemia and Chronic Anemia of Chronic Dsease - Baseline appears to be around 11; Hgb dropped to 6.712/4. He got 1 unit pRBCs - he has received iron sucrose 500 mg IV x1 - transfuse for Hgb less than 7 -Hemoglobin remaining stable.  DC heparin drip and start Eliquis on 06/26/2020

## 2020-06-25 NOTE — Assessment & Plan Note (Addendum)
-   he has longstanding left-sided ulcerative colitis - continue sulfasalazine

## 2020-06-25 NOTE — Assessment & Plan Note (Addendum)
-  Patient met sepsis criteria on admission as he was febrile with a temperature of 101.4, tachycardic at a rate of 106, had a leukocytosis of 12.6 and a source of infection with C. Difficile on Admission  - re-engaged GI on 12/6. Stopped fiber, trial of questran - continue vanc PO for 6 week course and flagyl for 14 day course -Diarrhea slowly improved prior to discharge.  Was having approximately 2 stools per 12-hour period at discharge - outpatient follow up with GI

## 2020-06-25 NOTE — Assessment & Plan Note (Addendum)
-   Initially thought to be in setting of poor nutritional status as his albumin is 1.9  - venous duplex 12/2 showed an acute right posterior tibial DVT - etiology considered immobility at home as patient not active for several weeks now with diarrhea - continue Heparin drip until 12/7 -Eliquis continued at discharge.  Will need hemoglobin check at follow-up

## 2020-06-25 NOTE — Progress Notes (Addendum)
Patient ID: Justin Rosales, male   DOB: March 29, 1951, 69 y.o.   MRN: [id number]    Progress Note   Subjective   Day # 8  CC: C. diff colitis superimposed on Ulcerative colitis   Asked by hospitalist to re- evaluate  Vancomycin 125 mg QID  Flagyl 500 q 8 Florastor Azulfidine  Labs- today - WBC 14.7. hgb 9.4  Still feeling fatigued, no pain or cramping, no bleeding. Stool are liquid - now having about 6 BM's per 24 hours as opposed to 10-12 per day on admit. Pt and wife with multiple questions   Objective   Vital signs in last 24 hours: Temp:  [97.5 F (36.4 C)-98.1 F (36.7 C)] 98.1 F (36.7 C) (12/06 1340) Pulse Rate:  [92-105] 94 (12/06 1340) Resp:  [15-16] 15 (12/06 1340) BP: (120-147)/(65-77) 125/70 (12/06 1340) SpO2:  [95 %-97 %] 97 % (12/06 1340) Last BM Date: 06/24/20 General:  Older   white male  in NAD Heart:  Regular rate and rhythm; no murmurs Lungs: Respirations even and unlabored, lungs CTA bilaterally Abdomen:  Soft and nontender. Mildly distended. Periumbilical hernia, easily reduced. Normal bowel sounds. Extremities:  Without edema. Neurologic:  Alert and oriented,  grossly normal neurologically. Psych:  Cooperative. Normal mood and affect.  Intake/Output from previous day: 12/05 0701 - 12/06 0700 In: 1306.4 [P.O.:680; I.V.:626.4] Out: -  Intake/Output this shift: No intake/output data recorded.  Lab Results: Recent Labs    06/23/20 0116 06/24/20 0706 06/25/20 0545  WBC 14.6* 13.9* 14.7*  HGB 6.7* 9.6* 9.4*  HCT 21.8* 31.5* 30.5*  PLT 434* 453* 398   BMET Recent Labs    06/23/20 0116 06/24/20 0706  NA 130* 132*  K 3.8 4.0  CL 100 102  CO2 23 20*  GLUCOSE 108* 100*  BUN 8 9  CREATININE 0.86 1.02  CALCIUM 7.4* 7.7*   LFT Recent Labs    06/24/20 0706  PROT 4.8*  ALBUMIN 2.0*  AST 21  ALT 18  ALKPHOS 41  BILITOT 0.5     Assessment / Plan:     #1 69 yo male with hx of Ulcerative colitis which had been in endoscopic remission on  Azulfidine with new dx of C. diff as oupt about 2 weeks ago - admitted after failure to improve on outpt vancomycin  Flagyl  added to regimen here and gradually improving last week with plan for long tapered course of vancomycin x 6 weeks, and 14 day course of Flagyl.  He continues to have gradual improvement.  #2 anemia-Iron studies most consistent with anemia of chronic disease- stable post transfusions  #3 HTN  Plan:  Continue current Regimen of Vancomycin and Flagyl  Continue florastor BID Stop Fiber  Consider adding Questran once daily - will see how he does over the next few days  Pt would like to be followed outpt at [facility] office, and asking to switch to Dr Rush Landmark for follow up (frustration with lack of lab etc at [facility] office)  Will continue to follow for now.    LOS: 7 days   Amy EsterwoodPA-C  06/25/2020, 2:17 PM     Attending Physician Note   I have taken an interval history, reviewed the chart and examined the patient. I agree with the Advanced Practitioner's note, impression and recommendations.   C diff diarrhea with underlying ulcerative colitis which was in remission, maintained on sulfasalazine. He is steadily improving on his current regimen of vancomycin, metronidazole and Florastor. Continue as planned with  a full pulse taper vancomycin regimen. Flagyl as planned for 14 days. Florastor for at least 2 months. DC fiber. Add Questran qd however must be separate from antibiotic and probiotic doses. Persistent colonic distention on imaging studies with abdominal distention noted today. We will follow with you for now.   Lucio Edward, MD [facility]

## 2020-06-25 NOTE — Assessment & Plan Note (Signed)
-   see sepsis

## 2020-06-25 NOTE — Care Management Important Message (Signed)
Important Message  Patient Details IM Letter given to the Patient. Name: Justin Rosales MRN: [id number] Date of Birth: [birth year]   Medicare Important Message Given:  Yes     Kerin Salen 06/25/2020, 11:30 AM

## 2020-06-25 NOTE — Assessment & Plan Note (Addendum)
-  Home meds resumed at discharge

## 2020-06-25 NOTE — Assessment & Plan Note (Addendum)
-  Abdominal x-ray on 06/21/2020 showed persistent colonic distention consistent with colitis and/or ileus however he is eating well and having bowel movements -Repeat abdominal x-ray on 06/26/2020 does appear to show improvement

## 2020-06-26 ENCOUNTER — Inpatient Hospital Stay (HOSPITAL_COMMUNITY): Payer: Medicare Other

## 2020-06-26 DIAGNOSIS — K51019 Ulcerative (chronic) pancolitis with unspecified complications: Secondary | ICD-10-CM | POA: Diagnosis not present

## 2020-06-26 DIAGNOSIS — A0472 Enterocolitis due to Clostridium difficile, not specified as recurrent: Secondary | ICD-10-CM | POA: Diagnosis not present

## 2020-06-26 DIAGNOSIS — I82441 Acute embolism and thrombosis of right tibial vein: Secondary | ICD-10-CM

## 2020-06-26 DIAGNOSIS — A419 Sepsis, unspecified organism: Secondary | ICD-10-CM | POA: Diagnosis not present

## 2020-06-26 LAB — BASIC METABOLIC PANEL
Anion gap: 9 (ref 5–15)
BUN: 11 mg/dL (ref 8–23)
CO2: 23 mmol/L (ref 22–32)
Calcium: 7.5 mg/dL — ABNORMAL LOW (ref 8.9–10.3)
Chloride: 102 mmol/L (ref 98–111)
Creatinine, Ser: 0.92 mg/dL (ref 0.61–1.24)
GFR, Estimated: >60 mL/min (ref >60)
Glucose, Bld: 122 mg/dL — ABNORMAL HIGH (ref 70–99)
Potassium: 3.7 mmol/L (ref 3.5–5.1)
Sodium: 134 mmol/L — ABNORMAL LOW (ref 135–145)

## 2020-06-26 LAB — CBC WITH DIFFERENTIAL/PLATELET
Abs Immature Granulocytes: 0.22 10*3/uL — ABNORMAL HIGH (ref 0.00–0.07)
Basophils Absolute: 0.1 10*3/uL (ref 0.0–0.1)
Basophils Relative: 1 %
Eosinophils Absolute: 0.2 10*3/uL (ref 0.0–0.5)
Eosinophils Relative: 2 %
HCT: 30.1 % — ABNORMAL LOW (ref 39.0–52.0)
Hemoglobin: 9.3 g/dL — ABNORMAL LOW (ref 13.0–17.0)
Immature Granulocytes: 2 %
Lymphocytes Relative: 24 %
Lymphs Abs: 2.5 10*3/uL (ref 0.7–4.0)
MCH: 26.9 pg (ref 26.0–34.0)
MCHC: 30.9 g/dL (ref 30.0–36.0)
MCV: 87 fL (ref 80.0–100.0)
Monocytes Absolute: 1.2 10*3/uL — ABNORMAL HIGH (ref 0.1–1.0)
Monocytes Relative: 11 %
Neutro Abs: 6.2 10*3/uL (ref 1.7–7.7)
Neutrophils Relative %: 60 %
Platelets: 330 10*3/uL (ref 150–400)
RBC: 3.46 MIL/uL — ABNORMAL LOW (ref 4.22–5.81)
RDW: 18.2 % — ABNORMAL HIGH (ref 11.5–15.5)
WBC: 10.3 10*3/uL (ref 4.0–10.5)
nRBC: 0.2 % (ref 0.0–0.2)

## 2020-06-26 LAB — MAGNESIUM: Magnesium: 1.8 mg/dL (ref 1.7–2.4)

## 2020-06-26 LAB — C-REACTIVE PROTEIN: CRP: 2 mg/dL — ABNORMAL HIGH (ref <1.0)

## 2020-06-26 LAB — HEPARIN LEVEL (UNFRACTIONATED): Heparin Unfractionated: 0.53 IU/mL (ref 0.30–0.70)

## 2020-06-26 MED ORDER — APIXABAN 5 MG PO TABS
10.0000 mg | ORAL_TABLET | Freq: Two times a day (BID) | ORAL | Status: DC
  Administered 2020-06-26 – 2020-06-27 (×3): 10 mg via ORAL
  Filled 2020-06-26: qty 4
  Filled 2020-06-26 (×2): qty 2

## 2020-06-26 MED ORDER — APIXABAN 5 MG PO TABS: 5.0000 mg | ORAL_TABLET | Freq: Two times a day (BID) | ORAL | Status: DC

## 2020-06-26 NOTE — Progress Notes (Signed)
Nutrition Follow-up  DOCUMENTATION CODES:   Obesity unspecified  INTERVENTION:   -D/c Dillard [place]  -Ensure MAX Protein po BID, each supplement provides 150 kcal and 30 grams of protein -Daily snack  NUTRITION DIAGNOSIS:   Increased nutrient needs related to acute illness as evidenced by estimated needs.  Ongoing.  GOAL:   Patient will meet greater than or equal to 90% of their needs  Progressing.  MONITOR:   PO intake, Supplement acceptance, Labs, Weight trends  ASSESSMENT:   69 y.o. male with medical history of UC, HTN, arthritis, and anemia. He presented to the ED due to diarrhea for several weeks, diagnosed with C. Diff based on GI pathogen on 11/16. He continues to have intermittent fevers.  Patient did not like his Dillard Essex supplements per MD, so RD ordered Ensure Max as pt has drank Premier Protein at home.   Pt is currently consuming 25-75% of meals. Pt's stools are improving but still loose.   Admission weight: 211 lbs. Now new weights measured.  Medications: Tums, Multivitamin with minerals daily, Florastor, IV Zofran  Labs reviewed: Low Na  Diet Order:   Diet Order            Diet regular Room service appropriate? Yes; Fluid consistency: Thin  Diet effective now                 EDUCATION NEEDS:   No education needs have been identified at this time  Skin:  Skin Assessment: Reviewed RN Assessment  Last BM:  12/1 -type 7  Height:   Ht Readings from Last 1 Encounters:  06/18/20 5' 10"  (1.778 m)    Weight:   Wt Readings from Last 1 Encounters:  06/18/20 96.1 kg   BMI:  Body mass index is 30.4 kg/m.  Estimated Nutritional Needs:   Kcal:  2100-2300 kcal  Protein:  100-115 grams  Fluid:  >/= 2.5 L/day  Clayton Bibles, MS, RD, LDN Inpatient Clinical Dietitian Contact information available via Amion

## 2020-06-26 NOTE — Progress Notes (Signed)
PROGRESS NOTE    Justin Rosales   KYH:[id number]  DOB: May 28, 1951  DOA: 06/18/2020     8  PCP: Shelda Pal, DO  CC: diarrhea  Hospital Course: Justin Rosales is a 69 y.o. male with medical history significant of UC, HTN.  Pt with diarrhea for past couple of weeks, diagnosed with C. Diff based on GI pathogen pnl on 11/16.  Treatment with PO vanc started 11/19. Diarrhea initially bloody, now brown. Diarrhea has persisted since starting treatment.  Continues to have intermittent fevers.  BPs running on low side despite not taking BP meds. After addition of flagyl to oral vanc regimen he seems to have improved some slowly.  Stool consistency remains in the same as liquid but frequency seems to have overall improved since on admission.  GI was consulted on admission as well.  Other work-up included imaging is lower extremity due to worsening swelling while he was home.  He was found to have a right posterior tibial DVT and was started on a heparin drip initially which was ultimately transitioned to Eliquis.  He also had swelling of his right upper extremity and underwent venous duplex as well which was negative for DVT.   Interval History:  Bowel movements have improved since yesterday.  Down to approximately 3 liquidy stools over a 12-hour period.  Denies any nausea, vomiting, abdominal pain.  We discussed possibly discharging home tomorrow continued improvement.  Eliquis being started today as hemoglobin is stable.  Wife updated over the phone while in room.  Old records reviewed in assessment of this patient  ROS: Constitutional: negative for chills and fevers, Respiratory: negative for cough, Cardiovascular: negative for chest pain and Gastrointestinal: negative for abdominal pain  Assessment & Plan: * Sepsis ([employer]) - Patient met sepsis criteria on admission as he was febrile with a temperature of 101.4, tachycardic at a rate of 106, had a leukocytosis of 12.6 and a source of  infection with C. Difficile on Admission  - re-engaged GI on 12/6. Stopped fiber, trial of questran - continue vanc PO for 6 week course and flagyl for 14 day course - continue monitoring stools; likely would become electrolyte deficient at discharge until stools improve -Stools continuing to improve, hopeful for discharge possibly tomorrow  DVT (deep venous thrombosis) (HCC) - Initially thought to be in setting of poor nutritional status as his albumin is 1.9  - venous duplex 12/2 showed an acute right posterior tibial DVT - etiology considered immobility at home as patient not active for several weeks now with diarrhea - continue Heparin drip until 12/7; if Hgb stable then Eliquis  C. difficile colitis - see sepsis  Ileus ([place]) -Abdominal x-ray on 06/21/2020 showed persistent colonic distention consistent with colitis and/or ileus however he is eating well and having bowel movements -Repeat abdominal x-ray on 06/26/2020 does appear to show improvement  Anemia - Acute Blood Loss Anemia and Chronic Anemia of Chronic Dsease - Baseline appears to be around 11; Hgb dropped to 6.712/4. He got 1 unit pRBCs - he has received iron sucrose 500 mg IV x1 - transfuse for Hgb less than 7 -Hemoglobin remaining stable.  DC heparin drip and start Eliquis on 06/26/2020  Ulcerative colitis with complication ([place]) - he has longstanding left-sided ulcerative colitis - continue sulfasalazine  Hyponatremia - Mild on admission at 128 and now improved slowly -Has steadily improved as diarrhea continues to improve  Essential hypertension - continue holding BP meds    Antimicrobials: Oral vanc Flayl  DVT prophylaxis: Eliquis Code Status: Full Family Communication: Wife over the phone while in room Disposition Plan: Status is: Inpatient  Remains inpatient appropriate because:Persistent severe electrolyte disturbances, IV treatments appropriate due to intensity of illness or inability to take PO  and Inpatient level of care appropriate due to severity of illness   Dispo: The patient is from: Home              Anticipated d/c is to: Home              Anticipated d/c date is: 1 day              Patient currently is not medically stable to d/c.  Objective: Blood pressure 124/62, pulse 92, temperature 97.6 F (36.4 C), temperature source Oral, resp. rate 15, height 5' 10"  (1.778 m), weight 96.1 kg, SpO2 99 %.  Examination: General appearance: Pleasant well-developed adult man sitting in chair in NAD Head: Normocephalic, without obvious abnormality, atraumatic Eyes: EOMI Lungs: clear to auscultation bilaterally Heart: regular rate and rhythm and S1, S2 normal Abdomen: Mildly distended but baseline, soft, nontender, bowel sounds present Extremities: Bilateral lower extremity pitting edema, approximately 2+.  Nontender lower extremities.  1+ edema also noted in right upper extremity Skin: mobility and turgor normal Neurologic: Grossly normal  Consultants:   GI  Procedures:     Data Reviewed: I have personally reviewed following labs and imaging studies Results for orders placed or performed during the hospital encounter of 06/18/20 (from the past 24 hour(s))  Heparin level (unfractionated)     Status: None   Collection Time: 06/26/20  6:37 AM  Result Value Ref Range   Heparin Unfractionated 0.53 0.30 - 0.70 IU/mL  C-reactive protein     Status: Abnormal   Collection Time: 06/26/20  6:37 AM  Result Value Ref Range   CRP 2.0 (H) <1.0 mg/dL  Basic metabolic panel     Status: Abnormal   Collection Time: 06/26/20  6:37 AM  Result Value Ref Range   Sodium 134 (L) 135 - 145 mmol/L   Potassium 3.7 3.5 - 5.1 mmol/L   Chloride 102 98 - 111 mmol/L   CO2 23 22 - 32 mmol/L   Glucose, Bld 122 (H) 70 - 99 mg/dL   BUN 11 8 - 23 mg/dL   Creatinine, Ser 0.92 0.61 - 1.24 mg/dL   Calcium 7.5 (L) 8.9 - 10.3 mg/dL   GFR, Estimated >60 >60 mL/min   Anion gap 9 5 - 15  CBC with  Differential/Platelet     Status: Abnormal   Collection Time: 06/26/20  6:37 AM  Result Value Ref Range   WBC 10.3 4.0 - 10.5 K/uL   RBC 3.46 (L) 4.22 - 5.81 MIL/uL   Hemoglobin 9.3 (L) 13.0 - 17.0 g/dL   HCT 30.1 (L) 39 - 52 %   MCV 87.0 80.0 - 100.0 fL   MCH 26.9 26.0 - 34.0 pg   MCHC 30.9 30.0 - 36.0 g/dL   RDW 18.2 (H) 11.5 - 15.5 %   Platelets 330 150 - 400 K/uL   nRBC 0.2 0.0 - 0.2 %   Neutrophils Relative % 60 %   Neutro Abs 6.2 1.7 - 7.7 K/uL   Lymphocytes Relative 24 %   Lymphs Abs 2.5 0.7 - 4.0 K/uL   Monocytes Relative 11 %   Monocytes Absolute 1.2 (H) 0.1 - 1.0 K/uL   Eosinophils Relative 2 %   Eosinophils Absolute 0.2 0.0 - 0.5 K/uL  Basophils Relative 1 %   Basophils Absolute 0.1 0.0 - 0.1 K/uL   WBC Morphology DOHLE BODIES    Immature Granulocytes 2 %   Abs Immature Granulocytes 0.22 (H) 0.00 - 0.07 K/uL  Magnesium     Status: None   Collection Time: 06/26/20  6:37 AM  Result Value Ref Range   Magnesium 1.8 1.7 - 2.4 mg/dL    Recent Results (from the past 240 hour(s))  Blood culture (routine x 2)     Status: None   Collection Time: 06/18/20  1:30 PM   Specimen: BLOOD  Result Value Ref Range Status   Specimen Description   Final    BLOOD RIGHT ANTECUBITAL Performed at [facility], [place] [address] [place], [place] [address]    Special Requests   Final    BOTTLES DRAWN AEROBIC AND ANAEROBIC Blood Culture adequate volume Performed at [facility], [address] [place], [place] [address]    Culture   Final    NO GROWTH 5 DAYS Performed at [facility], [place] [address], [place], [place] [address]    Report Status 06/23/2020 FINAL  Final  Resp Panel by RT-PCR (Flu A&B, Covid) Nasopharyngeal Swab     Status: None   Collection Time: 06/18/20  1:38 PM   Specimen: Nasopharyngeal Swab; Nasopharyngeal(NP) swabs in vial transport medium  Result Value Ref Range Status   SARS Coronavirus 2 by RT PCR NEGATIVE NEGATIVE Final    Comment:  (NOTE) SARS-CoV-2 target nucleic acids are NOT DETECTED.  The SARS-CoV-2 RNA is generally detectable in upper respiratory specimens during the acute phase of infection. The lowest concentration of SARS-CoV-2 viral copies this assay can detect is 138 copies/mL. A negative result does not preclude SARS-Cov-2 infection and should not be used as the sole basis for treatment or other patient management decisions. A negative result may occur with  improper specimen collection/handling, submission of specimen other than nasopharyngeal swab, presence of viral mutation(s) within the areas targeted by this assay, and inadequate number of viral copies(<138 copies/mL). A negative result must be combined with clinical observations, patient history, and epidemiological information. The expected result is Negative.  Fact Sheet for Patients:  EntrepreneurPulse.com.au  Fact Sheet for Healthcare Providers:  IncredibleEmployment.be  This test is no t yet approved or cleared by the [place] FDA and  has been authorized for detection and/or diagnosis of SARS-CoV-2 by FDA under an Emergency Use Authorization (EUA). This EUA will remain  in effect (meaning this test can be used) for the duration of the COVID-19 declaration under Section 564(b)(1) of the Act, 21 U.S.C.section 360bbb-3(b)(1), unless the authorization is terminated  or revoked sooner.       Influenza A by PCR NEGATIVE NEGATIVE Final   Influenza B by PCR NEGATIVE NEGATIVE Final    Comment: (NOTE) The Xpert Xpress SARS-CoV-2/FLU/RSV plus assay is intended as an aid in the diagnosis of influenza from Nasopharyngeal swab specimens and should not be used as a sole basis for treatment. Nasal washings and aspirates are unacceptable for Xpert Xpress SARS-CoV-2/FLU/RSV testing.  Fact Sheet for Patients: EntrepreneurPulse.com.au  Fact Sheet for Healthcare  Providers: IncredibleEmployment.be  This test is not yet approved or cleared by the [place] FDA and has been authorized for detection and/or diagnosis of SARS-CoV-2 by FDA under an Emergency Use Authorization (EUA). This EUA will remain in effect (meaning this test can be used) for the duration of the COVID-19 declaration under Section 564(b)(1)  of the Act, 21 U.S.C. section 360bbb-3(b)(1), unless the authorization is terminated or revoked.  Performed at [facility], [address] [place], [place] [address]   Blood culture (routine x 2)     Status: None   Collection Time: 06/18/20  1:40 PM   Specimen: BLOOD RIGHT FOREARM  Result Value Ref Range Status   Specimen Description   Final    BLOOD RIGHT FOREARM Performed at [facility], [address] [place], [place] [address]    Special Requests   Final    BOTTLES DRAWN AEROBIC AND ANAEROBIC Blood Culture adequate volume Performed at [facility], [address] [place], [place] [address]    Culture   Final    NO GROWTH 5 DAYS Performed at [facility], [place] [address], [place], [place] [address]    Report Status 06/23/2020 FINAL  Final     Radiology Studies: VAS [place] UPPER EXTREMITY VENOUS DUPLEX  Result Date: 06/25/2020 UPPER VENOUS STUDY  Indications: Swelling Comparison Study: no prior Performing Technologist: Abram Sander RVS  Examination Guidelines: A complete evaluation includes B-mode imaging, spectral Doppler, color Doppler, and power Doppler as needed of all accessible portions of each vessel. Bilateral testing is considered an integral part of a complete examination. Limited examinations for reoccurring indications may be performed as noted.  Right Findings: +----------+------------+---------+-----------+----------+-------+ RIGHT     CompressiblePhasicitySpontaneousPropertiesSummary +----------+------------+---------+-----------+----------+-------+ IJV            Full       Yes       Yes                      +----------+------------+---------+-----------+----------+-------+ Subclavian    Full       Yes       Yes                      +----------+------------+---------+-----------+----------+-------+ Axillary      Full       Yes       Yes                      +----------+------------+---------+-----------+----------+-------+ Brachial      Full       Yes       Yes                      +----------+------------+---------+-----------+----------+-------+ Radial        Full                                          +----------+------------+---------+-----------+----------+-------+ Ulnar         Full                                          +----------+------------+---------+-----------+----------+-------+ Cephalic      Full                                          +----------+------------+---------+-----------+----------+-------+ Basilic       Full                                          +----------+------------+---------+-----------+----------+-------+  Summary:  Right: No evidence of deep vein thrombosis in the upper extremity. No evidence of superficial vein thrombosis in the upper extremity. No evidence of thrombosis in the subclavian.  *See table(s) above for measurements and observations.  Diagnosing physician: Ruta Hinds MD Electronically signed by Ruta Hinds MD on 06/25/2020 at 5:01:51 PM.    Final    VAS [place] UPPER EXTREMITY VENOUS DUPLEX  Final Result    VAS [place] LOWER EXTREMITY VENOUS (DVT)  Final Result    DG Abd 1 View  Final Result    DG Abd 2 Views  Final Result    CT ABDOMEN PELVIS W CONTRAST  Final Result    DG Abd 2 Views    (Results Pending)    Scheduled Meds: . apixaban  10 mg Oral BID   Followed by  . [START ON 07/03/2020] apixaban  5 mg Oral BID  . calcium carbonate  1 tablet Oral BID WC  . cholestyramine  4 g Oral Daily  . metroNIDAZOLE  500 mg Oral Q8H  .  multivitamin with minerals  1 tablet Oral Daily  . Ensure Max Protein  11 oz Oral BID  . saccharomyces boulardii  250 mg Oral BID  . sulfaSALAzine  1,000 mg Oral TID PC  . vancomycin  125 mg Oral QID   Followed by  . [START ON 07/03/2020] vancomycin  125 mg Oral BID   Followed by  . [START ON 07/11/2020] vancomycin  125 mg Oral Daily   Followed by  . [START ON 07/18/2020] vancomycin  125 mg Oral QODAY   Followed by  . [START ON 07/26/2020] vancomycin  125 mg Oral Q3 days   PRN Meds: acetaminophen **OR** acetaminophen, ondansetron **OR** ondansetron (ZOFRAN) IV Continuous Infusions:   LOS: 8 days  Time spent: Greater than 50% of the 35 minute visit was spent in counseling/coordination of care for the patient as laid out in the A&P.   Dwyane Dee, MD [facility] 06/26/2020, 3:54 PM

## 2020-06-26 NOTE — Progress Notes (Signed)
Patient ID: Justin Rosales, male   DOB: 1951-01-08, 69 y.o.   MRN: [id number]    Progress Note   Subjective   day # 8 CC;  Ulc colitis / Cdiff   CRP down to 2  Creat 0.9 Wbc 10.3/ HGB 9.3  Vanco 125  Flagyl 500 Florastor  Questran once daily 7 pm  Patient says he is feeling pretty well, up in chair eating breakfast.  No complaints of abdominal pain or discomfort, no bleeding.  He had 3 bowel movements between 7 PM and 7 AM and one other bowel movement this morning, still loose to liquid.  Starting Eliquis today for DVT    Objective   Vital signs in last 24 hours: Temp:  [97.7 F (36.5 C)-98.1 F (36.7 C)] 98.1 F (36.7 C) (12/07 0643) Pulse Rate:  [90-97] 90 (12/07 0643) Resp:  [15-16] 16 (12/07 0643) BP: (125-147)/(61-70) 126/61 (12/07 0643) SpO2:  [97 %-100 %] 97 % (12/07 0643) Last BM Date: 06/25/20 General:    Older white male in NAD  Neurologic:  Alert and oriented,  grossly normal neurologically. Psych:  Cooperative. Normal mood and affect.  Intake/Output from previous day: 12/06 0701 - 12/07 0700 In: 182.8 [I.V.:182.8] Out: -  Intake/Output this shift: No intake/output data recorded.  Lab Results: Recent Labs    06/24/20 0706 06/25/20 0545 06/26/20 0637  WBC 13.9* 14.7* 10.3  HGB 9.6* 9.4* 9.3*  HCT 31.5* 30.5* 30.1*  PLT 453* 398 330   BMET Recent Labs    06/24/20 0706 06/26/20 0637  NA 132* 134*  K 4.0 3.7  CL 102 102  CO2 20* 23  GLUCOSE 100* 122*  BUN 9 11  CREATININE 1.02 0.92  CALCIUM 7.7* 7.5*   LFT Recent Labs    06/24/20 0706  PROT 4.8*  ALBUMIN 2.0*  AST 21  ALT 18  ALKPHOS 41  BILITOT 0.5   PT/INR No results for input(s): LABPROT, INR in the last 72 hours.  Studies/Results: VAS [place] UPPER EXTREMITY VENOUS DUPLEX  Result Date: 06/25/2020 UPPER VENOUS STUDY  Indications: Swelling Comparison Study: no prior Performing Technologist: Abram Sander RVS  Examination Guidelines: A complete evaluation includes B-mode imaging,  spectral Doppler, color Doppler, and power Doppler as needed of all accessible portions of each vessel. Bilateral testing is considered an integral part of a complete examination. Limited examinations for reoccurring indications may be performed as noted.  Right Findings: +----------+------------+---------+-----------+----------+-------+ RIGHT     CompressiblePhasicitySpontaneousPropertiesSummary +----------+------------+---------+-----------+----------+-------+ IJV           Full       Yes       Yes                      +----------+------------+---------+-----------+----------+-------+ Subclavian    Full       Yes       Yes                      +----------+------------+---------+-----------+----------+-------+ Axillary      Full       Yes       Yes                      +----------+------------+---------+-----------+----------+-------+ Brachial      Full       Yes       Yes                      +----------+------------+---------+-----------+----------+-------+ Radial  Full                                          +----------+------------+---------+-----------+----------+-------+ Ulnar         Full                                          +----------+------------+---------+-----------+----------+-------+ Cephalic      Full                                          +----------+------------+---------+-----------+----------+-------+ Basilic       Full                                          +----------+------------+---------+-----------+----------+-------+  Summary:  Right: No evidence of deep vein thrombosis in the upper extremity. No evidence of superficial vein thrombosis in the upper extremity. No evidence of thrombosis in the subclavian.  *See table(s) above for measurements and observations.  Diagnosing physician: Ruta Hinds MD Electronically signed by Ruta Hinds MD on 06/25/2020 at 5:01:51 PM.    Final        Assessment / Plan:     #60 69 year old male with history of ulcerative colitis which had been in endoscopic remission on Azulfidine with new diagnosis of C. difficile as an outpatient diagnosed about 2 weeks ago.  Patient failed to improve on outpatient vancomycin and was admitted about 1 week ago. He has continued on vancomycin 125 4 times daily and metronidazole 500 mg every 8 hours added to regimen. He has had gradual improvement over the past few days though continues to have loose to liquid stools frequently has been decreasing. He has been afebrile, tolerating p.o.'s without difficulty. WBC has finally normalized today and CRP has decreased significantly down to 2 today.  Parameters are encouraging.  #2 anemia, normocytic iron studies most consistent with anemia of chronic disease, he is status post transfusions and hemoglobin stable #3 hypertension #4 acute right lower extremity DVT #5 hypoalbuminemia-continue oral nutritional supplements  Plan; hopefully patient can be discharged in the next 24 hours. Plan to continue vancomycin 125 mg p.o. 4 times daily to complete 14 days since admit, then vancomycin 125 mg p.o. 3 times daily x7 days, then 125 mg p.o. twice daily x7 days, then 125 mg p.o. daily x7 days, then stop Complete 14 days total of metronidazole 500 mg 3 times daily Continue Florastor twice daily Continue Questran 4 g in 8 ounces of juice or water once daily to be taken at least 2 hours away from other meds We will arrange for outpatient follow-up in the office, and we will plan to repeat labs within a week of discharge. We will reassess in a.m.        Principal Problem:   Sepsis ([place]) Active Problems:   Ulcerative colitis with complication ([place])   Essential hypertension   C. difficile colitis   Anemia   DVT (deep venous thrombosis) ([place])   Ileus (HCC)   Hyponatremia     LOS: 8 days   Sharry Beining EsterwoodPA-C  06/26/2020, 8:39 AM

## 2020-06-26 NOTE — Discharge Instructions (Signed)
Information on my medicine - ELIQUIS (apixaban)  This medication education was reviewed with me or my healthcare representative as part of my discharge preparation.    Why was Eliquis prescribed for you? Eliquis was prescribed to treat blood clots that may have been found in the veins of your legs (deep vein thrombosis) or in your lungs (pulmonary embolism) and to reduce the risk of them occurring again.  What do You need to know about Eliquis ? The starting dose is 10 mg (two 5 mg tablets) taken TWICE daily for the FIRST SEVEN (7) DAYS, then on 07/03/20 the dose is reduced to ONE 5 mg tablet taken TWICE daily.  Eliquis may be taken with or without food.   Try to take the dose about the same time in the morning and in the evening. If you have difficulty swallowing the tablet whole please discuss with your pharmacist how to take the medication safely.  Take Eliquis exactly as prescribed and DO NOT stop taking Eliquis without talking to the doctor who prescribed the medication.  Stopping may increase your risk of developing a new blood clot.  Refill your prescription before you run out.  After discharge, you should have regular check-up appointments with your healthcare provider that is prescribing your Eliquis.    What do you do if you miss a dose? If a dose of ELIQUIS is not taken at the scheduled time, take it as soon as possible on the same day and twice-daily administration should be resumed. The dose should not be doubled to make up for a missed dose.  Important Safety Information A possible side effect of Eliquis is bleeding. You should call your healthcare provider right away if you experience any of the following: ? Bleeding from an injury or your nose that does not stop. ? Unusual colored urine (red or dark brown) or unusual colored stools (red or black). ? Unusual bruising for unknown reasons. ? A serious fall or if you hit your head (even if there is no bleeding).  Some  medicines may interact with Eliquis and might increase your risk of bleeding or clotting while on Eliquis. To help avoid this, consult your healthcare provider or pharmacist prior to using any new prescription or non-prescription medications, including herbals, vitamins, non-steroidal anti-inflammatory drugs (NSAIDs) and supplements.  This website has more information on Eliquis (apixaban): http://www.eliquis.com/eliquis/home

## 2020-06-27 ENCOUNTER — Other Ambulatory Visit: Payer: Self-pay

## 2020-06-27 DIAGNOSIS — A419 Sepsis, unspecified organism: Secondary | ICD-10-CM | POA: Diagnosis not present

## 2020-06-27 DIAGNOSIS — K921 Melena: Secondary | ICD-10-CM

## 2020-06-27 DIAGNOSIS — A0472 Enterocolitis due to Clostridium difficile, not specified as recurrent: Secondary | ICD-10-CM

## 2020-06-27 DIAGNOSIS — I82441 Acute embolism and thrombosis of right tibial vein: Secondary | ICD-10-CM | POA: Diagnosis not present

## 2020-06-27 LAB — BASIC METABOLIC PANEL
Anion gap: 8 (ref 5–15)
BUN: 8 mg/dL (ref 8–23)
CO2: 23 mmol/L (ref 22–32)
Calcium: 7.8 mg/dL — ABNORMAL LOW (ref 8.9–10.3)
Chloride: 103 mmol/L (ref 98–111)
Creatinine, Ser: 0.87 mg/dL (ref 0.61–1.24)
GFR, Estimated: >60 mL/min (ref >60)
Glucose, Bld: 87 mg/dL (ref 70–99)
Potassium: 4.2 mmol/L (ref 3.5–5.1)
Sodium: 134 mmol/L — ABNORMAL LOW (ref 135–145)

## 2020-06-27 LAB — CBC WITH DIFFERENTIAL/PLATELET
Abs Immature Granulocytes: 0.11 10*3/uL — ABNORMAL HIGH (ref 0.00–0.07)
Basophils Absolute: 0.1 10*3/uL (ref 0.0–0.1)
Basophils Relative: 1 %
Eosinophils Absolute: 0.2 10*3/uL (ref 0.0–0.5)
Eosinophils Relative: 2 %
HCT: 33.6 % — ABNORMAL LOW (ref 39.0–52.0)
Hemoglobin: 10.1 g/dL — ABNORMAL LOW (ref 13.0–17.0)
Immature Granulocytes: 1 %
Lymphocytes Relative: 28 %
Lymphs Abs: 2.4 10*3/uL (ref 0.7–4.0)
MCH: 26.8 pg (ref 26.0–34.0)
MCHC: 30.1 g/dL (ref 30.0–36.0)
MCV: 89.1 fL (ref 80.0–100.0)
Monocytes Absolute: 1 10*3/uL (ref 0.1–1.0)
Monocytes Relative: 11 %
Neutro Abs: 4.8 10*3/uL (ref 1.7–7.7)
Neutrophils Relative %: 57 %
Platelets: 410 10*3/uL — ABNORMAL HIGH (ref 150–400)
RBC: 3.77 MIL/uL — ABNORMAL LOW (ref 4.22–5.81)
RDW: 18.2 % — ABNORMAL HIGH (ref 11.5–15.5)
WBC: 8.5 10*3/uL (ref 4.0–10.5)
nRBC: 0 % (ref 0.0–0.2)

## 2020-06-27 LAB — MAGNESIUM: Magnesium: 2 mg/dL (ref 1.7–2.4)

## 2020-06-27 MED ORDER — APIXABAN 5 MG PO TABS: ORAL_TABLET | ORAL | 5 refills | Status: DC

## 2020-06-27 MED ORDER — METRONIDAZOLE 500 MG PO TABS: 500.0000 mg | ORAL_TABLET | Freq: Three times a day (TID) | ORAL | 0 refills | Status: AC

## 2020-06-27 MED ORDER — VANCOMYCIN HCL 125 MG PO CAPS: ORAL_CAPSULE | ORAL | 0 refills | Status: DC

## 2020-06-27 MED ORDER — APIXABAN 5 MG PO TABS
5.0000 mg | ORAL_TABLET | Freq: Two times a day (BID) | ORAL | 3 refills | Status: DC
Start: 2020-07-03 — End: 2020-06-27

## 2020-06-27 MED ORDER — APIXABAN 5 MG PO TABS
10.0000 mg | ORAL_TABLET | Freq: Two times a day (BID) | ORAL | 0 refills | Status: DC
Start: 2020-06-27 — End: 2020-06-27

## 2020-06-27 MED ORDER — ADULT MULTIVITAMIN W/MINERALS CH: 1.0000 | ORAL_TABLET | Freq: Every day | ORAL | Status: AC

## 2020-06-27 MED ORDER — CHOLESTYRAMINE LIGHT 4 G PO PACK
4.0000 g | PACK | ORAL | Status: DC
  Administered 2020-06-27: 4 g via ORAL
  Filled 2020-06-27: qty 1

## 2020-06-27 NOTE — Progress Notes (Signed)
Patient ID: Justin Rosales, male   DOB: 03/14/1951, 69 y.o.   MRN: [id number]    Progress Note   Subjective   Day # 9 CC; Ulcerative colitis, Cdiff  Vanco 125  Flagyl 500 Florastor  Questran QD  WBC 8.5, hgb 10.1  Patient says he is feeling well and is ready to go home.  He only had 2 bowel movements last night which is an improvement, none so far this morning. l  Objective   Vital signs in last 24 hours: Temp:  [97.6 F (36.4 C)-98.3 F (36.8 C)] 98.3 F (36.8 C) (12/08 0542) Pulse Rate:  [88-97] 88 (12/08 0542) Resp:  [15-16] 16 (12/08 0542) BP: (115-133)/(62-69) 115/62 (12/08 0542) SpO2:  [95 %-99 %] 96 % (12/08 0542) Last BM Date: 06/25/20 General: older    older white male in NAD  Neurologic:  Alert and oriented,  grossly normal neurologically. Psych:  Cooperative. Normal mood and affect.  Intake/Output from previous day: No intake/output data recorded. Intake/Output this shift: No intake/output data recorded.  Lab Results: Recent Labs    06/25/20 0545 06/26/20 0637 06/27/20 0706  WBC 14.7* 10.3 8.5  HGB 9.4* 9.3* 10.1*  HCT 30.5* 30.1* 33.6*  PLT 398 330 410*   BMET Recent Labs    06/26/20 0637  NA 134*  K 3.7  CL 102  CO2 23  GLUCOSE 122*  BUN 11  CREATININE 0.92  CALCIUM 7.5*   LFT No results for input(s): PROT, ALBUMIN, AST, ALT, ALKPHOS, BILITOT, BILIDIR, IBILI in the last 72 hours. PT/INR No results for input(s): LABPROT, INR in the last 72 hours.  Studies/Results: DG Abd 2 Views  Result Date: 06/26/2020 CLINICAL DATA:  Abdominal distension, history of C difficile and ulcer of colitis EXAM: ABDOMEN - 2 VIEW COMPARISON:  06/21/2020 FINDINGS: Lung bases clear. Mild gaseous distention of colon, decreased from previous exam. Diffuse bowel wall thickening of descending and sigmoid colon consistent with history of ulcerative colitis. No evidence of bowel obstruction or perforation. Small bowel gas pattern normal. 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 IMPRESSION: Bowel wall thickening of descending and sigmoid colon consistent with history of ulcerative colitis. Nonobstructive bowel gas pattern without evidence of perforation. Electronically Signed   By: Lavonia Dana M.D.   On: 06/26/2020 19:05   VAS [place] UPPER EXTREMITY VENOUS DUPLEX  Result Date: 06/25/2020 UPPER VENOUS STUDY  Indications: Swelling Comparison Study: no prior Performing Technologist: Abram Sander RVS  Examination Guidelines: A complete evaluation includes B-mode imaging, spectral Doppler, color Doppler, and power Doppler as needed of all accessible portions of each vessel. Bilateral testing is considered an integral part of a complete examination. Limited examinations for reoccurring indications may be performed as noted.  Right Findings: +----------+------------+---------+-----------+----------+-------+ RIGHT     CompressiblePhasicitySpontaneousPropertiesSummary +----------+------------+---------+-----------+----------+-------+ IJV           Full       Yes       Yes                      +----------+------------+---------+-----------+----------+-------+ Subclavian    Full       Yes       Yes                      +----------+------------+---------+-----------+----------+-------+ Axillary      Full       Yes       Yes                      +----------+------------+---------+-----------+----------+-------+  Brachial      Full       Yes       Yes                      +----------+------------+---------+-----------+----------+-------+ Radial        Full                                          +----------+------------+---------+-----------+----------+-------+ Ulnar         Full                                          +----------+------------+---------+-----------+----------+-------+ Cephalic      Full                                           +----------+------------+---------+-----------+----------+-------+ Basilic       Full                                          +----------+------------+---------+-----------+----------+-------+  Summary:  Right: No evidence of deep vein thrombosis in the upper extremity. No evidence of superficial vein thrombosis in the upper extremity. No evidence of thrombosis in the subclavian.  *See table(s) above for measurements and observations.  Diagnosing physician: Ruta Hinds MD Electronically signed by Ruta Hinds MD on 06/25/2020 at 5:01:51 PM.    Final        Assessment / Plan:    #70 69 year old white male with history of ulcerative colitis which had been in endoscopic remission, on Azulfidine, who was diagnosed with C. difficile colitis as an outpatient about 2 weeks prior to admission.  He was failing at home with persistent symptoms and admitted 8 days ago. He ha  s been on vancomycin 125 mg p.o. 4 times daily and metronidazole 500 mg p.o. 3 times daily and has had significant improvement over the past 4 to 5 days.  Questran was added to regimen earlier this week once daily, and tolerating. WBC remains normal.  #2 anemia, normocytic, patient did require transfusions earlier in the admission and hemoglobin has been stable since.  #3 hypertension 4.  New acute right lower extremity DVT-on Eliquis 5.  Hypoalbuminemia secondary to prolonged illness, continue nutritional supplements.  Plan patient is stable for discharge to home today Regular diet as tolerated Continue vancomycin 125 mg p.o. 4 times daily for another 6 days, then decrease to vancomycin 125 mg p.o. 3 times daily x7 days, then 125 mg p.o. twice daily x7 days, then 125 mg p.o. daily x7 days then stop. Complete 6 more days of Flagyl 500 mg p.o. 3 times daily Okay to continue Florastor twice daily Please discharged on Questran 4 g once daily to be taken in the evening at least 2 hours away from other meds.  Patient  should come to our office on Monday, December 13, [place] office lab for CBC. He has follow-up office appointment on December 15 at 2 PM with Avril Busser Shane Crutch, then follow-up office appointment with Dr. Rush Landmark on January 19 at 1:50 PM. Patient has been advised to  call for any issues in the interim.   Principal Problem:   Sepsis ([place]) Active Problems:   Ulcerative colitis with complication ([place])   Essential hypertension   C. difficile colitis   Anemia   DVT (deep venous thrombosis) ([place])   Ileus ([facility])   Hyponatremia     LOS: 9 days   Amor Packard EsterwoodPA-C  06/27/2020, 8:28 AM

## 2020-06-27 NOTE — Discharge Summary (Signed)
Physician Discharge Summary   Justin Rosales TGG:[id number] DOB: 1951-01-25 DOA: 06/18/2020  PCP: Shelda Pal, DO  Admit date: 06/18/2020 Discharge date: 06/27/2020  Admitted From: home Disposition:  home Discharging physician: Dwyane Dee, MD  Recommendations for Outpatient Follow-up:  1. Follow up with GI. 2. Needs BMP and CBC at follow up  Patient discharged to home in Discharge Condition: stable CODE STATUS: Full Diet recommendation:  Diet Orders (From admission, onward)    Start     Ordered   06/25/20 1303  Diet regular Room service appropriate? Yes; Fluid consistency: Thin  Diet effective now       Question Answer Comment  Room service appropriate? Yes   Fluid consistency: Thin      06/25/20 1302          Hospital Course: Justin Rosales is a 69 y.o. male with medical history significant of UC, HTN.  Pt with diarrhea for past couple of weeks, diagnosed with C. Diff based on GI pathogen pnl on 11/16.  Treatment with PO vanc started 11/19. Diarrhea initially bloody, now brown. Diarrhea has persisted since starting treatment.  Continued to have intermittent fevers.  BPs running on low side despite not taking BP meds. After addition of flagyl to oral vanc regimen he seems to have improved some slowly.   GI was consulted on admission as well. He started to show improvement after addition of Flagyl with oral vancomycin regimen.  He was also started on Questran and stools continue to decrease in frequency with slight increase in bulk. At discharge he was continued on a 14-day course total of Flagyl and prolonged vancomycin taper over 6 weeks.  He also had swelling in his lower extremities, notably his right lower extremity on admission.  He had been relatively immobile for several weeks at home due to his severe diarrhea and decreased energy.  He underwent venous duplex on admission and was found to have a right posterior tibial DVT.  Due to anemia and reports of bloody  stools, he was started on a heparin drip.  After hemoglobin showed stability, he was transitioned to Eliquis.  This was continued at discharge.   * Sepsis ([place]) - Patient met sepsis criteria on admission as he was febrile with a temperature of 101.4, tachycardic at a rate of 106, had a leukocytosis of 12.6 and a source of infection with C. Difficile on Admission  - re-engaged GI on 12/6. Stopped fiber, trial of questran - continue vanc PO for 6 week course and flagyl for 14 day course -Diarrhea slowly improved prior to discharge.  Was having approximately 2 stools per 12-hour period at discharge - outpatient follow up with GI  DVT (deep venous thrombosis) ([place]) - Initially thought to be in setting of poor nutritional status as his albumin is 1.9  - venous duplex 12/2 showed an acute right posterior tibial DVT - etiology considered immobility at home as patient not active for several weeks now with diarrhea - continue Heparin drip until 12/7 -Eliquis continued at discharge.  Will need hemoglobin check at follow-up  C. difficile colitis - see sepsis  Abdominal distension -Abdominal x-ray on 06/21/2020 showed persistent colonic distention consistent with colitis and/or ileus however he is eating well and having bowel movements -Repeat abdominal x-ray on 06/26/2020 does appear to show improvement  Anemia - Acute Blood Loss Anemia and Chronic Anemia of Chronic Dsease - Baseline appears to be around 11; Hgb dropped to 6.712/4. He got 1 unit pRBCs - he has  received iron sucrose 500 mg IV x1 - transfuse for Hgb less than 7 -Hemoglobin remaining stable.  DC heparin drip and start Eliquis on 06/26/2020  Ulcerative colitis with complication ([place]) - he has longstanding left-sided ulcerative colitis - continue sulfasalazine  Hyponatremia - Mild on admission at 128 and now improved slowly -Has steadily improved as diarrhea continues to improve  Essential hypertension -Home meds resumed at  discharge    The patient's chronic medical conditions were treated accordingly per the patient's home medication regimen except as noted.  On day of discharge, patient was felt deemed stable for discharge. Patient/family member advised to call PCP or come back to ER if needed.   Principal Diagnosis: Sepsis [facility])  Discharge Diagnoses: Active Hospital Problems   Diagnosis Date Noted  . Sepsis ([place]) 06/18/2020    Priority: High  . DVT (deep venous thrombosis) ([place]) 06/25/2020    Priority: High  . C. difficile colitis 06/18/2020    Priority: High  . Abdominal distension 06/25/2020    Priority: Medium  . Anemia 06/18/2020    Priority: Medium  . Ulcerative colitis with complication ([place]) 31/49/7026    Priority: Low  . Hyponatremia 06/25/2020  . Essential hypertension 08/30/2018    Resolved Hospital Problems   Diagnosis Date Noted Date Resolved  . Clostridium difficile colitis 06/18/2020 06/18/2020    Discharge Instructions    Increase activity slowly   Complete by: As directed      Allergies as of 06/27/2020   No Known Allergies     Medication List    STOP taking these medications   chlorthalidone 25 MG tablet Commonly known as: HYGROTON   ondansetron 4 MG disintegrating tablet Commonly known as: Zofran ODT     TAKE these medications   acetaminophen 500 MG tablet Commonly known as: TYLENOL Take 500 mg by mouth every 6 (six) hours as needed for fever.   amLODipine-valsartan 10-320 MG tablet Commonly known as: EXFORGE TAKE 1 TABLET BY MOUTH  DAILY   apixaban 5 MG Tabs tablet Commonly known as: Eliquis Take 2 tablets (40m) twice daily for 5 days, then 1 tablet (550m twice daily   FLORASTOR PO Take 1 capsule by mouth in the morning and at bedtime.   folic acid 1 MG tablet Commonly known as: FOLVITE TAKE 1 TABLET BY MOUTH  DAILY   Iron 325 (65 Fe) MG Tabs Take 1 tablet by mouth daily.   metroNIDAZOLE 500 MG tablet Commonly known as: FLAGYL Take 1 tablet  (500 mg total) by mouth every 8 (eight) hours for 6 days.   multivitamin with minerals Tabs tablet Take 1 tablet by mouth daily. Start taking on: June 28, 2020   NON FORMULARY Take 1 tablet by mouth daily. Vital reds-energy supplement   sulfaSALAzine 500 MG tablet Commonly known as: AZULFIDINE TAKE 2 TABLETS BY MOUTH 3  TIMES DAILY What changed: See the new instructions.   Turmeric 500 MG Tabs Take 1,000 mg by mouth daily.   vancomycin 125 MG capsule Commonly known as: VANCOCIN 1 capsule 4 times daily for 5 days, then 1 capsule twice daily for 7 days, then 1 capsule daily for 7 days, then 1 capsule every other day for 7 days, then 1 capsule every 3 days until gone What changed:   how much to take  how to take this  when to take this  additional instructions   VITAMIN D (CHOLECALCIFEROL) PO Take 5,000 Units by mouth daily.   ZZZQUIL PO Take 10 mLs by  mouth daily as needed (sleep).       No Known Allergies  Consultations: GI  Discharge Exam: BP 115/62 (BP Location: Left Arm)   Pulse 88   Temp 98.3 F (36.8 C) (Oral)   Resp 16   Ht 5' 10"  (1.778 m)   Wt 96.1 kg   SpO2 96%   BMI 30.40 kg/m  General appearance:Pleasant well-developed adult man sitting in chair in NAD Head:Normocephalic, without obvious abnormality, atraumatic Eyes:EOMI Lungs:clear to auscultation bilaterally Heart:regular rate and rhythm and S1, S2 normal Abdomen:Mildly distended but baseline, soft, nontender, bowel sounds present Extremities:Bilateral lower extremity pitting edema, approximately 2+. Nontender lower extremities. 1+ edema also noted in right upper extremity Skin:mobility and turgor normal Neurologic:Grossly normal  The results of significant diagnostics from this hospitalization (including imaging, microbiology, ancillary and laboratory) are listed below for reference.   Microbiology: Recent Results (from the past 240 hour(s))  Blood culture (routine x 2)      Status: None   Collection Time: 06/18/20  1:30 PM   Specimen: BLOOD  Result Value Ref Range Status   Specimen Description   Final    BLOOD RIGHT ANTECUBITAL Performed at [facility], [place] [address] [place], [place] [address]    Special Requests   Final    BOTTLES DRAWN AEROBIC AND ANAEROBIC Blood Culture adequate volume Performed at [facility], [address] [place], [place] [address]    Culture   Final    NO GROWTH 5 DAYS Performed at [facility], [place] [address], [place], [place] [address]    Report Status 06/23/2020 FINAL  Final  Resp Panel by RT-PCR (Flu A&B, Covid) Nasopharyngeal Swab     Status: None   Collection Time: 06/18/20  1:38 PM   Specimen: Nasopharyngeal Swab; Nasopharyngeal(NP) swabs in vial transport medium  Result Value Ref Range Status   SARS Coronavirus 2 by RT PCR NEGATIVE NEGATIVE Final    Comment: (NOTE) SARS-CoV-2 target nucleic acids are NOT DETECTED.  The SARS-CoV-2 RNA is generally detectable in upper respiratory specimens during the acute phase of infection. The lowest concentration of SARS-CoV-2 viral copies this assay can detect is 138 copies/mL. A negative result does not preclude SARS-Cov-2 infection and should not be used as the sole basis for treatment or other patient management decisions. A negative result may occur with  improper specimen collection/handling, submission of specimen other than nasopharyngeal swab, presence of viral mutation(s) within the areas targeted by this assay, and inadequate number of viral copies(<138 copies/mL). A negative result must be combined with clinical observations, patient history, and epidemiological information. The expected result is Negative.  Fact Sheet for Patients:  EntrepreneurPulse.com.au  Fact Sheet for Healthcare Providers:  IncredibleEmployment.be  This test is no t yet approved or cleared by the [place] FDA and  has  been authorized for detection and/or diagnosis of SARS-CoV-2 by FDA under an Emergency Use Authorization (EUA). This EUA will remain  in effect (meaning this test can be used) for the duration of the COVID-19 declaration under Section 564(b)(1) of the Act, 21 U.S.C.section 360bbb-3(b)(1), unless the authorization is terminated  or revoked sooner.       Influenza A by PCR NEGATIVE NEGATIVE Final   Influenza B by PCR NEGATIVE NEGATIVE Final    Comment: (NOTE) The Xpert Xpress SARS-CoV-2/FLU/RSV plus assay is intended as an aid in the diagnosis of influenza from Nasopharyngeal swab specimens and should not be used as a sole basis for  treatment. Nasal washings and aspirates are unacceptable for Xpert Xpress SARS-CoV-2/FLU/RSV testing.  Fact Sheet for Patients: EntrepreneurPulse.com.au  Fact Sheet for Healthcare Providers: IncredibleEmployment.be  This test is not yet approved or cleared by the [place] FDA and has been authorized for detection and/or diagnosis of SARS-CoV-2 by FDA under an Emergency Use Authorization (EUA). This EUA will remain in effect (meaning this test can be used) for the duration of the COVID-19 declaration under Section 564(b)(1) of the Act, 21 U.S.C. section 360bbb-3(b)(1), unless the authorization is terminated or revoked.  Performed at [facility], [address] [place], [place] [address]   Blood culture (routine x 2)     Status: None   Collection Time: 06/18/20  1:40 PM   Specimen: BLOOD RIGHT FOREARM  Result Value Ref Range Status   Specimen Description   Final    BLOOD RIGHT FOREARM Performed at [facility], [address] [place], [place] [address]    Special Requests   Final    BOTTLES DRAWN AEROBIC AND ANAEROBIC Blood Culture adequate volume Performed at [facility], [address] [place], [place] [address]    Culture   Final    NO GROWTH 5 DAYS Performed at  [facility], [place] [address] [place], [place] [address]    Report Status 06/23/2020 FINAL  Final     Labs: BNP (last 3 results) No results for input(s): BNP in the last 8760 hours. Basic Metabolic Panel: Recent Labs  Lab 06/21/20 0620 06/21/20 0620 06/22/20 0600 06/23/20 0116 06/24/20 0706 06/26/20 0637 06/27/20 0706  NA 132*   < > 132* 130* 132* 134* 134*  K 4.2   < > 3.8 3.8 4.0 3.7 4.2  CL 101   < > 103 100 102 102 103  CO2 23   < > 22 23 20* 23 23  GLUCOSE 126*   < > 107* 108* 100* 122* 87  BUN 9   < > 10 8 9 11 8   CREATININE 0.94   < > 0.91 0.86 1.02 0.92 0.87  CALCIUM 7.5*   < > 7.5* 7.4* 7.7* 7.5* 7.8*  MG 1.9   < > 1.7 1.7 2.0 1.8 2.0  PHOS 3.1  --  3.0 2.8  --   --   --    < > = values in this interval not displayed.   Liver Function Tests: Recent Labs  Lab 06/21/20 0620 06/22/20 0600 06/23/20 0116 06/24/20 0706  AST 15 18 14* 21  ALT 25 23 18 18   ALKPHOS 43 39 40 41  BILITOT 0.4 0.4 0.5 0.5  PROT 4.8* 4.4* 4.4* 4.8*  ALBUMIN 1.9* 1.8* 1.7* 2.0*   No results for input(s): LIPASE, AMYLASE in the last 168 hours. No results for input(s): AMMONIA in the last 168 hours. CBC: Recent Labs  Lab 06/22/20 0600 06/22/20 0600 06/22/20 1240 06/22/20 1240 06/23/20 0116 06/24/20 0706 06/25/20 0545 06/26/20 0637 06/27/20 0706  WBC 12.2*   < > 16.6*   < > 14.6* 13.9* 14.7* 10.3 8.5  NEUTROABS 8.4*  --  11.7*  --  9.5*  --   --  6.2 4.8  HGB 7.1*   < > 7.3*   < > 6.7* 9.6* 9.4* 9.3* 10.1*  HCT 23.6*   < > 24.1*   < > 21.8* 31.5* 30.5* 30.1* 33.6*  MCV 86.1   < > 86.7   < > 86.2 87.5 87.6 87.0  89.1  PLT 440*   < > 498*   < > 434* 453* 398 330 410*   < > = values in this interval not displayed.   Cardiac Enzymes: No results for input(s): CKTOTAL, CKMB, CKMBINDEX, TROPONINI in the last 168 hours. BNP: Invalid input(s): POCBNP CBG: No results for input(s): GLUCAP in the last 168 hours. D-Dimer No results for input(s): DDIMER in the last 72  hours. Hgb A1c No results for input(s): HGBA1C in the last 72 hours. Lipid Profile No results for input(s): CHOL, HDL, LDLCALC, TRIG, CHOLHDL, LDLDIRECT in the last 72 hours. Thyroid function studies No results for input(s): TSH, T4TOTAL, T3FREE, THYROIDAB in the last 72 hours.  Invalid input(s): FREET3 Anemia work up No results for input(s): VITAMINB12, FOLATE, FERRITIN, TIBC, IRON, RETICCTPCT in the last 72 hours. Urinalysis No results found for: COLORURINE, APPEARANCEUR, [place], [place], [place], [place], [place], [place], PROTEINUR, UROBILINOGEN, NITRITE, LEUKOCYTESUR Sepsis Labs Invalid input(s): PROCALCITONIN,  WBC,  LACTICIDVEN Microbiology Recent Results (from the past 240 hour(s))  Blood culture (routine x 2)     Status: None   Collection Time: 06/18/20  1:30 PM   Specimen: BLOOD  Result Value Ref Range Status   Specimen Description   Final    BLOOD RIGHT ANTECUBITAL Performed at [facility], [address], [place], [place] [address]    Special Requests   Final    BOTTLES DRAWN AEROBIC AND ANAEROBIC Blood Culture adequate volume Performed at [facility], [address] [place], [place] [address]    Culture   Final    NO GROWTH 5 DAYS Performed at [facility], [place] [address], [place], [place] [address]    Report Status 06/23/2020 FINAL  Final  Resp Panel by RT-PCR (Flu A&B, Covid) Nasopharyngeal Swab     Status: None   Collection Time: 06/18/20  1:38 PM   Specimen: Nasopharyngeal Swab; Nasopharyngeal(NP) swabs in vial transport medium  Result Value Ref Range Status   SARS Coronavirus 2 by RT PCR NEGATIVE NEGATIVE Final    Comment: (NOTE) SARS-CoV-2 target nucleic acids are NOT DETECTED.  The SARS-CoV-2 RNA is generally detectable in upper respiratory specimens during the acute phase of infection. The lowest concentration of SARS-CoV-2 viral copies this assay can detect is 138 copies/mL. A negative result does not preclude  SARS-Cov-2 infection and should not be used as the sole basis for treatment or other patient management decisions. A negative result may occur with  improper specimen collection/handling, submission of specimen other than nasopharyngeal swab, presence of viral mutation(s) within the areas targeted by this assay, and inadequate number of viral copies(<138 copies/mL). A negative result must be combined with clinical observations, patient history, and epidemiological information. The expected result is Negative.  Fact Sheet for Patients:  EntrepreneurPulse.com.au  Fact Sheet for Healthcare Providers:  IncredibleEmployment.be  This test is no t yet approved or cleared by the [place] FDA and  has been authorized for detection and/or diagnosis of SARS-CoV-2 by FDA under an Emergency Use Authorization (EUA). This EUA will remain  in effect (meaning this test can be used) for the duration of the COVID-19 declaration under Section 564(b)(1) of the Act, 21 U.S.C.section 360bbb-3(b)(1), unless the authorization is terminated  or revoked sooner.       Influenza A by PCR NEGATIVE NEGATIVE Final   Influenza B by PCR NEGATIVE NEGATIVE Final    Comment: (NOTE) The Xpert Xpress SARS-CoV-2/FLU/RSV plus assay is intended as an aid in  the diagnosis of influenza from Nasopharyngeal swab specimens and should not be used as a sole basis for treatment. Nasal washings and aspirates are unacceptable for Xpert Xpress SARS-CoV-2/FLU/RSV testing.  Fact Sheet for Patients: EntrepreneurPulse.com.au  Fact Sheet for Healthcare Providers: IncredibleEmployment.be  This test is not yet approved or cleared by the [place] FDA and has been authorized for detection and/or diagnosis of SARS-CoV-2 by FDA under an Emergency Use Authorization (EUA). This EUA will remain in effect (meaning this test can be used) for the duration of  the COVID-19 declaration under Section 564(b)(1) of the Act, 21 U.S.C. section 360bbb-3(b)(1), unless the authorization is terminated or revoked.  Performed at [facility], [address] [place], [place] [address]   Blood culture (routine x 2)     Status: None   Collection Time: 06/18/20  1:40 PM   Specimen: BLOOD RIGHT FOREARM  Result Value Ref Range Status   Specimen Description   Final    BLOOD RIGHT FOREARM Performed at [facility], [address] [place], [place] [address]    Special Requests   Final    BOTTLES DRAWN AEROBIC AND ANAEROBIC Blood Culture adequate volume Performed at [facility], [address] [place], [place] [address]    Culture   Final    NO GROWTH 5 DAYS Performed at [facility], [place] [address] [place], [place] [address]    Report Status 06/23/2020 FINAL  Final    Procedures/Studies: DG Abd 1 View  Result Date: 06/21/2020 CLINICAL DATA:  Abdominal distention. EXAM: ABDOMEN - 1 VIEW COMPARISON:  06/19/2020.  CT 06/18/2020. FINDINGS: Persistent colonic distention. Colonic wall thickening consistent with edema again noted. Similar findings noted on prior studies and are consistent with a colonic process such as colitis. Several air-filled loops of slightly prominent small bowel noted. No free air identified. Degenerative change lumbar spine. IMPRESSION: Persistent colonic distention. Colonic wall thickening again noted. Similar findings noted on prior studies and are again consistent with a colonic process such as colitis. Electronically Signed   By: Marcello Moores  Register   On: 06/21/2020 06:29   CT ABDOMEN PELVIS W CONTRAST  Result Date: 06/18/2020 CLINICAL DATA:  Diarrhea.  History of ulcerative colitis. EXAM: CT ABDOMEN AND PELVIS WITH CONTRAST TECHNIQUE: Multidetector CT imaging of the abdomen and pelvis was performed using the standard protocol following bolus administration of intravenous contrast. CONTRAST:   124m OMNIPAQUE IOHEXOL 300 MG/ML  SOLN COMPARISON:  No comparison studies available. FINDINGS: Lower chest: Unremarkable. Hepatobiliary: No suspicious focal abnormality within the liver parenchyma. There is no evidence for gallstones, gallbladder wall thickening, or pericholecystic fluid. No intrahepatic or extrahepatic biliary dilation. Pancreas: No focal mass lesion. No dilatation of the main duct. No intraparenchymal cyst. No peripancreatic edema. Spleen: No splenomegaly. No focal mass lesion. Adrenals/Urinary Tract: No adrenal nodule or mass. Exophytic low-density lesions lower pole right kidney are compatible with cyst. 5.6 cm low-density lesion lower interpolar left kidney is compatible with a cyst. Other scattered left renal cysts are associated with tiny left renal lesions too small to characterize but likely benign. No evidence for hydroureter. The urinary bladder appears normal for the degree of distention. Stomach/Bowel: Stomach is unremarkable. No gastric wall thickening. No evidence of outlet obstruction. Duodenum is normally positioned as is the ligament of Treitz. No small bowel wall thickening. No small bowel dilatation. The terminal ileum is normal. The appendix is normal. Right colon and hepatic flexure unremarkable. Left  colon is diffusely distended and featureless with mild circumferential wall thickening. Abnormal segment of colon starts in the distal transverse colon through the splenic flexure down to the rectum. Left colonic wall is ill-defined and there is subtle pericolonic edema/inflammation. Vascularity of the left colon is engorged. Vascular/Lymphatic: There is abdominal aortic atherosclerosis without aneurysm. There is no gastrohepatic or hepatoduodenal ligament lymphadenopathy. No retroperitoneal or mesenteric lymphadenopathy. No pelvic sidewall lymphadenopathy. Reproductive: The prostate gland and seminal vesicles are unremarkable. Other: No intraperitoneal free fluid.  Musculoskeletal: No worrisome lytic or sclerotic osseous abnormality. IMPRESSION: 1. Left colon is diffusely distended and featureless with mild circumferential wall thickening starting the distal transverse colon through the splenic flexure down to the rectum mild associated pericolonic edema/inflammation. Imaging features are compatible with infectious/inflammatory colitis 2. Bilateral renal cysts. 3. Aortic Atherosclerosis (ICD10-I70.0). Electronically Signed   By: Misty Stanley M.D.   On: 06/18/2020 15:02   DG Abd 2 Views  Result Date: 06/26/2020 CLINICAL DATA:  Abdominal distension, history of C difficile and ulcer of colitis EXAM: ABDOMEN - 2 VIEW COMPARISON:  06/21/2020 FINDINGS: Lung bases clear. Mild gaseous distention of colon, decreased from previous exam. Diffuse bowel wall thickening of descending and sigmoid colon consistent with history of ulcerative colitis. No evidence of bowel obstruction or perforation. Small bowel gas pattern normal. Osseous demineralization with degenerative disc and facet disease changes lumbar spine. IMPRESSION: Bowel wall thickening of descending and sigmoid colon consistent with history of ulcerative colitis. Nonobstructive bowel gas pattern without evidence of perforation. Electronically Signed   By: Lavonia Dana M.D.   On: 06/26/2020 19:05   DG Abd 2 Views  Result Date: 06/19/2020 CLINICAL DATA:  Persistent abdominal distension. EXAM: ABDOMEN - 2 VIEW COMPARISON:  None. FINDINGS: Progressive gaseous distension of the colon. No dilated small bowel loops identified. No signs of free air. Signs of bowel wall edema and loss of normal haustral markings noted involving the left colon compatible with colitis. IMPRESSION: 1. Progressive gaseous distension of the colon suggestive of worsening colonic ileus. 2. Signs of bowel wall edema involving the left colon compatible with colitis. Electronically Signed   By: Kerby Moors M.D.   On: 06/19/2020 09:13   VAS [place] LOWER  EXTREMITY VENOUS (DVT)  Result Date: 06/22/2020  Lower Venous DVT Study Indications: Edema.  Risk Factors: None identified. Limitations: Poor ultrasound/tissue interface. Comparison Study: No prior studies. Performing Technologist: Oliver Hum RVT  Examination Guidelines: A complete evaluation includes B-mode imaging, spectral Doppler, color Doppler, and power Doppler as needed of all accessible portions of each vessel. Bilateral testing is considered an integral part of a complete examination. Limited examinations for reoccurring indications may be performed as noted. The reflux portion of the exam is performed with the patient in reverse Trendelenburg.  +---------+---------------+---------+-----------+----------+--------------+ RIGHT    CompressibilityPhasicitySpontaneityPropertiesThrombus Aging +---------+---------------+---------+-----------+----------+--------------+ CFV      Full           Yes      Yes                                 +---------+---------------+---------+-----------+----------+--------------+ SFJ      Full                                                        +---------+---------------+---------+-----------+----------+--------------+  FV Prox  Full                                                        +---------+---------------+---------+-----------+----------+--------------+ FV Mid   Full                                                        +---------+---------------+---------+-----------+----------+--------------+ FV DistalFull                                                        +---------+---------------+---------+-----------+----------+--------------+ PFV      Full                                                        +---------+---------------+---------+-----------+----------+--------------+ POP      Full           Yes      Yes                                  +---------+---------------+---------+-----------+----------+--------------+ PTV      Partial                                      Acute          +---------+---------------+---------+-----------+----------+--------------+ PERO     Full                                                        +---------+---------------+---------+-----------+----------+--------------+   +---------+---------------+---------+-----------+----------+--------------+ LEFT     CompressibilityPhasicitySpontaneityPropertiesThrombus Aging +---------+---------------+---------+-----------+----------+--------------+ CFV      Full           Yes      Yes                                 +---------+---------------+---------+-----------+----------+--------------+ SFJ      Full                                                        +---------+---------------+---------+-----------+----------+--------------+ FV Prox  Full                                                        +---------+---------------+---------+-----------+----------+--------------+  FV Mid   Full                                                        +---------+---------------+---------+-----------+----------+--------------+ FV DistalFull                                                        +---------+---------------+---------+-----------+----------+--------------+ PFV      Full                                                        +---------+---------------+---------+-----------+----------+--------------+ POP      Full           Yes      Yes                                 +---------+---------------+---------+-----------+----------+--------------+ PTV      Full                                                        +---------+---------------+---------+-----------+----------+--------------+ PERO     Full                                                         +---------+---------------+---------+-----------+----------+--------------+     Summary: RIGHT: - Findings consistent with acute deep vein thrombosis involving the right posterior tibial veins. - No cystic structure found in the popliteal fossa.  LEFT: - There is no evidence of deep vein thrombosis in the lower extremity.  - No cystic structure found in the popliteal fossa.  *See table(s) above for measurements and observations. Electronically signed by Jamelle Haring on 06/22/2020 at 1:45:59 PM.    Final    VAS [place] UPPER EXTREMITY VENOUS DUPLEX  Result Date: 06/25/2020 UPPER VENOUS STUDY  Indications: Swelling Comparison Study: no prior Performing Technologist: Abram Sander RVS  Examination Guidelines: A complete evaluation includes B-mode imaging, spectral Doppler, color Doppler, and power Doppler as needed of all accessible portions of each vessel. Bilateral testing is considered an integral part of a complete examination. Limited examinations for reoccurring indications may be performed as noted.  Right Findings: +----------+------------+---------+-----------+----------+-------+ RIGHT     CompressiblePhasicitySpontaneousPropertiesSummary +----------+------------+---------+-----------+----------+-------+ IJV           Full       Yes       Yes                      +----------+------------+---------+-----------+----------+-------+ Subclavian    Full       Yes       Yes                      +----------+------------+---------+-----------+----------+-------+  Axillary      Full       Yes       Yes                      +----------+------------+---------+-----------+----------+-------+ Brachial      Full       Yes       Yes                      +----------+------------+---------+-----------+----------+-------+ Radial        Full                                          +----------+------------+---------+-----------+----------+-------+ Ulnar         Full                                           +----------+------------+---------+-----------+----------+-------+ Cephalic      Full                                          +----------+------------+---------+-----------+----------+-------+ Basilic       Full                                          +----------+------------+---------+-----------+----------+-------+  Summary:  Right: No evidence of deep vein thrombosis in the upper extremity. No evidence of superficial vein thrombosis in the upper extremity. No evidence of thrombosis in the subclavian.  *See table(s) above for measurements and observations.  Diagnosing physician: Ruta Hinds MD Electronically signed by Ruta Hinds MD on 06/25/2020 at 5:01:51 PM.    Final      Time coordinating discharge: Over 67 minutes    Dwyane Dee, MD  [facility] 06/27/2020, 5:03 PM

## 2020-07-02 ENCOUNTER — Other Ambulatory Visit (INDEPENDENT_AMBULATORY_CARE_PROVIDER_SITE_OTHER): Payer: Medicare Other

## 2020-07-02 ENCOUNTER — Other Ambulatory Visit: Payer: Self-pay

## 2020-07-02 ENCOUNTER — Telehealth: Payer: Self-pay

## 2020-07-02 DIAGNOSIS — A021 Salmonella sepsis: Secondary | ICD-10-CM

## 2020-07-02 DIAGNOSIS — K921 Melena: Secondary | ICD-10-CM

## 2020-07-02 DIAGNOSIS — A0472 Enterocolitis due to Clostridium difficile, not specified as recurrent: Secondary | ICD-10-CM

## 2020-07-02 LAB — COMPREHENSIVE METABOLIC PANEL
ALT: 9 U/L (ref 0–53)
AST: 16 U/L (ref 0–37)
Albumin: 2.5 g/dL — ABNORMAL LOW (ref 3.5–5.2)
Alkaline Phosphatase: 40 U/L (ref 39–117)
BUN: 16 mg/dL (ref 6–23)
CO2: 30 mEq/L (ref 19–32)
Calcium: 8.2 mg/dL — ABNORMAL LOW (ref 8.4–10.5)
Chloride: 92 mEq/L — ABNORMAL LOW (ref 96–112)
Creatinine, Ser: 0.94 mg/dL (ref 0.40–1.50)
GFR: 82.85 mL/min (ref >60.00)
Glucose, Bld: 101 mg/dL — ABNORMAL HIGH (ref 70–99)
Potassium: 4.8 mEq/L (ref 3.5–5.1)
Sodium: 128 mEq/L — ABNORMAL LOW (ref 135–145)
Total Bilirubin: 0.3 mg/dL (ref 0.2–1.2)
Total Protein: 5.9 g/dL — ABNORMAL LOW (ref 6.0–8.3)

## 2020-07-02 LAB — CBC WITH DIFFERENTIAL/PLATELET
Basophils Absolute: 0.1 10*3/uL (ref 0.0–0.1)
Basophils Relative: 0.8 % (ref 0.0–3.0)
Eosinophils Absolute: 0.1 10*3/uL (ref 0.0–0.7)
Eosinophils Relative: 0.6 % (ref 0.0–5.0)
HCT: 33.7 % — ABNORMAL LOW (ref 39.0–52.0)
Hemoglobin: 10.9 g/dL — ABNORMAL LOW (ref 13.0–17.0)
Lymphocytes Relative: 26.9 % (ref 12.0–46.0)
Lymphs Abs: 3.4 10*3/uL (ref 0.7–4.0)
MCHC: 32.4 g/dL (ref 30.0–36.0)
MCV: 82.6 fl (ref 78.0–100.0)
Monocytes Absolute: 2.2 10*3/uL — ABNORMAL HIGH (ref 0.1–1.0)
Monocytes Relative: 18 % — ABNORMAL HIGH (ref 3.0–12.0)
Neutro Abs: 6.7 10*3/uL (ref 1.4–7.7)
Neutrophils Relative %: 53.7 % (ref 43.0–77.0)
Platelets: 590 10*3/uL — ABNORMAL HIGH (ref 150.0–400.0)
RBC: 4.08 Mil/uL — ABNORMAL LOW (ref 4.22–5.81)
RDW: 18.4 % — ABNORMAL HIGH (ref 11.5–15.5)
WBC: 12.5 10*3/uL — ABNORMAL HIGH (ref 4.0–10.5)

## 2020-07-02 MED ORDER — METOCLOPRAMIDE HCL 10 MG PO TABS: 10.0000 mg | ORAL_TABLET | Freq: Three times a day (TID) | ORAL | 0 refills | Status: DC

## 2020-07-02 MED ORDER — ONDANSETRON 4 MG PO TBDP: 4.0000 mg | ORAL_TABLET | Freq: Three times a day (TID) | ORAL | 0 refills | Status: DC | PRN

## 2020-07-02 NOTE — Telephone Encounter (Signed)
Discussed on the phone today.  Recommended starting Reglan 10 mg 3 times daily along with continued Zofran prn breakthrough nausea.  Agree with CMP check to evaluate for electrolyte abnormalities and hypoalbuminemia.  Has short interval follow-up scheduled for 12/15.  If not improving, can add Compazine.

## 2020-07-02 NOTE — Telephone Encounter (Signed)
I spoke with the patient this afternoon regarding lab results, and n/f the following:  - Sodium 128, chloride 92.  BUN/creatinine normal.  - Albumin 2.5 (uptrending from hospital d/c) and otherwise normal liver enzymes.   - WBC elevated at 12.5 with elevated PLT compared with hospital discharge.  He states he is feeling a little better this afternoon.  Tolerating Pedialyte.  Started taking Reglan as prescribed.  Still having 5-7 mushy stools/day.  Plan for the following:  -Keep follow-up appointment for 07/04/2020 in the GI clinic along with appointment 07/04/2020 with Dr. Nani Ravens -Plan for repeat labs on 07/04/2020 -Continue Reglan as prescribed for now -Completed Flagyl -Continue p.o. intake as tolerated  Amy, keeping you in the loop FYI since he has appointment with you later this week.

## 2020-07-02 NOTE — Telephone Encounter (Signed)
Cmet results are in  Sodium is 128

## 2020-07-02 NOTE — Telephone Encounter (Signed)
Patient and spouse in to have follow up CBC drawn. Came to the office for medical guidance. Recent hospitalization for sepsis, DVT and C-diff infection.  Patient states he is very nauseated and he is frequently vomiting. He feels very certain the Flagyl is contributing to this. He has Ondansetron SL however it is not controlling his vomiting. He is extremely fatigued. Skin is cool and dry. Color is very pale. He appears sick. Ankles have 2 to 3 + pitting edema. Discussed rehydration with Pedialyte, taking a swallow every 15 minutes. Discussed the need to lie supine and limit his time sitting. He has not tolerated elevation of his legs as this increases his GI issues with nausea and belching.  I added a Cmet, but can cancel if the lab is un-necessary.  What can we do about the nausea? He will take his last dose of Flagyl today. His stools are still unformed in excess of 4 a day. I have sent him back home to await further instructions and advice. Thanks

## 2020-07-04 ENCOUNTER — Other Ambulatory Visit (INDEPENDENT_AMBULATORY_CARE_PROVIDER_SITE_OTHER): Payer: Medicare Other

## 2020-07-04 ENCOUNTER — Ambulatory Visit (INDEPENDENT_AMBULATORY_CARE_PROVIDER_SITE_OTHER): Payer: Medicare Other | Admitting: Family Medicine

## 2020-07-04 ENCOUNTER — Encounter: Payer: Self-pay | Admitting: Family Medicine

## 2020-07-04 ENCOUNTER — Encounter: Payer: Self-pay | Admitting: Physician Assistant

## 2020-07-04 ENCOUNTER — Other Ambulatory Visit: Payer: Self-pay

## 2020-07-04 ENCOUNTER — Ambulatory Visit (INDEPENDENT_AMBULATORY_CARE_PROVIDER_SITE_OTHER): Payer: Medicare Other | Admitting: Physician Assistant

## 2020-07-04 VITALS — BP 134/70 | HR 128 | Ht 69.5 in | Wt 204.2 lb

## 2020-07-04 VITALS — BP 128/72 | HR 137 | Temp 98.0°F | Ht 70.0 in | Wt 205.0 lb

## 2020-07-04 DIAGNOSIS — A0472 Enterocolitis due to Clostridium difficile, not specified as recurrent: Secondary | ICD-10-CM

## 2020-07-04 DIAGNOSIS — R600 Localized edema: Secondary | ICD-10-CM | POA: Diagnosis not present

## 2020-07-04 DIAGNOSIS — R531 Weakness: Secondary | ICD-10-CM | POA: Diagnosis not present

## 2020-07-04 DIAGNOSIS — E871 Hypo-osmolality and hyponatremia: Secondary | ICD-10-CM

## 2020-07-04 DIAGNOSIS — K51919 Ulcerative colitis, unspecified with unspecified complications: Secondary | ICD-10-CM

## 2020-07-04 DIAGNOSIS — I10 Essential (primary) hypertension: Secondary | ICD-10-CM

## 2020-07-04 DIAGNOSIS — I82441 Acute embolism and thrombosis of right tibial vein: Secondary | ICD-10-CM | POA: Diagnosis not present

## 2020-07-04 LAB — CBC WITH DIFFERENTIAL/PLATELET
Basophils Absolute: 0.1 10*3/uL (ref 0.0–0.1)
Basophils Relative: 0.7 % (ref 0.0–3.0)
Eosinophils Absolute: 0.1 10*3/uL (ref 0.0–0.7)
Eosinophils Relative: 0.5 % (ref 0.0–5.0)
HCT: 34.5 % — ABNORMAL LOW (ref 39.0–52.0)
Hemoglobin: 10.9 g/dL — ABNORMAL LOW (ref 13.0–17.0)
Lymphocytes Relative: 18 % (ref 12.0–46.0)
Lymphs Abs: 2.8 10*3/uL (ref 0.7–4.0)
MCHC: 31.5 g/dL (ref 30.0–36.0)
MCV: 82.3 fl (ref 78.0–100.0)
Monocytes Absolute: 1.7 10*3/uL — ABNORMAL HIGH (ref 0.1–1.0)
Monocytes Relative: 10.9 % (ref 3.0–12.0)
Neutro Abs: 10.7 10*3/uL — ABNORMAL HIGH (ref 1.4–7.7)
Neutrophils Relative %: 69.9 % (ref 43.0–77.0)
Platelets: 562 10*3/uL — ABNORMAL HIGH (ref 150.0–400.0)
RBC: 4.19 Mil/uL — ABNORMAL LOW (ref 4.22–5.81)
RDW: 18.5 % — ABNORMAL HIGH (ref 11.5–15.5)
WBC: 15.3 10*3/uL — ABNORMAL HIGH (ref 4.0–10.5)

## 2020-07-04 LAB — BASIC METABOLIC PANEL
BUN: 14 mg/dL (ref 6–23)
CO2: 30 mEq/L (ref 19–32)
Calcium: 8.2 mg/dL — ABNORMAL LOW (ref 8.4–10.5)
Chloride: 96 mEq/L (ref 96–112)
Creatinine, Ser: 0.91 mg/dL (ref 0.40–1.50)
GFR: 86.13 mL/min (ref >60.00)
Glucose, Bld: 123 mg/dL — ABNORMAL HIGH (ref 70–99)
Potassium: 4.8 mEq/L (ref 3.5–5.1)
Sodium: 131 mEq/L — ABNORMAL LOW (ref 135–145)

## 2020-07-04 LAB — C-REACTIVE PROTEIN: CRP: 4.4 mg/dL (ref 0.5–20.0)

## 2020-07-04 MED ORDER — APIXABAN 5 MG PO TABS: ORAL_TABLET | ORAL | 5 refills | Status: DC

## 2020-07-04 MED ORDER — PANTOPRAZOLE SODIUM 40 MG PO TBEC: 40.0000 mg | DELAYED_RELEASE_TABLET | Freq: Every day | ORAL | 1 refills | Status: DC

## 2020-07-04 NOTE — Patient Instructions (Addendum)
Try to drink 55-60 oz of water daily outside of exercise. We are a bit dry.   Check your blood pressures 2-3 times per week, alternating the time of day you check it. If it is high, considering waiting 1-2 minutes and rechecking. If it gets higher, your anxiety is likely creeping up and we should avoid rechecking. Send me a message in 2-3 weeks with some readings. Goal is <150/90.  For the swelling in your lower extremities, be sure to elevate your legs when able, mind the salt intake, stay physically active and consider wearing compression stockings. Send me a message in 7-10 days if no improvement.  Add back foods slowly.   Let us know if you need anything.

## 2020-07-04 NOTE — Progress Notes (Addendum)
Subjective:    Patient ID: Justin Rosales, male    DOB: Mar 31, 1951, 69 y.o.   MRN: [id number]  HPI Justin Rosales is a pleasant 69 year old white male, now established with Dr. Rush Landmark,, who comes in today for post hospital follow-up.  He was hospitalized 06/18/2020 through 06/27/2020 with C. difficile colitis.  Patient met sepsis criteria on admission.  Course was complicated by right lower extremity DVT, and he is now on Eliquis. Patient has history of underlying ulcerative colitis which had been in endoscopic remission on Azulfidine.  He was diagnosed with C. difficile colitis about 2 weeks prior to admission, and started on a course of oral vancomycin.  Due to persistence of symptoms with significant diarrhea, weakness, and development of fever he was hospitalized. He was continued on oral vancomycin 125 mg p.o. 4 times daily and metronidazole was added to his regimen.Marland Kitchen  He was also noted to be significantly anemic on admission with hemoglobin in the 7 range and did require transfusions and IV iron. He had a slow gradual improvement in symptoms, Questran was added once daily. By 12 6 he was feeling significantly better, eating better and had a continued decrease in number of stools per day down to about 5 per 6/day still loose to liquid. Patient had called 2 days ago with complaints of nausea and vomiting.  Apparently this past weekend he had persistent nausea and some episodes of nausea and vomiting Saturday Sunday into Monday.  He felt that the oral Flagyl was causing nausea.  This was stopped as of Monday though he had completed the majority of the course.  He was started on Reglan 3 times daily and was to continue Zofran as needed. Today he comes in stating that he still quite weak but has been able to eat better over the past 24 hours.  He had not been pushing liquids and had been taking in about 30 cc/day.  He has not had any fever or chills, has no complaints of abdominal pain.  The nausea has  resolved.  He continues to have 5-6 bowel movements per 24 hours, liquid to mushy, no blood.  Denies any cramping.  He has had some hiccups and indigestion. He denies any dizziness or lightheadedness.  He notes that he has bilateral lower extremity edema which he also had on discharge from the hospital. Labs had been done on 07/02/2020-WBC 12.5, hemoglobin 10.9 hematocrit 33.7 stable Sodium 128/potassium 4.8/creatinine 0.94.  Review of Systems Pertinent positive and negative review of systems were noted in the above HPI section.  All other review of systems was otherwise negative.  Outpatient Encounter Medications as of 07/04/2020  Medication Sig  . apixaban (ELIQUIS) 5 MG TABS tablet Take 1 tablet (15m) twice daily  . diphenhydrAMINE HCl, Sleep, (ZZZQUIL PO) Take 10 mLs by mouth daily as needed (sleep).  . Ferrous Sulfate (IRON) 325 (65 Fe) MG TABS Take 1 tablet by mouth daily.  . folic acid (FOLVITE) 1 MG tablet TAKE 1 TABLET BY MOUTH  DAILY (Patient taking differently: Take 1 mg by mouth daily.)  . metoCLOPramide (REGLAN) 10 MG tablet Take 1 tablet (10 mg total) by mouth 3 (three) times daily.  . Multiple Vitamin (MULTIVITAMIN WITH MINERALS) TABS tablet Take 1 tablet by mouth daily.  . NON FORMULARY Take 1 tablet by mouth daily. Vital reds-energy supplement  . ondansetron (ZOFRAN-ODT) 4 MG disintegrating tablet Take 1 tablet (4 mg total) by mouth every 8 (eight) hours as needed for nausea or vomiting.  .Marland Kitchen  Saccharomyces boulardii (FLORASTOR PO) Take 1 capsule by mouth in the morning and at bedtime.  . sulfaSALAzine (AZULFIDINE) 500 MG tablet TAKE 2 TABLETS BY MOUTH 3  TIMES DAILY (Patient taking differently: Take 1,000 mg by mouth 3 (three) times daily.)  . Turmeric 500 MG TABS Take 1,000 mg by mouth daily.  . vancomycin (VANCOCIN) 125 MG capsule 1 capsule 4 times daily for 5 days, then 1 capsule twice daily for 7 days, then 1 capsule daily for 7 days, then 1 capsule every other day for 7 days,  then 1 capsule every 3 days until gone  . VITAMIN D, CHOLECALCIFEROL, PO Take 5,000 Units by mouth daily.  . pantoprazole (PROTONIX) 40 MG tablet Take 1 tablet (40 mg total) by mouth daily before breakfast.  . [DISCONTINUED] acetaminophen (TYLENOL) 500 MG tablet Take 500 mg by mouth every 6 (six) hours as needed for fever.  . [DISCONTINUED] amLODipine-valsartan (EXFORGE) 10-320 MG tablet TAKE 1 TABLET BY MOUTH  DAILY  . [DISCONTINUED] apixaban (ELIQUIS) 5 MG TABS tablet Take 2 tablets (4m) twice daily for 5 days, then 1 tablet (567m twice daily   No facility-administered encounter medications on file as of 07/04/2020.   No Known Allergies Patient Active Problem List   Diagnosis Date Noted  . DVT (deep venous thrombosis) (HCDwight12/12/2019  . Abdominal distension 06/25/2020  . Hyponatremia 06/25/2020  . C. difficile colitis 06/18/2020  . Anemia 06/18/2020  . Sepsis (HCB[facility] Mead11/29/2021  . Obesity (BMI 30-39.9) 07/11/2019  . Ulcerative colitis with complication (HCAlleghany0234/19/6222. Essential hypertension 08/30/2018   Social History   Socioeconomic History  . Marital status: Married    Spouse name: Not on file  . Number of children: 2  . Years of education: Not on file  . Highest education level: Not on file  Occupational History  . Occupation: PrEmergency planning/management officerTobacco Use  . Smoking status: Former SmResearch scientist (life sciences). Smokeless tobacco: Never Used  Vaping Use  . Vaping Use: Never used  Substance and Sexual Activity  . Alcohol use: Yes    Comment: ocassionally  . Drug use: Not Currently  . Sexual activity: Not on file  Other Topics Concern  . Not on file  Social History Narrative  . Not on file   Social Determinants of Health   Financial Resource Strain: Not on file  Food Insecurity: Not on file  Transportation Needs: Not on file  Physical Activity: Not on file  Stress: Not on file  Social Connections: Not on file  Intimate Partner Violence: Not on file    Mr. Justin Rosales  history includes Alzheimer's disease in his father and mother; Diabetes in his mother; Hypertension in his mother; Parkinson's disease in his father.      Objective:    Vitals:   07/04/20 1401  BP: 134/70  Pulse: (!) 128    Physical Exam Well-developed well-nourished  Older WM  in no acute distress.  Accompanied by his wife,  height, Weight,204  BMI29.3  HEENT; nontraumatic normocephalic, EOMI, PE RR LA, sclera anicteric. Oropharynx; not examined Neck; supple, no JVD Cardiovascular; tachycardic regular rate and rhythm with S1-S2, no murmur rub or gallop Pulmonary; Clear bilaterally Abdomen; soft, nontender, nondistended, no palpable mass or hepatosplenomegaly, bowel sounds are active Rectal; not done Skin; benign exam, no jaundice rash or appreciable lesions Extremities; no clubbing cyanosis or edema skin warm and dry Neuro/Psych; alert and oriented x4, grossly nonfocal mood and affect appropriate      Assessment &  Plan:   #44 69 year old white male with history of ulcerative colitis which has been in endoscopic remission, on Azulfidine, seen today in post hospital follow-up with prolonged episode of refractory C. difficile colitis.  He had been on oral vancomycin at home for about 2 weeks prior to hospitalization and had met criteria for sepsis on admission with tachycardia and fever. He was continued on oral vancomycin 125 mg p.o. 4 times daily and had metronidazole 500 mg p.o. 3 times daily added to his regimen. Patient gradually improved and was able to be discharged on 06/27/2020 to complete a 14-day course of metronidazole.  He was able to complete most of this but developed nausea and vomiting. He continues on vancomycin taper now down to vancomycin 125 p.o. twice daily x1 week.  #2 nausea and vomiting resolved-probably secondary to oral metronidazole Continuing Reglan 10 mg 3 times daily before meals and Zofran as needed  #3 mild hyponatremia #4 tachycardia-suspect this  is secondary to relative volume depletion, blood pressure stable #5 generalized weakness #6 right lower extremity DVT-acute, on Eliquis  Plan; patient encouraged to push fluids, needs to drink 60 to 80 ounces per day Continue Reglan 10 mg p.o. 3 times daily, have asked them to try to gradually wean off of Reglan over the next 4 to 5 days if nausea has subsided. Continue Zofran 4 mg every 6 hours as needed Continue vancomycin 125 mg p.o. twice daily x7 days, then vancomycin 125 p.o. daily x7 days, then vancomycin 125 every other day x1 week. Okay to continue Florastor Continue Azulfidine 500 mg 2 p.o. 3 times daily Add Protonix 40 mg p.o. daily x1 month. CBC with differential, be met, and CRP today  He  will continue to need close follow-up, will decide regarding interval for office follow-up and labs pending today's labs and his course over the next few days.Alfredia Ferguson PA-C 07/04/2020   Cc: Shelda Pal*

## 2020-07-04 NOTE — Progress Notes (Signed)
Chief Complaint  Patient presents with  . Hospitalization Follow-up    HPI Justin Rosales is a 69 y.o. y.o. male who presents for a transition of care visit.  Pt was discharged from [facility] on 06/27/20.  Here w spouse.   The patient was admitted on 11/29 and discharged on 12/8 for C. difficile colitis.  While in the hospital, he was found to have a right-sided DVT.  He was started on Eliquis.  He is currently taking 5 mg twice daily.  For the C. difficile colitis, he is taking vancomycin 125 mg twice daily currently.  This is to be tapered down over the next several weeks.  He saw the gastroenterology team on Monday and found to have an elevated white count at 12.5 compared with hospital discharge and sodium of 128, slightly decreased from hospitalization.  He is due to have repeat labs today and a follow-up appoint with them this afternoon.  He has never had any pain.  He is continuing to have some loose stools but this is steadily getting better.  He is compliant with the vancomycin.  He is using Zofran and Reglan for symptom control.  He is no longer taking Flagyl.  He is only drinking around 30 ounces of fluids daily.  He still feels weak.  No fevers.  Past Medical History:  Diagnosis Date  . Anemia   . Arthritis   . Elevated cholesterol   . Hypertension   . UC (ulcerative colitis) [facility])    Past Surgical History:  Procedure Laterality Date  . COLONOSCOPY     First done at [facility] around age 5. Dr Dorrene German possibly With [facility] x2. last one done around 2012    Family History  Problem Relation Age of Onset  . Hypertension Mother   . Diabetes Mother   . Alzheimer's disease Mother   . Parkinson's disease Father   . Alzheimer's disease Father   . Colon cancer Neg Hx   . Esophageal cancer Neg Hx   . Rectal cancer Neg Hx   . Stomach cancer Neg Hx    Allergies as of 07/04/2020   No Known Allergies     Medication List       Accurate as of July 04, 2020 12:21 PM. If  you have any questions, ask your nurse or doctor.        STOP taking these medications   amLODipine-valsartan 10-320 MG tablet Commonly known as: EXFORGE Stopped by: Shelda Pal, DO     TAKE these medications   acetaminophen 500 MG tablet Commonly known as: TYLENOL Take 500 mg by mouth every 6 (six) hours as needed for fever.   apixaban 5 MG Tabs tablet Commonly known as: Eliquis Take 1 tablet (60m) twice daily What changed: additional instructions Changed by: NShelda Pal DO   FLORASTOR PO Take 1 capsule by mouth in the morning and at bedtime.   folic acid 1 MG tablet Commonly known as: FOLVITE TAKE 1 TABLET BY MOUTH  DAILY   Iron 325 (65 Fe) MG Tabs Take 1 tablet by mouth daily.   metoCLOPramide 10 MG tablet Commonly known as: Reglan Take 1 tablet (10 mg total) by mouth 3 (three) times daily.   multivitamin with minerals Tabs tablet Take 1 tablet by mouth daily.   NON FORMULARY Take 1 tablet by mouth daily. Vital reds-energy supplement   ondansetron 4 MG disintegrating tablet Commonly known as: ZOFRAN-ODT Take 1 tablet (4 mg total) by mouth every  8 (eight) hours as needed for nausea or vomiting.   sulfaSALAzine 500 MG tablet Commonly known as: AZULFIDINE TAKE 2 TABLETS BY MOUTH 3  TIMES DAILY What changed: See the new instructions.   Turmeric 500 MG Tabs Take 1,000 mg by mouth daily.   vancomycin 125 MG capsule Commonly known as: VANCOCIN 1 capsule 4 times daily for 5 days, then 1 capsule twice daily for 7 days, then 1 capsule daily for 7 days, then 1 capsule every other day for 7 days, then 1 capsule every 3 days until gone   VITAMIN D (CHOLECALCIFEROL) PO Take 5,000 Units by mouth daily.   ZZZQUIL PO Take 10 mLs by mouth daily as needed (sleep).        Objective BP 128/72 (BP Location: Left Arm, Patient Position: Sitting, Cuff Size: Normal)   Pulse (!) 137   Temp 98 F (36.7 C) (Oral)   Ht 5' 10"  (1.778 m)   Wt 205 lb  (93 kg)   SpO2 97%   BMI 29.41 kg/m  General Appearance:  awake, alert, oriented, in no acute distress and well developed, well nourished Skin:  there are no suspicious lesions or rashes of concern Mouth/Throat:  Mucosa dry, no lesions; pharynx without erythema, edema or exudate. Neck:  neck- supple, no mass, non-tender and no jvd Lungs: Clear to auscultation.  No rales, rhonchi, or wheezing. Normal effort, no accessory muscle use. Heart:  Heart sounds are normal.  Regular rhythm tachycardic.  4+ pitting lower extremity edema up to the knees bilaterally Abdomen:  BS+, soft, NT, distended, no masses or organomegaly Musculoskeletal:  No muscle group atrophy or asymmetry Psych exam: Nml mood and affect, age appropriate judgment and insight  C. difficile colitis  Acute deep vein thrombosis (DVT) of tibial vein of right lower extremity (HCC)  Hyponatremia  Bilateral lower extremity edema  Essential hypertension  1. Continue vancomycin. Appreciate GI input. He has f/u labs with them so I will avoid poking him here. 2. Cont Eliquis 5 mg bid for 3 mo for this first provoked DVT. 3. I think he is dry. Encouraged fluid intake of 55-60 oz of water (nearly double his current intake) daily.  4. Elevate legs, mind salt intake, compression, activity as tolerated. 5. Hold Exforge for now. Monitor BP at home. Send me readings in 2-3 weeks.  F/u in 3 mo for med ck.  The patient and his spouse voiced understanding and agreement to the plan.  Greater than 40 minutes were spent face to face with the patient and his spouse, reviewing his hospitalization, reviewing GI notes.   [place], DO 07/04/20 12:21 PM

## 2020-07-04 NOTE — Patient Instructions (Addendum)
If you are age 69 or older, your body mass index should be between 23-30. Your Body mass index is 29.73 kg/m. If this is out of the aforementioned range listed, please consider follow up with your Primary Care Provider.  If you are age 18 or younger, your body mass index should be between 19-25. Your Body mass index is 29.73 kg/m. If this is out of the aformentioned range listed, please consider follow up with your Primary Care Provider.   Your provider has requested that you go to the basement level for lab work before leaving today. Press "B" on the elevator. The lab is located at the first door on the left as you exit the elevator.  Push fluids 60-80 ounces daily.  Continue Reglan 30 minutes before meals try to wean off over the next 4-5 days.  Continue Vancomycin taper.  We have sent the following medications to your pharmacy for you to pick up at your convenience: Protonix 40 mg take   Thank you for entrusting me with your care and choosing [facility].  Amy Esterwood, PA-C

## 2020-07-04 NOTE — Telephone Encounter (Signed)
Ok, thanks for update

## 2020-07-05 ENCOUNTER — Telehealth: Payer: Self-pay | Admitting: Physician Assistant

## 2020-07-05 ENCOUNTER — Other Ambulatory Visit: Payer: Self-pay

## 2020-07-05 MED ORDER — DIFICID 200 MG PO TABS: 200.0000 mg | ORAL_TABLET | Freq: Two times a day (BID) | ORAL | 0 refills | Status: DC

## 2020-07-05 NOTE — Progress Notes (Signed)
Attending Physician's Attestation   I have reviewed the chart.   I agree with the Advanced Practitioner's note, impression, and recommendations with any updates as below. Not great to see leukocytosis recurring at this time.  Reasonable to try and get Dificid, although less data in patients with IBD for this, but if he is failing potentially.  Would plan continued Vancomycin taper at this time as outlined until Dificid is obtained (if financially feasible).  May require a flexible sigmoidoscopy to further evaluate things if issues persist.  Recommend repeat labs early next week Monday if feasible (I will be away the rest of the week) - CBC/CMP/ESR/CRP to see how things are going.   Justice Britain, MD [facility]oscopy Office # [phone]

## 2020-07-05 NOTE — Telephone Encounter (Signed)
Spoke with the spouse. She knows the providers are discussing the patient's case and will advise her soon.

## 2020-07-05 NOTE — Telephone Encounter (Signed)
Pt's spouse is requesting a call back from a nurse to discuss the pt's lab results.

## 2020-07-09 ENCOUNTER — Other Ambulatory Visit: Payer: Self-pay

## 2020-07-09 ENCOUNTER — Telehealth: Payer: Self-pay | Admitting: Physician Assistant

## 2020-07-09 DIAGNOSIS — R197 Diarrhea, unspecified: Secondary | ICD-10-CM

## 2020-07-09 DIAGNOSIS — A0472 Enterocolitis due to Clostridium difficile, not specified as recurrent: Secondary | ICD-10-CM

## 2020-07-09 DIAGNOSIS — I82409 Acute embolism and thrombosis of unspecified deep veins of unspecified lower extremity: Secondary | ICD-10-CM

## 2020-07-09 DIAGNOSIS — K51919 Ulcerative colitis, unspecified with unspecified complications: Secondary | ICD-10-CM

## 2020-07-09 DIAGNOSIS — K5180 Other ulcerative colitis without complications: Secondary | ICD-10-CM

## 2020-07-09 DIAGNOSIS — A021 Salmonella sepsis: Secondary | ICD-10-CM

## 2020-07-09 DIAGNOSIS — Z86718 Personal history of other venous thrombosis and embolism: Secondary | ICD-10-CM

## 2020-07-09 NOTE — Addendum Note (Signed)
Addended by: Virgina Evener A on: 07/09/2020 04:44 PM   Modules accepted: Orders

## 2020-07-09 NOTE — Telephone Encounter (Signed)
Inbound call from patient's wife requesting a call back.  Knows that patient spoke with nurse but wanting to inform of additional sxs patient is having.

## 2020-07-09 NOTE — Telephone Encounter (Signed)
Reviewed the patient's most recent labs. Concerns about a new issue she has noticed. The patient sounds like he is having hiccups or gasps frequently during the day. Once time it was bad enough that he could not speak or catch his breath. She also notes that she hears him burp and make noises similar during his sleep. He is able to walk around the house. He does not feel he is short of breath. He said "it is like it locks up."  He has stopped Vancomycin as of today. He is taking Dificid. No other changes in his medications. No change in his fluid intake. Diarrhea 4 times a day. Agrees to come early tomorrow for labs and return for an office visit at 1:30 pm. Appointment cleared with nurse supervisor.

## 2020-07-10 ENCOUNTER — Ambulatory Visit (INDEPENDENT_AMBULATORY_CARE_PROVIDER_SITE_OTHER): Payer: Medicare Other | Admitting: Physician Assistant

## 2020-07-10 ENCOUNTER — Encounter: Payer: Self-pay | Admitting: Physician Assistant

## 2020-07-10 ENCOUNTER — Other Ambulatory Visit (INDEPENDENT_AMBULATORY_CARE_PROVIDER_SITE_OTHER): Payer: Medicare Other

## 2020-07-10 VITALS — BP 122/66 | HR 104 | Ht 69.5 in | Wt 203.2 lb

## 2020-07-10 DIAGNOSIS — K51919 Ulcerative colitis, unspecified with unspecified complications: Secondary | ICD-10-CM

## 2020-07-10 DIAGNOSIS — R5383 Other fatigue: Secondary | ICD-10-CM | POA: Diagnosis not present

## 2020-07-10 DIAGNOSIS — R197 Diarrhea, unspecified: Secondary | ICD-10-CM | POA: Diagnosis not present

## 2020-07-10 DIAGNOSIS — R0602 Shortness of breath: Secondary | ICD-10-CM | POA: Diagnosis not present

## 2020-07-10 DIAGNOSIS — K5180 Other ulcerative colitis without complications: Secondary | ICD-10-CM

## 2020-07-10 DIAGNOSIS — A0472 Enterocolitis due to Clostridium difficile, not specified as recurrent: Secondary | ICD-10-CM | POA: Diagnosis not present

## 2020-07-10 DIAGNOSIS — A09 Infectious gastroenteritis and colitis, unspecified: Secondary | ICD-10-CM | POA: Diagnosis not present

## 2020-07-10 DIAGNOSIS — Z86718 Personal history of other venous thrombosis and embolism: Secondary | ICD-10-CM

## 2020-07-10 DIAGNOSIS — R066 Hiccough: Secondary | ICD-10-CM | POA: Diagnosis not present

## 2020-07-10 DIAGNOSIS — R769 Abnormal immunological finding in serum, unspecified: Secondary | ICD-10-CM

## 2020-07-10 DIAGNOSIS — A498 Other bacterial infections of unspecified site: Secondary | ICD-10-CM

## 2020-07-10 LAB — CBC WITH DIFFERENTIAL/PLATELET
Basophils Absolute: 0.1 10*3/uL (ref 0.0–0.1)
Basophils Relative: 0.5 % (ref 0.0–3.0)
Eosinophils Absolute: 0.2 10*3/uL (ref 0.0–0.7)
Eosinophils Relative: 1.7 % (ref 0.0–5.0)
HCT: 32.8 % — ABNORMAL LOW (ref 39.0–52.0)
Hemoglobin: 10.5 g/dL — ABNORMAL LOW (ref 13.0–17.0)
Lymphocytes Relative: 21.7 % (ref 12.0–46.0)
Lymphs Abs: 2.6 10*3/uL (ref 0.7–4.0)
MCHC: 31.9 g/dL (ref 30.0–36.0)
MCV: 80.8 fl (ref 78.0–100.0)
Monocytes Absolute: 1.2 10*3/uL — ABNORMAL HIGH (ref 0.1–1.0)
Monocytes Relative: 10.4 % (ref 3.0–12.0)
Neutro Abs: 7.8 10*3/uL — ABNORMAL HIGH (ref 1.4–7.7)
Neutrophils Relative %: 65.7 % (ref 43.0–77.0)
Platelets: 475 10*3/uL — ABNORMAL HIGH (ref 150.0–400.0)
RBC: 4.06 Mil/uL — ABNORMAL LOW (ref 4.22–5.81)
RDW: 18.7 % — ABNORMAL HIGH (ref 11.5–15.5)
WBC: 11.9 10*3/uL — ABNORMAL HIGH (ref 4.0–10.5)

## 2020-07-10 LAB — COMPREHENSIVE METABOLIC PANEL
ALT: 25 U/L (ref 0–53)
AST: 23 U/L (ref 0–37)
Albumin: 2.3 g/dL — ABNORMAL LOW (ref 3.5–5.2)
Alkaline Phosphatase: 67 U/L (ref 39–117)
BUN: 11 mg/dL (ref 6–23)
CO2: 28 mEq/L (ref 19–32)
Calcium: 7.8 mg/dL — ABNORMAL LOW (ref 8.4–10.5)
Chloride: 97 mEq/L (ref 96–112)
Creatinine, Ser: 0.94 mg/dL (ref 0.40–1.50)
GFR: 82.83 mL/min (ref >60.00)
Glucose, Bld: 166 mg/dL — ABNORMAL HIGH (ref 70–99)
Potassium: 4.9 mEq/L (ref 3.5–5.1)
Sodium: 131 mEq/L — ABNORMAL LOW (ref 135–145)
Total Bilirubin: 0.3 mg/dL (ref 0.2–1.2)
Total Protein: 5.5 g/dL — ABNORMAL LOW (ref 6.0–8.3)

## 2020-07-10 LAB — HIGH SENSITIVITY CRP: CRP, High Sensitivity: 42.35 mg/L — ABNORMAL HIGH (ref 0.000–5.000)

## 2020-07-10 NOTE — Progress Notes (Signed)
Subjective:    Patient ID: Justin Rosales, male    DOB: 02/25/1951, 69 y.o.   MRN: [id number]  HPI Justin Rosales is a pleasant 69 year old white male, now established with Dr. Rush Landmark, who comes in today for follow-up.  He was seen in the office on 07/04/2020 by myself in post hospital follow-up after recent hospitalization for refractory C. difficile colitis.  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 Patient has had a difficult course, was on oral vancomycin at home for about 2 weeks prior to hospitalization, was failing and met sepsis criteria on admission with tachycardia and fever.  He was continued on oral vancomycin 125 4 times daily and metronidazole 500 mg 3 times daily was added to his regimen.  He had very gradual improvement in symptoms was discharged on 06/27/2020 with plans for long taper course of vancomycin and 14 days total of Flagyl. Hospital course complicated by right lower extremity DVT, now on Eliquis. He had called the office with nausea and vomiting after discharge and has been started on a trial of Reglan. Repeat labs on 07/04/2020-with WBC 15.3, hemoglobin 10.9/hematocrit 34.5, CRP 4.4, sodium 131/potassium 4.8/creatinine 0.91 Happy with feeling well and continues to have diarrhea at least 4-6 times daily.  He was also complaining of some hiccups at that visit without complaint of heartburn or indigestion, no further vomiting.  Protonix 40 mg p.o. every morning was added to his regimen. When labs returned with increase in WBC decision was made to switch him to Dificid 200 mg p.o. twice daily x10 days.  This was started on 07/05/2020. He was also quite tachycardic at the last office visit with rate in the 120s.  He is brought back in today, on telephone conversation yesterday he has continued to have significant ongoing pickups and his wife said he had an episode yesterday while she was talking to him on the  phone where he seemed to be unable to get his breath for a few seconds.  He denies any exertional dyspnea.  No fevers or chills but continues to feel poorly and is quite fatigued.  He has been eating and is trying to push liquids.  Denies any abdominal pain is has been staying somewhat distended.  He feels that the Reglan and Protonix may have been making his hiccups worse and had stopped the Reglan and stop Protonix as of yesterday. He has had 4 bowel movements thus far today and continues to have at least 4 bowel movements every 12 hours, last bowel movement was "chunky" though he continues to have some better pure liquid and had an episode earlier this morning of incontinence of liquid stool.  This was the 1st time he had done that.  Weight is down 1 pound over the past week Vitals are stable today pulse down to 102, sat 96  Review of Systems Pertinent positive and negative review of systems were noted in the above HPI section.  All other review of systems was otherwise negative.  Outpatient Encounter Medications as of 07/10/2020  Medication Sig  . apixaban (ELIQUIS) 5 MG TABS tablet Take 1 tablet (42m) twice daily  . diphenhydrAMINE HCl, Sleep, (ZZZQUIL PO) Take 10 mLs by mouth daily as needed (sleep).  . Ferrous Sulfate (IRON) 325 (65 Fe) MG TABS Take 1 tablet by mouth daily.  . fidaxomicin (DIFICID) 200 MG TABS tablet Take 1 tablet (200 mg total) by mouth 2 (two) times daily.  .Marland Kitchen  folic acid (FOLVITE) 1 MG tablet TAKE 1 TABLET BY MOUTH  DAILY (Patient taking differently: Take 1 mg by mouth daily.)  . metoCLOPramide (REGLAN) 10 MG tablet Take 1 tablet (10 mg total) by mouth 3 (three) times daily.  . Multiple Vitamin (MULTIVITAMIN WITH MINERALS) TABS tablet Take 1 tablet by mouth daily.  . NON FORMULARY Take 1 tablet by mouth daily. Vital reds-energy supplement  . ondansetron (ZOFRAN-ODT) 4 MG disintegrating tablet Take 1 tablet (4 mg total) by mouth every 8 (eight) hours as needed for nausea or  vomiting.  . pantoprazole (PROTONIX) 40 MG tablet Take 1 tablet (40 mg total) by mouth daily before breakfast.  . Saccharomyces boulardii (FLORASTOR PO) Take 1 capsule by mouth in the morning and at bedtime.  . sulfaSALAzine (AZULFIDINE) 500 MG tablet TAKE 2 TABLETS BY MOUTH 3  TIMES DAILY (Patient taking differently: Take 1,000 mg by mouth 3 (three) times daily.)  . VITAMIN D, CHOLECALCIFEROL, PO Take 5,000 Units by mouth daily.  . [DISCONTINUED] Turmeric 500 MG TABS Take 1,000 mg by mouth daily.   No facility-administered encounter medications on file as of 07/10/2020.   No Known Allergies Patient Active Problem List   Diagnosis Date Noted  . DVT (deep venous thrombosis) ([place]) 06/25/2020  . Abdominal distension 06/25/2020  . Hyponatremia 06/25/2020  . C. difficile colitis 06/18/2020  . Anemia 06/18/2020  . Sepsis ([place]) 06/18/2020  . Obesity (BMI 30-39.9) 07/11/2019  . Ulcerative colitis with complication ([place]) 23/76/2831  . Essential hypertension 08/30/2018   Social History   Socioeconomic History  . Marital status: Married    Spouse name: Not on file  . Number of children: 2  . Years of education: Not on file  . Highest education level: Not on file  Occupational History  . Occupation: Emergency planning/management officer  Tobacco Use  . Smoking status: Former Research scientist (life sciences)  . Smokeless tobacco: Never Used  Vaping Use  . Vaping Use: Never used  Substance and Sexual Activity  . Alcohol use: Yes    Comment: ocassionally  . Drug use: Not Currently  . Sexual activity: Not on file  Other Topics Concern  . Not on file  Social History Narrative  . Not on file   Social Determinants of Health   Financial Resource Strain: Not on file  Food Insecurity: Not on file  Transportation Needs: Not on file  Physical Activity: Not on file  Stress: Not on file  Social Connections: Not on file  Intimate Partner Violence: Not on file    Mr. Minion family history includes Alzheimer's disease in his father  and mother; Diabetes in his mother; Hypertension in his mother; Parkinson's disease in his father.      Objective:    Vitals:   07/10/20 1333  BP: 122/66  Pulse: (!) 104    Physical Exam Well-developed, somewhat chronically ill-appearing older white male in no acute distress.  Accompanied by his wife height, Weight, 203, down 1 pound BMI 29.5  HEENT; nontraumatic normocephalic, EOMI, PE R LA, sclera anicteric. Oropharynx; no oral thrush evident on the tongue or buccal mucosa Neck; supple, no JVD Cardiovascular;tachy  regular rate and rhythm with S1-S2, no murmur rub or gallop Pulmonary; Clear bilaterally Abdomen; protuberant , nontender, somewhat distended with tympany bowel sounds somewhat high-pitched, no palpable mass or hepatosplenomegaly , no focal tenderness Rectal; not done Skin; benign exam, no jaundice rash or appreciable lesions Extremities; 1+ edema right lower extremity Neuro/Psych; alert and oriented x4, grossly nonfocal mood and affect  appropriate       Assessment & Plan:   #83 69 year old white male with prolonged refractory C. difficile colitis in setting of known pan ulcerative colitis which had been quiesced sent on Azulfidine. Patient had 2-week course of vancomycin prior to recent hospitalization.  Hospitalized 11/29 through 06/27/2021.  Continued on vancomycin 125 p.o. 4 times daily and also has taken a 14-day course of metronidazole. Patient had improved at time of discharge. He continue vancomycin taper. When seen in the office on 1215 continuing to feel poorly and WBC was back up to 15,000.  He completed the vancomycin twice daily x7 days then stopped and was started on Dificid 200 mg p.o. twice daily about 4 days ago. He continues to have at least 4 loose and sometimes liquid bowel movements per 12 hours and had an episode of incontinent liquid stool this morning and then another episode of semiformed stool.  No bleeding and no complaints of abdominal  pain.  He has persistent significant hiccups no improvement with Protonix or Reglan I am concerned that he has an ileus that may be contributing to the hiccups.  He is tolerating p.o.'s without difficulty. He is still struggling though not worse he has not had significant improvement and it is unclear if the underlying issue is still refractory C. difficile, consider exacerbation of colitis.  #2 recent right lower extremity DVT-on Eliquis.  Patient had significant tachycardia when seen on 07/04/2020, improved today and O2 sat okay.  CRP today up to 42. We will also rule out PE  Plan;   Labs were done earlier today as above CRP up to 42, WBC 11.9, hemoglobin 10.5 stable and electrolytes okay.    Will schedule for CT of the abdomen and pelvis as well as CT of the chest. Continue Eliquis Stop Reglan, already done Continue Dificid 200 mg p.o. twice daily to complete the 10-day course, patient is off vancomycin Would like him to continue Protonix 40 mg p.o. every morning.  Okay to use Mylanta or Maalox. He may require endoscopic evaluation with at least flex versus Colonoscopy  , and depending on results of CT possibly EGD. Continue to push liquids, protein shakes twice daily. Patient and wife been advised if he has any worsening in symptoms that he will require hospitalization and to proceed to the emergency room at [facility].  Patient is determined to stay out of the hospital. Further plans pending results of CTs.  Amy S Esterwood PA-C 07/10/2020   Cc: Shelda Pal*

## 2020-07-10 NOTE — Patient Instructions (Addendum)
If you are age 69 or older, your body mass index should be between 23-30. Your Body mass index is 29.58 kg/m. If this is out of the aforementioned range listed, please consider follow up with your Primary Care Provider.  If you are age 98 or younger, your body mass index should be between 19-25. Your Body mass index is 29.58 kg/m. If this is out of the aformentioned range listed, please consider follow up with your Primary Care Provider.   You have been scheduled for a CT scan at [facility] . You are scheduled on 07/11/2020  at 3:00 pm. You should arrive 15 minutes prior to your appointment time for registration.  The solution may taste better if refrigerated, but do NOT add ice or any other liquid to this solution. Shake well before drinking.   Please follow the written instructions below on the day of your exam:   1) Do not eat anything after 11:00 am (4 hours prior to your test)   2) Drink 1 bottle of contrast @ 1:00 pm (2 hours prior to your exam)  Remember to shake well before drinking and do NOT pour over ice.     Drink 1 bottle of contrast @ 2:00 pm (1 hour prior to your exam)   You may take any medications as prescribed with a small amount of water, if necessary. If you take any of the following medications: METFORMIN, GLUCOPHAGE, GLUCOVANCE, AVANDAMET, RIOMET, FORTAMET, [place] MET, JANUMET, GLUMETZA or METAGLIP, you MAY be asked to HOLD this medication 48 hours AFTER the exam.   The purpose of you drinking the oral contrast is to aid in the visualization of your intestinal tract. The contrast solution may cause some diarrhea. Depending on your individual set of symptoms, you may also receive an intravenous injection of x-ray contrast/dye. Plan on being at [facility] for 45 minutes or longer, depending on the type of exam you are having performed.   If you have any questions regarding your exam or if you need to reschedule, you may call [facility] at [phone]  between the hours of 8:00 am and 5:00 pm, Monday-Friday.   Continue Dificid  STOP Reglan Continue Pantoprazole 40 mg 1 tablet every morning Continue Sulfasalazine Continue Eliquis  Push fluids and drink protein shakes.  We will contact you with your results.  Thank you for entrusting me with your care and choosing [facility].  Amy Esterwood, PA-C

## 2020-07-11 ENCOUNTER — Encounter (HOSPITAL_COMMUNITY): Payer: Self-pay

## 2020-07-11 ENCOUNTER — Other Ambulatory Visit: Payer: Self-pay | Admitting: Physician Assistant

## 2020-07-11 ENCOUNTER — Ambulatory Visit (HOSPITAL_COMMUNITY)
Admission: RE | Admit: 2020-07-11 | Discharge: 2020-07-11 | Disposition: A | Discharge status: Home / Self Care | Payer: Medicare Other | Source: Ambulatory Visit | Attending: Physician Assistant | Admitting: Physician Assistant

## 2020-07-11 ENCOUNTER — Other Ambulatory Visit: Payer: Medicare Other

## 2020-07-11 ENCOUNTER — Telehealth: Payer: Self-pay | Admitting: Physician Assistant

## 2020-07-11 ENCOUNTER — Telehealth: Payer: Self-pay

## 2020-07-11 ENCOUNTER — Other Ambulatory Visit: Payer: Self-pay

## 2020-07-11 DIAGNOSIS — A498 Other bacterial infections of unspecified site: Secondary | ICD-10-CM | POA: Diagnosis present

## 2020-07-11 DIAGNOSIS — A09 Infectious gastroenteritis and colitis, unspecified: Secondary | ICD-10-CM | POA: Insufficient documentation

## 2020-07-11 DIAGNOSIS — R769 Abnormal immunological finding in serum, unspecified: Secondary | ICD-10-CM | POA: Diagnosis present

## 2020-07-11 DIAGNOSIS — R5383 Other fatigue: Secondary | ICD-10-CM

## 2020-07-11 DIAGNOSIS — R066 Hiccough: Secondary | ICD-10-CM | POA: Diagnosis present

## 2020-07-11 DIAGNOSIS — R0602 Shortness of breath: Secondary | ICD-10-CM | POA: Insufficient documentation

## 2020-07-11 MED ORDER — IOHEXOL 350 MG/ML SOLN
100.0000 mL | Freq: Once | INTRAVENOUS | Status: AC | PRN
  Administered 2020-07-11: 13:00:00 100 mL via INTRAVENOUS

## 2020-07-11 NOTE — Progress Notes (Signed)
Agree with the assessment and plan as outlined by Nicoletta Ba, PA-C.  -Has had no improvement in hiccups despite Protonix and Reglan.  Agree with stopping Reglan as nausea otherwise seems to be improved.  Could trial course of baclofen if hiccups not improving -Agree with antimicrobial therapy as outlined with Dificid.  May need to strongly consider fecal transplant given concern for ongoing infectious colitis despite prolonged antimicrobial therapy -Will follow up on [place], DO, [facility]

## 2020-07-11 NOTE — Telephone Encounter (Signed)
CT report is in the system.  Nicoletta Ba, PA and Dr Bryan Lemma are out of the office.  Doctor covering will call the patient with the interpretations of the CT.

## 2020-07-11 NOTE — Telephone Encounter (Signed)
Patient calling to discuss the CT results.

## 2020-07-11 NOTE — Telephone Encounter (Signed)
Called patient and discussed CT scan results with both him and his wife.  Nodular lesions in right upper and lower lobes possibly infectious or inflammatory, ?  Aspiration pneumonitis.  Patient was hospitalized recently for prolonged duration.  Denies any shortness of breath or cough or fever.  He has intermittent hiccups. Beth, please send referral to pulmonary for follow-up and evaluation  He has persistent diarrhea 6-7 bowel movements per day.  Findings of enterocolitis likely secondary to persistent C. difficile colitis.  No features of megacolon  He has 10 more days of Dificid treatment left.  I will forward message to Dr. Bryan Lemma, so he can explore fecal transplant for refractory C. difficile if patient continues to have persistent diarrhea with no improvement of symptoms.

## 2020-07-11 NOTE — Telephone Encounter (Signed)
Pt is requesting a call back from a nurse to discuss his CT scan results.

## 2020-07-12 ENCOUNTER — Other Ambulatory Visit: Payer: Self-pay

## 2020-07-12 ENCOUNTER — Ambulatory Visit (HOSPITAL_COMMUNITY): Payer: Medicare Other

## 2020-07-12 DIAGNOSIS — R0602 Shortness of breath: Secondary | ICD-10-CM

## 2020-07-12 DIAGNOSIS — R918 Other nonspecific abnormal finding of lung field: Secondary | ICD-10-CM

## 2020-07-12 MED ORDER — CHOLESTYRAMINE 4 G PO PACK: PACK | ORAL | 0 refills | Status: DC

## 2020-07-12 MED ORDER — BACLOFEN 5 MG PO TABS: ORAL_TABLET | ORAL | 0 refills | Status: DC

## 2020-07-12 NOTE — Telephone Encounter (Signed)
Referral to [facility] - urgent . Abnormal imaging.

## 2020-07-12 NOTE — Telephone Encounter (Signed)
I called the patient this morning and discussed his care at length with him and his wife by phone.  Reviewed the CT scan results.  Appreciate Dr. Silverio Decamp also reviewing these with the patient yesterday and placing the Pulmonary referral.  States that he is feeling a little better today.  Still with 5 stools/day, but improved consistency.  Still with hiccups, but no longer with nausea/vomiting for the last 2+ days.  Tolerating p.o. intake.  We discussed the inflammatory changes on the CT scan and lingering symptoms at length.  Difficult to parse out if this is ongoing infectious colitis vs exacerbation of underlying UC.  Discussed flexible sigmoidoscopy as a way to differentiate, but given clinical improvement, he elected to hold off on that for now.  Plan as follows:  -Please ensure that Pulmonary referral has been placed -Baclofen 5 mg tid for hiccups.  Can uptitrate to 10 mg tid as needed -Place Rx for Questran to decrease stool frequency.  Can start using if stools still not improving over the next 24 hours or so -If symptoms persist, plan for flexible sigmoidoscopy early next week.  If active inflammation but no pseudomembranes, may need to consider trial of Uceris -Similarly, discussed potential for fecal transplant if concern for ongoing infectious colitis.  I have already reached out to Infectious Disease and awaiting response re: availability of fecal transplant vs having to refer to larger academic center -Continue p.o. intake -Will loop in Dr. Rush Landmark as I will be away next week.  Also cc to Dr. Havery Moros for awareness as he is on-call for our service through the holiday weekend.  Patient is wife were appreciative of the phone call.  All questions answered.

## 2020-07-12 NOTE — Telephone Encounter (Signed)
Prescriptions transmitted to the [facility].

## 2020-07-12 NOTE — Telephone Encounter (Signed)
4 gm daily.  To separate this medication from other medications by at least 2 hours.  Thank you.

## 2020-07-12 NOTE — Telephone Encounter (Signed)
Please give SIG for the Questran. I will send the Rx for the Baclofen. Thanks

## 2020-07-16 ENCOUNTER — Telehealth: Payer: Self-pay | Admitting: Physician Assistant

## 2020-07-16 DIAGNOSIS — R197 Diarrhea, unspecified: Secondary | ICD-10-CM

## 2020-07-16 NOTE — Telephone Encounter (Signed)
Inbound call from patient's son stating patient has finished his Dificid medication and is doing a little better.  Is wanting to know where to go from here.  Please advise.

## 2020-07-16 NOTE — Telephone Encounter (Signed)
Got it. Thanks for update VC. GM

## 2020-07-17 NOTE — Telephone Encounter (Signed)
Patient called to follow up on previous message is requesting to speak with a nurse

## 2020-07-18 ENCOUNTER — Other Ambulatory Visit: Payer: Medicare Other

## 2020-07-18 NOTE — Telephone Encounter (Addendum)
Dr. Ardis Hughs as DOD on 07/18/20,  Dr. Vivia Ewing patient - history of C. Diff and Ulcerative colitis   Spoke with patient's wife, she states that patient has finished Dificid on Monday, today patient has had 5 episodes of diarrhea since this morning, wife states that patient says diarrhea can be watery at times and chunky at times, patient stopped Zofran - patient stated this seemed to make nausea worse, taking Baclofen at 10 mg has helped hiccups and spasms.Pt was taking Questran daily but  about an hour after taking it patient was vomiting. Wife states that patient is eating in very small portions - he has had a scrambled egg and little bit of chicken soup, pt has tried Ensure, Pedialyte and body armour. Wife states "it looks like he is just fading away". Wife states that last night patient had a fever of 101, took Tylenol and fever is gone and has not returned. Wife states if they are needing to be seen at an academic center that is fine. Wife is very concerned that this has been going on for so long and would like to know next steps in patient's care. Per telephone encounter note 07/12/20 Dr.Cirigliano indicated possible flex - okay to schedule early next week, we are unable to schedule in the [place] due to not knowing if recurrent C. Diff is causing diarrhea . Please advise on next steps, thank you.

## 2020-07-18 NOTE — Telephone Encounter (Signed)
He needs to be seen in the office to sort this all out, quite complicated.  There are several MD and extender openings for tomorrow from what I can see.  He needs CMET and C. Diff by toxin and C. Diff by PCR (preferably today if there is still time so that some of the results may be available by his appt time tomorrow.)

## 2020-07-18 NOTE — Telephone Encounter (Signed)
I spoke with the pt wife and she will have the pt come in tomorrow morning for labs and stool test.  Appt to see Amy tomorrow at 3 pm.  Labs entered and appt made.

## 2020-07-19 ENCOUNTER — Other Ambulatory Visit: Payer: Self-pay

## 2020-07-19 ENCOUNTER — Encounter: Payer: Self-pay | Admitting: Physician Assistant

## 2020-07-19 ENCOUNTER — Ambulatory Visit (INDEPENDENT_AMBULATORY_CARE_PROVIDER_SITE_OTHER): Payer: Medicare Other | Admitting: Physician Assistant

## 2020-07-19 ENCOUNTER — Other Ambulatory Visit (INDEPENDENT_AMBULATORY_CARE_PROVIDER_SITE_OTHER): Payer: Medicare Other

## 2020-07-19 VITALS — BP 130/68 | HR 119 | Ht 69.5 in | Wt 199.0 lb

## 2020-07-19 DIAGNOSIS — R531 Weakness: Secondary | ICD-10-CM

## 2020-07-19 DIAGNOSIS — R5383 Other fatigue: Secondary | ICD-10-CM

## 2020-07-19 DIAGNOSIS — K51919 Ulcerative colitis, unspecified with unspecified complications: Secondary | ICD-10-CM

## 2020-07-19 DIAGNOSIS — R197 Diarrhea, unspecified: Secondary | ICD-10-CM

## 2020-07-19 DIAGNOSIS — A0472 Enterocolitis due to Clostridium difficile, not specified as recurrent: Secondary | ICD-10-CM

## 2020-07-19 DIAGNOSIS — A498 Other bacterial infections of unspecified site: Secondary | ICD-10-CM

## 2020-07-19 DIAGNOSIS — R918 Other nonspecific abnormal finding of lung field: Secondary | ICD-10-CM

## 2020-07-19 LAB — CBC WITH DIFFERENTIAL/PLATELET
Basophils Absolute: 0 10*3/uL (ref 0.0–0.1)
Basophils Relative: 0.4 % (ref 0.0–3.0)
Eosinophils Absolute: 0.2 10*3/uL (ref 0.0–0.7)
Eosinophils Relative: 1.6 % (ref 0.0–5.0)
HCT: 29.9 % — ABNORMAL LOW (ref 39.0–52.0)
Hemoglobin: 9.4 g/dL — ABNORMAL LOW (ref 13.0–17.0)
Lymphocytes Relative: 43.4 % (ref 12.0–46.0)
Lymphs Abs: 5 10*3/uL — ABNORMAL HIGH (ref 0.7–4.0)
MCHC: 31.6 g/dL (ref 30.0–36.0)
MCV: 79.7 fl (ref 78.0–100.0)
Monocytes Absolute: 1.2 10*3/uL — ABNORMAL HIGH (ref 0.1–1.0)
Monocytes Relative: 10 % (ref 3.0–12.0)
Neutro Abs: 5.2 10*3/uL (ref 1.4–7.7)
Neutrophils Relative %: 44.6 % (ref 43.0–77.0)
Platelets: 558 10*3/uL — ABNORMAL HIGH (ref 150.0–400.0)
RBC: 3.75 Mil/uL — ABNORMAL LOW (ref 4.22–5.81)
RDW: 18.6 % — ABNORMAL HIGH (ref 11.5–15.5)
WBC: 11.6 10*3/uL — ABNORMAL HIGH (ref 4.0–10.5)

## 2020-07-19 LAB — COMPREHENSIVE METABOLIC PANEL
ALT: 15 U/L (ref 0–53)
AST: 15 U/L (ref 0–37)
Albumin: 2.2 g/dL — ABNORMAL LOW (ref 3.5–5.2)
Alkaline Phosphatase: 69 U/L (ref 39–117)
BUN: 12 mg/dL (ref 6–23)
CO2: 28 mEq/L (ref 19–32)
Calcium: 7.6 mg/dL — ABNORMAL LOW (ref 8.4–10.5)
Chloride: 99 mEq/L (ref 96–112)
Creatinine, Ser: 0.75 mg/dL (ref 0.40–1.50)
GFR: 92.19 mL/min (ref >60.00)
Glucose, Bld: 150 mg/dL — ABNORMAL HIGH (ref 70–99)
Potassium: 4.6 mEq/L (ref 3.5–5.1)
Sodium: 133 mEq/L — ABNORMAL LOW (ref 135–145)
Total Bilirubin: 0.2 mg/dL (ref 0.2–1.2)
Total Protein: 5.5 g/dL — ABNORMAL LOW (ref 6.0–8.3)

## 2020-07-19 LAB — C-REACTIVE PROTEIN: CRP: 6.9 mg/dL (ref 0.5–20.0)

## 2020-07-19 NOTE — Progress Notes (Signed)
Subjective:    Patient ID: Justin Rosales, male    DOB: 11-21-1950, 69 y.o.   MRN: [id number]  HPI Justin Rosales is a pleasant 69 year old white male, now established with Dr. Rush Landmark , with history of pan ulcerative colitis which had been quiesced sent on Azulfidine.  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 Patient had 2-week course of vancomycin prior to recent hospitalization and then hospitalized 11/29 through 06/27/2021, at that time continued on oral vancomycin 125 mg 4 times daily and was also given a 14-day course of metronidazole. He had had improvement in symptoms at the time of discharge and was continued on vancomycin taper. He was seen in the office on 07/04/2020 continuing to feel poorly, WBC back up to 15,000 and was completing the vancomycin taper.  He was started on Dificid 200 mg p.o. twice daily for a 10-day course. He was seen back on 07/10/2020 continuing to feel weak and poorly in general and having at least 4 loose to liquid bowel movements every 12 hours intermittent episodes of incontinent stool and at that time no complaints of bleeding or abdominal pain.  He was also having significant hiccups with no real improvement on Protonix and a trial of metoclopramide.  There was concern for underlying ileus.  Metoclopramide was discontinued.  He underwent CT of the abdomen and pelvis on 07/11/2020 with finding of some nodular lesions in the right upper and right lower lobe infectious versus inflammatory, it was mild jejunal wall thickening and thickening of the distal transverse and proximal sigmoid colon suggestive of slightly progressive infectious or inflammatory enterocolitis. He has since been started on a course of baclofen which has definitely helped to resolve the ongoing hiccups. He completed the course of Dificid on 07/16/2020. He continues to feel poorly in general with severe fatigue.  Appetite not  great but he has been trying to eat, has been drinking protein supplements at least twice daily and has been pushing fluids.  His weight is down about 4 pounds since his last office visit.  His wife is very concerned about significant fatigue and some lethargy. Fever to 101 on 07/16/2020, intermittent low-grade since Labs were repeated today showing sodium of 133, potassium 4.6, creatinine 0.75 albumin 2.2, CRP down to 6.9 WBC 11.6, hemoglobin 9.4 hematocrit of 29 platelets 558.  Patient also has had an acute right lower extremity DVT for which she is on Eliquis. Referral has been placed for pulmonary consultation. Review of Systems Pertinent positive and negative review of systems were noted in the above HPI section.  All other review of systems was otherwise negative.  Outpatient Encounter Medications as of 07/19/2020  Medication Sig  . apixaban (ELIQUIS) 5 MG TABS tablet Take 1 tablet (14m) twice daily  . Baclofen 5 MG TABS 1 tablet three times daily as needed May increase to 10 mg tid if needed  . diphenhydrAMINE HCl, Sleep, (ZZZQUIL PO) Take 10 mLs by mouth daily as needed (sleep).  . Ferrous Sulfate (IRON) 325 (65 Fe) MG TABS Take 1 tablet by mouth daily.  . folic acid (FOLVITE) 1 MG tablet TAKE 1 TABLET BY MOUTH  DAILY (Patient taking differently: Take 1 mg by mouth daily.)  . Multiple Vitamin (MULTIVITAMIN WITH MINERALS) TABS tablet Take 1 tablet by mouth daily.  . NON FORMULARY Take 1 tablet by mouth daily. Vital reds-energy supplement  . ondansetron (ZOFRAN-ODT) 4 MG disintegrating tablet Take 1 tablet (4 mg  total) by mouth every 8 (eight) hours as needed for nausea or vomiting.  . Saccharomyces boulardii (FLORASTOR PO) Take 1 capsule by mouth in the morning and at bedtime.  . sulfaSALAzine (AZULFIDINE) 500 MG tablet TAKE 2 TABLETS BY MOUTH 3  TIMES DAILY (Patient taking differently: Take 1,000 mg by mouth 3 (three) times daily.)  . VITAMIN D, CHOLECALCIFEROL, PO Take 5,000 Units by  mouth daily.  . pantoprazole (PROTONIX) 40 MG tablet Take 1 tablet (40 mg total) by mouth daily before breakfast. (Patient not taking: Reported on 07/19/2020)  . [DISCONTINUED] cholestyramine (QUESTRAN) 4 g packet Take once daily for diarrhea Separate from other medicines by 2 hours (Patient not taking: Reported on 07/19/2020)  . [DISCONTINUED] fidaxomicin (DIFICID) 200 MG TABS tablet Take 1 tablet (200 mg total) by mouth 2 (two) times daily. (Patient not taking: Reported on 07/19/2020)  . [DISCONTINUED] metoCLOPramide (REGLAN) 10 MG tablet Take 1 tablet (10 mg total) by mouth 3 (three) times daily. (Patient not taking: Reported on 07/19/2020)   No facility-administered encounter medications on file as of 07/19/2020.   No Known Allergies Patient Active Problem List   Diagnosis Date Noted  . DVT (deep venous thrombosis) ([place]) 06/25/2020  . Abdominal distension 06/25/2020  . Hyponatremia 06/25/2020  . C. difficile colitis 06/18/2020  . Anemia 06/18/2020  . Sepsis ([place]) 06/18/2020  . Obesity (BMI 30-39.9) 07/11/2019  . Ulcerative colitis with complication ([place]) 98/33/8250  . Essential hypertension 08/30/2018   Social History   Socioeconomic History  . Marital status: Married    Spouse name: Not on file  . Number of children: 2  . Years of education: Not on file  . Highest education level: Not on file  Occupational History  . Occupation: Emergency planning/management officer  Tobacco Use  . Smoking status: Former Research scientist (life sciences)  . Smokeless tobacco: Never Used  Vaping Use  . Vaping Use: Never used  Substance and Sexual Activity  . Alcohol use: Yes    Comment: ocassionally  . Drug use: Not Currently  . Sexual activity: Not on file  Other Topics Concern  . Not on file  Social History Narrative  . Not on file   Social Determinants of Health   Financial Resource Strain: Not on file  Food Insecurity: Not on file  Transportation Needs: Not on file  Physical Activity: Not on file  Stress: Not on file   Social Connections: Not on file  Intimate Partner Violence: Not on file    Mr. Koranda family history includes Alzheimer's disease in his father and mother; Diabetes in his mother; Hypertension in his mother; Parkinson's disease in his father.      Objective:    Vitals:   07/19/20 1456  BP: 130/68  Pulse: (!) 119  SpO2: 97%    Physical Exam Well-developed fatigued appearing older white male in no acute distress.  Accompanied by his wife height, Weight,199  BMI28   HEENT; nontraumatic normocephalic, EOMI, PE R LA, sclera anicteric. Oropharynx; not examined Neck; supple, no JVD Cardiovascular; tach y regular rate and rhythm with S1-S2, no murmur rub or gallop Pulmonary; Clear bilaterally Abdomen; soft, , protuberant, mildly distended bowel sounds present and somewhat high-pitched no palpable mass or hepatosplenomegaly, no significant tenderness Rectal; not done today Skin; benign exam, no jaundice rash or appreciable lesions Extremities; no clubbing cyanosis or edema skin warm and dry Neuro/Psych; alert and oriented x4, grossly nonfocal mood and affect flat       Assessment & Plan:   #81 69 year old  white male with history of pan ulcerative colitis which had been in remission, diagnosed with C. difficile colitis 06/05/2020, and had been symptomatic for 3 to 4 weeks prior to that diagnosis.  Patient has been diagnosed with refractory C. difficile colitis.  He has completed a tapered course of vancomycin with persistence of symptoms, and has now completed a 10-day course of Dificid. Diarrhea has improved since onset of illness now having about 7 bowel movements per day, however patient continues to have diarrhea, and has failure to thrive with severe fatigue, and weight loss. CT of the chest abdomen and pelvis on 07/11/2020 no evidence of pulmonary embolus, he does have nodular lesions in the right upper lobe and right lower lobe infectious versus inflammatory and pulmonary  consultation is pending. There are persistent inflammatory changes of the distal transverse and proximal sigmoid colon as well as some adjacent jejunal wall thickening.  It is not clear at this time whether symptoms are secondary to persistent refractory C. difficile versus exacerbation of ulcerative colitis, versus component of both.  #2 anemia stable #3 right lower extremity DVT-on Eliquis #4 hiccups-improved on baclofen  Plan; Stool C. difficile PCR was submitted today, will await results. Due to generalized failure to thrive offered hospitalization which patient continues to want to try to avoid.  As labs are relatively stable today this is reasonable, though advised that he may require hospitalization especially if stool for C. difficile returns positive. He is to continue to push fluids and protein supplements over the weekend. If he has any worsening of symptoms or recurrent fever they are advised to proceed to the emergency room, for admission.  Questran was discontinued as he was intolerant Continue baclofen 5 mg p.o. 3 times daily Continue Protonix 40 mg every morning Continue Azulfidine 500 mg 2 p.o. 3 times daily Continue Zofran 4 mg every 6 hours as needed  Patient needs flexible sigmoidoscopy/colonoscopy for diagnostic purposes, to help differentiate persistent C. difficile versus active ulcerative colitis. If stool for C. difficile is negative we have a tentative spot reserved with Dr. Rush Landmark on 07/27/2019 in the [facility] at 4 PM.  If C. difficile is negative we will also plan to initiate prednisone 40 mg p.o. daily. If C. difficile remains positive, patient will need to be scheduled for colonoscopy at the hospital next week and this will likely need to be accomplished by admitting him. I have discussed availability of fecal transplant with Dr. Carlean Purl, still no availability in [place] and if we get to that point will need to be done at [facility]  Alvon Nygaard S Avaleigh Decuir  PA-C 07/19/2020   Cc: Shelda Pal*

## 2020-07-19 NOTE — Patient Instructions (Addendum)
If you are age 69 or older, your body mass index should be between 23-30. Your Body mass index is 28.97 kg/m. If this is out of the aforementioned range listed, please consider follow up with your Primary Care Provider.  If you are age 58 or younger, your body mass index should be between 19-25. Your Body mass index is 28.97 kg/m. If this is out of the aformentioned range listed, please consider follow up with your Primary Care Provider.   You may take 1-2 Imodium per day  Continue Baclofen 5 mg three times daily Continue Pantoprazole 40 mg daily  Continue Eliquis 5 mg Continue Sulfasalazine 500 mg 2 twice daily  Our office will be in touch with you on Monday.  If you feel like you are worsening over the weekend then go to the ER.  Thank you for entrusting me with your care and choosing [facility]  Amy Esterwood, PA-C

## 2020-07-19 NOTE — Progress Notes (Signed)
Orders added for CBC and CRP to be drawn with CMET and C diff today.

## 2020-07-20 LAB — CLOSTRIDIUM DIFFICILE TOXIN B, QUALITATIVE, REAL-TIME PCR: Toxigenic C. Difficile by PCR: NOT DETECTED

## 2020-07-22 NOTE — Progress Notes (Signed)
Attending Physician's Attestation   I have reviewed the chart.   I agree with the Advanced Practitioner's note, impression, and recommendations with any updates as below. If C. difficile is negative then colonoscopy for diagnostic purposes to differentiate potentially persistently active UC will be performed.  If C. difficile positive I still think a colonoscopy will be necessary and thus that will likely need to be performed in the hospital-based setting, which I will not have active outpatient status for so he may end up needing to be admitted.  We will see what his C. difficile PCR test shows.   Justice Britain, MD [facility] Advanced Endoscopy Office # [phone]

## 2020-07-23 ENCOUNTER — Telehealth: Payer: Self-pay

## 2020-07-23 ENCOUNTER — Telehealth: Payer: Self-pay | Admitting: Gastroenterology

## 2020-07-23 ENCOUNTER — Other Ambulatory Visit: Payer: Self-pay

## 2020-07-23 DIAGNOSIS — R1013 Epigastric pain: Secondary | ICD-10-CM

## 2020-07-23 DIAGNOSIS — K51019 Ulcerative (chronic) pancolitis with unspecified complications: Secondary | ICD-10-CM

## 2020-07-23 NOTE — Telephone Encounter (Signed)
Last saw Amy please route accordingly.  Thank you

## 2020-07-23 NOTE — Telephone Encounter (Signed)
Dr Rush Landmark,  [place], [place] contacted me about scheduling him. [place] held time for the patient in the [facility]. So I have spoken with the patient and the spouse.  They are pleased with the negative C Diff and ask if he should start prednisone. Please advise.

## 2020-07-23 NOTE — Telephone Encounter (Signed)
Stop 2 days before procedure. Ty.

## 2020-07-23 NOTE — Progress Notes (Signed)
Patient and spouse contacted. Agrees to this plan. He has been vaccinated against COVID. EGD/colon 07/26/20.

## 2020-07-23 NOTE — Telephone Encounter (Signed)
Pt's wife is requesting a call back from a nurse to discuss scheduling an urgent colonoscopy for the pt.

## 2020-07-23 NOTE — Telephone Encounter (Signed)
Thanks for update. GM

## 2020-07-23 NOTE — Telephone Encounter (Signed)
Patient is aware. He has a prescription of prednisone 10 mg in the home already and will use these. He will let us know when he needs more. Understands he is not to stop the prednisone abruptly.

## 2020-07-23 NOTE — Telephone Encounter (Signed)
Pre-operative Risk Assessment     Request for surgical clearance:     Endoscopy Procedure  What type of surgery is being performed?      Colonoscopy and EGD  When is this surgery scheduled?      Urgently 07/26/20  What type of clearance is required ?    Pharmacy  Are there any medications that need to be held prior to surgery and how long?  Apixaban (Eliquis)  Practice name and name of physician performing surgery?       [facility]  What is your office phone and fax number?       Phone- [phone]  [phone]  Anesthesia type (None, local, MAC, general) ?        MAC

## 2020-07-23 NOTE — Telephone Encounter (Signed)
-----   Message from Alfredia Ferguson, PA-C sent at 07/23/2020  2:07 PM EST ----- Regarding: Leilani Merl - I would also like to start pt on Prednisone  40 mg po qam  now   - can send 20 mg tabs - take 2 po qam x 2 weeks , then decrease to 30 mg po daily  x 2 weeks thens 20 mg daily   # 100/ 2

## 2020-07-24 NOTE — Telephone Encounter (Signed)
Patient contacted and instructed.  Hold Eliquis today and tomorrow. Dr Rush Landmark will tell the patient when to resume the medication after the procedure on 07/26/20.

## 2020-07-26 ENCOUNTER — Other Ambulatory Visit: Payer: Self-pay | Admitting: Gastroenterology

## 2020-07-26 ENCOUNTER — Ambulatory Visit (AMBULATORY_SURGERY_CENTER): Payer: 59 | Admitting: Gastroenterology

## 2020-07-26 ENCOUNTER — Encounter: Payer: Self-pay | Admitting: Gastroenterology

## 2020-07-26 ENCOUNTER — Other Ambulatory Visit: Payer: Self-pay

## 2020-07-26 VITALS — BP 146/85 | HR 88 | Temp 97.7°F | Resp 16 | Ht 69.0 in | Wt 199.0 lb

## 2020-07-26 DIAGNOSIS — K51919 Ulcerative colitis, unspecified with unspecified complications: Secondary | ICD-10-CM

## 2020-07-26 DIAGNOSIS — K319 Disease of stomach and duodenum, unspecified: Secondary | ICD-10-CM | POA: Diagnosis not present

## 2020-07-26 DIAGNOSIS — B3781 Candidal esophagitis: Secondary | ICD-10-CM

## 2020-07-26 DIAGNOSIS — K2970 Gastritis, unspecified, without bleeding: Secondary | ICD-10-CM

## 2020-07-26 DIAGNOSIS — R197 Diarrhea, unspecified: Secondary | ICD-10-CM

## 2020-07-26 MED ORDER — SODIUM CHLORIDE 0.9 % IV SOLN: 500.0000 mL | Freq: Once | INTRAVENOUS | Status: DC

## 2020-07-26 MED ORDER — FLUCONAZOLE 100 MG PO TABS: 100.0000 mg | ORAL_TABLET | Freq: Every day | ORAL | 0 refills | Status: DC

## 2020-07-26 NOTE — Progress Notes (Unsigned)
Called to room to assist during endoscopic procedure.  Patient ID and intended procedure confirmed with present staff. Received instructions for my participation in the procedure from the performing physician.  Stool sample collected and sent to lab to test for c-diff per Dr Mansouraty's order.

## 2020-07-26 NOTE — Op Note (Addendum)
[facility] Patient Name: Justin Rosales Procedure Date: 07/26/2020 4:55 PM MRN: [id number] Endoscopist: Justice Britain , MD Age: 70 Referring MD:  Date of Birth: [birth year] Gender: Male Account #: [id number] Procedure:                Colonoscopy Indications:              Generalized abdominal pain, Chronic diarrhea,                            Left-sided chronic ulcerative colitis previously                            stable, Follow-up of left-sided chronic ulcerative                            colitis, Assess therapeutic response to therapy of                            left-sided chronic ulcerative colitis after recent                            recurrent Clostridium difficile colitis (requiring                            Vancomycin then Vancomycin Taper and then Dificid                            therapies) - though negative repeat C. difficile                            PCR last week Medicines:                Monitored Anesthesia Care Procedure:                Pre-Anesthesia Assessment:                           - Prior to the procedure, a History and Physical                            was performed, and patient medications and                            allergies were reviewed. The patient's tolerance of                            previous anesthesia was also reviewed. The risks                            and benefits of the procedure and the sedation                            options and risks were discussed with the patient.  All questions were answered, and informed consent                            was obtained. Prior Anticoagulants: The patient has                            taken Eliquis (apixaban), last dose was 2 days                            prior to procedure. ASA Grade Assessment: III - A                            patient with severe systemic disease. After                            reviewing the risks and benefits, the  patient was                            deemed in satisfactory condition to undergo the                            procedure.                           After obtaining informed consent, the colonoscope                            was passed under direct vision. Throughout the                            procedure, the patient's blood pressure, pulse, and                            oxygen saturations were monitored continuously. The                            Olympus CF-[id number] ([id number]) Colonoscope was                            introduced through the anus and advanced to the the                            cecum, identified by appendiceal orifice and                            ileocecal valve. The colonoscopy was performed                            without difficulty. The patient tolerated the                            procedure. The quality of the bowel preparation was  adequate. The ileocecal valve, appendiceal orifice,                            and rectum were photographed. Scope In: 5:06:26 PM Scope Out: 5:26:09 PM Scope Withdrawal Time: 0 hours 14 minutes 30 seconds  Total Procedure Duration: 0 hours 19 minutes 43 seconds  Findings:                 The digital rectal exam findings include                            hemorrhoids. Pertinent negatives include no                            palpable rectal lesions.                           A moderate amount of semi-liquid stool was found in                            the entire colon, interfering with visualization.                            Lavage of the area was performed using copious                            amounts, resulting in clearance with adequate                            visualization.                           Normal mucosa was found in the transverse colon, in                            the ascending colon and in the cecum. Biopsies were                            taken with a cold forceps for  histology from the                            right colon.                           Inflammation was found in a continuous and                            circumferential pattern from the anus to the                            splenic flexure. This was graded as Mayo Score 3                            (severe, with spontaneous bleeding, ulcerations),  and when compared to the previous examination, the                            findings are worsened. There were some areas that                            were not completely consistent with pseudomembranes                            as they flushed/lavaged away at times but with                            recent C. difficile infection (even though PCR                            negative last week), fluid aspiration for                            Clostridium difficile was performed. Biopsies were                            taken with a cold forceps for histology from the                            left colon. Biopsies were taken with a cold forceps                            for histology from the rectum.                           Non-bleeding non-thrombosed external and internal                            hemorrhoids were found during perianal exam and                            during digital exam. The hemorrhoids were Grade II                            (internal hemorrhoids that prolapse but reduce                            spontaneously). Retroflexion not performed due to                            extent of inflammation in rectum. Complications:            No immediate complications. Estimated Blood Loss:     Estimated blood loss was minimal. Impression:               - Hemorrhoids found on digital rectal exam.                           - Stool in the entire examined colon.                           -  Normal mucosa in the transverse colon, in the                            ascending colon and in the cecum.  Biopsied.                           - Severe (Mayo Score 3) ulcerative colitis,                            worsened since the last examination. Fluid                            aspiration for C. difficile performed and Biopsied.                           - Non-bleeding non-thrombosed external and internal                            hemorrhoids. Recommendation:           - Proceed to scheduled EGD.                           - Will need to obtain the following labs in the                            next few days: HAV IgM, HAV IgG, HBsAb, HBsAg,                            HBcAb IgG, VZV IgG, CMV PCR, Quantiferon Gold,                            Prometheus TPMT Level, Vitamin D Level, ESR, CRP.                           - Will need to obtain Fecal Calprotectin at some                            point in future                           - Anticipate the likelihood of needing biologic                            therapy with Immunomodulation in very near future.                            Will need to discuss Remicade vs Humira most likely.                           - Repeat colonoscopy (date not yet determined) to                            check healing.                           -  Follow up in clinic to be made within the next                            2-weeks.                           - Continue Prednisone 40 mg daily for now without                            plan for tapering until decision made as to                            Biologic agent we will be considering.                           - Will need to accept the risk that Quantiferon                            Gold could be falsely negative due to the                            Prednisone use currently.                           - The findings and recommendations were discussed                            with the patient.                           - The findings and recommendations were discussed                            with the  patient's family. Justice Britain, MD 07/26/2020 5:54:55 PM

## 2020-07-26 NOTE — Progress Notes (Signed)
PT taken to PACU. Monitors in place. VSS. Report given to RN. 

## 2020-07-26 NOTE — Progress Notes (Signed)
Pt's states no medical or surgical changes since previsit or office visit. 

## 2020-07-26 NOTE — Op Note (Addendum)
[facility] Patient Name: Zeus Marquis Procedure Date: 07/26/2020 5:30 PM MRN: [id number] Endoscopist: Justice Britain , MD Age: 70 Referring MD:  Date of Birth: 10/04/1950 Gender: Male Account #: [id number] Procedure:                Upper GI endoscopy Indications:              Generalized abdominal pain, Iron deficiency anemia Medicines:                Monitored Anesthesia Care Procedure:                Pre-Anesthesia Assessment:                           - Prior to the procedure, a History and Physical                            was performed, and patient medications and                            allergies were reviewed. The patient's tolerance of                            previous anesthesia was also reviewed. The risks                            and benefits of the procedure and the sedation                            options and risks were discussed with the patient.                            All questions were answered, and informed consent                            was obtained. Prior Anticoagulants: The patient has                            taken Eliquis (apixaban), last dose was 2 days                            prior to procedure. ASA Grade Assessment: III - A                            patient with severe systemic disease. After                            reviewing the risks and benefits, the patient was                            deemed in satisfactory condition to undergo the                            procedure.  After obtaining informed consent, the endoscope was                            passed under direct vision. Throughout the                            procedure, the patient's blood pressure, pulse, and                            oxygen saturations were monitored continuously. The                            Endoscope was introduced through the mouth, and                            advanced to the second part of duodenum.  The upper                            GI endoscopy was accomplished without difficulty.                            The patient tolerated the procedure. Scope In: Scope Out: Findings:                 Diffuse, white plaques were found in the entire                            esophagus. Biopsies were taken with a cold forceps                            for histology.                           The Z-line was regular and was found 43 cm from the                            incisors.                           Patchy mildly erythematous mucosa without bleeding                            was found in the entire examined stomach.                           No other gross lesions were noted in the entire                            examined stomach. Biopsies were taken with a cold                            forceps for histology and Helicobacter pylori                            testing.  No gross lesions were noted in the duodenal bulb,                            in the first portion of the duodenum and in the                            second portion of the duodenum. Biopsies for                            histology were taken with a cold forceps for                            evaluation of celiac disease. Complications:            No immediate complications. Estimated Blood Loss:     Estimated blood loss was minimal. Impression:               - Esophageal plaques were found, suspicious for                            candidiasis. Biopsied.                           - Z-line regular, 43 cm from the incisors.                           - Erythematous mucosa in the stomach. No other                            gross lesions in the stomach. Biopsied.                           - No gross lesions in the duodenal bulb, in the                            first portion of the duodenum and in the second                            portion of the duodenum. Biopsied. Recommendation:            - The patient will be observed post-procedure,                            until all discharge criteria are met.                           - Discharge patient to home.                           - Patient has a contact number available for                            emergencies. The signs and symptoms of potential  delayed complications were discussed with the                            patient. Return to normal activities tomorrow.                            Written discharge instructions were provided to the                            patient.                           - Resume previous diet.                           - Await pathology results.                           - Start Fluconazole (400 mg Day 1 and 200 mg Day                            2-14).                           - May restart Eliquis on 07/28/20 PM - 48 hours from                            now to decrease risk of post-interventional                            bleeding.                           - Continue present medications.                           - Will consider potential repeat EGD to evaluate                            for healing of esophagitis in future (query 3-4                            months).                           - Recommend consideration of restarting PPI after                            pathology returns to at least 20 mg daily.                            Understandable about holding this in setting of                            recent significant C. difficile infection but will  need to consider based on pathology of gastric                            mucosa.                           - The findings and recommendations were discussed                            with the patient.                           - The findings and recommendations were discussed                            with the patient's family. Justice Britain, MD 07/26/2020  5:44:34 PM

## 2020-07-26 NOTE — Patient Instructions (Addendum)
YOU HAD AN ENDOSCOPIC PROCEDURE TODAY AT [facility]:   Refer to the procedure report that was given to you for any specific questions about what was found during the examination.  If the procedure report does not answer your questions, please call your gastroenterologist to clarify.  If you requested that your care partner not be given the details of your procedure findings, then the procedure report has been included in a sealed envelope for you to review at your convenience later.  YOU SHOULD EXPECT: Some feelings of bloating in the abdomen. Passage of more gas than usual.  Walking can help get rid of the air that was put into your GI tract during the procedure and reduce the bloating. If you had a lower endoscopy (such as a colonoscopy or flexible sigmoidoscopy) you may notice spotting of blood in your stool or on the toilet paper. If you underwent a bowel prep for your procedure, you may not have a normal bowel movement for a few days.  **Handout given on Gastritis**  Please Note:  You might notice some irritation and congestion in your nose or some drainage.  This is from the oxygen used during your procedure.  There is no need for concern and it should clear up in a day or so.  SYMPTOMS TO REPORT IMMEDIATELY:   Following lower endoscopy (colonoscopy or flexible sigmoidoscopy):  Excessive amounts of blood in the stool  Significant tenderness or worsening of abdominal pains  Swelling of the abdomen that is new, acute  Fever of 100F or higher   Following upper endoscopy (EGD)  Vomiting of blood or coffee ground material  New chest pain or pain under the shoulder blades  Painful or persistently difficult swallowing  New shortness of breath  Fever of 100F or higher  Black, tarry-looking stools  For urgent or emergent issues, a gastroenterologist can be reached at any hour by calling [phone]. Do not use MyChart messaging for urgent concerns.    DIET:  We do  recommend a small meal at first, but then you may proceed to your regular diet.  Drink plenty of fluids but you should avoid alcoholic beverages for 24 hours.  ACTIVITY:  You should plan to take it easy for the rest of today and you should NOT DRIVE or use heavy machinery until tomorrow (because of the sedation medicines used during the test).    FOLLOW UP: Our staff will call the number listed on your records 48-72 hours following your procedure to check on you and address any questions or concerns that you may have regarding the information given to you following your procedure. If we do not reach you, we will leave a message.  We will attempt to reach you two times.  During this call, we will ask if you have developed any symptoms of COVID 19. If you develop any symptoms (ie: fever, flu-like symptoms, shortness of breath, cough etc.) before then, please call [phone].  If you test positive for Covid 19 in the 2 weeks post procedure, please call and report this information to [place].    If any biopsies were taken you will be contacted by phone or by letter within the next 1-3 weeks.  Please call us at [phone] if you have not heard about the biopsies in 3 weeks.    SIGNATURES/CONFIDENTIALITY: You and/or your care partner have signed paperwork which will be entered into your electronic medical record.  These signatures attest to the fact that  that the information above on your After Visit Summary has been reviewed and is understood.  Full responsibility of the confidentiality of this discharge information lies with you and/or your care-partner.

## 2020-07-27 ENCOUNTER — Other Ambulatory Visit: Payer: Self-pay | Admitting: Gastroenterology

## 2020-07-27 ENCOUNTER — Other Ambulatory Visit: Payer: Self-pay

## 2020-07-27 ENCOUNTER — Telehealth: Payer: Self-pay | Admitting: Gastroenterology

## 2020-07-27 ENCOUNTER — Other Ambulatory Visit (INDEPENDENT_AMBULATORY_CARE_PROVIDER_SITE_OTHER): Payer: 59

## 2020-07-27 ENCOUNTER — Telehealth: Payer: Self-pay

## 2020-07-27 DIAGNOSIS — R197 Diarrhea, unspecified: Secondary | ICD-10-CM

## 2020-07-27 DIAGNOSIS — K51919 Ulcerative colitis, unspecified with unspecified complications: Secondary | ICD-10-CM

## 2020-07-27 LAB — VITAMIN D 25 HYDROXY (VIT D DEFICIENCY, FRACTURES): VITD: 30.36 ng/mL (ref 30.00–100.00)

## 2020-07-27 LAB — HIGH SENSITIVITY CRP: CRP, High Sensitivity: 37.47 mg/L — ABNORMAL HIGH (ref 0.000–5.000)

## 2020-07-27 LAB — SEDIMENTATION RATE: Sed Rate: 92 mm/hr — ABNORMAL HIGH (ref 0–20)

## 2020-07-27 LAB — C.DIFFICILE TOXIN: C. Difficile Toxin A: NOT DETECTED

## 2020-07-27 MED ORDER — PREDNISONE 20 MG PO TABS: 40.0000 mg | ORAL_TABLET | Freq: Every day | ORAL | 0 refills | Status: DC

## 2020-07-27 NOTE — Telephone Encounter (Signed)
-----  Message from Irving Copas., MD sent at 07/27/2020  5:03 AM EST ----- Regarding: Labs needed Justin Rosales, good morning this patient had a procedure done on Thursday in the afternoon but things went quite late so he could not go down to the lab. If you look on my colonoscopy report I have multiple labs that I would like to be performed. The patient and wife will come in after 10:00 today so that you have time to be able to add these labs to the system so that they can be performed.  There is also a fecal calprotectin order that I would like but is not clear he will do a stool study today but he could pick up the stool kit as necessary. Thanks. GM

## 2020-07-27 NOTE — Telephone Encounter (Signed)
Is Jan 19th okay? I see Dr Rush Landmark sent in prednisone today.

## 2020-07-27 NOTE — Telephone Encounter (Signed)
Patient's wife Judeen Hammans notified. They have many questions. I told her once the lab results come in and the pathology comes back , Dr Rush Landmark will have more information for her. She will write down the questions so when they come for the visit she can get answers.

## 2020-07-27 NOTE — Telephone Encounter (Signed)
All orders have been entered as requested on the procedure report.

## 2020-07-27 NOTE — Telephone Encounter (Signed)
Jan 19th is fine.  He said within 2 weeks.

## 2020-07-30 ENCOUNTER — Telehealth: Payer: Self-pay

## 2020-07-30 ENCOUNTER — Telehealth: Payer: Self-pay | Admitting: *Deleted

## 2020-07-30 NOTE — Telephone Encounter (Signed)
  Follow up Call-  Call back number 07/26/2020 07/18/2019  Post procedure Call Back phone  # [phone]  Permission to leave phone message Yes Yes  Some recent data might be hidden     Patient questions:  Do you have a fever, pain , or abdominal swelling? No. Pain Score  0 *  Have you tolerated food without any problems? Yes.    Have you been able to return to your normal activities? Yes.    Do you have any questions about your discharge instructions: Diet   No. Medications  No. Follow up visit  No.  Do you have questions or concerns about your Care? No.  Actions: * If pain score is 4 or above: No action needed, pain <4.  1. Have you developed a fever since your procedure? no  2.   Have you had an respiratory symptoms (SOB or cough) since your procedure? no  3.   Have you tested positive for COVID 19 since your procedure no  4.   Have you had any family members/close contacts diagnosed with the COVID 19 since your procedure?  no   If yes to any of these questions please route to Joylene John, RN and Joella Prince, RN

## 2020-07-30 NOTE — Telephone Encounter (Signed)
Could not leave voicemail.

## 2020-07-31 LAB — CALPROTECTIN, FECAL: Calprotectin, Fecal: 638 ug/g — ABNORMAL HIGH (ref 0–120)

## 2020-08-01 ENCOUNTER — Encounter: Payer: Self-pay | Admitting: Gastroenterology

## 2020-08-02 LAB — THIOPURINE METHYLTRANSFERASE (TPMT), RBC: Thiopurine Methyltransferase, RBC: 9 nmol/hr/mL RBC — ABNORMAL LOW

## 2020-08-02 LAB — QUANTIFERON-TB GOLD PLUS
Mitogen-NIL: 8.82 IU/mL
NIL: 0.03 IU/mL
QuantiFERON-TB Gold Plus: NEGATIVE
TB1-NIL: 0.01 IU/mL
TB2-NIL: 0.01 IU/mL

## 2020-08-02 LAB — HEPATITIS B SURFACE ANTIBODY,QUALITATIVE: Hep B S Ab: NONREACTIVE

## 2020-08-02 LAB — HEPATITIS B CORE ANTIBODY, TOTAL: Hep B Core Total Ab: NONREACTIVE

## 2020-08-02 LAB — HEPATITIS B SURFACE ANTIGEN: Hepatitis B Surface Ag: NONREACTIVE

## 2020-08-02 LAB — VARICELLA ZOSTER ANTIBODY, IGG: Varicella IgG: 1613 index

## 2020-08-02 LAB — HEPATITIS A ANTIBODY, TOTAL: Hepatitis A AB,Total: NONREACTIVE

## 2020-08-02 LAB — HEPATITIS A ANTIBODY, IGM: Hep A IgM: NONREACTIVE

## 2020-08-08 ENCOUNTER — Ambulatory Visit: Payer: Medicare Other | Admitting: Gastroenterology

## 2020-08-08 ENCOUNTER — Encounter: Payer: Self-pay | Admitting: Gastroenterology

## 2020-08-08 ENCOUNTER — Telehealth: Payer: Self-pay

## 2020-08-08 ENCOUNTER — Other Ambulatory Visit (INDEPENDENT_AMBULATORY_CARE_PROVIDER_SITE_OTHER): Payer: Medicare Other

## 2020-08-08 VITALS — BP 120/72 | HR 117 | Ht 69.0 in | Wt 184.0 lb

## 2020-08-08 DIAGNOSIS — R933 Abnormal findings on diagnostic imaging of other parts of digestive tract: Secondary | ICD-10-CM

## 2020-08-08 DIAGNOSIS — K51019 Ulcerative (chronic) pancolitis with unspecified complications: Secondary | ICD-10-CM

## 2020-08-08 DIAGNOSIS — Z8619 Personal history of other infectious and parasitic diseases: Secondary | ICD-10-CM

## 2020-08-08 DIAGNOSIS — D509 Iron deficiency anemia, unspecified: Secondary | ICD-10-CM

## 2020-08-08 DIAGNOSIS — R5383 Other fatigue: Secondary | ICD-10-CM

## 2020-08-08 DIAGNOSIS — K5150 Left sided colitis without complications: Secondary | ICD-10-CM

## 2020-08-08 DIAGNOSIS — B3781 Candidal esophagitis: Secondary | ICD-10-CM

## 2020-08-08 DIAGNOSIS — R066 Hiccough: Secondary | ICD-10-CM

## 2020-08-08 DIAGNOSIS — K529 Noninfective gastroenteritis and colitis, unspecified: Secondary | ICD-10-CM

## 2020-08-08 LAB — COMPREHENSIVE METABOLIC PANEL
ALT: 34 U/L (ref 0–53)
AST: 15 U/L (ref 0–37)
Albumin: 2.9 g/dL — ABNORMAL LOW (ref 3.5–5.2)
Alkaline Phosphatase: 81 U/L (ref 39–117)
BUN: 21 mg/dL (ref 6–23)
CO2: 26 mEq/L (ref 19–32)
Calcium: 8.5 mg/dL (ref 8.4–10.5)
Chloride: 98 mEq/L (ref 96–112)
Creatinine, Ser: 0.61 mg/dL (ref 0.40–1.50)
GFR: 98.09 mL/min (ref >60.00)
Glucose, Bld: 126 mg/dL — ABNORMAL HIGH (ref 70–99)
Potassium: 4.5 mEq/L (ref 3.5–5.1)
Sodium: 132 mEq/L — ABNORMAL LOW (ref 135–145)
Total Bilirubin: 0.2 mg/dL (ref 0.2–1.2)
Total Protein: 6.1 g/dL (ref 6.0–8.3)

## 2020-08-08 LAB — CBC
HCT: 28.3 % — ABNORMAL LOW (ref 39.0–52.0)
Hemoglobin: 9 g/dL — ABNORMAL LOW (ref 13.0–17.0)
MCHC: 31.8 g/dL (ref 30.0–36.0)
MCV: 76.6 fl — ABNORMAL LOW (ref 78.0–100.0)
Platelets: 476 10*3/uL — ABNORMAL HIGH (ref 150.0–400.0)
RBC: 3.69 Mil/uL — ABNORMAL LOW (ref 4.22–5.81)
RDW: 19.1 % — ABNORMAL HIGH (ref 11.5–15.5)
WBC: 11.4 10*3/uL — ABNORMAL HIGH (ref 4.0–10.5)

## 2020-08-08 LAB — SEDIMENTATION RATE: Sed Rate: 102 mm/hr — ABNORMAL HIGH (ref 0–20)

## 2020-08-08 LAB — HIGH SENSITIVITY CRP: CRP, High Sensitivity: 77.31 mg/L — ABNORMAL HIGH (ref 0.000–5.000)

## 2020-08-08 MED ORDER — BACLOFEN 5 MG PO TABS: ORAL_TABLET | ORAL | 0 refills | Status: DC

## 2020-08-08 MED ORDER — PREDNISONE 10 MG PO TABS: ORAL_TABLET | ORAL | 0 refills | Status: DC

## 2020-08-08 MED ORDER — MERCAPTOPURINE 50 MG PO TABS: ORAL_TABLET | ORAL | 2 refills | Status: DC

## 2020-08-08 NOTE — Patient Instructions (Addendum)
Your provider has requested that you go to the basement level for lab work before leaving today. Press "B" on the elevator. The lab is located at the first door on the left as you exit the elevator.  You will need repeat labs in 2 weeks. (08/22/20) Orders have been entered.   Continue Prednisone 48m once daily until your follow-up or until you have started Remicade.   Increase your Iron Supplement to TWICE daily.   Try to have at least 2-4 cans of Protein( cans or shake) Supplements daily    You will be contacted by Patty-RN in regards to Remicade and IV Iron Infusion. If you have not heard from our office in 1 week, please call at [phone] for Patty- RN.   We have sent the following medications to your pharmacy for you to pick up at your convenience: Mercaptopurine - Take 1/2 tablet by mouth once daily. , Baclofen, Prednisone   If you are age 377or older, your body mass index should be between 23-30. Your Body mass index is 27.17 kg/m. If this is out of the aforementioned range listed, please consider follow up with your Primary Care Provider.  If you are age 373or younger, your body mass index should be between 19-25. Your Body mass index is 27.17 kg/m. If this is out of the aformentioned range listed, please consider follow up with your Primary Care Provider.   Please keep follow-up appointment on 09/14/20 with Dr. MRush Landmark   Thank you for choosing me and [facility].  Dr. MRush Landmark

## 2020-08-08 NOTE — Telephone Encounter (Signed)
Justin Rosales,  Per Dr.Mansouraty pt needs- 1.Remicade( 0/2/6/8)- pt will also need to have labs with each Remicade Infusion CBC,CMP, ESR, and CRP.  2.IV Iron Infusion x2.   Pt has been given information on Remicade. Pt and wife aware that they will be contacted with appointments.

## 2020-08-08 NOTE — Telephone Encounter (Signed)
Referral made to hematology for iron infusions.  Amy this pt will need to be initiated on remicade please advise.

## 2020-08-09 ENCOUNTER — Telehealth: Payer: Self-pay

## 2020-08-09 ENCOUNTER — Telehealth: Payer: Self-pay | Admitting: Family

## 2020-08-09 ENCOUNTER — Other Ambulatory Visit: Payer: Self-pay

## 2020-08-09 DIAGNOSIS — K51019 Ulcerative (chronic) pancolitis with unspecified complications: Secondary | ICD-10-CM

## 2020-08-09 NOTE — Telephone Encounter (Signed)
Called and left a vm with np ref appt from [facility], advised a letter will be mailed and may also view in my chart    Webb Silversmith

## 2020-08-09 NOTE — Telephone Encounter (Signed)
Called and advised patient of New Patient appointment that has been scheduled.  Letter/Calendar was mail to him as well

## 2020-08-09 NOTE — Telephone Encounter (Signed)
He has [facility] Medicare and will not need precert for Remicade.

## 2020-08-09 NOTE — Telephone Encounter (Signed)
Admission orders entered for Remicade 0,2,6,8 then every 8 weeks.  Call placed to the Patient Justin Rosales for appt.  The first appt is 08/21/20 at 8 am.  Justin Rosales   The pt has been advised of the appt for Remicade. He has been scheduled with hematology and will come in for labs at his convenience.  He will call with any questions or concerns

## 2020-08-11 DIAGNOSIS — B3781 Candidal esophagitis: Secondary | ICD-10-CM | POA: Insufficient documentation

## 2020-08-11 DIAGNOSIS — R066 Hiccough: Secondary | ICD-10-CM | POA: Insufficient documentation

## 2020-08-11 DIAGNOSIS — Z8619 Personal history of other infectious and parasitic diseases: Secondary | ICD-10-CM | POA: Insufficient documentation

## 2020-08-11 DIAGNOSIS — R933 Abnormal findings on diagnostic imaging of other parts of digestive tract: Secondary | ICD-10-CM | POA: Insufficient documentation

## 2020-08-11 DIAGNOSIS — R5383 Other fatigue: Secondary | ICD-10-CM | POA: Insufficient documentation

## 2020-08-11 DIAGNOSIS — R197 Diarrhea, unspecified: Secondary | ICD-10-CM | POA: Insufficient documentation

## 2020-08-11 NOTE — Progress Notes (Signed)
[place] VISIT   Primary Care Provider Shelda Pal, [address] [place] [address] [phone]  Patient Profile: Justin Rosales is a 70 y.o. male with a pmh significant for chronic left-sided ulcerative colitis (previously on oral medication now may require more), recent C. difficile infection (requiring p.o. 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  The patient presents to the [facility] for an evaluation and management of problem(s) noted below:  Problem List 1. Left sided ulcerative (chronic) colitis ([place])   2. History of Clostridioides difficile infection   3. Iron deficiency anemia, unspecified iron deficiency anemia type   4. Chronic diarrhea   5. History of Candida esophagitis ([place])   6. Fatigue, unspecified type   7. Hiccups   8. Abnormal colonoscopy     History of Present Illness Please see initial consultation note and progress notes by Dr. Bryan Lemma and hospital notes as well as most recent clinic note from [place] for full details of HPI.  Interval History I last saw the patient at the time of his endoscopic and colonoscopy.  Findings were significant for severe left-sided ulcerative colitis changes with biopsies confirming left-sided disease and no evidence of cytomegalovirus infection.  Patient was initiated on high-dose steroids and has maintained those though in the last 3 days has gone down from 40 mg daily to 30 mg daily.  Patient has noted a significant improvement in his symptoms over the course of the last few weeks but continues to have diarrhea.  He is having between 2 and 6 bowel movements per day though normally between 2 and 4.  There is no blood with his bowel movements.  He is waking up in the middle of the night at least once to twice to have a nocturnal bowel movement.   Describes some mild urgency.  No fecal incontinence.  Patient has continued to have weight loss but is beginning to eat more.  He has between 1 and 2 protein shakes per day in addition to his meals by mouth.  Patient was given medications for his hiccups (baclofen) and these have improved.  He continues to have fatigue.  He wonders if this is a result of his iron deficiency.  Patient not having significant abdominal pain currently.  Multiple questions were answered including consideration of continued probiotics, different tensile side effects/complications from biologic therapy which is being considered, short and long-term goals, likelihood of requiring surgery.  GI Review of Systems Positive as above including bloating at times Negative for pyrosis, dysphagia, odynophagia, melena, medic easy  Review of Systems General: Positive for unintentional weight loss; denies fevers/chills HEENT: Denies oral lesions Cardiovascular: Denies chest pain/palpitations Pulmonary: Denies shortness of breath/cough Gastroenterological: See HPI Genitourinary: Denies darkened urine Hematological: Positive for history of easy bruising/bleeding due to anticoagulation Dermatological: Denies jaundice Psychological: Mood remains anxious about the future but he is beginning to feel like there is a plan coming into place   Medications Current Outpatient Medications  Medication Sig Dispense Refill  . apixaban (ELIQUIS) 5 MG TABS tablet Take 1 tablet (46m) twice daily 60 tablet 5  . diphenhydrAMINE HCl, Sleep, (ZZZQUIL PO) Take 10 mLs by mouth daily as needed (sleep).    . Ferrous Sulfate (IRON) 325 (65 Fe) MG TABS Take 1 tablet by mouth daily.    . fluconazole (DIFLUCAN) 100 MG tablet Take 1 tablet (100 mg total) by mouth daily. On  Day 1- Take 400 mg Day 2 -14- Take 200 mg 30 tablet 0  . mercaptopurine (PURINETHOL) 50 MG tablet Take 1/2 tablet po QD. 90 tablet 2  . Multiple Vitamin (MULTIVITAMIN WITH MINERALS) TABS  tablet Take 1 tablet by mouth daily.    . NON FORMULARY Take 1 tablet by mouth daily. Vital reds-energy supplement    . ondansetron (ZOFRAN-ODT) 4 MG disintegrating tablet Take 1 tablet (4 mg total) by mouth every 8 (eight) hours as needed for nausea or vomiting. 30 tablet 0  . predniSONE (DELTASONE) 10 MG tablet Take 3 tablets( total of 28m) by mouth once daily. 100 tablet 0  . Saccharomyces boulardii (FLORASTOR PO) Take 1 capsule by mouth in the morning and at bedtime.    . Baclofen 5 MG TABS 1 tablet three times daily as needed May increase to 10 mg tid if needed 60 tablet 0   No current facility-administered medications for this visit.    Allergies No Known Allergies  Histories Past Medical History:  Diagnosis Date  . Anemia   . Arthritis   . C. difficile diarrhea   . DVT (deep venous thrombosis) ([place]    right  . Elevated cholesterol   . Hypertension   . UC (ulcerative colitis) ([facility]    Past Surgical History:  Procedure Laterality Date  . COLONOSCOPY     [facility]. last one done around 2012    Social History   Socioeconomic History  . Marital status: Married    Spouse name: Not on file  . Number of children: 2  . Years of education: Not on file  . Highest education level: Not on file  Occupational History  . Occupation: PEmergency planning/management officer Tobacco Use  . Smoking status: Former SResearch scientist (life sciences) . Smokeless tobacco: Never Used  Vaping Use  . Vaping Use: Never used  Substance and Sexual Activity  . Alcohol use: Yes    Comment: ocassionally  . Drug use: Not Currently  . Sexual activity: Not on file  Other Topics Concern  . Not on file  Social History Narrative  . Not on file   Social Determinants of Health   Financial Resource Strain: Not on file  Food Insecurity: Not on file  Transportation Needs: Not on file  Physical Activity: Not on file  Stress: Not on file  Social Connections:  Not on file  Intimate Partner Violence: Not on file   Family History  Problem Relation Age of Onset  . Hypertension Mother   . Diabetes Mother   . Alzheimer's disease Mother   . Parkinson's disease Father   . Alzheimer's disease Father   . Colon cancer Neg Hx   . Esophageal cancer Neg Hx   . Rectal cancer Neg Hx   . Stomach cancer Neg Hx    I have reviewed his medical, social, and family history in detail and updated the electronic medical record as necessary.    PHYSICAL EXAMINATION  BP 120/72   Pulse (!) 117   Ht _0  (1.753 m)   Wt 184 lb (83.5 kg)   BMI 27.17 kg/m  Wt Readings from Last 3 Encounters:  08/08/20 184 lb (83.5 kg)  07/26/20 199 lb (90.3 kg)  07/19/20 199 lb (90.3 kg)  GEN: NAD, appears older than stated age, appears chronically ill but nontoxic, accompanied by wife PSYCH: Cooperative, without pressured speech EYE: Conjunctivae pink, sclerae anicteric ENT: No  evidence of thrush CV: Nontachycardic RESP: No audible wheezing GI: NABS, soft, rounded, NT/ND, without rebound MSK/EXT: No lower extremity edema SKIN: No jaundice NEURO:  Alert & Oriented x 3, no focal deficits   REVIEW OF DATA  I reviewed the following data at the time of this encounter:  GI Procedures and Studies  January 2022 upper endoscopy - Esophageal plaques were found, suspicious for candidiasis. Biopsied. - Z-line regular, 43 cm from the incisors. - Erythematous mucosa in the stomach. No other gross lesions in the stomach. Biopsied. - No gross lesions in the duodenal bulb, in the first portion of the duodenum and in the second portion of the duodenum. Biopsied.  January 2022 colonoscopy - Esophageal plaques were found, suspicious for candidiasis. Biopsied. - Z-line regular, 43 cm from the incisors. - Erythematous mucosa in the stomach. No other gross lesions in the stomach. Biopsied. - No gross lesions in the duodenal bulb, in the first portion of the duodenum and in the second  portion of the duodenum. Biopsied.  Pathology Diagnosis 1. Surgical [P], right colon - COLONIC MUCOSA WITH NO SIGNIFICANT PATHOLOGIC FINDINGS. - NEGATIVE FOR ACTIVE INFLAMMATION, GRANULOMATA AND DYSPLASIA. 2. Surgical [P], left colon - SEVERELY ACTIVE CHRONIC COLITIS. - NEGATIVE FOR GRANULOMATA AND DYSPLASIA. 3. Surgical [P], colon, rectum - SEVERELY ACTIVE CHRONIC COLITIS. - NEGATIVE FOR GRANULOMATA AND DYSPLASIA. 4. Surgical [P], duodenal - DUODENAL MUCOSA WITH NO SIGNIFICANT PATHOLOGIC FINDINGS. - NEGATIVE FOR INCREASED INTRAEPITHELIAL LYMPHOCYTES AND VILLOUS ARCHITECTURAL CHANGES. 5. Surgical [P], stomach, gastric - GASTRIC ANTRAL MUCOSA WITH MILD REACTIVE GASTROPATHY. - GASTRIC OXYNTIC MUCOSA WITH MILD CHRONIC GASTRITIS. - WARTHIN-STARRY STAIN IS NEGATIVE FOR HELICOBACTER PYLORI. 6. Surgical [P], esophagus - CANDIDA ESOPHAGITIS [PAS/F STAIN PERFORMED]. Microscopic Comment 1. -3. ADDENDUM: Immunohistochemistry for CMV is negative.  Laboratory Studies  Reviewed those in epic  Imaging Studies  December 2021 CT chest abdomen pelvis IMPRESSION: 1. Respiratory motion degrades image quality, limiting the evaluation of mid and lower lung zone segmental and subsegmental pulmonary arteries. Otherwise, no pulmonary embolus. 2. Nodular lesions in the right upper and right lower lobes, possibly infectious or inflammatory in etiology. Malignancy cannot be excluded. Non-contrast chest CT at 3-6 months is recommended. If the nodules are stable at time of repeat CT, then future CT at 18-24 months (from today's scan) is considered optional for low-risk patients, but is recommended for high-risk patients. This recommendation follows the consensus statement: Guidelines for Management of Incidental Pulmonary Nodules Detected on CT Images: From the Fleischner Society 2017; Radiology 2017; 284:228-243. 3. Mild proximal jejunal wall thickening with wall thickening involving the distal  transverse and proximal sigmoid colon, as well as pericolonic inflammatory haziness. Sigmoid colon may be involved as well. Findings are slightly progressive and indicative of an infectious or inflammatory enterocolitis. 4.  Aortic atherosclerosis (ICD10-I70.0).   ASSESSMENT  Mr. Maybee is a 70 y.o. male with a pmh significant for chronic left-sided ulcerative colitis (previously on oral medication now may require more), recent C. difficile infection (requiring p.o. 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  The patient is seen today for evaluation and management of:  1. Left sided ulcerative (chronic) colitis ([place])   2. History of Clostridioides difficile infection   3. Iron deficiency anemia, unspecified iron deficiency anemia type   4. Chronic diarrhea   5. History of Candida esophagitis ([place])   6. Fatigue, unspecified type   7. Hiccups   8. Abnormal colonoscopy    The patient  is hemodynamically stable.  Clinically, he seems to have mild improvement in the setting of his continued steroid use and this what was recently found to be dealing with severe ulcerative colitis changes likely as a result of his recent C. difficile throwing him into an ulcerative colitis flare.  He remains on high-dose steroids.  We discussed after his endoscopic evaluation that biologic therapy would most likely be required.  We gave him information about multiple agents including Humira and Remicade and Entyvio.  He has some history and information about Remicade since his son is on Remicade for rheumatoid arthritis.  After extensive discussion about the risks and benefits of being on biologic therapy as well as immunomodulators therapy we have agreed to initiate the patient on Remicade.  We will continue high-dose steroids at least 30 mg daily for now since he is already decreased to 30 mg but I do not plan for him to  decrease any further until his Remicade is initiated.  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  We will monitor him closely in regards to his LFTs and CBC to ensure he does not have any significant issues and periodically check his metabolites as well.  The patient understands that he remains at high risk for recurrence of C. difficile but hopefully we can get his colon under better control and get this flare under continued better control.  I would like him to decrease his baclofen use for his hiccups to no more than once to twice daily if possible.  We are going to initiate him on IV iron therapy (he has received some previously and his hospital stays).  Hopefully we will see the patient continue to have improvement.  He will need close monitoring and follow-up.  All patient questions were answered to the best of my ability, and the patient agrees to the aforementioned plan of action with follow-up as indicated.   PLAN  Laboratories as outlined below Future laboratories as outlined below to be obtained every 2 weeks (CBC and HFP to ensure no issues in regards to his immunomodulators therapy Initiate 6-MP 25 mcg daily Continue prednisone 30 mg daily May continue sulfasalazine 1000 mg daily for now Initiate approval for Remicade infusion (0/2/6/8 week initiation) Try to minimize baclofen use to no more than once to twice daily with eventual hope of decreasing complete Increase protein supplement intake to 2-4 times daily   Orders Placed This Encounter  Procedures  . CBC  . Comp Met (CMET)  . Sedimentation rate  . CRP High sensitivity  . Thiopurine Metabolites  . CBC  . Comp Met (CMET)  . Sedimentation rate  . CRP High sensitivity    New Prescriptions   MERCAPTOPURINE (PURINETHOL) 50 MG TABLET    Take 1/2 tablet po QD.   PREDNISONE (DELTASONE) 10 MG TABLET    Take 3 tablets( total of 52m) by mouth once daily.   Modified Medications    Modified Medication Previous Medication   BACLOFEN 5 MG TABS Baclofen 5 MG TABS      1 tablet three times daily as needed May increase to 10 mg tid if needed    1 tablet three times daily as needed May increase to 10 mg tid if needed    Planned Follow Up No follow-ups on file.   Total Time in Face-to-Face and in Coordination of Care for patient including independent/personal interpretation/review of prior testing, medical history, examination, medication adjustment, communicating results  with the patient directly, and documentation with the EHR is 40 minutes   Justice Britain, MD [facility] Office # [phone]

## 2020-08-13 ENCOUNTER — Telehealth: Payer: Self-pay | Admitting: Gastroenterology

## 2020-08-13 LAB — THIOPURINE METABOLITES
6 MMP(6-Methylmercaptopurine): <500 pmol/8x10(8)RBC (ref <5700)
6 TG(6-Thioguanine): <50 pmol/8x10(8)RBC — ABNORMAL LOW (ref 235–400)

## 2020-08-13 NOTE — Telephone Encounter (Signed)
Inbound call from patient wanting to know if he needs to have labs done this week before his remicade.  Please advise.

## 2020-08-13 NOTE — Telephone Encounter (Signed)
The pt has been advised that per Rovanda's notes that he is due for labs on 09/11/20. The pt thanked me for the call and will have labs at that time.

## 2020-08-21 ENCOUNTER — Institutional Professional Consult (permissible substitution): Payer: Medicare Other | Admitting: Emergency Medicine

## 2020-08-21 ENCOUNTER — Ambulatory Visit (HOSPITAL_COMMUNITY)
Admission: RE | Admit: 2020-08-21 | Discharge: 2020-08-21 | Disposition: A | Discharge status: Home / Self Care | Payer: Medicare Other | Source: Ambulatory Visit | Attending: Internal Medicine | Admitting: Internal Medicine

## 2020-08-21 DIAGNOSIS — K51019 Ulcerative (chronic) pancolitis with unspecified complications: Secondary | ICD-10-CM | POA: Diagnosis present

## 2020-08-21 MED ORDER — SODIUM CHLORIDE 0.9 % IV SOLN
10.0000 mg/kg | Freq: Once | INTRAVENOUS | Status: AC
  Administered 2020-08-21: 800 mg via INTRAVENOUS
  Filled 2020-08-21: qty 80

## 2020-08-21 MED ORDER — SODIUM CHLORIDE 0.9 % IV SOLN
INTRAVENOUS | Status: DC | PRN
  Administered 2020-08-21: 250 mL via INTRAVENOUS

## 2020-08-21 NOTE — Discharge Instructions (Signed)
Infliximab injection What is this medicine? INFLIXIMAB (in [place] i mab) is used to treat Crohn's disease and ulcerative colitis. It is also used to treat ankylosing spondylitis, plaque psoriasis, and some forms of arthritis. This medicine may be used for other purposes; ask your health care provider or pharmacist if you have questions. COMMON BRAND NAME(S): AVSOLA, INFLECTRA, IXIFI, Remicade, RENFLEXIS What should I tell my health care provider before I take this medicine? They need to know if you have any of these conditions:  cancer  current or past resident of [place] or [place]  diabetes  exposure to tuberculosis  Guillain-Barre syndrome  heart failure  hepatitis or liver disease  immune system problems  infection  lung or breathing disease, like COPD  multiple sclerosis  receiving phototherapy for the skin  seizure disorder  an unusual or allergic reaction to infliximab, mouse proteins, other medicines, foods, dyes, or preservatives  pregnant or trying to get pregnant  breast-feeding How should I use this medicine? This medicine is for injection into a vein. It is usually given by a health care professional in a hospital or clinic setting. A special MedGuide will be given to you by the pharmacist with each prescription and refill. Be sure to read this information carefully each time. Talk to your pediatrician regarding the use of this medicine in children. While this drug may be prescribed for children as young as 90 years of age for selected conditions, precautions do apply. Overdosage: If you think you have taken too much of this medicine contact a poison control center or emergency room at once. NOTE: This medicine is only for you. Do not share this medicine with others. What if I miss a dose? It is important not to miss your dose. Call your doctor or health care professional if you are unable to keep an appointment. What may interact with this  medicine? Do not take this medicine with any of the following medications:  biologic medicines such as abatacept, adalimumab, anakinra, certolizumab, etanercept, golimumab, rituximab, secukinumab, tocilizumab, tofactinib, ustekinumab  live vaccines This list may not describe all possible interactions. Give your health care provider a list of all the medicines, herbs, non-prescription drugs, or dietary supplements you use. Also tell them if you smoke, drink alcohol, or use illegal drugs. Some items may interact with your medicine. What should I watch for while using this medicine? Your condition will be monitored carefully while you are receiving this medicine. Visit your doctor or health care professional for regular checks on your progress. You may need blood work done while you are taking this medicine. Before beginning therapy, your doctor may do a test to see if you have been exposed to tuberculosis. Call your doctor or health care professional for advice if you get a fever, chills or sore throat, or other symptoms of a cold or flu. Do not treat yourself. This drug decreases your body's ability to fight infections. Try to avoid being around people who are sick. This medicine may make the symptoms of heart failure worse in some patients. If you notice symptoms such as increased shortness of breath or swelling of the ankles or legs, contact your health care provider right away. If you are going to have surgery or dental work, tell your health care professional or dentist that you have received this medicine. If you take this medicine for plaque psoriasis, stay out of the sun. If you cannot avoid being in the sun, wear protective clothing and use sunscreen.  Do not use sun lamps or tanning beds/booths. Talk to your doctor about your risk of cancer. You may be more at risk for certain types of cancers if you take this medicine. What side effects may I notice from receiving this medicine? Side effects  that you should report to your doctor or health care professional as soon as possible:  allergic reactions like skin rash, itching or hives, swelling of the face, lips, or tongue  breathing problems  changes in vision  chest pain  fever or chills, usually related to the infusion  joint pain  pain, tingling, numbness in the hands or feet  redness, blistering, peeling or loosening of the skin, including inside the mouth  seizures  signs of infection - fever or chills, cough, sore throat, flu-like symptoms, pain or difficulty passing urine  signs and symptoms of liver injury like dark yellow or brown urine; general ill feeling; light-colored stools; loss of appetite; nausea; right upper belly pain; unusually weak or tired; yellowing of the eyes or skin  signs and symptoms of a stroke like changes in vision; confusion; trouble speaking or understanding; severe headaches; sudden numbness or weakness of the face, arm or leg; trouble walking; dizziness; loss of balance or coordination  swelling of the ankles, feet, or hands  swollen lymph nodes in the neck, underarm, or groin areas  unusual bleeding or bruising  unusually weak or tired Side effects that usually do not require medical attention (report to your doctor or health care professional if they continue or are bothersome):  headache  nausea  stomach pain  upset stomach This list may not describe all possible side effects. Call your doctor for medical advice about side effects. You may report side effects to FDA at 1-800-FDA-1088. Where should I keep my medicine? This drug is given in a hospital or clinic and will not be stored at home. NOTE: This sheet is a summary. It may not cover all possible information. If you have questions about this medicine, talk to your doctor, pharmacist, or health care provider.  2021 Elsevier/Gold Standard (2016-08-06 13:45:32)

## 2020-08-21 NOTE — Progress Notes (Signed)
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 Infusion titrated per protocol. Vital signs monitored and remained stable. Patient tolerated well with no adverse reaction. Observed patient for 1 hour post infusion. Discharge instructions given. Patient to come back in 2 weeks.  Infusion ordered for 2/6/8 weeks. Then every 8 weeks. Patient alert, oriented and ambulatory at discharge.

## 2020-08-22 ENCOUNTER — Institutional Professional Consult (permissible substitution): Payer: 59 | Admitting: Emergency Medicine

## 2020-08-22 ENCOUNTER — Other Ambulatory Visit (INDEPENDENT_AMBULATORY_CARE_PROVIDER_SITE_OTHER): Payer: Medicare Other

## 2020-08-22 DIAGNOSIS — Z8619 Personal history of other infectious and parasitic diseases: Secondary | ICD-10-CM | POA: Diagnosis not present

## 2020-08-22 DIAGNOSIS — D509 Iron deficiency anemia, unspecified: Secondary | ICD-10-CM | POA: Diagnosis not present

## 2020-08-22 DIAGNOSIS — K5150 Left sided colitis without complications: Secondary | ICD-10-CM | POA: Diagnosis not present

## 2020-08-22 LAB — COMPREHENSIVE METABOLIC PANEL
ALT: 14 U/L (ref 0–53)
AST: 8 U/L (ref 0–37)
Albumin: 2.7 g/dL — ABNORMAL LOW (ref 3.5–5.2)
Alkaline Phosphatase: 55 U/L (ref 39–117)
BUN: 15 mg/dL (ref 6–23)
CO2: 29 mEq/L (ref 19–32)
Calcium: 8.6 mg/dL (ref 8.4–10.5)
Chloride: 101 mEq/L (ref 96–112)
Creatinine, Ser: 0.64 mg/dL (ref 0.40–1.50)
GFR: 96.65 mL/min (ref >60.00)
Glucose, Bld: 112 mg/dL — ABNORMAL HIGH (ref 70–99)
Potassium: 4.1 mEq/L (ref 3.5–5.1)
Sodium: 135 mEq/L (ref 135–145)
Total Bilirubin: 0.3 mg/dL (ref 0.2–1.2)
Total Protein: 5.7 g/dL — ABNORMAL LOW (ref 6.0–8.3)

## 2020-08-22 LAB — CBC
HCT: 27 % — ABNORMAL LOW (ref 39.0–52.0)
Hemoglobin: 8.4 g/dL — ABNORMAL LOW (ref 13.0–17.0)
MCHC: 31.2 g/dL (ref 30.0–36.0)
MCV: 76.5 fl — ABNORMAL LOW (ref 78.0–100.0)
Platelets: 543 10*3/uL — ABNORMAL HIGH (ref 150.0–400.0)
RBC: 3.53 Mil/uL — ABNORMAL LOW (ref 4.22–5.81)
RDW: 20.1 % — ABNORMAL HIGH (ref 11.5–15.5)
WBC: 10.5 10*3/uL (ref 4.0–10.5)

## 2020-08-22 LAB — HIGH SENSITIVITY CRP: CRP, High Sensitivity: 47.73 mg/L — ABNORMAL HIGH (ref 0.000–5.000)

## 2020-08-22 LAB — SEDIMENTATION RATE: Sed Rate: 96 mm/hr — ABNORMAL HIGH (ref 0–20)

## 2020-08-23 ENCOUNTER — Ambulatory Visit: Payer: Medicare Other | Admitting: Physician Assistant

## 2020-08-24 ENCOUNTER — Encounter: Payer: Self-pay | Admitting: Hematology & Oncology

## 2020-08-24 ENCOUNTER — Inpatient Hospital Stay: Payer: Medicare Other | Admitting: Hematology & Oncology

## 2020-08-24 ENCOUNTER — Other Ambulatory Visit: Payer: Self-pay

## 2020-08-24 ENCOUNTER — Inpatient Hospital Stay: Payer: Medicare Other | Attending: Hematology & Oncology

## 2020-08-24 VITALS — BP 158/72 | HR 115 | Temp 99.0°F | Resp 18 | Wt 187.0 lb

## 2020-08-24 DIAGNOSIS — K909 Intestinal malabsorption, unspecified: Secondary | ICD-10-CM | POA: Diagnosis not present

## 2020-08-24 DIAGNOSIS — Z86718 Personal history of other venous thrombosis and embolism: Secondary | ICD-10-CM | POA: Diagnosis not present

## 2020-08-24 DIAGNOSIS — K51219 Ulcerative (chronic) proctitis with unspecified complications: Secondary | ICD-10-CM

## 2020-08-24 DIAGNOSIS — I10 Essential (primary) hypertension: Secondary | ICD-10-CM | POA: Diagnosis not present

## 2020-08-24 DIAGNOSIS — K5180 Other ulcerative colitis without complications: Secondary | ICD-10-CM | POA: Insufficient documentation

## 2020-08-24 DIAGNOSIS — Z7901 Long term (current) use of anticoagulants: Secondary | ICD-10-CM | POA: Insufficient documentation

## 2020-08-24 DIAGNOSIS — D508 Other iron deficiency anemias: Secondary | ICD-10-CM | POA: Insufficient documentation

## 2020-08-24 DIAGNOSIS — D500 Iron deficiency anemia secondary to blood loss (chronic): Secondary | ICD-10-CM | POA: Diagnosis not present

## 2020-08-24 DIAGNOSIS — Z8249 Family history of ischemic heart disease and other diseases of the circulatory system: Secondary | ICD-10-CM | POA: Insufficient documentation

## 2020-08-24 DIAGNOSIS — E7800 Pure hypercholesterolemia, unspecified: Secondary | ICD-10-CM | POA: Insufficient documentation

## 2020-08-24 DIAGNOSIS — Z7189 Other specified counseling: Secondary | ICD-10-CM

## 2020-08-24 DIAGNOSIS — Z87891 Personal history of nicotine dependence: Secondary | ICD-10-CM | POA: Insufficient documentation

## 2020-08-24 DIAGNOSIS — Z79899 Other long term (current) drug therapy: Secondary | ICD-10-CM | POA: Insufficient documentation

## 2020-08-24 LAB — IRON AND TIBC
Iron: 15 ug/dL — ABNORMAL LOW (ref 42–163)
Saturation Ratios: 9 % — ABNORMAL LOW (ref 20–55)
TIBC: 161 ug/dL — ABNORMAL LOW (ref 202–409)
UIBC: 146 ug/dL (ref 117–376)

## 2020-08-24 LAB — CBC WITH DIFFERENTIAL (CANCER CENTER ONLY)
Abs Immature Granulocytes: 0.09 10*3/uL — ABNORMAL HIGH (ref 0.00–0.07)
Basophils Absolute: 0 10*3/uL (ref 0.0–0.1)
Basophils Relative: 0 %
Eosinophils Absolute: 0 10*3/uL (ref 0.0–0.5)
Eosinophils Relative: 0 %
HCT: 28.1 % — ABNORMAL LOW (ref 39.0–52.0)
Hemoglobin: 8 g/dL — ABNORMAL LOW (ref 13.0–17.0)
Immature Granulocytes: 1 %
Lymphocytes Relative: 12 %
Lymphs Abs: 1.3 10*3/uL (ref 0.7–4.0)
MCH: 23.2 pg — ABNORMAL LOW (ref 26.0–34.0)
MCHC: 28.5 g/dL — ABNORMAL LOW (ref 30.0–36.0)
MCV: 81.4 fL (ref 80.0–100.0)
Monocytes Absolute: 0.6 10*3/uL (ref 0.1–1.0)
Monocytes Relative: 5 %
Neutro Abs: 9.1 10*3/uL — ABNORMAL HIGH (ref 1.7–7.7)
Neutrophils Relative %: 82 %
Platelet Count: 423 10*3/uL — ABNORMAL HIGH (ref 150–400)
RBC: 3.45 MIL/uL — ABNORMAL LOW (ref 4.22–5.81)
RDW: 18.8 % — ABNORMAL HIGH (ref 11.5–15.5)
WBC Count: 11.1 10*3/uL — ABNORMAL HIGH (ref 4.0–10.5)
nRBC: 0 % (ref 0.0–0.2)

## 2020-08-24 LAB — CMP (CANCER CENTER ONLY)
ALT: 13 U/L (ref 0–44)
AST: 9 U/L — ABNORMAL LOW (ref 15–41)
Albumin: 3 g/dL — ABNORMAL LOW (ref 3.5–5.0)
Alkaline Phosphatase: 53 U/L (ref 38–126)
Anion gap: 8 (ref 5–15)
BUN: 18 mg/dL (ref 8–23)
CO2: 28 mmol/L (ref 22–32)
Calcium: 8.8 mg/dL — ABNORMAL LOW (ref 8.9–10.3)
Chloride: 101 mmol/L (ref 98–111)
Creatinine: 0.8 mg/dL (ref 0.61–1.24)
GFR, Estimated: >60 mL/min (ref >60)
Glucose, Bld: 144 mg/dL — ABNORMAL HIGH (ref 70–99)
Potassium: 4.6 mmol/L (ref 3.5–5.1)
Sodium: 137 mmol/L (ref 135–145)
Total Bilirubin: 0.3 mg/dL (ref 0.3–1.2)
Total Protein: 5.6 g/dL — ABNORMAL LOW (ref 6.5–8.1)

## 2020-08-24 LAB — FERRITIN: Ferritin: 221 ng/mL (ref 24–336)

## 2020-08-24 LAB — RETICULOCYTES
Immature Retic Fract: 31.9 % — ABNORMAL HIGH (ref 2.3–15.9)
RBC.: 3.51 MIL/uL — ABNORMAL LOW (ref 4.22–5.81)
Retic Count, Absolute: 129.2 10*3/uL (ref 19.0–186.0)
Retic Ct Pct: 3.7 % — ABNORMAL HIGH (ref 0.4–3.1)

## 2020-08-24 LAB — VITAMIN B12: Vitamin B-12: 901 pg/mL (ref 180–914)

## 2020-08-24 LAB — SAVE SMEAR(SSMR), FOR PROVIDER SLIDE REVIEW

## 2020-08-25 LAB — ERYTHROPOIETIN: Erythropoietin: 80.6 m[IU]/mL — ABNORMAL HIGH (ref 2.6–18.5)

## 2020-08-27 ENCOUNTER — Telehealth: Payer: Self-pay | Admitting: *Deleted

## 2020-08-27 NOTE — Telephone Encounter (Signed)
No los per 08/24/20 to schedule.

## 2020-08-28 ENCOUNTER — Encounter: Payer: Self-pay | Admitting: Hematology & Oncology

## 2020-08-28 ENCOUNTER — Telehealth: Payer: Self-pay

## 2020-08-28 ENCOUNTER — Telehealth: Payer: Self-pay | Admitting: Gastroenterology

## 2020-08-28 DIAGNOSIS — D509 Iron deficiency anemia, unspecified: Secondary | ICD-10-CM | POA: Insufficient documentation

## 2020-08-28 DIAGNOSIS — Z7189 Other specified counseling: Secondary | ICD-10-CM

## 2020-08-28 DIAGNOSIS — K909 Intestinal malabsorption, unspecified: Secondary | ICD-10-CM

## 2020-08-28 DIAGNOSIS — D500 Iron deficiency anemia secondary to blood loss (chronic): Secondary | ICD-10-CM

## 2020-08-28 HISTORY — DX: Iron deficiency anemia secondary to blood loss (chronic): D50.0

## 2020-08-28 HISTORY — DX: Other specified counseling: Z71.89

## 2020-08-28 HISTORY — DX: Intestinal malabsorption, unspecified: K90.9

## 2020-08-28 NOTE — Progress Notes (Signed)
Referral MD  Reason for Referral: Iron deficiency anemia secondary to ulcerative colitis   Chief Complaint  Patient presents with  . New Patient (Initial Visit)  : My blood is low and I am tired.  HPI: Justin Rosales is a very nice 70 year old white male.  He comes in with his son.  His son works in [place].  Justin Rosales has ulcerative colitis.  I think this is patchy.  I know he is followed by gastroenterology very closely.  The problems really began when he developed C. difficile diarrhea.  He was hospitalized for couple weeks.  He just has not recovered from this.  He has had problems with low iron.  He has been transfused.  He has been given IV iron.  He was finally referred to the [facility] to try to help with his iron stores and his anemia.  He has noted no obvious melena.  He has had no bright red blood per rectum.  There is been no fever.  His weight has been down.  He has not been able to really eat much because of the diarrhea that he has had.  Para he is been treated with antibiotics for the C. difficile.  Of course, he is on anticoagulation for a recent DVT in the right posterior tibial vein.  This was diagnosed back in December.  He probably will need 6 months of anticoagulation.  He does not smoke.  Nothing he smoked many years ago.  He really does not have much in the way of alcohol.  There is no history in the family of anemia.  He just feels tired.  His hemoglobin today is 8.0.  He has had no problems with vitamin B12.  His level today is 901.  His iron levels still are quite low.  His ferritin is 221 with iron saturation of 9%.  Currently, I would say his performance status is probably ECOG 1.    Past Medical History:  Diagnosis Date  . Anemia   . Arthritis   . C. difficile diarrhea   . DVT (deep venous thrombosis) ([place])    right  . Elevated cholesterol   . Hypertension   . UC (ulcerative colitis) ([place])   :  Past Surgical History:   Procedure Laterality Date  . COLONOSCOPY     First done at [facility] around age 55. Dr Dorrene German possibly With [facility] x2. last one done around 2012   :   Current Outpatient Medications:  .  apixaban (ELIQUIS) 5 MG TABS tablet, Take 1 tablet (55m) twice daily, Disp: 60 tablet, Rfl: 5 .  Baclofen 5 MG TABS, 1 tablet three times daily as needed May increase to 10 mg tid if needed, Disp: 60 tablet, Rfl: 0 .  diphenhydrAMINE HCl, Sleep, (ZZZQUIL PO), Take 10 mLs by mouth daily as needed (sleep)., Disp: , Rfl:  .  Ferrous Sulfate (IRON) 325 (65 Fe) MG TABS, Take 2 tablets by mouth daily., Disp: , Rfl:  .  mercaptopurine (PURINETHOL) 50 MG tablet, Take 1/2 tablet po QD., Disp: 90 tablet, Rfl: 2 .  Multiple Vitamin (MULTIVITAMIN WITH MINERALS) TABS tablet, Take 1 tablet by mouth daily., Disp: , Rfl:  .  NON FORMULARY, Take 1 tablet by mouth daily. Vital reds-energy supplement, Disp: , Rfl:  .  ondansetron (ZOFRAN-ODT) 4 MG disintegrating tablet, Take 1 tablet (4 mg total) by mouth every 8 (eight) hours as needed for nausea or vomiting., Disp: 30 tablet, Rfl: 0 .  predniSONE (DELTASONE) 10 MG tablet, Take 3 tablets( total of 48m) by mouth once daily., Disp: 100 tablet, Rfl: 0 .  Saccharomyces boulardii (FLORASTOR PO), Take 1 capsule by mouth in the morning and at bedtime., Disp: , Rfl: :  :  No Known Allergies:  Family History  Problem Relation Age of Onset  . Hypertension Mother   . Diabetes Mother   . Alzheimer's disease Mother   . Parkinson's disease Father   . Alzheimer's disease Father   . Colon cancer Neg Hx   . Esophageal cancer Neg Hx   . Rectal cancer Neg Hx   . Stomach cancer Neg Hx   :  Social History   Socioeconomic History  . Marital status: Married    Spouse name: Not on file  . Number of children: 2  . Years of education: Not on file  . Highest education level: Not on file  Occupational History  . Occupation: PEmergency planning/management officer Tobacco Use  .  Smoking status: Former SResearch scientist (life sciences) . Smokeless tobacco: Never Used  Vaping Use  . Vaping Use: Never used  Substance and Sexual Activity  . Alcohol use: Yes    Comment: ocassionally  . Drug use: Not Currently  . Sexual activity: Not on file  Other Topics Concern  . Not on file  Social History Narrative  . Not on file   Social Determinants of Health   Financial Resource Strain: Not on file  Food Insecurity: Not on file  Transportation Needs: Not on file  Physical Activity: Not on file  Stress: Not on file  Social Connections: Not on file  Intimate Partner Violence: Not on file  :  Review of Systems  Constitutional: Positive for malaise/fatigue and weight loss.  HENT: Negative.   Eyes: Negative.   Respiratory: Negative.   Cardiovascular: Negative.   Gastrointestinal: Positive for abdominal pain, blood in stool, diarrhea and heartburn.  Genitourinary: Negative.   Musculoskeletal: Positive for joint pain and myalgias.  Skin: Negative.   Neurological: Negative.   Endo/Heme/Allergies: Negative.   Psychiatric/Behavioral: Negative.      Exam:  Physical Exam Vitals reviewed.  HENT:     Head: Normocephalic and atraumatic.     Mouth/Throat:     Mouth: Oropharynx is clear and moist.  Eyes:     Extraocular Movements: EOM normal.     Pupils: Pupils are equal, round, and reactive to light.  Cardiovascular:     Rate and Rhythm: Normal rate and regular rhythm.     Heart sounds: Normal heart sounds.  Pulmonary:     Effort: Pulmonary effort is normal.     Breath sounds: Normal breath sounds.  Abdominal:     General: Bowel sounds are normal.     Palpations: Abdomen is soft.  Musculoskeletal:        General: No tenderness, deformity or edema. Normal range of motion.     Cervical back: Normal range of motion.  Lymphadenopathy:     Cervical: No cervical adenopathy.  Skin:    General: Skin is warm and dry.     Findings: No erythema or rash.  Neurological:     Mental Status:  He is alert and oriented to person, place, and time.  Psychiatric:        Mood and Affect: Mood and affect normal.        Behavior: Behavior normal.        Thought Content: Thought content normal.        Judgment: Judgment  normal.    _0 @   No results for input(s): WBC, HGB, HCT, PLT in the last 72 hours. No results for input(s): NA, K, CL, CO2, GLUCOSE, BUN, CREATININE, CALCIUM in the last 72 hours.  Blood smear review: Anisocytosis and poikilocytosis.  He has microcytic red blood cells.  He has hypochromic red blood cells.  He has a couple teardrop cells.  He has no target cells.  I see no nucleated red blood cells.  There is some polychromasia.  White blood cells appear normal in morphology maturation.  He has no immature myeloid or lymphoid cells.  I see no hypersegmented polys.  Platelets are adequate number and size.  Pathology: None    Assessment and Plan: Justin Rosales is a very nice 70 year old white male.  He has ulcerative colitis.  He has iron deficiency anemia.  He just is not can be able to absorb iron orally.  As such, oral iron really is not going to make much of a difference for him.  We will have to give him IV iron.  He might need quite a bit of IV iron.  Of course, we cannot give the best kind of iron because of insurance issues.  We will have to give him Venofer.  I will give him as much Venofer as possible.  He is probably will need a good 3 or 4 doses of Venofer to replete his iron stores.  I do not see a need for a bone marrow biopsy.  His blood smear is definitely consistent with iron deficiency anemia.  I am unsure why the C. difficile made things worse for him.  Maybe had to the fact that he really cannot eat.  I am sure he still not eating all that much.  We will see about starting the IV iron next week.  I will likely see him back in about 6 weeks.  By then, we should see a nice improvement in his parameters and mostly in his hemoglobin.

## 2020-08-28 NOTE — Telephone Encounter (Signed)
Pt states that he is scheduled for Remicade on 2/15. Dr. Marin Olp scheduled him for iron infusion on  2/11. Pt wants to know if iron infusion will not interfere with remicade. Pls call him.

## 2020-08-28 NOTE — Telephone Encounter (Signed)
pts wife called and r/s his appts to later times       Justin Rosales

## 2020-08-28 NOTE — Telephone Encounter (Signed)
The pt has been advised that he can have Remicade and iron at the times scheduled.  He will call with any further concerns or questions.

## 2020-08-31 ENCOUNTER — Other Ambulatory Visit: Payer: Self-pay | Admitting: Family

## 2020-08-31 ENCOUNTER — Inpatient Hospital Stay: Payer: Medicare Other

## 2020-08-31 ENCOUNTER — Ambulatory Visit: Payer: Medicare Other

## 2020-08-31 ENCOUNTER — Other Ambulatory Visit: Payer: Self-pay

## 2020-08-31 VITALS — BP 151/75 | HR 96 | Temp 98.3°F | Resp 18

## 2020-08-31 DIAGNOSIS — D500 Iron deficiency anemia secondary to blood loss (chronic): Secondary | ICD-10-CM

## 2020-08-31 DIAGNOSIS — D508 Other iron deficiency anemias: Secondary | ICD-10-CM | POA: Diagnosis not present

## 2020-08-31 DIAGNOSIS — K909 Intestinal malabsorption, unspecified: Secondary | ICD-10-CM

## 2020-08-31 MED ORDER — SODIUM CHLORIDE 0.9 % IV SOLN
Freq: Once | INTRAVENOUS | Status: AC
  Filled 2020-08-31: qty 250

## 2020-08-31 MED ORDER — SODIUM CHLORIDE 0.9 % IV SOLN
300.0000 mg | Freq: Once | INTRAVENOUS | Status: AC
  Administered 2020-08-31: 300 mg via INTRAVENOUS
  Filled 2020-08-31: qty 15

## 2020-08-31 NOTE — Patient Instructions (Signed)

## 2020-09-04 ENCOUNTER — Other Ambulatory Visit: Payer: Self-pay

## 2020-09-04 ENCOUNTER — Non-Acute Institutional Stay (HOSPITAL_COMMUNITY)
Admission: RE | Admit: 2020-09-04 | Discharge: 2020-09-04 | Disposition: A | Discharge status: Home / Self Care | Payer: Medicare Other | Source: Ambulatory Visit | Attending: Internal Medicine | Admitting: Internal Medicine

## 2020-09-04 DIAGNOSIS — K51019 Ulcerative (chronic) pancolitis with unspecified complications: Secondary | ICD-10-CM | POA: Insufficient documentation

## 2020-09-04 MED ORDER — SODIUM CHLORIDE 0.9 % IV SOLN
10.0000 mg/kg | Freq: Once | INTRAVENOUS | Status: AC
  Administered 2020-09-04: 800 mg via INTRAVENOUS
  Filled 2020-09-04: qty 80

## 2020-09-04 MED ORDER — SODIUM CHLORIDE 0.9 % IV SOLN
INTRAVENOUS | Status: DC | PRN
  Administered 2020-09-04: 250 mL via INTRAVENOUS

## 2020-09-04 NOTE — Discharge Instructions (Signed)
Infliximab injection What is this medicine? INFLIXIMAB (in [place] i mab) is used to treat Crohn's disease and ulcerative colitis. It is also used to treat ankylosing spondylitis, plaque psoriasis, and some forms of arthritis. This medicine may be used for other purposes; ask your health care provider or pharmacist if you have questions. COMMON BRAND NAME(S): AVSOLA, INFLECTRA, IXIFI, Remicade, RENFLEXIS What should I tell my health care provider before I take this medicine? They need to know if you have any of these conditions:  cancer  current or past resident of [place] or [place]  diabetes  exposure to tuberculosis  Guillain-Barre syndrome  heart failure  hepatitis or liver disease  immune system problems  infection  lung or breathing disease, like COPD  multiple sclerosis  receiving phototherapy for the skin  seizure disorder  an unusual or allergic reaction to infliximab, mouse proteins, other medicines, foods, dyes, or preservatives  pregnant or trying to get pregnant  breast-feeding How should I use this medicine? This medicine is for injection into a vein. It is usually given by a health care professional in a hospital or clinic setting. A special MedGuide will be given to you by the pharmacist with each prescription and refill. Be sure to read this information carefully each time. Talk to your pediatrician regarding the use of this medicine in children. While this drug may be prescribed for children as young as 47 years of age for selected conditions, precautions do apply. Overdosage: If you think you have taken too much of this medicine contact a poison control center or emergency room at once. NOTE: This medicine is only for you. Do not share this medicine with others. What if I miss a dose? It is important not to miss your dose. Call your doctor or health care professional if you are unable to keep an appointment. What may interact with this  medicine? Do not take this medicine with any of the following medications:  biologic medicines such as abatacept, adalimumab, anakinra, certolizumab, etanercept, golimumab, rituximab, secukinumab, tocilizumab, tofactinib, ustekinumab  live vaccines This list may not describe all possible interactions. Give your health care provider a list of all the medicines, herbs, non-prescription drugs, or dietary supplements you use. Also tell them if you smoke, drink alcohol, or use illegal drugs. Some items may interact with your medicine. What should I watch for while using this medicine? Your condition will be monitored carefully while you are receiving this medicine. Visit your doctor or health care professional for regular checks on your progress. You may need blood work done while you are taking this medicine. Before beginning therapy, your doctor may do a test to see if you have been exposed to tuberculosis. Call your doctor or health care professional for advice if you get a fever, chills or sore throat, or other symptoms of a cold or flu. Do not treat yourself. This drug decreases your body's ability to fight infections. Try to avoid being around people who are sick. This medicine may make the symptoms of heart failure worse in some patients. If you notice symptoms such as increased shortness of breath or swelling of the ankles or legs, contact your health care provider right away. If you are going to have surgery or dental work, tell your health care professional or dentist that you have received this medicine. If you take this medicine for plaque psoriasis, stay out of the sun. If you cannot avoid being in the sun, wear protective clothing and use sunscreen.  Do not use sun lamps or tanning beds/booths. Talk to your doctor about your risk of cancer. You may be more at risk for certain types of cancers if you take this medicine. What side effects may I notice from receiving this medicine? Side effects  that you should report to your doctor or health care professional as soon as possible:  allergic reactions like skin rash, itching or hives, swelling of the face, lips, or tongue  breathing problems  changes in vision  chest pain  fever or chills, usually related to the infusion  joint pain  pain, tingling, numbness in the hands or feet  redness, blistering, peeling or loosening of the skin, including inside the mouth  seizures  signs of infection - fever or chills, cough, sore throat, flu-like symptoms, pain or difficulty passing urine  signs and symptoms of liver injury like dark yellow or brown urine; general ill feeling; light-colored stools; loss of appetite; nausea; right upper belly pain; unusually weak or tired; yellowing of the eyes or skin  signs and symptoms of a stroke like changes in vision; confusion; trouble speaking or understanding; severe headaches; sudden numbness or weakness of the face, arm or leg; trouble walking; dizziness; loss of balance or coordination  swelling of the ankles, feet, or hands  swollen lymph nodes in the neck, underarm, or groin areas  unusual bleeding or bruising  unusually weak or tired Side effects that usually do not require medical attention (report to your doctor or health care professional if they continue or are bothersome):  headache  nausea  stomach pain  upset stomach This list may not describe all possible side effects. Call your doctor for medical advice about side effects. You may report side effects to FDA at 1-800-FDA-1088. Where should I keep my medicine? This drug is given in a hospital or clinic and will not be stored at home. NOTE: This sheet is a summary. It may not cover all possible information. If you have questions about this medicine, talk to your doctor, pharmacist, or health care provider.  2021 Elsevier/Gold Standard (2016-08-06 13:45:32)

## 2020-09-04 NOTE — Progress Notes (Signed)
PATIENT CARE CENTER NOTE  Diagnosis: Ulcerative pancolitis with complication ([place]) (L07.615)   Provider: Justice Britain, [place]   Procedure: Remicade infusion    Note: Patient received (2 week)  Remicade infusion via PIV. Infusion titrated per protocol. Vital signs monitored and remained stable. Patient tolerated well with no adverse reaction. Discharge instructions given. Patient to come back in 4 weeks for 6 week infusion.  Infusion ordered for 0/2/6/8 weeks. Then every 8 weeks. Discharge instructions given. Patient alert, oriented and ambulatory at discharge.

## 2020-09-06 ENCOUNTER — Ambulatory Visit: Payer: Medicare Other | Admitting: Family

## 2020-09-06 ENCOUNTER — Other Ambulatory Visit: Payer: Medicare Other

## 2020-09-07 ENCOUNTER — Other Ambulatory Visit: Payer: Self-pay

## 2020-09-07 ENCOUNTER — Ambulatory Visit: Payer: Medicare Other

## 2020-09-07 ENCOUNTER — Inpatient Hospital Stay: Payer: Medicare Other

## 2020-09-07 VITALS — BP 129/58 | HR 90 | Temp 97.9°F | Resp 17

## 2020-09-07 DIAGNOSIS — D508 Other iron deficiency anemias: Secondary | ICD-10-CM | POA: Diagnosis not present

## 2020-09-07 DIAGNOSIS — D500 Iron deficiency anemia secondary to blood loss (chronic): Secondary | ICD-10-CM

## 2020-09-07 DIAGNOSIS — K909 Intestinal malabsorption, unspecified: Secondary | ICD-10-CM

## 2020-09-07 MED ORDER — SODIUM CHLORIDE 0.9 % IV SOLN
Freq: Once | INTRAVENOUS | Status: AC
  Filled 2020-09-07: qty 250

## 2020-09-07 MED ORDER — SODIUM CHLORIDE 0.9 % IV SOLN
300.0000 mg | Freq: Once | INTRAVENOUS | Status: AC
  Administered 2020-09-07: 300 mg via INTRAVENOUS
  Filled 2020-09-07: qty 15

## 2020-09-07 NOTE — Patient Instructions (Signed)

## 2020-09-14 ENCOUNTER — Telehealth: Payer: Self-pay

## 2020-09-14 ENCOUNTER — Ambulatory Visit: Payer: Medicare Other | Admitting: Gastroenterology

## 2020-09-14 ENCOUNTER — Encounter: Payer: Self-pay | Admitting: Gastroenterology

## 2020-09-14 VITALS — BP 142/62 | HR 108 | Ht 69.0 in | Wt 191.2 lb

## 2020-09-14 DIAGNOSIS — Z9225 Personal history of immunosupression therapy: Secondary | ICD-10-CM

## 2020-09-14 DIAGNOSIS — K5150 Left sided colitis without complications: Secondary | ICD-10-CM | POA: Diagnosis not present

## 2020-09-14 DIAGNOSIS — B3781 Candidal esophagitis: Secondary | ICD-10-CM

## 2020-09-14 DIAGNOSIS — Z7901 Long term (current) use of anticoagulants: Secondary | ICD-10-CM | POA: Diagnosis not present

## 2020-09-14 DIAGNOSIS — Z8619 Personal history of other infectious and parasitic diseases: Secondary | ICD-10-CM

## 2020-09-14 DIAGNOSIS — I82509 Chronic embolism and thrombosis of unspecified deep veins of unspecified lower extremity: Secondary | ICD-10-CM | POA: Diagnosis not present

## 2020-09-14 MED ORDER — PREDNISONE 10 MG PO TABS: ORAL_TABLET | ORAL | 0 refills | Status: AC

## 2020-09-14 MED ORDER — MERCAPTOPURINE 50 MG PO TABS: ORAL_TABLET | ORAL | 2 refills | Status: DC

## 2020-09-14 NOTE — Telephone Encounter (Signed)
Justin Rosales, Dr. Rush Landmark wants to make sure pt has pre labs drawn before each Remicade Infusion. He would like for pt to have CBC, Comp, ESR, CRP(HS) before every infusion. Please make sure labs are ob infusion order.  Pt is due for next Remicade in 18 days.

## 2020-09-14 NOTE — Patient Instructions (Addendum)
Increase your Mercaptopurine to 42m once daily.   Start Prednisone taper as directed.    You will need labs prior to your next remicade infusion. We will make sure infusion center has orders.   Please keep follow- up with Dr. MRush Landmarkon - 10/24/20 @ 2:10pm  If you are age 7032or older, your body mass index should be between 23-30. Your Body mass index is 28.24 kg/m. If this is out of the aforementioned range listed, please consider follow up with your Primary Care Provider.  If you are age [age]or younger, your body mass index should be between 19-25. Your Body mass index is 28.24 kg/m. If this is out of the aformentioned range listed, please consider follow up with your Primary Care Provider.    Thank you for choosing [facility] and [facility].  Dr. MRush Landmark

## 2020-09-17 ENCOUNTER — Ambulatory Visit: Payer: Medicare Other

## 2020-09-17 ENCOUNTER — Other Ambulatory Visit: Payer: Self-pay

## 2020-09-17 NOTE — Telephone Encounter (Signed)
The labs have been entered to be drawn prior to every remicade infusion.

## 2020-09-18 ENCOUNTER — Other Ambulatory Visit: Payer: Self-pay

## 2020-09-18 ENCOUNTER — Encounter: Payer: Self-pay | Admitting: Gastroenterology

## 2020-09-18 ENCOUNTER — Inpatient Hospital Stay: Payer: Medicare Other | Attending: Hematology & Oncology

## 2020-09-18 VITALS — BP 143/71 | HR 91 | Temp 99.6°F

## 2020-09-18 DIAGNOSIS — K5150 Left sided colitis without complications: Secondary | ICD-10-CM | POA: Insufficient documentation

## 2020-09-18 DIAGNOSIS — D500 Iron deficiency anemia secondary to blood loss (chronic): Secondary | ICD-10-CM | POA: Insufficient documentation

## 2020-09-18 DIAGNOSIS — Z7901 Long term (current) use of anticoagulants: Secondary | ICD-10-CM | POA: Insufficient documentation

## 2020-09-18 DIAGNOSIS — Z9225 Personal history of immunosupression therapy: Secondary | ICD-10-CM | POA: Insufficient documentation

## 2020-09-18 DIAGNOSIS — K5180 Other ulcerative colitis without complications: Secondary | ICD-10-CM | POA: Diagnosis not present

## 2020-09-18 DIAGNOSIS — K909 Intestinal malabsorption, unspecified: Secondary | ICD-10-CM

## 2020-09-18 DIAGNOSIS — I82509 Chronic embolism and thrombosis of unspecified deep veins of unspecified lower extremity: Secondary | ICD-10-CM | POA: Insufficient documentation

## 2020-09-18 DIAGNOSIS — Z79899 Other long term (current) drug therapy: Secondary | ICD-10-CM | POA: Insufficient documentation

## 2020-09-18 DIAGNOSIS — Z86718 Personal history of other venous thrombosis and embolism: Secondary | ICD-10-CM | POA: Insufficient documentation

## 2020-09-18 MED ORDER — SODIUM CHLORIDE 0.9 % IV SOLN
300.0000 mg | Freq: Once | INTRAVENOUS | Status: AC
  Administered 2020-09-18: 300 mg via INTRAVENOUS
  Filled 2020-09-18: qty 5

## 2020-09-18 MED ORDER — SODIUM CHLORIDE 0.9 % IV SOLN
Freq: Once | INTRAVENOUS | Status: AC
  Filled 2020-09-18: qty 250

## 2020-09-18 NOTE — Patient Instructions (Signed)

## 2020-09-18 NOTE — Progress Notes (Signed)
Pt. Refused to wait 30 min. Post infusion. Stable and ASX upon discharge.

## 2020-09-18 NOTE — Progress Notes (Signed)
[place] VISIT   Primary Care Provider Shelda Pal, DO [address] [place] [address] [phone]  Patient Profile: Justin Rosales is a 70 y.o. male with a pmh significant for chronic left-sided ulcerative colitis (now on 6MP + Remicade), recent C. difficile infection (requiring p.o. 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  The patient presents to the [facility] for an evaluation and management of problem(s) noted below:  Problem List 1. Left sided ulcerative (chronic) colitis ([place])   2. History of immunosuppression therapy   3. 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 Chronic anticoagulation   5. History of Clostridioides difficile infection   6. History of Candida esophagitis ([place])     History of Present Illness Please see prior notes from the GI team and myself for full details of HPI.  Interval History The patient is accompanied by his wife today for scheduled follow-up.  He is completed his initial 2 Remicade infusions without complication.  The patient is having between 1 and 4 bowel movements on a daily basis.  He is not having no nocturnal bowel movements.  Still has some urgency but no episodes of incontinence.  He denies any blood in his stools.  Stools range from loose to formed at times.  No mucus.  Bloating and mild abdominal discomfort persist.  Fatigue persists as well but no pica symptoms.  He remains on prednisone 30 mg daily.  He is having some mild tremors and wonders if this could be from any of his medications.  Electrolytes and liver tests and blood counts show no concerns for azathioprine/6-MP toxicity.  He is taking in protein shakes daily.  Appetite increasing but weight continues  to be low.  He is wondering whether and how long he needs to remain on his blood thinner for his diagnosed DVT in December.  GI Review of Systems Positive as above Negative for dysphagia, odynophagia, pyrosis, melena, hematochezia  Review of Systems General: Denies fevers/chills/progressive weight loss HEENT: Denies oral lesions or oral thrush Cardiovascular: Denies chest pain/palpitations Pulmonary: Denies shortness of breath Gastroenterological: See HPI Genitourinary: Denies darkened urine Hematological: Positive for history of easy bruising/bleeding due to anticoagulation Dermatological: Denies jaundice Psychological: Mood is stable   Medications Current Outpatient Medications  Medication Sig Dispense Refill  . apixaban (ELIQUIS) 5 MG TABS tablet Take 1 tablet (45m) twice daily 60 tablet 5  . diphenhydrAMINE HCl, Sleep, (ZZZQUIL PO) Take 10 mLs by mouth daily as needed (sleep).    . Multiple Vitamin (MULTIVITAMIN WITH MINERALS) TABS tablet Take 1 tablet by mouth daily.    . NON FORMULARY Take 1 tablet by mouth daily. Vital reds-energy supplement    . predniSONE (DELTASONE) 10 MG tablet Take 3 tablets( total of 358m by mouth once daily. 100 tablet 0  . predniSONE (DELTASONE) 10 MG tablet Take 2 tablets (20 mg total) by mouth daily with breakfast for 18 days, THEN 1.5 tablets (15 mg total) daily with breakfast for 14 days, THEN 1 tablet (10 mg total) daily with breakfast for 14 days, THEN 0.5 tablets (5 mg total) daily with breakfast for 14 days. 80 tablet 0  . Saccharomyces boulardii (FLORASTOR PO) Take 1 capsule by mouth in the morning and at bedtime.    . mercaptopurine (PURINETHOL) 50 MG tablet Take 1 tablet po QD.  90 tablet 2   No current facility-administered medications for this visit.    Allergies No Known Allergies  Histories Past Medical History:  Diagnosis Date  . Anemia   . Arthritis   . C. difficile diarrhea   . DVT (deep venous thrombosis) ([place])    right  .  Elevated cholesterol   . Goals of care, counseling/discussion 08/28/2020  . Hypertension   . Iron deficiency anemia due to chronic blood loss 08/28/2020  . 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 UC (ulcerative colitis) [facility])    Past Surgical History:  Procedure Laterality Date  . COLONOSCOPY     First done at [facility] around age 60. Dr Dorrene German possibly With [facility] x2. last one done around 2012    Social History   Socioeconomic History  . Marital status: Married    Spouse name: Not on file  . Number of children: 2  . Years of education: Not on file  . Highest education level: Not on file  Occupational History  . Occupation: Emergency planning/management officer  Tobacco Use  . Smoking status: Former Research scientist (life sciences)  . Smokeless tobacco: Never Used  Vaping Use  . Vaping Use: Never used  Substance and Sexual Activity  . Alcohol use: Yes    Comment: ocassionally  . Drug use: Not Currently  . Sexual activity: Not on file  Other Topics Concern  . Not on file  Social History Narrative  . Not on file   Social Determinants of Health   Financial Resource Strain: Not on file  Food Insecurity: Not on file  Transportation Needs: Not on file  Physical Activity: Not on file  Stress: Not on file  Social Connections: Not on file  Intimate Partner Violence: Not on file   Family History  Problem Relation Age of Onset  . Hypertension Mother   . Diabetes Mother   . Alzheimer's disease Mother   . Parkinson's disease Father   . Alzheimer's disease Father   . Colon cancer Neg Hx   . Esophageal cancer Neg Hx   . Rectal cancer Neg Hx   . Stomach cancer Neg Hx    I have reviewed his medical, social, and family history in detail and updated the electronic medical record as necessary.    PHYSICAL EXAMINATION  BP (!) 142/62   Pulse (!) 108   Ht _0  (1.753 m)   Wt 191 lb 3.2 oz (86.7 kg)   BMI 28.24 kg/m  Wt Readings from Last 3 Encounters:  09/14/20 191 lb 3.2 oz (86.7 kg)  09/04/20 190 lb 3.2 oz  (86.3 kg)  08/24/20 187 lb (84.8 kg)  GEN: NAD, no longer appears chronically ill, nontoxic, accompanied by wife PSYCH: Cooperative, without pressured speech EYE: Conjunctivae pink, sclerae anicteric ENT: No evidence of thrush CV: Nontachycardic RESP: No audible wheezing GI: NABS, soft, rounded, NT/ND, without rebound MSK/EXT: No lower extremity edema SKIN: No jaundice NEURO:  Alert & Oriented x 3, no focal deficits   REVIEW OF DATA  I reviewed the following data at the time of this encounter:  GI Procedures and Studies  No new studies to review  Laboratory Studies  Reviewed those in epic  Imaging Studies  No new imaging to review   ASSESSMENT  Mr. Burdo is a 70 y.o. male with a pmh significant for chronic left-sided ulcerative colitis (now on 6MP + Remicade), recent C. difficile infection (requiring p.o. 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  The patient is seen today for evaluation and management of:  1. Left sided ulcerative (chronic) colitis ([place])   2. History of immunosuppression therapy   3. 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 Chronic anticoagulation   5. History of Clostridioides difficile infection   6. History of Candida esophagitis ([place])    The patient seems to be both hemodynamically and clinically stable at this time.  He seems to be making good progress on Remicade as well as his steroid medications.  We will further decrease and taper his prednisone over the course of the coming weeks as he moves closer to completing his Remicade induction.  Based on his labs it looks like he may tolerate further up titration of his 6-MP as well and we will increase that from 25 mg up to 50 mg.  I suspect his tremors are likely a result of the continued steroid and so  hopefully with down titration we will see some improvement in that.  If this persists we may consider a neurological evaluation/referral.  He remains at risk of C. difficile in the future but hopefully things will remain stable and we will not have this recur.  His anemia is significant and I am not sure if this at this point is a result of bone marrow suppression in the setting of his significant recent hospitalizations and weight loss and issues because he has no overt GI blood loss to really give [place] a sense as to why this is persisting.  I appreciate our hematology colleagues further evaluating and considering this anemia as am not sure I have much more to add at this time while he is having his Remicade induction.  His inflammatory markers have slightly decreased though not completely.  There is a chance that we will need to evaluate his colon sooner than the 1 year mark for endoscopic mucosal healing especially if he remains anemic or his bowel habits do not continue to have clinical improvement.  All patient questions were answered to the best of my ability, and the patient agrees to the aforementioned plan of action with follow-up as indicated.   PLAN  Patient to obtain CBC/CMP/ESR/CRP at each Remicade infusion Increase 6-MP to 50 mg daily Prednisone taper -20 mg x 18 days (day of next Remicade infusion) -15 mg x 14 days -10 mg x 14 days -5 mg x 14 days -Off Increase protein supplement intake to 2-4 times daily Appreciate hematology follow-up and further intravenous iron infusions if necessary   No orders of the defined types were placed in this encounter.   New Prescriptions   PREDNISONE (DELTASONE) 10 MG TABLET    Take 2 tablets (20 mg total) by mouth daily with breakfast for 18 days, THEN 1.5 tablets (15 mg total) daily with breakfast for 14 days, THEN 1 tablet (10 mg total) daily with breakfast for 14 days, THEN 0.5 tablets (5 mg total) daily with breakfast for 14 days.   Modified  Medications   Modified Medication Previous Medication   MERCAPTOPURINE (PURINETHOL) 50 MG TABLET mercaptopurine (PURINETHOL) 50 MG tablet      Take 1 tablet po QD.    Take 1/2 tablet po QD.    Planned Follow Up No follow-ups on file.   Total Time in Face-to-Face and in Coordination of Care for patient including independent/personal interpretation/review of prior testing, medical history, examination, medication adjustment, communicating results  with the patient directly, and documentation with the EHR is 30 minutes.  Justice Britain, MD [facility] Office # [phone]

## 2020-09-28 ENCOUNTER — Encounter: Payer: Self-pay | Admitting: Hematology & Oncology

## 2020-09-28 ENCOUNTER — Telehealth: Payer: Self-pay

## 2020-09-28 ENCOUNTER — Ambulatory Visit (HOSPITAL_BASED_OUTPATIENT_CLINIC_OR_DEPARTMENT_OTHER)
Admission: RE | Admit: 2020-09-28 | Discharge: 2020-09-28 | Disposition: A | Discharge status: Home / Self Care | Payer: Medicare Other | Source: Ambulatory Visit | Attending: Hematology & Oncology | Admitting: Hematology & Oncology

## 2020-09-28 ENCOUNTER — Inpatient Hospital Stay: Payer: Medicare Other

## 2020-09-28 ENCOUNTER — Inpatient Hospital Stay: Payer: Medicare Other | Admitting: Hematology & Oncology

## 2020-09-28 ENCOUNTER — Other Ambulatory Visit: Payer: Self-pay

## 2020-09-28 VITALS — BP 154/73 | HR 107 | Temp 99.2°F | Resp 16 | Wt 188.0 lb

## 2020-09-28 DIAGNOSIS — I82401 Acute embolism and thrombosis of unspecified deep veins of right lower extremity: Secondary | ICD-10-CM | POA: Insufficient documentation

## 2020-09-28 DIAGNOSIS — D500 Iron deficiency anemia secondary to blood loss (chronic): Secondary | ICD-10-CM

## 2020-09-28 DIAGNOSIS — I824Z1 Acute embolism and thrombosis of unspecified deep veins of right distal lower extremity: Secondary | ICD-10-CM

## 2020-09-28 DIAGNOSIS — K909 Intestinal malabsorption, unspecified: Secondary | ICD-10-CM

## 2020-09-28 HISTORY — DX: Acute embolism and thrombosis of unspecified deep veins of right distal lower extremity: I82.4Z1

## 2020-09-28 LAB — CBC WITH DIFFERENTIAL (CANCER CENTER ONLY)
Abs Immature Granulocytes: 0.08 10*3/uL — ABNORMAL HIGH (ref 0.00–0.07)
Basophils Absolute: 0.1 10*3/uL (ref 0.0–0.1)
Basophils Relative: 1 %
Eosinophils Absolute: 0.1 10*3/uL (ref 0.0–0.5)
Eosinophils Relative: 1 %
HCT: 31.9 % — ABNORMAL LOW (ref 39.0–52.0)
Hemoglobin: 9.3 g/dL — ABNORMAL LOW (ref 13.0–17.0)
Immature Granulocytes: 1 %
Lymphocytes Relative: 17 %
Lymphs Abs: 1.8 10*3/uL (ref 0.7–4.0)
MCH: 24.5 pg — ABNORMAL LOW (ref 26.0–34.0)
MCHC: 29.2 g/dL — ABNORMAL LOW (ref 30.0–36.0)
MCV: 84.2 fL (ref 80.0–100.0)
Monocytes Absolute: 0.5 10*3/uL (ref 0.1–1.0)
Monocytes Relative: 5 %
Neutro Abs: 8.2 10*3/uL — ABNORMAL HIGH (ref 1.7–7.7)
Neutrophils Relative %: 75 %
Platelet Count: 490 10*3/uL — ABNORMAL HIGH (ref 150–400)
RBC: 3.79 MIL/uL — ABNORMAL LOW (ref 4.22–5.81)
RDW: 19 % — ABNORMAL HIGH (ref 11.5–15.5)
WBC Count: 10.7 10*3/uL — ABNORMAL HIGH (ref 4.0–10.5)
nRBC: 0 % (ref 0.0–0.2)

## 2020-09-28 LAB — CMP (CANCER CENTER ONLY)
ALT: 17 U/L (ref 0–44)
AST: 11 U/L — ABNORMAL LOW (ref 15–41)
Albumin: 3.7 g/dL (ref 3.5–5.0)
Alkaline Phosphatase: 65 U/L (ref 38–126)
Anion gap: 8 (ref 5–15)
BUN: 18 mg/dL (ref 8–23)
CO2: 29 mmol/L (ref 22–32)
Calcium: 9.8 mg/dL (ref 8.9–10.3)
Chloride: 99 mmol/L (ref 98–111)
Creatinine: 0.85 mg/dL (ref 0.61–1.24)
GFR, Estimated: >60 mL/min (ref >60)
Glucose, Bld: 113 mg/dL — ABNORMAL HIGH (ref 70–99)
Potassium: 4.3 mmol/L (ref 3.5–5.1)
Sodium: 136 mmol/L (ref 135–145)
Total Bilirubin: 0.4 mg/dL (ref 0.3–1.2)
Total Protein: 6.6 g/dL (ref 6.5–8.1)

## 2020-09-28 MED ORDER — FOLIC ACID 1 MG PO TABS: 2.0000 mg | ORAL_TABLET | Freq: Every day | ORAL | 12 refills | Status: DC

## 2020-09-28 NOTE — Progress Notes (Signed)
Hematology and Oncology Follow Up Visit  Justin Rosales [id number] [birth year] 70 y.o. 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 twice daily-to complete 6 months of therapy in June 2022     Interim History:  Mr. Justin Rosales is back for second office visit.  We saw him back in early February.  At that time, he had anemia that was significant.  He was quite iron deficient.  He had problems with ulcerative colitis.  He unfortunately developed Clostridium diarrhea on top of this.  He was hospitalized for a couple weeks.  He had marked anemia.  He was transfused with blood.  I think he also got some IV iron.  When we saw him in the office back in February, his ferritin was 221 with an iron saturation of only 9%.  We went ahead and gave him some IV iron.  He feels better.  His hemoglobin is improving.  His MCV also is slowly coming up.  We did do a Doppler of his right leg today.  There is no blood clot in the right lower leg.  He is on Eliquis at 5 mg p.o. twice daily.  We will keep him on this for right now.  He has had no obvious bleeding.  He has had some loose stools.  His appetite is picking up a little bit.  He has been on quite a few medications.  He is no start Remicade.  Is wondering if he might be able to get Remicade in our office.  [place].  It would be a lot easier for him to have Remicade in our office.  We will have to talk to his gastroenterologist about this.  He has had no fever.  He has had no cough or shortness of breath.  Currently, his performance status is ECOG 1.  Medications:  Current Outpatient Medications:  .  Cholecalciferol (DIALYVITE VITAMIN D 5000) 125 MCG (5000 UT) capsule, Take 5,000 Units by mouth daily., Disp: , Rfl:  .  folic  acid (FOLVITE) 1 MG tablet, Take 2 tablets (2 mg total) by mouth daily., Disp: 60 tablet, Rfl: 12 .  apixaban (ELIQUIS) 5 MG TABS tablet, Take 1 tablet (81m) twice daily, Disp: 60 tablet, Rfl: 5 .  Bepotastine Besilate 1.5 % SOLN, , Disp: , Rfl:  .  diphenhydrAMINE HCl, Sleep, (ZZZQUIL PO), Take 10 mLs by mouth daily as needed (sleep)., Disp: , Rfl:  .  mercaptopurine (PURINETHOL) 50 MG tablet, Take 1 tablet po QD., Disp: 90 tablet, Rfl: 2 .  Multiple Vitamin (MULTIVITAMIN WITH MINERALS) TABS tablet, Take 1 tablet by mouth daily., Disp: , Rfl:  .  NON FORMULARY, Take 1 tablet by mouth daily. Vital reds-energy supplement, Disp: , Rfl:  .  predniSONE (DELTASONE) 10 MG tablet, Take 2 tablets (20 mg total) by mouth daily with breakfast for 18 days, THEN 1.5 tablets (15 mg total) daily with breakfast for 14 days, THEN 1 tablet (10 mg total) daily with breakfast for 14 days, THEN 0.5 tablets (5 mg total) daily with breakfast for 14 days., Disp: 80 tablet, Rfl: 0 .  Saccharomyces boulardii (FLORASTOR PO), Take 1 capsule by mouth in the morning and at bedtime., Disp: , Rfl:   Allergies: No Known Allergies  Past Medical History, Surgical history, Social history, and Family History were reviewed and updated.  Review of Systems: Review of Systems  Constitutional: Positive for fatigue.  HENT:  Negative.   Eyes: Negative.   Respiratory: Positive for shortness of breath.   Cardiovascular: Negative.   Gastrointestinal: Positive for blood in stool and diarrhea.  Endocrine: Negative.   Genitourinary: Negative.    Musculoskeletal: Negative.   Skin: Negative.   Neurological: Negative.   Hematological: Negative.   Psychiatric/Behavioral: Negative.     Physical Exam:  weight is 188 lb (85.3 kg). His oral temperature is 99.2 F (37.3 C). His blood pressure is 154/73 (abnormal) and his pulse is 107 (abnormal). His respiration is 16 and oxygen saturation is 98%.   Wt Readings from Last 3 Encounters:   09/28/20 188 lb (85.3 kg)  09/14/20 191 lb 3.2 oz (86.7 kg)  09/04/20 190 lb 3.2 oz (86.3 kg)    Physical Exam Vitals reviewed.  HENT:     Head: Normocephalic and atraumatic.  Eyes:     Pupils: Pupils are equal, round, and reactive to light.  Cardiovascular:     Rate and Rhythm: Normal rate and regular rhythm.     Heart sounds: Normal heart sounds.  Pulmonary:     Effort: Pulmonary effort is normal.     Breath sounds: Normal breath sounds.  Abdominal:     General: Bowel sounds are normal.     Palpations: Abdomen is soft.  Musculoskeletal:        General: No tenderness or deformity. Normal range of motion.     Cervical back: Normal range of motion.  Lymphadenopathy:     Cervical: No cervical adenopathy.  Skin:    General: Skin is warm and dry.     Findings: No erythema or rash.  Neurological:     Mental Status: He is alert and oriented to person, place, and time.  Psychiatric:        Behavior: Behavior normal.        Thought Content: Thought content normal.        Judgment: Judgment normal.    Lab Results  Component Value Date   WBC 10.7 (H) 09/28/2020   HGB 9.3 (L) 09/28/2020   HCT 31.9 (L) 09/28/2020   MCV 84.2 09/28/2020   PLT 490 (H) 09/28/2020     Chemistry      Component Value Date/Time   NA 136 09/28/2020 1131   K 4.3 09/28/2020 1131   CL 99 09/28/2020 1131   CO2 29 09/28/2020 1131   BUN 18 09/28/2020 1131   CREATININE 0.85 09/28/2020 1131      Component Value Date/Time   CALCIUM 9.8 09/28/2020 1131   ALKPHOS 65 09/28/2020 1131   AST 11 (L) 09/28/2020 1131   ALT 17 09/28/2020 1131   BILITOT 0.4 09/28/2020 1131       Impression and Plan: Mr. [name] is a very nice 70 year old white male.  He has ulcerative colitis.  He had Clostridium diarrhea.  He became profoundly iron deficient.  We are given his iron back.  This is improving his hemoglobin.  It is improving his quality of life.  I know that he is on 6-MP.  This will certainly affect his blood  counts to some degree.  I do think that he might need some folic acid.  This certainly could help with his erythropoiesis.  We will look into doing the Remicade for him.  The dose is 5 mg/kg.  This will  be a whole lot easier for him to have it in our office as the office is a lot closer for him and given gas prices, it would save him money.  I am happy that there is no further thromboembolism in the right leg.  He is on Eliquis.  I may actually consider dropping his dose down to 2.5 mg p.o. twice daily when we see him back.  I will plan to get him back to see [place] in another 3-4 weeks.  He comes in with his wife.  She is very nice.  [name], MD 3/11/20225:48 PM

## 2020-09-28 NOTE — Telephone Encounter (Signed)
Called pt and he is aware of his appts per 09/28/20 los    [employer]

## 2020-10-01 ENCOUNTER — Other Ambulatory Visit: Payer: Self-pay | Admitting: Hematology & Oncology

## 2020-10-01 ENCOUNTER — Encounter: Payer: Self-pay | Admitting: *Deleted

## 2020-10-01 DIAGNOSIS — K51819 Other ulcerative colitis with unspecified complications: Secondary | ICD-10-CM

## 2020-10-01 LAB — IRON AND TIBC
Iron: 11 ug/dL — ABNORMAL LOW (ref 42–163)
Saturation Ratios: 5 % — ABNORMAL LOW (ref 20–55)
TIBC: 221 ug/dL (ref 202–409)
UIBC: 210 ug/dL (ref 117–376)

## 2020-10-01 LAB — FERRITIN: Ferritin: 317 ng/mL (ref 24–336)

## 2020-10-01 MED ORDER — SODIUM CHLORIDE 0.9 % IV SOLN: 5.0000 mg/kg | INTRAVENOUS | Status: DC

## 2020-10-02 ENCOUNTER — Other Ambulatory Visit: Payer: Self-pay

## 2020-10-02 ENCOUNTER — Non-Acute Institutional Stay (HOSPITAL_COMMUNITY)
Admission: RE | Admit: 2020-10-02 | Discharge: 2020-10-02 | Disposition: A | Discharge status: Home / Self Care | Payer: Medicare Other | Source: Ambulatory Visit | Attending: Internal Medicine | Admitting: Internal Medicine

## 2020-10-02 DIAGNOSIS — R7982 Elevated C-reactive protein (CRP): Secondary | ICD-10-CM | POA: Insufficient documentation

## 2020-10-02 DIAGNOSIS — K51019 Ulcerative (chronic) pancolitis with unspecified complications: Secondary | ICD-10-CM | POA: Diagnosis present

## 2020-10-02 DIAGNOSIS — K5190 Ulcerative colitis, unspecified, without complications: Secondary | ICD-10-CM | POA: Diagnosis not present

## 2020-10-02 LAB — COMPREHENSIVE METABOLIC PANEL
ALT: 18 U/L (ref 0–44)
AST: 13 U/L — ABNORMAL LOW (ref 15–41)
Albumin: 3.1 g/dL — ABNORMAL LOW (ref 3.5–5.0)
Alkaline Phosphatase: 64 U/L (ref 38–126)
Anion gap: 11 (ref 5–15)
BUN: 15 mg/dL (ref 8–23)
CO2: 26 mmol/L (ref 22–32)
Calcium: 8.9 mg/dL (ref 8.9–10.3)
Chloride: 99 mmol/L (ref 98–111)
Creatinine, Ser: 0.81 mg/dL (ref 0.61–1.24)
GFR, Estimated: >60 mL/min (ref >60)
Glucose, Bld: 93 mg/dL (ref 70–99)
Potassium: 3.8 mmol/L (ref 3.5–5.1)
Sodium: 136 mmol/L (ref 135–145)
Total Bilirubin: 0.8 mg/dL (ref 0.3–1.2)
Total Protein: 6.6 g/dL (ref 6.5–8.1)

## 2020-10-02 LAB — SEDIMENTATION RATE: Sed Rate: 45 mm/hr — ABNORMAL HIGH (ref 0–16)

## 2020-10-02 LAB — CBC WITH DIFFERENTIAL/PLATELET
Abs Immature Granulocytes: 0.04 10*3/uL (ref 0.00–0.07)
Basophils Absolute: 0.1 10*3/uL (ref 0.0–0.1)
Basophils Relative: 1 %
Eosinophils Absolute: 0.1 10*3/uL (ref 0.0–0.5)
Eosinophils Relative: 1 %
HCT: 33.1 % — ABNORMAL LOW (ref 39.0–52.0)
Hemoglobin: 9.4 g/dL — ABNORMAL LOW (ref 13.0–17.0)
Immature Granulocytes: 1 %
Lymphocytes Relative: 23 %
Lymphs Abs: 2 10*3/uL (ref 0.7–4.0)
MCH: 24.6 pg — ABNORMAL LOW (ref 26.0–34.0)
MCHC: 28.4 g/dL — ABNORMAL LOW (ref 30.0–36.0)
MCV: 86.6 fL (ref 80.0–100.0)
Monocytes Absolute: 0.9 10*3/uL (ref 0.1–1.0)
Monocytes Relative: 10 %
Neutro Abs: 5.8 10*3/uL (ref 1.7–7.7)
Neutrophils Relative %: 64 %
Platelets: 468 10*3/uL — ABNORMAL HIGH (ref 150–400)
RBC: 3.82 MIL/uL — ABNORMAL LOW (ref 4.22–5.81)
RDW: 18.8 % — ABNORMAL HIGH (ref 11.5–15.5)
WBC: 8.8 10*3/uL (ref 4.0–10.5)
nRBC: 0 % (ref 0.0–0.2)

## 2020-10-02 MED ORDER — SODIUM CHLORIDE 0.9 % IV SOLN
INTRAVENOUS | Status: DC | PRN
  Administered 2020-10-02: 250 mL via INTRAVENOUS

## 2020-10-02 MED ORDER — SODIUM CHLORIDE 0.9 % IV SOLN
800.0000 mg | Freq: Once | INTRAVENOUS | Status: AC
  Administered 2020-10-02: 800 mg via INTRAVENOUS
  Filled 2020-10-02: qty 80

## 2020-10-02 NOTE — Discharge Instructions (Signed)
Infliximab injection What is this medicine? INFLIXIMAB (in [place] i mab) is used to treat Crohn's disease and ulcerative colitis. It is also used to treat ankylosing spondylitis, plaque psoriasis, and some forms of arthritis. This medicine may be used for other purposes; ask your health care provider or pharmacist if you have questions. COMMON BRAND NAME(S): AVSOLA, INFLECTRA, IXIFI, Remicade, RENFLEXIS What should I tell my health care provider before I take this medicine? They need to know if you have any of these conditions:  cancer  current or past resident of [place] or [place]  diabetes  exposure to tuberculosis  Guillain-Barre syndrome  heart failure  hepatitis or liver disease  immune system problems  infection  lung or breathing disease, like COPD  multiple sclerosis  receiving phototherapy for the skin  seizure disorder  an unusual or allergic reaction to infliximab, mouse proteins, other medicines, foods, dyes, or preservatives  pregnant or trying to get pregnant  breast-feeding How should I use this medicine? This medicine is for injection into a vein. It is usually given by a health care professional in a hospital or clinic setting. A special MedGuide will be given to you by the pharmacist with each prescription and refill. Be sure to read this information carefully each time. Talk to your pediatrician regarding the use of this medicine in children. While this drug may be prescribed for children as young as 70 years of age for selected conditions, precautions do apply. Overdosage: If you think you have taken too much of this medicine contact a poison control center or emergency room at once. NOTE: This medicine is only for you. Do not share this medicine with others. What if I miss a dose? It is important not to miss your dose. Call your doctor or health care professional if you are unable to keep an appointment. What may interact with this  medicine? Do not take this medicine with any of the following medications:  biologic medicines such as abatacept, adalimumab, anakinra, certolizumab, etanercept, golimumab, rituximab, secukinumab, tocilizumab, tofactinib, ustekinumab  live vaccines This list may not describe all possible interactions. Give your health care provider a list of all the medicines, herbs, non-prescription drugs, or dietary supplements you use. Also tell them if you smoke, drink alcohol, or use illegal drugs. Some items may interact with your medicine. What should I watch for while using this medicine? Your condition will be monitored carefully while you are receiving this medicine. Visit your doctor or health care professional for regular checks on your progress. You may need blood work done while you are taking this medicine. Before beginning therapy, your doctor may do a test to see if you have been exposed to tuberculosis. Call your doctor or health care professional for advice if you get a fever, chills or sore throat, or other symptoms of a cold or flu. Do not treat yourself. This drug decreases your body's ability to fight infections. Try to avoid being around people who are sick. This medicine may make the symptoms of heart failure worse in some patients. If you notice symptoms such as increased shortness of breath or swelling of the ankles or legs, contact your health care provider right away. If you are going to have surgery or dental work, tell your health care professional or dentist that you have received this medicine. If you take this medicine for plaque psoriasis, stay out of the sun. If you cannot avoid being in the sun, wear protective clothing and use sunscreen.  Do not use sun lamps or tanning beds/booths. Talk to your doctor about your risk of cancer. You may be more at risk for certain types of cancers if you take this medicine. What side effects may I notice from receiving this medicine? Side effects  that you should report to your doctor or health care professional as soon as possible:  allergic reactions like skin rash, itching or hives, swelling of the face, lips, or tongue  breathing problems  changes in vision  chest pain  fever or chills, usually related to the infusion  joint pain  pain, tingling, numbness in the hands or feet  redness, blistering, peeling or loosening of the skin, including inside the mouth  seizures  signs of infection - fever or chills, cough, sore throat, flu-like symptoms, pain or difficulty passing urine  signs and symptoms of liver injury like dark yellow or brown urine; general ill feeling; light-colored stools; loss of appetite; nausea; right upper belly pain; unusually weak or tired; yellowing of the eyes or skin  signs and symptoms of a stroke like changes in vision; confusion; trouble speaking or understanding; severe headaches; sudden numbness or weakness of the face, arm or leg; trouble walking; dizziness; loss of balance or coordination  swelling of the ankles, feet, or hands  swollen lymph nodes in the neck, underarm, or groin areas  unusual bleeding or bruising  unusually weak or tired Side effects that usually do not require medical attention (report to your doctor or health care professional if they continue or are bothersome):  headache  nausea  stomach pain  upset stomach This list may not describe all possible side effects. Call your doctor for medical advice about side effects. You may report side effects to FDA at 1-800-FDA-1088. Where should I keep my medicine? This drug is given in a hospital or clinic and will not be stored at home. NOTE: This sheet is a summary. It may not cover all possible information. If you have questions about this medicine, talk to your doctor, pharmacist, or health care provider.  2021 Elsevier/Gold Standard (2016-08-06 13:45:32)

## 2020-10-02 NOTE — Progress Notes (Signed)
PATIENT CARE CENTER NOTE  Diagnosis:Ulcerative pancolitis with complication ([place]) (S11.155)   Provider:Mansouraty, Valarie Merino, Md   Procedure:Remicade infusionand lab draw   Note:Patient received (6 week)  Remicade infusion via PIV. Infusion titrated per protocol.  Vital signs monitored and remained stable. Patient tolerated well with no adverse reaction. Labs drawn prior to infusion. Patient to get labs drawn prior to each Remicade infusion. Discharge instructions given. Patient to come back in 2 weeks for 8 week infusion. Infusion ordered for 0/2/6/8 weeks. Then every 8 weeks. Discharge instructions given. Patient alert, oriented and ambulatory at discharge.

## 2020-10-03 ENCOUNTER — Other Ambulatory Visit: Payer: Self-pay

## 2020-10-03 ENCOUNTER — Encounter: Payer: Self-pay | Admitting: Family Medicine

## 2020-10-03 ENCOUNTER — Ambulatory Visit: Payer: Medicare Other | Admitting: Family Medicine

## 2020-10-03 VITALS — BP 126/78 | HR 106 | Temp 97.5°F | Ht 69.0 in | Wt 189.0 lb

## 2020-10-03 DIAGNOSIS — Z86718 Personal history of other venous thrombosis and embolism: Secondary | ICD-10-CM

## 2020-10-03 DIAGNOSIS — I10 Essential (primary) hypertension: Secondary | ICD-10-CM | POA: Diagnosis not present

## 2020-10-03 LAB — HIGH SENSITIVITY CRP: CRP, High Sensitivity: 31.23 mg/L — ABNORMAL HIGH (ref 0.00–3.00)

## 2020-10-03 NOTE — Progress Notes (Signed)
Chief Complaint  Patient presents with  . Hypertension    3 month follow up   . DVT    Hx of dvt, taper off eliquis 5 mg    Subjective Justin Rosales is a 70 y.o. male who presents for hypertension follow up. He does monitor home blood pressures. Blood pressures ranging from 120's/70's on average. He stopped his Exforge. He is adhering to a healthy diet overall. Current exercise: none No CP or new SOB.  DVT Provoked DVT around 3.5 mo ago. Taking Eliquis daily. US showed no DVT in the same leg. Wondering if he needs to be on it.    Past Medical History:  Diagnosis Date  . Anemia   . Arthritis   . C. difficile diarrhea   . DVT (deep venous thrombosis) ([place])    right  . Elevated cholesterol   . Goals of care, counseling/discussion 08/28/2020  . Hypertension   . Iron deficiency anemia due to chronic blood loss 08/28/2020  . 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 Lower leg DVT (deep venous thromboembolism), acute, right ([place]) 09/28/2020  . UC (ulcerative colitis) ([place])     Exam BP 126/78 (BP Location: Left Arm, Patient Position: Sitting, Cuff Size: Normal)   Pulse (!) 106   Temp (!) 97.5 F (36.4 C)   Ht 5' 9"  (1.753 m)   Wt 189 lb (85.7 kg)   SpO2 98%   BMI 27.91 kg/m  General:  well developed, well nourished, in no apparent distress Heart: RRR, no bruits, no LE edema Lungs: clear to auscultation, no accessory muscle use MSK: No TTP over b/l calves Psych: well oriented with normal range of affect and appropriate judgment/insight  Essential hypertension  History of DVT (deep vein thrombosis)  1. OK to stay off of BP meds for now. As his anemia resolves, he will start monitoring BP again, goal is <150/90. Counseled on diet and exercise. 2. Cont Eliquis for now. Reach out to hematology team to see if they want to keep him on this or take off.  F/u in 6 mo for CPE or prn. The patient voiced understanding and agreement to the plan.  [place], DO 10/03/20  11:24  AM

## 2020-10-03 NOTE — Patient Instructions (Signed)
I would ask Dr. Marin Olp if he wants you on the Eliquis for the DVT.   Keep the diet clean and stay active.  Aim to do some physical exertion for 150 minutes per week. This is typically divided into 5 days per week, 30 minutes per day. The activity should be enough to get your heart rate up. Anything is better than nothing if you have time constraints.  When your blood count comes back up and the anemia has resolved, monitor your blood pressure 1-2 times per week. The goal is <150/90 consistently.   Let us know if you need anything.

## 2020-10-05 ENCOUNTER — Inpatient Hospital Stay: Payer: Medicare Other

## 2020-10-05 ENCOUNTER — Other Ambulatory Visit: Payer: Self-pay | Admitting: Family

## 2020-10-05 ENCOUNTER — Other Ambulatory Visit: Payer: Self-pay

## 2020-10-05 VITALS — BP 141/67 | HR 97 | Temp 97.7°F | Resp 16

## 2020-10-05 DIAGNOSIS — K909 Intestinal malabsorption, unspecified: Secondary | ICD-10-CM

## 2020-10-05 DIAGNOSIS — D500 Iron deficiency anemia secondary to blood loss (chronic): Secondary | ICD-10-CM | POA: Diagnosis not present

## 2020-10-05 MED ORDER — SODIUM CHLORIDE 0.9 % IV SOLN
INTRAVENOUS | Status: DC
  Filled 2020-10-05: qty 250

## 2020-10-05 MED ORDER — SODIUM CHLORIDE 0.9 % IV SOLN
200.0000 mg | Freq: Once | INTRAVENOUS | Status: AC
  Administered 2020-10-05: 200 mg via INTRAVENOUS
  Filled 2020-10-05: qty 200

## 2020-10-05 NOTE — Patient Instructions (Signed)

## 2020-10-07 ENCOUNTER — Encounter: Payer: Self-pay | Admitting: Hematology & Oncology

## 2020-10-08 ENCOUNTER — Other Ambulatory Visit: Payer: Self-pay

## 2020-10-08 ENCOUNTER — Ambulatory Visit: Payer: Medicare Other | Admitting: Emergency Medicine

## 2020-10-08 ENCOUNTER — Encounter: Payer: Self-pay | Admitting: Emergency Medicine

## 2020-10-08 DIAGNOSIS — R918 Other nonspecific abnormal finding of lung field: Secondary | ICD-10-CM

## 2020-10-08 DIAGNOSIS — I824Z1 Acute embolism and thrombosis of unspecified deep veins of right distal lower extremity: Secondary | ICD-10-CM

## 2020-10-08 NOTE — Addendum Note (Signed)
Addended by: Gavin Potters R on: 10/08/2020 10:51 AM   Modules accepted: Orders

## 2020-10-08 NOTE — Patient Instructions (Signed)
We will repeat your CT scan of the chest without contrast in June 2022 to compare with prior. You can probably come off of your Eliquis anticoagulation since you have been treated for 3 months.  Continue to discuss with Dr. Marin Olp. Follow Dr. Lamonte Sakai in June to review your CT scan together

## 2020-10-08 NOTE — Assessment & Plan Note (Signed)
Multiple pulmonary nodules, probably granulomatous disease in the right upper lobe as it is adjacent to a calcified granuloma.  The right lower lobe superior segmental nodule is less well formed, has a groundglass component.  We will follow his CT in June to look for interval stability, resolution.  If the groundglass nodule persist then he will need follow-up for up to 5 years to ensure no concerning interval change.  We will repeat your CT scan of the chest without contrast in June 2022 to compare with prior. Follow Dr. Lamonte Sakai in June to review your CT scan together

## 2020-10-08 NOTE — Assessment & Plan Note (Signed)
You can probably come off of your Eliquis anticoagulation since you have been treated for 3 months.  Continue to discuss with Dr. Marin Olp.

## 2020-10-08 NOTE — Progress Notes (Signed)
Subjective:    Patient ID: Justin Rosales, male    DOB: [birth year], 70 y.o.   MRN: [id number]  HPI 70 year old former smoker (30 pack years) with a history of provoked lower extremity DVT late 2021, hypercholesterolemia, hypertension, ulcerative colitis on Remicade for 3 months, Fe def anemia.  Recent diagnosis with C. difficile colitis October thru December, ultimately requiring hospitalization in December.  He is here today to discuss abnormal CT chest done in December.  No SOB, good exercise tolerance. Has some cough in the Spring due to allergies. No wt loss.   CT chest done 07/11/2020 reviewed by me showed no evidence of PE, right upper and lower lobe nodular disease: 4 mm apical right upper lobe subpleural nodule with adjacent calcified granuloma, a rounded mixed groundglass consolidated area in the superior segment of the right lower lobe 12 mm.   Review of Systems As per HPI  Past Medical History:  Diagnosis Date  . Anemia   . Arthritis   . C. difficile diarrhea   . DVT (deep venous thrombosis) ([place])    right  . Elevated cholesterol   . Goals of care, counseling/discussion 08/28/2020  . Hypertension   . Iron deficiency anemia due to chronic blood loss 08/28/2020  . 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 Lower leg DVT (deep venous thromboembolism), acute, right ([place]) 09/28/2020  . UC (ulcerative colitis) ([place])      Family History  Problem Relation Age of Onset  . Hypertension Mother   . Diabetes Mother   . Alzheimer's disease Mother   . Parkinson's disease Father   . Alzheimer's disease Father   . Colon cancer Neg Hx   . Esophageal cancer Neg Hx   . Rectal cancer Neg Hx   . Stomach cancer Neg Hx      Social History   Socioeconomic History  . Marital status: Married    Spouse name: Not on file  . Number of children: 2  . Years of education: Not on file  . Highest education level: Not on file  Occupational History  . Occupation: Emergency planning/management officer  Tobacco Use  . Smoking  status: Former Smoker    Packs/day: 1.00    Years: 30.00    Pack years: 30.00    Types: Cigarettes    Quit date: 2001    Years since quitting: 21.2  . Smokeless tobacco: Never Used  Vaping Use  . Vaping Use: Never used  Substance and Sexual Activity  . Alcohol use: Yes    Comment: ocassionally  . Drug use: Not Currently  . Sexual activity: Not on file  Other Topics Concern  . Not on file  Social History Narrative  . 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   No Known Allergies   Outpatient Medications Prior to Visit  Medication Sig Dispense Refill  . apixaban (ELIQUIS) 5 MG TABS tablet Take 1 tablet (51m) twice daily 60 tablet 5  . Bepotastine Besilate 1.5 % SOLN     . Cholecalciferol (DIALYVITE VITAMIN D 5000) 125 MCG (5000 UT) capsule Take 5,000 Units by mouth daily.    . diphenhydrAMINE HCl, Sleep, (ZZZQUIL PO) Take 10 mLs  by mouth daily as needed (sleep).    . folic acid (FOLVITE) 1 MG tablet Take 2 tablets (2 mg total) by mouth daily. 60 tablet 12  . mercaptopurine (PURINETHOL) 50 MG tablet Take 1 tablet po QD. 90 tablet 2  . Multiple Vitamin (MULTIVITAMIN WITH MINERALS) TABS tablet Take 1 tablet by mouth daily.    . NON FORMULARY Take 1 tablet by mouth daily. Vital reds-energy supplement    . predniSONE (DELTASONE) 10 MG tablet Take 2 tablets (20 mg total) by mouth daily with breakfast for 18 days, THEN 1.5 tablets (15 mg total) daily with breakfast for 14 days, THEN 1 tablet (10 mg total) daily with breakfast for 14 days, THEN 0.5 tablets (5 mg total) daily with breakfast for 14 days. 80 tablet 0  . Saccharomyces boulardii (FLORASTOR PO) Take 1 capsule by mouth in the morning  and at bedtime.     No facility-administered medications prior to visit.         Objective:   Physical Exam Vitals:   10/08/20 0954  BP: (!) 160/78  Pulse: (!) 116  Temp: 98 F (36.7 C)  TempSrc: Temporal  SpO2: 98%  Weight: 193 lb 12.8 oz (87.9 kg)  Height: 5' 11"  (1.803 m)   Gen: Pleasant, well-nourished, in no distress,  normal affect  ENT: No lesions,  mouth clear,  oropharynx clear, no postnasal drip  Neck: No JVD, no stridor  Lungs: No use of accessory muscles, no crackles or wheezing on normal respiration, no wheeze on forced expiration  Cardiovascular: RRR, heart sounds normal, no murmur or gallops, no peripheral edema  Musculoskeletal: No deformities, no cyanosis or clubbing  Neuro: alert, awake, non focal  Skin: Warm, no lesions or rash      Assessment & Plan:  Pulmonary nodules/lesions, multiple Multiple pulmonary nodules, probably granulomatous disease in the right upper lobe as it is adjacent to a calcified granuloma.  The right lower lobe superior segmental nodule is less well formed, has a groundglass component.  We will follow his CT in June to look for interval stability, resolution.  If the groundglass nodule persist then he will need follow-up for up to 5 years to ensure no concerning interval change.  We will repeat your CT scan of the chest without contrast in June 2022 to compare with prior. Follow Dr. Lamonte Sakai in June to review your CT scan together  Lower leg DVT (deep venous thromboembolism), acute, right ([place]) You can probably come off of your Eliquis anticoagulation since you have been treated for 3 months.  Continue to discuss with Dr. Marin Olp.  Baltazar Apo, MD, PhD 10/08/2020, 10:37 AM [facility] [phone] or if no answer before 7:00PM call [phone] For any issues after 7:00PM please call eLink [phone]

## 2020-10-16 ENCOUNTER — Ambulatory Visit (HOSPITAL_COMMUNITY)
Admission: RE | Admit: 2020-10-16 | Discharge: 2020-10-16 | Disposition: A | Discharge status: Home / Self Care | Payer: Medicare Other | Source: Ambulatory Visit | Attending: Internal Medicine | Admitting: Internal Medicine

## 2020-10-16 ENCOUNTER — Other Ambulatory Visit: Payer: Self-pay

## 2020-10-16 DIAGNOSIS — K51019 Ulcerative (chronic) pancolitis with unspecified complications: Secondary | ICD-10-CM | POA: Diagnosis present

## 2020-10-16 LAB — COMPREHENSIVE METABOLIC PANEL
ALT: 19 U/L (ref 0–44)
AST: 17 U/L (ref 15–41)
Albumin: 3.1 g/dL — ABNORMAL LOW (ref 3.5–5.0)
Alkaline Phosphatase: 53 U/L (ref 38–126)
Anion gap: 9 (ref 5–15)
BUN: 12 mg/dL (ref 8–23)
CO2: 26 mmol/L (ref 22–32)
Calcium: 8.7 mg/dL — ABNORMAL LOW (ref 8.9–10.3)
Chloride: 105 mmol/L (ref 98–111)
Creatinine, Ser: 0.81 mg/dL (ref 0.61–1.24)
GFR, Estimated: >60 mL/min (ref >60)
Glucose, Bld: 84 mg/dL (ref 70–99)
Potassium: 3.9 mmol/L (ref 3.5–5.1)
Sodium: 140 mmol/L (ref 135–145)
Total Bilirubin: 0.7 mg/dL (ref 0.3–1.2)
Total Protein: 6.4 g/dL — ABNORMAL LOW (ref 6.5–8.1)

## 2020-10-16 LAB — CBC WITH DIFFERENTIAL/PLATELET
Abs Immature Granulocytes: 0.02 10*3/uL (ref 0.00–0.07)
Basophils Absolute: 0 10*3/uL (ref 0.0–0.1)
Basophils Relative: 0 %
Eosinophils Absolute: 0.1 10*3/uL (ref 0.0–0.5)
Eosinophils Relative: 2 %
HCT: 31.1 % — ABNORMAL LOW (ref 39.0–52.0)
Hemoglobin: 8.9 g/dL — ABNORMAL LOW (ref 13.0–17.0)
Immature Granulocytes: 0 %
Lymphocytes Relative: 45 %
Lymphs Abs: 3.4 10*3/uL (ref 0.7–4.0)
MCH: 25 pg — ABNORMAL LOW (ref 26.0–34.0)
MCHC: 28.6 g/dL — ABNORMAL LOW (ref 30.0–36.0)
MCV: 87.4 fL (ref 80.0–100.0)
Monocytes Absolute: 0.8 10*3/uL (ref 0.1–1.0)
Monocytes Relative: 11 %
Neutro Abs: 3.2 10*3/uL (ref 1.7–7.7)
Neutrophils Relative %: 42 %
Platelets: 409 10*3/uL — ABNORMAL HIGH (ref 150–400)
RBC: 3.56 MIL/uL — ABNORMAL LOW (ref 4.22–5.81)
RDW: 18.8 % — ABNORMAL HIGH (ref 11.5–15.5)
WBC: 7.7 10*3/uL (ref 4.0–10.5)
nRBC: 0 % (ref 0.0–0.2)

## 2020-10-16 LAB — SEDIMENTATION RATE: Sed Rate: 75 mm/hr — ABNORMAL HIGH (ref 0–16)

## 2020-10-16 MED ORDER — SODIUM CHLORIDE 0.9 % IV SOLN
10.0000 mg/kg | INTRAVENOUS | Status: DC
  Administered 2020-10-16: 800 mg via INTRAVENOUS
  Filled 2020-10-16: qty 80

## 2020-10-16 MED ORDER — SODIUM CHLORIDE 0.9 % IV SOLN
INTRAVENOUS | Status: DC | PRN
  Administered 2020-10-16: 250 mL via INTRAVENOUS

## 2020-10-16 NOTE — Progress Notes (Signed)
PATIENT CARE CENTER NOTE  Diagnosis:Ulcerative pancolitis with complication ([place]) (W09.417)   Provider:Mansouraty, Valarie Merino, Md   Procedure:Remicade infusionand lab draw   Note:Patient received final loading dose (week 8) ofRemicade infusion via PIV. Infusion titrated per protocol.  Vital signs monitored and remained stable. Patient tolerated well with no adverse reaction. Labs drawn prior to infusion. Patient to get labs drawn prior to each Remicade infusion.  Patient to come every 8 weeks for infusion.Discharge instructions given.Patient alert, oriented and ambulatory at discharge.

## 2020-10-17 LAB — HIGH SENSITIVITY CRP: CRP, High Sensitivity: 26.64 mg/L — ABNORMAL HIGH (ref 0.00–3.00)

## 2020-10-24 ENCOUNTER — Other Ambulatory Visit: Payer: Medicare Other

## 2020-10-24 ENCOUNTER — Ambulatory Visit: Payer: Medicare Other | Admitting: Gastroenterology

## 2020-10-24 ENCOUNTER — Encounter: Payer: Self-pay | Admitting: Gastroenterology

## 2020-10-24 VITALS — BP 138/72 | HR 88 | Ht 69.0 in | Wt 194.0 lb

## 2020-10-24 DIAGNOSIS — D500 Iron deficiency anemia secondary to blood loss (chronic): Secondary | ICD-10-CM | POA: Diagnosis not present

## 2020-10-24 DIAGNOSIS — R5383 Other fatigue: Secondary | ICD-10-CM | POA: Diagnosis not present

## 2020-10-24 DIAGNOSIS — D509 Iron deficiency anemia, unspecified: Secondary | ICD-10-CM

## 2020-10-24 DIAGNOSIS — Z9225 Personal history of immunosupression therapy: Secondary | ICD-10-CM

## 2020-10-24 DIAGNOSIS — K51919 Ulcerative colitis, unspecified with unspecified complications: Secondary | ICD-10-CM

## 2020-10-24 NOTE — Patient Instructions (Addendum)
Your provider has requested that you go to the basement level for lab work before leaving today. STOOL KIT ONLY Press "B" on the elevator. The lab is located at the first door on the left as you exit the elevator.  Infliximab lab will be drawn prior to Remicade Infusion on 12/11/20. If there is an issues with this, pt will come to our lab on 12/10/20 and have lab drawn here. Order has been faxed to [facility], order also placed in [place].   Keep follow-up appt on 01/10/21@ 1:30pm  If you are age 79 or older, your body mass index should be between 23-30. Your Body mass index is 28.65 kg/m. If this is out of the aforementioned range listed, please consider follow up with your Primary Care Provider.  If you are age 79 or younger, your body mass index should be between 19-25. Your Body mass index is 28.65 kg/m. If this is out of the aformentioned range listed, please consider follow up with your Primary Care Provider.    Thank you for choosing me and [facility].  Dr. Rush Landmark

## 2020-10-26 ENCOUNTER — Inpatient Hospital Stay: Payer: Medicare Other | Attending: Hematology & Oncology

## 2020-10-26 ENCOUNTER — Inpatient Hospital Stay (HOSPITAL_BASED_OUTPATIENT_CLINIC_OR_DEPARTMENT_OTHER): Payer: Medicare Other | Admitting: Hematology & Oncology

## 2020-10-26 ENCOUNTER — Encounter: Payer: Self-pay | Admitting: Hematology & Oncology

## 2020-10-26 ENCOUNTER — Other Ambulatory Visit: Payer: Self-pay

## 2020-10-26 ENCOUNTER — Telehealth: Payer: Self-pay

## 2020-10-26 DIAGNOSIS — N189 Chronic kidney disease, unspecified: Secondary | ICD-10-CM | POA: Insufficient documentation

## 2020-10-26 DIAGNOSIS — K922 Gastrointestinal hemorrhage, unspecified: Secondary | ICD-10-CM | POA: Diagnosis not present

## 2020-10-26 DIAGNOSIS — D500 Iron deficiency anemia secondary to blood loss (chronic): Secondary | ICD-10-CM | POA: Insufficient documentation

## 2020-10-26 DIAGNOSIS — D631 Anemia in chronic kidney disease: Secondary | ICD-10-CM | POA: Insufficient documentation

## 2020-10-26 DIAGNOSIS — I824Z1 Acute embolism and thrombosis of unspecified deep veins of right distal lower extremity: Secondary | ICD-10-CM

## 2020-10-26 HISTORY — DX: Anemia in chronic kidney disease: D63.1

## 2020-10-26 LAB — CMP (CANCER CENTER ONLY)
ALT: 12 U/L (ref 0–44)
AST: 11 U/L — ABNORMAL LOW (ref 15–41)
Albumin: 3.6 g/dL (ref 3.5–5.0)
Alkaline Phosphatase: 61 U/L (ref 38–126)
Anion gap: 9 (ref 5–15)
BUN: 17 mg/dL (ref 8–23)
CO2: 28 mmol/L (ref 22–32)
Calcium: 9.2 mg/dL (ref 8.9–10.3)
Chloride: 101 mmol/L (ref 98–111)
Creatinine: 0.81 mg/dL (ref 0.61–1.24)
GFR, Estimated: >60 mL/min (ref >60)
Glucose, Bld: 189 mg/dL — ABNORMAL HIGH (ref 70–99)
Potassium: 4.2 mmol/L (ref 3.5–5.1)
Sodium: 138 mmol/L (ref 135–145)
Total Bilirubin: 0.3 mg/dL (ref 0.3–1.2)
Total Protein: 6.4 g/dL — ABNORMAL LOW (ref 6.5–8.1)

## 2020-10-26 LAB — CBC WITH DIFFERENTIAL (CANCER CENTER ONLY)
Abs Immature Granulocytes: 0.12 10*3/uL — ABNORMAL HIGH (ref 0.00–0.07)
Basophils Absolute: 0 10*3/uL (ref 0.0–0.1)
Basophils Relative: 0 %
Eosinophils Absolute: 0 10*3/uL (ref 0.0–0.5)
Eosinophils Relative: 0 %
HCT: 31.4 % — ABNORMAL LOW (ref 39.0–52.0)
Hemoglobin: 9.3 g/dL — ABNORMAL LOW (ref 13.0–17.0)
Immature Granulocytes: 1 %
Lymphocytes Relative: 24 %
Lymphs Abs: 2.4 10*3/uL (ref 0.7–4.0)
MCH: 25 pg — ABNORMAL LOW (ref 26.0–34.0)
MCHC: 29.6 g/dL — ABNORMAL LOW (ref 30.0–36.0)
MCV: 84.4 fL (ref 80.0–100.0)
Monocytes Absolute: 0.5 10*3/uL (ref 0.1–1.0)
Monocytes Relative: 5 %
Neutro Abs: 6.8 10*3/uL (ref 1.7–7.7)
Neutrophils Relative %: 70 %
Platelet Count: 347 10*3/uL (ref 150–400)
RBC: 3.72 MIL/uL — ABNORMAL LOW (ref 4.22–5.81)
RDW: 18 % — ABNORMAL HIGH (ref 11.5–15.5)
WBC Count: 9.9 10*3/uL (ref 4.0–10.5)
nRBC: 0 % (ref 0.0–0.2)

## 2020-10-26 LAB — RETICULOCYTES
Immature Retic Fract: 14.1 % (ref 2.3–15.9)
RBC.: 3.77 MIL/uL — ABNORMAL LOW (ref 4.22–5.81)
Retic Count, Absolute: 63.3 10*3/uL (ref 19.0–186.0)
Retic Ct Pct: 1.7 % (ref 0.4–3.1)

## 2020-10-26 NOTE — Telephone Encounter (Signed)
Pt aware opf appts per 10/26/20 los   Justin Rosales

## 2020-10-26 NOTE — Progress Notes (Signed)
[facility]IS[facility]   Primary Care Provider Shelda Pal, [address] [place] [address] [phone]  Patient Profile: Justin Rosales is a 70 y.o. male with a pmh significant for chronic left-sided ulcerative colitis (now on 6MP + Remicade), prior C. difficile infection (requiring p.o. vancomycin and fidaxomycin), history of Candida esophagitis, chronic anemia with iron deficiency (s/p IV Iron infusions), hypertension, hyperlipidemia, prior VTE (on anticoagulation still).  The patient presents to the [facility] for an evaluation and management of problem(s) noted below:  Problem List 1. Chronic ulcerative colitis, unspecified complication ([place])   2. Iron deficiency anemia due to chronic blood loss   3. History of immunosuppression therapy   4. Fatigue, unspecified type     History of Present Illness Please see prior notes from the GI team and myself for full details of HPI.  Interval History Today, the patient returns for scheduled follow-up.  Continues to state that he has fatigue and low energy.  However his stools continue to have improvement.  He has been having between 1 and 3 bowel movements per day on a daily basis.  No nocturnal bowel movements.  Urgency continues to improve.  No incontinence.  No blood in stools.  Prednisone taper has been completed.  He has had his full induction of Remicade.  He is set to see Dr. Marin Olp within the next few weeks in effort of trying to see if he can come off anticoagulation for prior VTE.  No abdominal pain.  Weight is stable.  GI Review of Systems Positive as above Negative for odynophagia, dysphagia, pyrosis, melena, hematochezia   Review of Systems General: Denies fevers/chills/weight loss unintentionally HEENT: Denies oral lesions or oral thrush Cardiovascular: Denies chest pain/palpitations Pulmonary: Denies shortness of breath Gastroenterological: See  HPI Genitourinary: Denies darkened urine Hematological: Positive for history of easy bruising/bleeding due to anticoagulation Dermatological: Denies jaundice Psychological: Mood is stable   Medications Current Outpatient Medications  Medication Sig Dispense Refill  . apixaban (ELIQUIS) 5 MG TABS tablet Take 1 tablet (33m) twice daily 60 tablet 5  . Bepotastine Besilate 1.5 % SOLN     . Cholecalciferol (DIALYVITE VITAMIN D 5000) 125 MCG (5000 UT) capsule Take 5,000 Units by mouth daily.    . diphenhydrAMINE HCl, Sleep, (ZZZQUIL PO) Take 10 mLs by mouth daily as needed (sleep).    . folic acid (FOLVITE) 1 MG tablet Take 2 tablets (2 mg total) by mouth daily. 60 tablet 12  . mercaptopurine (PURINETHOL) 50 MG tablet Take 1 tablet po QD. 90 tablet 2  . Multiple Vitamin (MULTIVITAMIN WITH MINERALS) TABS tablet Take 1 tablet by mouth daily.    . NON FORMULARY Take 1 tablet by mouth daily. Vital reds-energy supplement    . predniSONE (DELTASONE) 10 MG tablet Take 2 tablets (20 mg total) by mouth daily with breakfast for 18 days, THEN 1.5 tablets (15 mg total) daily with breakfast for 14 days, THEN 1 tablet (10 mg total) daily with breakfast for 14 days, THEN 0.5 tablets (5 mg total) daily with breakfast for 14 days. 80 tablet 0  . Saccharomyces boulardii (FLORASTOR PO) Take 1 capsule by mouth in the morning and at bedtime.     No current facility-administered medications for this visit.    Allergies No Known Allergies  Histories Past Medical History:  Diagnosis Date  . Anemia   . Arthritis   . C. difficile diarrhea   . DVT (deep venous thrombosis) (  [place])    right  . Elevated cholesterol   . 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 Goals of care, counseling/discussion 08/28/2020  . Hypertension   . Iron deficiency anemia due to chronic blood loss 08/28/2020  . 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 Lower leg DVT (deep venous thromboembolism), acute, right ([place]) 09/28/2020  . UC (ulcerative  colitis) [facility])    Past Surgical History:  Procedure Laterality Date  . COLONOSCOPY     First done at [facility] around age 70. Dr Dorrene German possibly With [facility] x2. last one done around 2012    Social History   Socioeconomic History  . Marital status: Married    Spouse name: Not on file  . Number of children: 2  . Years of education: Not on file  . Highest education level: Not on file  Occupational History  . Occupation: Emergency planning/management officer  Tobacco Use  . Smoking status: Former Smoker    Packs/day: 1.00    Years: 30.00    Pack years: 30.00    Types: Cigarettes    Quit date: 2001    Years since quitting: 21.2  . Smokeless tobacco: Never Used  Vaping Use  . Vaping Use: Never used  Substance and Sexual Activity  . Alcohol use: Yes    Comment: ocassionally  . Drug use: Not Currently  . Sexual activity: Not on file  Other Topics Concern  . Not on file  Social History Narrative  . Not on file   Social Determinants of Health   Financial Resource Strain: Not on file  Food Insecurity: Not on file  Transportation Needs: Not on file  Physical Activity: Not on file  Stress: Not on file  Social Connections: Not on file  Intimate Partner Violence: Not on file   Family History  Problem Relation Age of Onset  . Hypertension Mother   . Diabetes Mother   . Alzheimer's disease Mother   . Parkinson's disease Father   . Alzheimer's disease Father   . Colon cancer Neg Hx   . Esophageal cancer Neg Hx   . Rectal cancer Neg Hx   . Stomach cancer Neg Hx    I have reviewed his medical, social, and family history in detail and updated the electronic medical record as necessary.    PHYSICAL EXAMINATION  BP 138/72   Pulse 88   Ht 5' 9"  (1.753 m)   Wt 194 lb (88 kg)   BMI 28.65 kg/m  Wt Readings from Last 3 Encounters:  10/26/20 202 lb (91.6 kg)  10/24/20 194 lb (88 kg)  10/16/20 196 lb (88.9 kg)  GEN: NAD, appears stated age, accompanied by wife PSYCH:  Cooperative, without pressured speech EYE: Conjunctivae pink, sclerae anicteric ENT: Masked CV: Nontachycardic RESP: No audible wheezing GI: NABS, soft, rounded, NT/ND, without rebound MSK/EXT: No lower extremity edema SKIN: No jaundice NEURO:  Alert & Oriented x 3, no focal deficits   REVIEW OF DATA  I reviewed the following data at the time of this encounter:  GI Procedures and Studies  No new studies to review  Laboratory Studies  Reviewed those in epic  Imaging Studies  No new imaging to review   ASSESSMENT  Mr. Bogue is a 70 y.o. male with a pmh significant for chronic left-sided ulcerative colitis (now on 6MP + Remicade), prior C. difficile infection (requiring p.o. vancomycin and fidaxomycin), history of Candida esophagitis, chronic anemia with iron deficiency (s/p IV Iron infusions), hypertension, hyperlipidemia, prior VTE (on anticoagulation still).  The patient is seen today for evaluation and management of:  1. Chronic ulcerative colitis, unspecified complication ([place])   2. Iron deficiency anemia due to chronic blood loss   3. History of immunosuppression therapy   4. Fatigue, unspecified type    The patient is hemodynamically and clinically stable from a GI standpoint.  Unfortunately, he still has fatigue with evidence of anemia and iron deficiency.  He is having no overt bleeding.  Microscopic blood loss could still be occurring though the patient has completed induction therapy with Remicade.  We will plan to do drug trough levels and antibody assessment prior to next infusion.  We will maintain his current 6-MP dosing.  It is not clear to me that he is anemia remains a result of overt GI blood loss since he has had no overt bleeding at this time.  Appreciate Dr. Marin Olp evaluating patient further to see if anticoagulation is still required for his prior VTE.  Bone marrow suppression should be considered as well.  We will plan to tentatively relook and ensure that mucosal  healing is occurring later this summer.  We will check his fecal inflammatory marker, fecal calprotectin as able.  All patient questions were answered to the best of my ability, and the patient agrees to the aforementioned plan of action with follow-up as indicated.   PLAN  Continue Remicade to 8 weeks for now Drug trough and antibody assessment prior to next infusion Continue 6-MP 58 mg daily Continue protein supplementation Appreciate hematology follow-up and further IV iron infusions as necessary and further work-up of anemia as well as need for anticoagulation  Likely plan to repeat endoscopic evaluation this summer after his next infusion if we do not see anemia continues to improve in effort of ensuring mucosal healing   Orders Placed This Encounter  Procedures  . Calprotectin, Fecal  . Infliximab+Ab (Serial Monitor)    New Prescriptions   No medications on file   Modified Medications   No medications on file    Planned Follow Up No follow-ups on file.   Total Time in Face-to-Face and in Coordination of Care for patient including independent/personal interpretation/review of prior testing, medical history, examination, medication adjustment, communicating results with the patient directly, and documentation with the EHR is 25 minutes.   Justice Britain, MD [facility] Advanced [facility]oscopy Office # [phone]

## 2020-10-26 NOTE — Progress Notes (Signed)
Hematology and Oncology Follow Up Visit  Ashaad Gaertner [id number] Jan 02, 1951 70 y.o. 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 twice daily-to complete 6 months of therapy in June 2022  Aranesp 300 mcg sq for Hgb <11     Interim History:  Mr. Berns is back for follow-up.  He still feels tired.  We still have not been able to get his blood up as much as I would like.  His hemoglobin is 9.3.  I have checked his erythropoietin level in the past.  His erythropoietin level was 80.  I think this is low for him.  As such, we might want to consider doing Aranesp.  He is on Eliquis.  He is on 5 mg twice a day.  This is quite expensive for him.  We will see if we can somehow get something less expensive for him.  He did have a Doppler back in 09/28/2020.  This did not show a thrombus in his right leg.  I am worried about stopping anticoagulation on him because of the ulcerative colitis.  This could certainly lead to increased problem embolic disease.  We may want to try to decrease the dose of anticoagulation.  He has had no obvious bleeding.  He is on Remicade.    He has had no rashes.  He has had no fever.  There is no cough or shortness of breath.  Of note, his last iron saturation was only 5%.  We will be interesting to see what this level is.  We may have to consider giving him more iron.  Again I am not sure what his insurance will cover.  Currently, his performance status is ECOG 1.  Medications:  Current Outpatient Medications:  .  apixaban (ELIQUIS) 5 MG TABS tablet, Take 1 tablet (8m) twice daily, Disp: 60 tablet, Rfl: 5 .  Bepotastine Besilate 1.5 % SOLN, , Disp: , Rfl:  .  Cholecalciferol (DIALYVITE VITAMIN D 5000) 125 MCG  (5000 UT) capsule, Take 5,000 Units by mouth daily., Disp: , Rfl:  .  diphenhydrAMINE HCl, Sleep, (ZZZQUIL PO), Take 10 mLs by mouth daily as needed (sleep)., Disp: , Rfl:  .  folic acid (FOLVITE) 1 MG tablet, Take 2 tablets (2 mg total) by mouth daily., Disp: 60 tablet, Rfl: 12 .  mercaptopurine (PURINETHOL) 50 MG tablet, Take 1 tablet po QD., Disp: 90 tablet, Rfl: 2 .  Multiple Vitamin (MULTIVITAMIN WITH MINERALS) TABS tablet, Take 1 tablet by mouth daily., Disp: , Rfl:  .  NON FORMULARY, Take 1 tablet by mouth daily. Vital reds-energy supplement, Disp: , Rfl:  .  predniSONE (DELTASONE) 10 MG tablet, Take 2 tablets (20 mg total) by mouth daily with breakfast for 18 days, THEN 1.5 tablets (15 mg total) daily with breakfast for 14 days, THEN 1 tablet (10 mg total) daily with breakfast for 14 days, THEN 0.5 tablets (5 mg total) daily with breakfast for 14 days., Disp: 80 tablet, Rfl: 0 .  Saccharomyces boulardii (FLORASTOR PO), Take 1 capsule by mouth in the morning and at bedtime., Disp: , Rfl:   Allergies: No Known Allergies  Past Medical History, Surgical history, Social history, and Family History were reviewed and updated.  Review of Systems: Review of Systems  Constitutional: Positive for fatigue.  HENT:  Negative.   Eyes: Negative.   Respiratory: Positive for shortness of breath.   Cardiovascular: Negative.   Gastrointestinal: Positive for blood in stool and diarrhea.  Endocrine: Negative.   Genitourinary: Negative.    Musculoskeletal: Negative.   Skin: Negative.   Neurological: Negative.   Hematological: Negative.   Psychiatric/Behavioral: Negative.     Physical Exam:  weight is 202 lb (91.6 kg). His oral temperature is 97.9 F (36.6 C). His blood pressure is 164/73 (abnormal) and his pulse is 106 (abnormal). His respiration is 19 and oxygen saturation is 97%.   Wt Readings from Last 3 Encounters:  10/26/20 202 lb (91.6 kg)  10/24/20 194 lb (88 kg)  10/16/20 196 lb (88.9  kg)    Physical Exam Vitals reviewed.  HENT:     Head: Normocephalic and atraumatic.  Eyes:     Pupils: Pupils are equal, round, and reactive to light.  Cardiovascular:     Rate and Rhythm: Normal rate and regular rhythm.     Heart sounds: Normal heart sounds.  Pulmonary:     Effort: Pulmonary effort is normal.     Breath sounds: Normal breath sounds.  Abdominal:     General: Bowel sounds are normal.     Palpations: Abdomen is soft.  Musculoskeletal:        General: No tenderness or deformity. Normal range of motion.     Cervical back: Normal range of motion.  Lymphadenopathy:     Cervical: No cervical adenopathy.  Skin:    General: Skin is warm and dry.     Findings: No erythema or rash.  Neurological:     Mental Status: He is alert and oriented to person, place, and time.  Psychiatric:        Behavior: Behavior normal.        Thought Content: Thought content normal.        Judgment: Judgment normal.    Lab Results  Component Value Date   WBC 9.9 10/26/2020   HGB 9.3 (L) 10/26/2020   HCT 31.4 (L) 10/26/2020   MCV 84.4 10/26/2020   PLT 347 10/26/2020     Chemistry      Component Value Date/Time   NA 138 10/26/2020 1435   K 4.2 10/26/2020 1435   CL 101 10/26/2020 1435   CO2 28 10/26/2020 1435   BUN 17 10/26/2020 1435   CREATININE 0.81 10/26/2020 1435      Component Value Date/Time   CALCIUM 9.2 10/26/2020 1435   ALKPHOS 61 10/26/2020 1435   AST 11 (L) 10/26/2020 1435   ALT 12 10/26/2020 1435   BILITOT 0.3 10/26/2020 1435       Impression and Plan: Mr. Heavner is a very nice 70 year old white male.  He has ulcerative colitis.  He had Clostridium diarrhea.  He became profoundly iron deficient.  We are given his iron back.  This is improving his hemoglobin.  Unfortunately, he just is not feeling as good as I thought he would feel.  Again we will have to see about archive ESA on him.  We will see what his iron level is.  We may have to think about iron  dextran if his iron saturation is still quite low.  I would like to have him come back in a month.  I think we have to stay close on top of this so that we can try to get his blood count better so he will feel better.  [name], MD 4/8/20223:28 PM

## 2020-10-27 ENCOUNTER — Encounter: Payer: Self-pay | Admitting: Gastroenterology

## 2020-10-27 DIAGNOSIS — K51919 Ulcerative colitis, unspecified with unspecified complications: Secondary | ICD-10-CM | POA: Insufficient documentation

## 2020-10-29 ENCOUNTER — Telehealth: Payer: Self-pay | Admitting: *Deleted

## 2020-10-29 LAB — FERRITIN: Ferritin: 194 ng/mL (ref 24–336)

## 2020-10-29 LAB — IRON AND TIBC
Iron: 18 ug/dL — ABNORMAL LOW (ref 42–163)
Saturation Ratios: 7 % — ABNORMAL LOW (ref 20–55)
TIBC: 243 ug/dL (ref 202–409)
UIBC: 225 ug/dL (ref 117–376)

## 2020-10-29 NOTE — Addendum Note (Signed)
Addended by: Burney Gauze R on: 10/29/2020 05:13 PM   Modules accepted: Orders

## 2020-10-29 NOTE — Telephone Encounter (Signed)
-----   Message from Justin Napoleon, MD sent at 10/29/2020 11:07 AM EDT ----- Call - the iron is very low -- still!!!  What iron have we been giving him??  Justin Rosales

## 2020-10-29 NOTE — Telephone Encounter (Signed)
Per MD, notified pt of lab results. Pt currently getting Venofer last dose 3/18- Iron levels are still low. Per MD note, pt may need to switch to a different Iron infusion.   Advised pt to expect a call from scheduling regarding next IV Iron infusion. Message to Provider with above information.

## 2020-10-30 ENCOUNTER — Telehealth: Payer: Self-pay | Admitting: *Deleted

## 2020-10-30 ENCOUNTER — Other Ambulatory Visit: Payer: Medicare Other

## 2020-10-30 ENCOUNTER — Telehealth: Payer: Self-pay

## 2020-10-30 DIAGNOSIS — K51919 Ulcerative colitis, unspecified with unspecified complications: Secondary | ICD-10-CM

## 2020-10-30 DIAGNOSIS — D500 Iron deficiency anemia secondary to blood loss (chronic): Secondary | ICD-10-CM

## 2020-10-30 NOTE — Telephone Encounter (Signed)
Message to scheduling per MD for pt to receive 1 dose of IV Iron- Monoferric this week or next week.

## 2020-10-30 NOTE — Telephone Encounter (Signed)
Recd message from [facility]. Pt will need to have Infliximab+ Ab serial lab done here at office prior to Remicade infusion because their lab can not run lab in house. Pt has been informed and will have lab drawn here prior to next Remicade infusion 12/11/20. Pt voiced understanding. Order has been placed in epic.

## 2020-11-01 LAB — CALPROTECTIN, FECAL: Calprotectin, Fecal: 1938 ug/g — ABNORMAL HIGH (ref 0–120)

## 2020-11-02 ENCOUNTER — Inpatient Hospital Stay: Payer: Medicare Other

## 2020-11-02 ENCOUNTER — Other Ambulatory Visit: Payer: Self-pay

## 2020-11-02 ENCOUNTER — Ambulatory Visit: Payer: Medicare Other

## 2020-11-02 VITALS — BP 156/74 | HR 95 | Temp 98.3°F | Resp 16

## 2020-11-02 DIAGNOSIS — D631 Anemia in chronic kidney disease: Secondary | ICD-10-CM

## 2020-11-02 DIAGNOSIS — N189 Chronic kidney disease, unspecified: Secondary | ICD-10-CM | POA: Diagnosis not present

## 2020-11-02 DIAGNOSIS — D500 Iron deficiency anemia secondary to blood loss (chronic): Secondary | ICD-10-CM

## 2020-11-02 DIAGNOSIS — K909 Intestinal malabsorption, unspecified: Secondary | ICD-10-CM

## 2020-11-02 MED ORDER — EPOETIN ALFA-EPBX 40000 UNIT/ML IJ SOLN
INTRAMUSCULAR | Status: AC
  Filled 2020-11-02: qty 1

## 2020-11-02 MED ORDER — EPOETIN ALFA-EPBX 40000 UNIT/ML IJ SOLN
40000.0000 [IU] | Freq: Once | INTRAMUSCULAR | Status: AC
  Administered 2020-11-02: 40000 [IU] via SUBCUTANEOUS

## 2020-11-02 NOTE — Progress Notes (Signed)
Patient would like to get injection today and Iron next week due to schedule.

## 2020-11-02 NOTE — Progress Notes (Signed)
Intravenous Iron Formulation Change  Mr Hemmelgarn has insurance that requires a change in intravenous iron product from Monoferric to Ferrlecit. Orders have been updated to reflect this change and scheduling message sent to adjust infusion appointments. Dr Marin Olp notified and agrees with the plan.  Dr Marin Olp reports that Venofer did not provide patient with acceptable response.  Allergies: No Known Allergies  The plan for iron therapy is as follows: Ferrlecit 250 mg IVPB x 2 doses  Wynona Neat 11/02/2020

## 2020-11-02 NOTE — Patient Instructions (Signed)

## 2020-11-02 NOTE — Progress Notes (Signed)
The following biosimilar Retacrit (epoetin alpha-epbx) has been selected for use in this patient per patient insurance requirement.  Henreitta Leber, PharmD 11/02/20 @ 0900

## 2020-11-05 ENCOUNTER — Other Ambulatory Visit: Payer: Self-pay

## 2020-11-05 DIAGNOSIS — K51919 Ulcerative colitis, unspecified with unspecified complications: Secondary | ICD-10-CM

## 2020-11-09 ENCOUNTER — Inpatient Hospital Stay: Payer: Medicare Other

## 2020-11-09 VITALS — BP 152/79 | HR 82 | Temp 98.1°F | Resp 17

## 2020-11-09 DIAGNOSIS — N189 Chronic kidney disease, unspecified: Secondary | ICD-10-CM | POA: Diagnosis not present

## 2020-11-09 DIAGNOSIS — D500 Iron deficiency anemia secondary to blood loss (chronic): Secondary | ICD-10-CM

## 2020-11-09 DIAGNOSIS — K909 Intestinal malabsorption, unspecified: Secondary | ICD-10-CM

## 2020-11-09 DIAGNOSIS — D631 Anemia in chronic kidney disease: Secondary | ICD-10-CM

## 2020-11-09 MED ORDER — SODIUM CHLORIDE 0.9 % IV SOLN
Freq: Once | INTRAVENOUS | Status: AC
  Filled 2020-11-09: qty 250

## 2020-11-09 MED ORDER — SODIUM CHLORIDE 0.9 % IV SOLN
250.0000 mg | Freq: Once | INTRAVENOUS | Status: AC
  Administered 2020-11-09: 250 mg via INTRAVENOUS
  Filled 2020-11-09: qty 20

## 2020-11-09 NOTE — Patient Instructions (Signed)
Sodium Ferric Gluconate Complex injection What is this medicine? SODIUM FERRIC GLUCONATE COMPLEX (SOE dee um FER ik GLOO koe nate KOM pleks) is an iron replacement. It is used with epoetin therapy to treat low iron levels in patients who are receiving hemodialysis. This medicine may be used for other purposes; ask your health care provider or pharmacist if you have questions. COMMON BRAND NAME(S): Ferrlecit, Nulecit What should I tell my health care provider before I take this medicine? They need to know if you have any of the following conditions:  anemia that is not from iron deficiency  high levels of iron in the body  an unusual or allergic reaction to iron, benzyl alcohol, other medicines, foods, dyes, or preservatives  pregnant or are trying to become pregnant  breast-feeding How should I use this medicine? This medicine is for infusion into a vein. It is given by a health care professional in a hospital or clinic setting. Talk to your pediatrician regarding the use of this medicine in children. While this drug may be prescribed for children as young as 6 years old for selected conditions, precautions do apply. Overdosage: If you think you have taken too much of this medicine contact a poison control center or emergency room at once. NOTE: This medicine is only for you. Do not share this medicine with others. What if I miss a dose? It is important not to miss your dose. Call your doctor or health care professional if you are unable to keep an appointment. What may interact with this medicine? Do not take this medicine with any of the following medications:  deferoxamine  dimercaprol  other iron products This medicine may also interact with the following medications:  chloramphenicol  deferasirox  medicine for blood pressure like enalapril This list may not describe all possible interactions. Give your health care provider a list of all the medicines, herbs,  non-prescription drugs, or dietary supplements you use. Also tell them if you smoke, drink alcohol, or use illegal drugs. Some items may interact with your medicine. What should I watch for while using this medicine? Your condition will be monitored carefully while you are receiving this medicine. Visit your doctor for check-ups as directed. What side effects may I notice from receiving this medicine? Side effects that you should report to your doctor or health care professional as soon as possible:  allergic reactions like skin rash, itching or hives, swelling of the face, lips, or tongue  breathing problems  changes in hearing  changes in vision  chills, flushing, or sweating  fast, irregular heartbeat  feeling faint or lightheaded, falls  fever, flu-like symptoms  high or low blood pressure  pain, tingling, numbness in the hands or feet  severe pain in the chest, back, flanks, or groin  swelling of the ankles, feet, hands  trouble passing urine or change in the amount of urine  unusually weak or tired Side effects that usually do not require medical attention (report to your doctor or health care professional if they continue or are bothersome):  cramps  dark colored stools  diarrhea  headache  nausea, vomiting  stomach upset This list may not describe all possible side effects. Call your doctor for medical advice about side effects. You may report side effects to FDA at 1-800-FDA-1088. Where should I keep my medicine? This drug is given in a hospital or clinic and will not be stored at home. NOTE: This sheet is a summary. It may not cover all   possible information. If you have questions about this medicine, talk to your doctor, pharmacist, or health care provider.  2021 Elsevier/Gold Standard (2008-03-08 15:58:57)   

## 2020-11-16 ENCOUNTER — Inpatient Hospital Stay: Payer: Medicare Other

## 2020-11-16 ENCOUNTER — Other Ambulatory Visit: Payer: Self-pay

## 2020-11-16 VITALS — BP 137/70 | HR 85 | Temp 97.5°F | Resp 17

## 2020-11-16 DIAGNOSIS — N189 Chronic kidney disease, unspecified: Secondary | ICD-10-CM | POA: Diagnosis not present

## 2020-11-16 DIAGNOSIS — D631 Anemia in chronic kidney disease: Secondary | ICD-10-CM

## 2020-11-16 DIAGNOSIS — K909 Intestinal malabsorption, unspecified: Secondary | ICD-10-CM

## 2020-11-16 DIAGNOSIS — D500 Iron deficiency anemia secondary to blood loss (chronic): Secondary | ICD-10-CM

## 2020-11-16 MED ORDER — NA FERRIC GLUC CPLX IN SUCROSE 12.5 MG/ML IV SOLN
250.0000 mg | Freq: Once | INTRAVENOUS | Status: AC
  Administered 2020-11-16: 250 mg via INTRAVENOUS
  Filled 2020-11-16: qty 20

## 2020-11-16 MED ORDER — LORATADINE 10 MG PO TABS
10.0000 mg | ORAL_TABLET | Freq: Once | ORAL | Status: DC
  Filled 2020-11-16: qty 1

## 2020-11-16 MED ORDER — ACETAMINOPHEN 325 MG PO TABS
ORAL_TABLET | ORAL | Status: AC
  Filled 2020-11-16: qty 2

## 2020-11-16 MED ORDER — ACETAMINOPHEN 325 MG PO TABS: 650.0000 mg | ORAL_TABLET | Freq: Once | ORAL | Status: DC

## 2020-11-16 MED ORDER — SODIUM CHLORIDE 0.9 % IV SOLN
Freq: Once | INTRAVENOUS | Status: AC
  Filled 2020-11-16: qty 250

## 2020-11-16 NOTE — Progress Notes (Signed)
Pt. Refused to wait 30 minutes pot infusion. Stable and ASX upon discharge.

## 2020-11-22 ENCOUNTER — Inpatient Hospital Stay (HOSPITAL_BASED_OUTPATIENT_CLINIC_OR_DEPARTMENT_OTHER): Payer: Medicare Other | Admitting: Hematology & Oncology

## 2020-11-22 ENCOUNTER — Inpatient Hospital Stay: Payer: Medicare Other | Attending: Hematology & Oncology

## 2020-11-22 ENCOUNTER — Inpatient Hospital Stay: Payer: Medicare Other

## 2020-11-22 ENCOUNTER — Encounter: Payer: Self-pay | Admitting: Hematology & Oncology

## 2020-11-22 ENCOUNTER — Other Ambulatory Visit: Payer: Self-pay

## 2020-11-22 VITALS — BP 149/60 | HR 96 | Temp 98.5°F | Resp 20 | Wt 195.0 lb

## 2020-11-22 DIAGNOSIS — K5190 Ulcerative colitis, unspecified, without complications: Secondary | ICD-10-CM | POA: Insufficient documentation

## 2020-11-22 DIAGNOSIS — Z86718 Personal history of other venous thrombosis and embolism: Secondary | ICD-10-CM | POA: Diagnosis not present

## 2020-11-22 DIAGNOSIS — D631 Anemia in chronic kidney disease: Secondary | ICD-10-CM | POA: Diagnosis not present

## 2020-11-22 DIAGNOSIS — D500 Iron deficiency anemia secondary to blood loss (chronic): Secondary | ICD-10-CM | POA: Diagnosis not present

## 2020-11-22 DIAGNOSIS — K909 Intestinal malabsorption, unspecified: Secondary | ICD-10-CM | POA: Insufficient documentation

## 2020-11-22 DIAGNOSIS — N189 Chronic kidney disease, unspecified: Secondary | ICD-10-CM | POA: Diagnosis not present

## 2020-11-22 DIAGNOSIS — Z7901 Long term (current) use of anticoagulants: Secondary | ICD-10-CM | POA: Diagnosis not present

## 2020-11-22 DIAGNOSIS — Z79899 Other long term (current) drug therapy: Secondary | ICD-10-CM | POA: Diagnosis not present

## 2020-11-22 LAB — CMP (CANCER CENTER ONLY)
ALT: 11 U/L (ref 0–44)
AST: 12 U/L — ABNORMAL LOW (ref 15–41)
Albumin: 3.7 g/dL (ref 3.5–5.0)
Alkaline Phosphatase: 70 U/L (ref 38–126)
Anion gap: 8 (ref 5–15)
BUN: 15 mg/dL (ref 8–23)
CO2: 29 mmol/L (ref 22–32)
Calcium: 9.4 mg/dL (ref 8.9–10.3)
Chloride: 103 mmol/L (ref 98–111)
Creatinine: 0.89 mg/dL (ref 0.61–1.24)
GFR, Estimated: >60 mL/min (ref >60)
Glucose, Bld: 94 mg/dL (ref 70–99)
Potassium: 4.5 mmol/L (ref 3.5–5.1)
Sodium: 140 mmol/L (ref 135–145)
Total Bilirubin: 0.3 mg/dL (ref 0.3–1.2)
Total Protein: 6.5 g/dL (ref 6.5–8.1)

## 2020-11-22 LAB — CBC WITH DIFFERENTIAL (CANCER CENTER ONLY)
Abs Immature Granulocytes: 0 10*3/uL (ref 0.00–0.07)
Band Neutrophils: 1 %
Basophils Absolute: 0 10*3/uL (ref 0.0–0.1)
Basophils Relative: 0 %
Eosinophils Absolute: 0.1 10*3/uL (ref 0.0–0.5)
Eosinophils Relative: 2 %
HCT: 33.7 % — ABNORMAL LOW (ref 39.0–52.0)
Hemoglobin: 10.4 g/dL — ABNORMAL LOW (ref 13.0–17.0)
Lymphocytes Relative: 58 %
Lymphs Abs: 3.5 10*3/uL (ref 0.7–4.0)
MCH: 27.4 pg (ref 26.0–34.0)
MCHC: 30.9 g/dL (ref 30.0–36.0)
MCV: 88.7 fL (ref 80.0–100.0)
Monocytes Absolute: 0.8 10*3/uL (ref 0.1–1.0)
Monocytes Relative: 14 %
Neutro Abs: 1.6 10*3/uL — ABNORMAL LOW (ref 1.7–7.7)
Neutrophils Relative %: 25 %
Platelet Count: 292 10*3/uL (ref 150–400)
RBC: 3.8 MIL/uL — ABNORMAL LOW (ref 4.22–5.81)
RDW: 19.4 % — ABNORMAL HIGH (ref 11.5–15.5)
WBC Count: 6 10*3/uL (ref 4.0–10.5)
nRBC: 0 % (ref 0.0–0.2)

## 2020-11-22 LAB — RETICULOCYTES
Immature Retic Fract: 13.9 % (ref 2.3–15.9)
RBC.: 3.78 MIL/uL — ABNORMAL LOW (ref 4.22–5.81)
Retic Count, Absolute: 49.9 10*3/uL (ref 19.0–186.0)
Retic Ct Pct: 1.3 % (ref 0.4–3.1)

## 2020-11-22 MED ORDER — EPOETIN ALFA-EPBX 40000 UNIT/ML IJ SOLN
INTRAMUSCULAR | Status: AC
  Filled 2020-11-22: qty 1

## 2020-11-22 MED ORDER — EPOETIN ALFA-EPBX 40000 UNIT/ML IJ SOLN
40000.0000 [IU] | Freq: Once | INTRAMUSCULAR | Status: AC
Start: 2020-11-22 — End: 2020-11-22
  Administered 2020-11-22: 40000 [IU] via SUBCUTANEOUS

## 2020-11-22 NOTE — Progress Notes (Signed)
Hematology and Oncology Follow Up Visit  Rakeem Colley [id number] May 20, 1951 70 y.o. 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 twice daily-to complete 6 months of therapy in June 2022  Retacrit 40,000 units subcu weekly for Hgb <11     Interim History:  Mr. Greek is back for follow-up.  He finally is responding to treatment.  Back in early April, his iron saturation was only 7%.  We did go ahead and give him 2 doses of IV iron.  This has helped.  The Aranesp also has helped.  His hemoglobin is now above 10.  He feels better.  He has a bit more energy.  He has had no problems with actual bleeding.  He is on Eliquis.  He does have a lot of ecchymoses on his arms.  He really wants to try to get off the Eliquis.  Hopefully we can get him off the Eliquis in June.  He has had no problems with the ulcerative colitis.  He has had no diarrhea.  He has had no issues with fever.  He has had no cough.  There is been no chest wall pain.  He has had no problems with headache.  Overall, his performance status is ECOG 1.  Medications:  Current Outpatient Medications:  .  apixaban (ELIQUIS) 5 MG TABS tablet, Take 1 tablet (50m) twice daily, Disp: 60 tablet, Rfl: 5 .  Bepotastine Besilate 1.5 % SOLN, 1 drop 2 (two) times daily., Disp: , Rfl:  .  Cholecalciferol (DIALYVITE VITAMIN D 5000) 125 MCG (5000 UT) capsule, Take 5,000 Units by mouth daily., Disp: , Rfl:  .  diphenhydrAMINE HCl, Sleep, (ZZZQUIL PO), Take 10 mLs by mouth daily as needed (sleep)., Disp: , Rfl:  .  folic acid (FOLVITE) 1 MG tablet, Take 2 tablets (2 mg total) by mouth daily., Disp: 60 tablet, Rfl: 12 .  mercaptopurine (PURINETHOL) 50 MG tablet, Take 1 tablet po QD., Disp: 90  tablet, Rfl: 2 .  Multiple Vitamin (MULTIVITAMIN WITH MINERALS) TABS tablet, Take 1 tablet by mouth daily., Disp: , Rfl:  .  NON FORMULARY, Take 1 tablet by mouth daily. Vital reds-energy supplement, Disp: , Rfl:  .  Saccharomyces boulardii (FLORASTOR PO), Take 1 capsule by mouth in the morning and at bedtime., Disp: , Rfl:   Allergies: No Known Allergies  Past Medical History, Surgical history, Social history, and Family History were reviewed and updated.  Review of Systems: Review of Systems  Constitutional: Positive for fatigue.  HENT:  Negative.   Eyes: Negative.   Respiratory: Positive for shortness of breath.   Cardiovascular: Negative.   Gastrointestinal: Positive for blood in stool and diarrhea.  Endocrine: Negative.   Genitourinary: Negative.    Musculoskeletal: Negative.   Skin: Negative.   Neurological: Negative.   Hematological: Negative.   Psychiatric/Behavioral: Negative.     Physical Exam:  weight is 195 lb (88.5 kg). His oral temperature is 98.5 F (36.9 C). His blood pressure is 149/60 (abnormal) and his pulse is 96. His respiration is 20 and oxygen saturation is 100%.   Wt Readings from Last 3 Encounters:  11/22/20 195 lb (88.5 kg)  10/26/20 202 lb (91.6 kg)  10/24/20 194 lb (88 kg)    Physical Exam  Vitals reviewed.  HENT:     Head: Normocephalic and atraumatic.  Eyes:     Pupils: Pupils are equal, round, and reactive to light.  Cardiovascular:     Rate and Rhythm: Normal rate and regular rhythm.     Heart sounds: Normal heart sounds.  Pulmonary:     Effort: Pulmonary effort is normal.     Breath sounds: Normal breath sounds.  Abdominal:     General: Bowel sounds are normal.     Palpations: Abdomen is soft.  Musculoskeletal:        General: No tenderness or deformity. Normal range of motion.     Cervical back: Normal range of motion.  Lymphadenopathy:     Cervical: No cervical adenopathy.  Skin:    General: Skin is warm and dry.      Findings: No erythema or rash.  Neurological:     Mental Status: He is alert and oriented to person, place, and time.  Psychiatric:        Behavior: Behavior normal.        Thought Content: Thought content normal.        Judgment: Judgment normal.    Lab Results  Component Value Date   WBC 6.0 11/22/2020   HGB 10.4 (L) 11/22/2020   HCT 33.7 (L) 11/22/2020   MCV 88.7 11/22/2020   PLT 292 11/22/2020     Chemistry      Component Value Date/Time   NA 140 11/22/2020 0741   K 4.5 11/22/2020 0741   CL 103 11/22/2020 0741   CO2 29 11/22/2020 0741   BUN 15 11/22/2020 0741   CREATININE 0.89 11/22/2020 0741      Component Value Date/Time   CALCIUM 9.4 11/22/2020 0741   ALKPHOS 70 11/22/2020 0741   AST 12 (L) 11/22/2020 0741   ALT 11 11/22/2020 0741   BILITOT 0.3 11/22/2020 0741       Impression and Plan: Mr. Petrak is a very nice 70 year old white male.  He has ulcerative colitis.  He had Clostridium diarrhea.  He became profoundly iron deficient.  We are given his iron back.  He is on Retacrit.  I hate that we have to use Retacrit not Aranesp.  He has to come back more often because of the Retacrit.  We will see what his iron studies look like.  We will give him the Retacrit today.    I would like to have him come back in a month.  I think we have to stay close on top of this so that we can try to get his blood count better so he will feel better.   [name], MD 5/5/20228:27 AM

## 2020-11-22 NOTE — Patient Instructions (Signed)

## 2020-11-23 ENCOUNTER — Telehealth: Payer: Self-pay

## 2020-11-23 LAB — IRON AND TIBC
Iron: 35 ug/dL — ABNORMAL LOW (ref 45–182)
Saturation Ratios: 14 % — ABNORMAL LOW (ref 17.9–39.5)
TIBC: 256 ug/dL (ref 250–450)
UIBC: 221 ug/dL

## 2020-11-23 LAB — FERRITIN: Ferritin: 347 ng/mL — ABNORMAL HIGH (ref 24–336)

## 2020-11-23 NOTE — Telephone Encounter (Signed)
-----   Message from [name], MD sent at 11/23/2020 12:35 PM EDT ----- Call - the iron is low but better!!  Need another dose of IV iron!  [name]

## 2020-11-23 NOTE — Telephone Encounter (Signed)
Called and informed patient of lab results, he verbalized understanding and understands scheduling will call to get his next iv iron set up

## 2020-11-26 ENCOUNTER — Other Ambulatory Visit: Payer: Medicare Other

## 2020-11-26 ENCOUNTER — Other Ambulatory Visit: Payer: Self-pay | Admitting: Family

## 2020-11-26 ENCOUNTER — Telehealth: Payer: Self-pay

## 2020-11-26 DIAGNOSIS — K51919 Ulcerative colitis, unspecified with unspecified complications: Secondary | ICD-10-CM

## 2020-11-26 NOTE — Telephone Encounter (Signed)
Returned pts call to sch iv iron per chart note   Justin Rosales

## 2020-11-28 ENCOUNTER — Telehealth: Payer: Self-pay

## 2020-11-28 NOTE — Telephone Encounter (Signed)
Pt called to r/s his 6/3 appt to 6/6   Justin Rosales

## 2020-11-30 ENCOUNTER — Inpatient Hospital Stay: Payer: Medicare Other

## 2020-11-30 ENCOUNTER — Other Ambulatory Visit: Payer: Self-pay

## 2020-11-30 VITALS — BP 151/77 | HR 72 | Temp 98.0°F | Resp 18

## 2020-11-30 DIAGNOSIS — N189 Chronic kidney disease, unspecified: Secondary | ICD-10-CM | POA: Diagnosis not present

## 2020-11-30 DIAGNOSIS — D500 Iron deficiency anemia secondary to blood loss (chronic): Secondary | ICD-10-CM

## 2020-11-30 DIAGNOSIS — K909 Intestinal malabsorption, unspecified: Secondary | ICD-10-CM

## 2020-11-30 DIAGNOSIS — D631 Anemia in chronic kidney disease: Secondary | ICD-10-CM

## 2020-11-30 MED ORDER — SODIUM CHLORIDE 0.9 % IV SOLN
250.0000 mg | Freq: Once | INTRAVENOUS | Status: AC
  Administered 2020-11-30: 250 mg via INTRAVENOUS
  Filled 2020-11-30: qty 20

## 2020-11-30 MED ORDER — SODIUM CHLORIDE 0.9 % IV SOLN
Freq: Once | INTRAVENOUS | Status: AC
  Filled 2020-11-30: qty 250

## 2020-11-30 NOTE — Patient Instructions (Signed)
Sodium Ferric Gluconate Complex injection What is this medicine? SODIUM FERRIC GLUCONATE COMPLEX (SOE dee um FER ik GLOO koe nate KOM pleks) is an iron replacement. It is used with epoetin therapy to treat low iron levels in patients who are receiving hemodialysis. This medicine may be used for other purposes; ask your health care provider or pharmacist if you have questions. COMMON BRAND NAME(S): Ferrlecit, Nulecit What should I tell my health care provider before I take this medicine? They need to know if you have any of the following conditions:  anemia that is not from iron deficiency  high levels of iron in the body  an unusual or allergic reaction to iron, benzyl alcohol, other medicines, foods, dyes, or preservatives  pregnant or are trying to become pregnant  breast-feeding How should I use this medicine? This medicine is for infusion into a vein. It is given by a health care professional in a hospital or clinic setting. Talk to your pediatrician regarding the use of this medicine in children. While this drug may be prescribed for children as young as 6 years old for selected conditions, precautions do apply. Overdosage: If you think you have taken too much of this medicine contact a poison control center or emergency room at once. NOTE: This medicine is only for you. Do not share this medicine with others. What if I miss a dose? It is important not to miss your dose. Call your doctor or health care professional if you are unable to keep an appointment. What may interact with this medicine? Do not take this medicine with any of the following medications:  deferoxamine  dimercaprol  other iron products This medicine may also interact with the following medications:  chloramphenicol  deferasirox  medicine for blood pressure like enalapril This list may not describe all possible interactions. Give your health care provider a list of all the medicines, herbs,  non-prescription drugs, or dietary supplements you use. Also tell them if you smoke, drink alcohol, or use illegal drugs. Some items may interact with your medicine. What should I watch for while using this medicine? Your condition will be monitored carefully while you are receiving this medicine. Visit your doctor for check-ups as directed. What side effects may I notice from receiving this medicine? Side effects that you should report to your doctor or health care professional as soon as possible:  allergic reactions like skin rash, itching or hives, swelling of the face, lips, or tongue  breathing problems  changes in hearing  changes in vision  chills, flushing, or sweating  fast, irregular heartbeat  feeling faint or lightheaded, falls  fever, flu-like symptoms  high or low blood pressure  pain, tingling, numbness in the hands or feet  severe pain in the chest, back, flanks, or groin  swelling of the ankles, feet, hands  trouble passing urine or change in the amount of urine  unusually weak or tired Side effects that usually do not require medical attention (report to your doctor or health care professional if they continue or are bothersome):  cramps  dark colored stools  diarrhea  headache  nausea, vomiting  stomach upset This list may not describe all possible side effects. Call your doctor for medical advice about side effects. You may report side effects to FDA at 1-800-FDA-1088. Where should I keep my medicine? This drug is given in a hospital or clinic and will not be stored at home. NOTE: This sheet is a summary. It may not cover all   possible information. If you have questions about this medicine, talk to your doctor, pharmacist, or health care provider.  2021 Elsevier/Gold Standard (2008-03-08 15:58:57)   

## 2020-11-30 NOTE — Progress Notes (Signed)
Pt declined to stay for post infusion observation period. Pt stated he has tolerated medication multiple times prior without difficulty. Pt aware to call clinic with any questions or concerns. Pt verbalized understanding and had no further questions.

## 2020-12-03 LAB — CALPROTECTIN, FECAL: Calprotectin, Fecal: 888 ug/g — ABNORMAL HIGH (ref 0–120)

## 2020-12-05 ENCOUNTER — Telehealth: Payer: Self-pay

## 2020-12-05 NOTE — Telephone Encounter (Signed)
See mychart messages- Pt scheduled w/ Percell Miller.

## 2020-12-05 NOTE — Telephone Encounter (Signed)
Justin Rosales is already being had via [employer].

## 2020-12-05 NOTE — Telephone Encounter (Signed)
Patient Name: Justin Rosales Gender: Male DOB: 06/30/1951 Age: 70 Y 61 M 16 D Return Phone Number: [phone] (Primary): [id number] City/ State/ Zip: [place],  [address] Physician Nani Ravens, - MD Relationship To Patient Self Return Phone Number [phone] (Primary) Chief Complaint Cuts and Lacerations Reason for Call Symptomatic / Request for Health Information  Initial Comment: Caller says that he fell on Sunday and scrapped a lot skin on his left arm. The skin is peeled back and is releasing fluid. He sent a pic and spoke with a nurse at the office. He asked if he needs to be refered to a wound specialist but has not heard. He does want to speak with a nurse.  ---Caller says that he fell on Sunday and scrapped a lot skin on his left arm. The skin is peeled back and is releasing fluid (clear). He sent a pic and spoke with a nurse at the office. He asked if he needs to be referred to a wound specialist but has not heard. Applying Neosporin and non adhesive dressing. Area 10" x 2".  No increasing redness or pain. No s/s of infection. No fever. Does the patient have any new or worsening symptoms? ---Yes Will a triage be completed? ---Yes Related visit to physician within the last 2 weeks? ---No Does the PT have any chronic conditions? (i.e. diabetes, asthma, this includes High risk factors for pregnancy, etc.) ---Yes  List chronic conditions. ---UC, Is this a behavioral health or substance abuse call? ---No Guidelines Guideline Title Affirmed Question Affirmed Notes Nurse Date/Time Eilene Ghazi Time) Skin Injury Minor scrape (abrasion) Zenia Resides, RN, Diane 12/05/2020 8:43:58 AM

## 2020-12-05 NOTE — Telephone Encounter (Signed)
Dr. Nani Ravens all booked until Friday 12/07/2020. Pt had already booked apt with Percell Miller on 12/07/20 at 8:20 am. -JMA

## 2020-12-06 ENCOUNTER — Other Ambulatory Visit: Payer: Self-pay | Admitting: Family

## 2020-12-06 ENCOUNTER — Inpatient Hospital Stay: Payer: Medicare Other

## 2020-12-06 DIAGNOSIS — D631 Anemia in chronic kidney disease: Secondary | ICD-10-CM

## 2020-12-06 DIAGNOSIS — D500 Iron deficiency anemia secondary to blood loss (chronic): Secondary | ICD-10-CM

## 2020-12-07 ENCOUNTER — Ambulatory Visit: Payer: Medicare Other | Admitting: Medical

## 2020-12-07 ENCOUNTER — Telehealth: Payer: Self-pay

## 2020-12-07 ENCOUNTER — Ambulatory Visit (HOSPITAL_BASED_OUTPATIENT_CLINIC_OR_DEPARTMENT_OTHER)
Admission: RE | Admit: 2020-12-07 | Discharge: 2020-12-07 | Disposition: A | Discharge status: Home / Self Care | Payer: Medicare Other | Source: Ambulatory Visit | Attending: Medical | Admitting: Medical

## 2020-12-07 ENCOUNTER — Inpatient Hospital Stay: Payer: Medicare Other

## 2020-12-07 ENCOUNTER — Other Ambulatory Visit: Payer: Self-pay

## 2020-12-07 VITALS — BP 155/73 | HR 94 | Resp 18 | Ht 69.0 in | Wt 201.0 lb

## 2020-12-07 VITALS — BP 159/71 | HR 78 | Temp 97.8°F | Resp 17

## 2020-12-07 DIAGNOSIS — N189 Chronic kidney disease, unspecified: Secondary | ICD-10-CM | POA: Diagnosis not present

## 2020-12-07 DIAGNOSIS — D631 Anemia in chronic kidney disease: Secondary | ICD-10-CM

## 2020-12-07 DIAGNOSIS — S5002XA Contusion of left elbow, initial encounter: Secondary | ICD-10-CM | POA: Diagnosis not present

## 2020-12-07 DIAGNOSIS — T148XXA Other injury of unspecified body region, initial encounter: Secondary | ICD-10-CM | POA: Diagnosis not present

## 2020-12-07 DIAGNOSIS — K909 Intestinal malabsorption, unspecified: Secondary | ICD-10-CM

## 2020-12-07 DIAGNOSIS — D500 Iron deficiency anemia secondary to blood loss (chronic): Secondary | ICD-10-CM

## 2020-12-07 DIAGNOSIS — S5012XA Contusion of left forearm, initial encounter: Secondary | ICD-10-CM | POA: Insufficient documentation

## 2020-12-07 LAB — CMP (CANCER CENTER ONLY)
ALT: 14 U/L (ref 0–44)
AST: 17 U/L (ref 15–41)
Albumin: 3.4 g/dL — ABNORMAL LOW (ref 3.5–5.0)
Alkaline Phosphatase: 67 U/L (ref 38–126)
Anion gap: 8 (ref 5–15)
BUN: 13 mg/dL (ref 8–23)
CO2: 27 mmol/L (ref 22–32)
Calcium: 9.1 mg/dL (ref 8.9–10.3)
Chloride: 103 mmol/L (ref 98–111)
Creatinine: 0.73 mg/dL (ref 0.61–1.24)
GFR, Estimated: >60 mL/min (ref >60)
Glucose, Bld: 99 mg/dL (ref 70–99)
Potassium: 4.4 mmol/L (ref 3.5–5.1)
Sodium: 138 mmol/L (ref 135–145)
Total Bilirubin: 0.5 mg/dL (ref 0.3–1.2)
Total Protein: 7 g/dL (ref 6.5–8.1)

## 2020-12-07 LAB — CBC WITH DIFFERENTIAL (CANCER CENTER ONLY)
Abs Immature Granulocytes: 0.01 10*3/uL (ref 0.00–0.07)
Basophils Absolute: 0 10*3/uL (ref 0.0–0.1)
Basophils Relative: 1 %
Eosinophils Absolute: 0.2 10*3/uL (ref 0.0–0.5)
Eosinophils Relative: 3 %
HCT: 35.1 % — ABNORMAL LOW (ref 39.0–52.0)
Hemoglobin: 10.8 g/dL — ABNORMAL LOW (ref 13.0–17.0)
Immature Granulocytes: 0 %
Lymphocytes Relative: 43 %
Lymphs Abs: 2.2 10*3/uL (ref 0.7–4.0)
MCH: 27.8 pg (ref 26.0–34.0)
MCHC: 30.8 g/dL (ref 30.0–36.0)
MCV: 90.2 fL (ref 80.0–100.0)
Monocytes Absolute: 0.6 10*3/uL (ref 0.1–1.0)
Monocytes Relative: 11 %
Neutro Abs: 2.3 10*3/uL (ref 1.7–7.7)
Neutrophils Relative %: 42 %
Platelet Count: 292 10*3/uL (ref 150–400)
RBC: 3.89 MIL/uL — ABNORMAL LOW (ref 4.22–5.81)
RDW: 19.2 % — ABNORMAL HIGH (ref 11.5–15.5)
WBC Count: 5.3 10*3/uL (ref 4.0–10.5)
nRBC: 0 % (ref 0.0–0.2)

## 2020-12-07 LAB — IRON AND TIBC
Iron: 14 ug/dL — ABNORMAL LOW (ref 42–163)
Saturation Ratios: 5 % — ABNORMAL LOW (ref 20–55)
TIBC: 250 ug/dL (ref 202–409)
UIBC: 237 ug/dL (ref 117–376)

## 2020-12-07 LAB — RETICULOCYTES
Immature Retic Fract: 12.3 % (ref 2.3–15.9)
RBC.: 3.94 MIL/uL — ABNORMAL LOW (ref 4.22–5.81)
Retic Count, Absolute: 61.1 10*3/uL (ref 19.0–186.0)
Retic Ct Pct: 1.6 % (ref 0.4–3.1)

## 2020-12-07 LAB — FERRITIN: Ferritin: 319 ng/mL (ref 24–336)

## 2020-12-07 MED ORDER — DOXYCYCLINE HYCLATE 100 MG PO TABS: 100.0000 mg | ORAL_TABLET | Freq: Two times a day (BID) | ORAL | 0 refills | Status: DC

## 2020-12-07 MED ORDER — EPOETIN ALFA-EPBX 40000 UNIT/ML IJ SOLN
40000.0000 [IU] | Freq: Once | INTRAMUSCULAR | Status: AC
  Administered 2020-12-07: 40000 [IU] via SUBCUTANEOUS

## 2020-12-07 MED ORDER — SILVER SULFADIAZINE 1 % EX CREA: 1.0000 "application " | TOPICAL_CREAM | Freq: Every day | CUTANEOUS | 0 refills | Status: DC

## 2020-12-07 MED ORDER — EPOETIN ALFA-EPBX 40000 UNIT/ML IJ SOLN
INTRAMUSCULAR | Status: AC
  Filled 2020-12-07: qty 1

## 2020-12-07 NOTE — Patient Instructions (Addendum)
Recent left forearm abrasion and contusion to forearm as well as elbow.  Surface area of abrasion is quite large.  Swelling presently could be from the fall but infection is a consideration.  Prescribed both Silvadene cream to apply topically once daily and doxycycline antibiotic.  After discussion patient explained that he does not want to take doxycycline presently due to concern for possible C. difficile.  Advised patient he can just use the Silvadene presently but watch for worsening signs and symptoms of infection as discussed in detail.  If infection signs/symptoms were to recur then start doxycycline and notify me.  X-ray of left forearm and elbow showed soft tissue swelling but no fracture.  Cleaned abrasion wound today with saline and Betadine.  Trimmed 3-4 small areas of nonviable dark-colored epidermis.  On review small abrasion of left forehead with normal neurologic exam.  Imaging of head not indicated presently.  Left knee small abrasion elbow scab present.  No complications.  Follow-up in 1 week or as needed.  Advised patient to keep his wound care appointment for early June.  If wound is healing fast/well by next visit if possible he could cancel her appointment.

## 2020-12-07 NOTE — Patient Instructions (Signed)

## 2020-12-07 NOTE — Telephone Encounter (Signed)
S/w pt and he is aware of his iron tx appts per sch message   ann

## 2020-12-07 NOTE — Progress Notes (Signed)
Subjective:    Patient ID: Justin Rosales, male    DOB: 09/23/1950, 70 y.o.   MRN: [id number]  HPI Pt in for left forearm injury. Pt states he fall last Sunday. He tripped on concrete that was lifted up on walking path. Pt fell. He scraped his left knee, left forearm and had abrasion to left side forehead. No loc. He does use eliquis. After fall no ha, no nause, no vomiting, no blurred vision or gross motor/sensory function deficits.  Last tetanus was 2016.  Pt did call wound care center and made appointment for December 26, 2019.      Review of Systems  Constitutional: Negative for chills, fatigue and fever.  Respiratory: Negative for cough, chest tightness, shortness of breath and wheezing.   Cardiovascular: Negative for chest pain and palpitations.  Gastrointestinal: Negative for abdominal pain, blood in stool, constipation, diarrhea and vomiting.  Musculoskeletal: Negative for back pain.  Skin: Negative for rash.       Abrasion. See hpi.  Neurological: Negative for dizziness, tremors, facial asymmetry, speech difficulty, weakness and numbness.  Hematological: Negative for adenopathy. Does not bruise/bleed easily.  Psychiatric/Behavioral: Negative for behavioral problems and decreased concentration.   Past Medical History:  Diagnosis Date  . Anemia   . Arthritis   . C. difficile diarrhea   . DVT (deep venous thrombosis) ([place])    right  . Elevated cholesterol   . 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 Goals of care, counseling/discussion 08/28/2020  . Hypertension   . Iron deficiency anemia due to chronic blood loss 08/28/2020  . 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 Lower leg DVT (deep venous thromboembolism), acute, right ([place]) 09/28/2020  . UC (ulcerative colitis) ([place])      Social History   Socioeconomic History  . Marital status: Married    Spouse name: Not on file  . Number of children: 2  . Years of education: Not on file  . Highest education level: Not on file   Occupational History  . Occupation: Emergency planning/management officer  Tobacco Use  . Smoking status: Former Smoker    Packs/day: 1.00    Years: 30.00    Pack years: 30.00    Types: Cigarettes    Quit date: 2001    Years since quitting: 21.3  . Smokeless tobacco: Never Used  Vaping Use  . Vaping Use: Never used  Substance and Sexual Activity  . Alcohol use: Yes    Comment: ocassionally  . Drug use: Not Currently  . Sexual activity: Not on file  Other Topics Concern  . Not on file  Social History Narrative  . Not on file   Social Determinants of Health   Financial Resource Strain: Not on file  Food Insecurity: Not on file  Transportation Needs: Not on file  Physical Activity: Not on file  Stress: Not on file  Social Connections: Not on file  Intimate Partner Violence: Not on file    Past Surgical History:  Procedure Laterality Date  . COLONOSCOPY     First done at [facility] around age 46. Dr Dorrene German possibly With [facility] x2. last one done around 2012     Family History  Problem Relation Age of Onset  . Hypertension Mother   . Diabetes Mother   . Alzheimer's disease Mother   . Parkinson's disease Father   . Alzheimer's disease Father   . Colon cancer Neg Hx   . Esophageal cancer Neg Hx   . Rectal cancer Neg Hx   .  Stomach cancer Neg Hx     No Known Allergies  Current Outpatient Medications on File Prior to Visit  Medication Sig Dispense Refill  . apixaban (ELIQUIS) 5 MG TABS tablet Take 1 tablet (74m) twice daily 60 tablet 5  . Bepotastine Besilate 1.5 % SOLN 1 drop 2 (two) times daily.    . Cholecalciferol (DIALYVITE VITAMIN D 5000) 125 MCG (5000 UT) capsule Take 5,000 Units by mouth daily.    . diphenhydrAMINE HCl, Sleep, (ZZZQUIL PO) Take 10 mLs by mouth daily as needed (sleep).    . folic acid (FOLVITE) 1 MG tablet Take 2 tablets (2 mg total) by mouth daily. 60 tablet 12  . mercaptopurine (PURINETHOL) 50 MG tablet Take 1 tablet po QD. 90 tablet 2  .  Multiple Vitamin (MULTIVITAMIN WITH MINERALS) TABS tablet Take 1 tablet by mouth daily.    . NON FORMULARY Take 1 tablet by mouth daily. Vital reds-energy supplement    . Saccharomyces boulardii (FLORASTOR PO) Take 1 capsule by mouth in the morning and at bedtime.     No current facility-administered medications on file prior to visit.    BP (!) 155/73   Pulse 94   Resp 18   Ht 5' 9"  (1.753 m)   Wt 201 lb (91.2 kg)   SpO2 99%   BMI 29.68 kg/m       Objective:   Physical Exam   General Mental Status- Alert. General Appearance- Not in acute distress.   Skin General: Color- Normal Color. Moisture- Normal Moisture.  Neck Carotid Arteries- Normal color. Moisture- Normal Moisture. No carotid bruits. No JVD.  Chest and Lung Exam Auscultation: Breath Sounds:-Normal.  Cardiovascular Auscultation:Rythm- Regular. Murmurs & Other Heart Sounds:Auscultation of the heart reveals- No Murmurs.  Abdomen Inspection:-Inspeection Normal. Palpation/Percussion:Note:No mass. Palpation and Percussion of the abdomen reveal- Non Tender, Non Distended + BS, no rebound or guarding.   Neurologic Cranial Nerve exam:- CN III-XII intact(No nystagmus), symmetric smile. Strength:- 5/5 equal and symmetric strength both upper and lower extremities.   Left elbow- moderate swelling.  Left forearm- 6 inch x 3 inch abrasion. Around edges of wound nont viable dark peeled back skin.   Forearm- moderate swollen mid portion to distal 1/3 aspect.  Skin- small suprerficial abrasion to left forehead.  Left knee- small scab present. Not swollen good range of motion.  Left elbow and forarm- no pain on rom.    Assessment & Plan:  Recent left forearm abrasion and contusion to forearm as well as elbow.  Surface area of abrasion is quite large.  Swelling presently could be from the fall but infection is a consideration.  Prescribed both Silvadene cream to apply topically once daily and doxycycline antibiotic.   After discussion patient explained that he does not want to take doxycycline presently due to concern for possible C. difficile.  Advised patient he can just use the Silvadene presently but watch for worsening signs and symptoms of infection as discussed in detail.  If infection signs/symptoms were to recur then start doxycycline and notify me.  X-ray of left forearm and elbow showed soft tissue swelling but no fracture.  Cleaned abrasion wound today with saline and Betadine.  Trimmed 3-4 small areas of nonviable dark-colored epidermis.  On review small abrasion of left forehead with normal neurologic exam.  Imaging of head not indicated presently.  Left knee small abrasion elbow scab present.  No complications.  Follow-up in 1 week or as needed.  Advised patient to keep his wound care  appointment for early June.  If wound is healing fast/well by next visit if possible he could cancel her appointment.

## 2020-12-10 ENCOUNTER — Other Ambulatory Visit: Payer: Medicare Other

## 2020-12-10 DIAGNOSIS — K51919 Ulcerative colitis, unspecified with unspecified complications: Secondary | ICD-10-CM

## 2020-12-10 DIAGNOSIS — D500 Iron deficiency anemia secondary to blood loss (chronic): Secondary | ICD-10-CM

## 2020-12-11 ENCOUNTER — Non-Acute Institutional Stay (HOSPITAL_COMMUNITY)
Admission: RE | Admit: 2020-12-11 | Discharge: 2020-12-11 | Disposition: A | Discharge status: Home / Self Care | Payer: Medicare Other | Source: Ambulatory Visit | Attending: Internal Medicine | Admitting: Internal Medicine

## 2020-12-11 ENCOUNTER — Other Ambulatory Visit: Payer: Self-pay

## 2020-12-11 DIAGNOSIS — K51019 Ulcerative (chronic) pancolitis with unspecified complications: Secondary | ICD-10-CM | POA: Insufficient documentation

## 2020-12-11 LAB — CBC WITH DIFFERENTIAL/PLATELET
Abs Immature Granulocytes: 0.01 10*3/uL (ref 0.00–0.07)
Basophils Absolute: 0 10*3/uL (ref 0.0–0.1)
Basophils Relative: 1 %
Eosinophils Absolute: 0.2 10*3/uL (ref 0.0–0.5)
Eosinophils Relative: 5 %
HCT: 37.1 % — ABNORMAL LOW (ref 39.0–52.0)
Hemoglobin: 11.1 g/dL — ABNORMAL LOW (ref 13.0–17.0)
Immature Granulocytes: 0 %
Lymphocytes Relative: 41 %
Lymphs Abs: 1.9 10*3/uL (ref 0.7–4.0)
MCH: 27.5 pg (ref 26.0–34.0)
MCHC: 29.9 g/dL — ABNORMAL LOW (ref 30.0–36.0)
MCV: 91.8 fL (ref 80.0–100.0)
Monocytes Absolute: 0.6 10*3/uL (ref 0.1–1.0)
Monocytes Relative: 12 %
Neutro Abs: 2 10*3/uL (ref 1.7–7.7)
Neutrophils Relative %: 41 %
Platelets: 302 10*3/uL (ref 150–400)
RBC: 4.04 MIL/uL — ABNORMAL LOW (ref 4.22–5.81)
RDW: 19.3 % — ABNORMAL HIGH (ref 11.5–15.5)
WBC: 4.7 10*3/uL (ref 4.0–10.5)
nRBC: 0 % (ref 0.0–0.2)

## 2020-12-11 LAB — COMPREHENSIVE METABOLIC PANEL
ALT: 13 U/L (ref 0–44)
AST: 18 U/L (ref 15–41)
Albumin: 3.4 g/dL — ABNORMAL LOW (ref 3.5–5.0)
Alkaline Phosphatase: 65 U/L (ref 38–126)
Anion gap: 8 (ref 5–15)
BUN: 11 mg/dL (ref 8–23)
CO2: 27 mmol/L (ref 22–32)
Calcium: 9 mg/dL (ref 8.9–10.3)
Chloride: 104 mmol/L (ref 98–111)
Creatinine, Ser: 0.86 mg/dL (ref 0.61–1.24)
GFR, Estimated: >60 mL/min (ref >60)
Glucose, Bld: 87 mg/dL (ref 70–99)
Potassium: 4 mmol/L (ref 3.5–5.1)
Sodium: 139 mmol/L (ref 135–145)
Total Bilirubin: 0.3 mg/dL (ref 0.3–1.2)
Total Protein: 7 g/dL (ref 6.5–8.1)

## 2020-12-11 LAB — SEDIMENTATION RATE: Sed Rate: 55 mm/hr — ABNORMAL HIGH (ref 0–16)

## 2020-12-11 MED ORDER — SODIUM CHLORIDE 0.9 % IV SOLN
INTRAVENOUS | Status: DC | PRN
  Administered 2020-12-11: 250 mL via INTRAVENOUS

## 2020-12-11 MED ORDER — SODIUM CHLORIDE 0.9 % IV SOLN
10.0000 mg/kg | INTRAVENOUS | Status: DC
  Administered 2020-12-11: 800 mg via INTRAVENOUS
  Filled 2020-12-11: qty 80

## 2020-12-11 NOTE — Progress Notes (Signed)
PATIENT CARE CENTER NOTE  Diagnosis:Ulcerative pancolitis with complication ([place]) (U57.505)   Provider:Mansouraty, Valarie Merino, Md   Procedure:Remicade infusionand lab draw   Note:Patient receivedRemicade infusion (1 of 6) via PIV. Infusion titrated per protocol. Vital signs monitored and remained stable. Patient tolerated well with no adverse reaction. Labs drawn prior to infusion. Patient to get labs drawn prior to each Remicade infusion.  Patient to come every 8 weeks for infusion.Discharge instructions given.Patient alert, oriented and ambulatory at discharge.

## 2020-12-12 LAB — HIGH SENSITIVITY CRP: CRP, High Sensitivity: 16.75 mg/L — ABNORMAL HIGH (ref 0.00–3.00)

## 2020-12-14 ENCOUNTER — Other Ambulatory Visit: Payer: Self-pay

## 2020-12-14 ENCOUNTER — Ambulatory Visit: Payer: Medicare Other | Admitting: Medical

## 2020-12-14 VITALS — BP 136/67 | HR 96 | Resp 18 | Ht 69.0 in | Wt 200.6 lb

## 2020-12-14 DIAGNOSIS — T148XXA Other injury of unspecified body region, initial encounter: Secondary | ICD-10-CM

## 2020-12-14 DIAGNOSIS — S5012XA Contusion of left forearm, initial encounter: Secondary | ICD-10-CM | POA: Diagnosis not present

## 2020-12-14 NOTE — Progress Notes (Signed)
Subjective:    Patient ID: Justin Rosales, male    DOB: June 15, 1951, 70 y.o.   MRN: [id number]  HPI  Pt in for follow up.  Here to check on large area of abrasion. See last note. We cleaned area and debrided the nonviable tissue.  Also gave silvadene cream.     The area has healed very well. No fever, no chills or sweats.   Review of Systems  Constitutional: Negative for chills, fatigue and fever.  Respiratory: Negative for cough, chest tightness, shortness of breath and wheezing.   Cardiovascular: Negative for chest pain and palpitations.  Gastrointestinal: Negative for abdominal pain.  Musculoskeletal: Negative for back pain.  Skin:       See hpi.  Psychiatric/Behavioral: Negative for behavioral problems, decreased concentration and dysphoric mood. The patient is not nervous/anxious and is not hyperactive.     Past Medical History:  Diagnosis Date  . Anemia   . Arthritis   . C. difficile diarrhea   . DVT (deep venous thrombosis) ([place])    right  . Elevated cholesterol   . 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 Goals of care, counseling/discussion 08/28/2020  . Hypertension   . Iron deficiency anemia due to chronic blood loss 08/28/2020  . 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 Lower leg DVT (deep venous thromboembolism), acute, right ([place]) 09/28/2020  . UC (ulcerative colitis) ([place])      Social History   Socioeconomic History  . Marital status: Married    Spouse name: Not on file  . Number of children: 2  . Years of education: Not on file  . Highest education level: Not on file  Occupational History  . Occupation: Emergency planning/management officer  Tobacco Use  . Smoking status: Former Smoker    Packs/day: 1.00    Years: 30.00    Pack years: 30.00    Types: Cigarettes    Quit date: 2001    Years since quitting: 21.4  . Smokeless tobacco: Never Used  Vaping Use  . Vaping Use: Never used  Substance and Sexual Activity  . Alcohol use: Yes    Comment: ocassionally  . Drug use:  Not Currently  . Sexual activity: Not on file  Other Topics Concern  . Not on file  Social History Narrative  . Not on file   Social Determinants of Health   Financial Resource Strain: Not on file  Food Insecurity: Not on file  Transportation Needs: Not on file  Physical Activity: Not on file  Stress: Not on file  Social Connections: Not on file  Intimate Partner Violence: Not on file    Past Surgical History:  Procedure Laterality Date  . COLONOSCOPY     First done at [facility] around age 32. Dr Dorrene German possibly With [facility] x2. last one done around 2012     Family History  Problem Relation Age of Onset  . Hypertension Mother   . Diabetes Mother   . Alzheimer's disease Mother   . Parkinson's disease Father   . Alzheimer's disease Father   . Colon cancer Neg Hx   . Esophageal cancer Neg Hx   . Rectal cancer Neg Hx   . Stomach cancer Neg Hx     No Known Allergies  Current Outpatient Medications on File Prior to Visit  Medication Sig Dispense Refill  . apixaban (ELIQUIS) 5 MG TABS tablet Take 1 tablet (43m) twice daily 60 tablet 5  . Bepotastine Besilate 1.5 % SOLN 1 drop 2 (two) times  daily.    . Cholecalciferol (DIALYVITE VITAMIN D 5000) 125 MCG (5000 UT) capsule Take 5,000 Units by mouth daily.    . diphenhydrAMINE HCl, Sleep, (ZZZQUIL PO) Take 10 mLs by mouth daily as needed (sleep).    . folic acid (FOLVITE) 1 MG tablet Take 2 tablets (2 mg total) by mouth daily. 60 tablet 12  . mercaptopurine (PURINETHOL) 50 MG tablet Take 1 tablet po QD. 90 tablet 2  . Multiple Vitamin (MULTIVITAMIN WITH MINERALS) TABS tablet Take 1 tablet by mouth daily.    . NON FORMULARY Take 1 tablet by mouth daily. Vital reds-energy supplement    . Saccharomyces boulardii (FLORASTOR PO) Take 1 capsule by mouth in the morning and at bedtime.    . silver sulfADIAZINE (SILVADENE) 1 % cream Apply 1 application topically daily. 50 g 0   No current facility-administered  medications on file prior to visit.    BP 136/67   Pulse 96   Resp 18   Ht 5' 9"  (1.753 m)   Wt 200 lb 9.6 oz (91 kg)   SpO2 96%   BMI 29.62 kg/m       Objective:   Physical Exam  General- No acute distress. Pleasant patient. Neck- Full range of motion, no jvd Lungs- Clear, even and unlabored. Heart- regular rate and rhythm. Neurologic- CNII- XII grossly intact.  Left forearm- the wound looks much better than before. Area of abrasion filled I with pink viable tissue/skin. No area of dried, non viable tissue. About 90% improved. Small scabs lateral elbow area.      Assessment & Plan:  Your left forearm abrasion much improved. Skin filled in and today no nonviable tissue. No need for trimming.   Definitely much improved heading in right direction. Continue silvadene once daily. Can leave open to air at night for 3-4 hours.   Follow up  next Friday at 1:20 or sooner if needed. If continued healing then may be able to cancel the woud care appointment you set up.  Mackie Pai, PA-C   Time spent with patient today was 20  minutes which consisted of chart review, discussing diagnosis, work up, treatment and documentation.

## 2020-12-14 NOTE — Patient Instructions (Addendum)
Your left forearm abrasion much improved. Skin filled in and today no nonviable tissue. No need for trimming.   Definitely much improved heading in right direction. Continue silvadene once daily. Can leave open to air at night for 3-4 hours.   Follow up next Friday at 1:20 or sooner if needed. If continued healing then may be able to cancel the woud care appointment you set up.

## 2020-12-16 LAB — SERIAL MONITORING

## 2020-12-18 LAB — INFLIXIMAB+AB (SERIAL MONITOR)
Anti-Infliximab Antibody: <22 ng/mL
Infliximab Drug Level: 1.2 ug/mL

## 2020-12-19 ENCOUNTER — Telehealth: Payer: Self-pay

## 2020-12-19 NOTE — Telephone Encounter (Signed)
-----   Message from Irving Copas., MD sent at 12/19/2020  8:48 AM EDT ----- Chong Sicilian, Can you please let me know what his dosing is, I believe it is 10 mg/kg but just want to ensure that it is? Thanks. GM ----- Message ----- From: Lavone Neri Lab Results In Sent: 12/15/2020   7:52 AM EDT To: Irving Copas., MD

## 2020-12-19 NOTE — Telephone Encounter (Signed)
Yes, 10 mg/ kg is correct.

## 2020-12-20 ENCOUNTER — Inpatient Hospital Stay: Payer: Medicare Other | Attending: Hematology & Oncology

## 2020-12-20 ENCOUNTER — Other Ambulatory Visit: Payer: Self-pay

## 2020-12-20 VITALS — BP 148/69 | HR 67 | Temp 98.1°F | Resp 16

## 2020-12-20 DIAGNOSIS — D500 Iron deficiency anemia secondary to blood loss (chronic): Secondary | ICD-10-CM | POA: Diagnosis not present

## 2020-12-20 DIAGNOSIS — K909 Intestinal malabsorption, unspecified: Secondary | ICD-10-CM

## 2020-12-20 DIAGNOSIS — D631 Anemia in chronic kidney disease: Secondary | ICD-10-CM

## 2020-12-20 DIAGNOSIS — K5190 Ulcerative colitis, unspecified, without complications: Secondary | ICD-10-CM | POA: Insufficient documentation

## 2020-12-20 MED ORDER — NA FERRIC GLUC CPLX IN SUCROSE 12.5 MG/ML IV SOLN
250.0000 mg | Freq: Once | INTRAVENOUS | Status: AC
  Administered 2020-12-20: 250 mg via INTRAVENOUS
  Filled 2020-12-20: qty 20

## 2020-12-20 MED ORDER — SODIUM CHLORIDE 0.9 % IV SOLN
INTRAVENOUS | Status: DC
  Filled 2020-12-20: qty 250

## 2020-12-20 NOTE — Patient Instructions (Signed)
Ferric carboxymaltose injection What is this medicine? FERRIC CARBOXYMALTOSE (ferr-ik car-box-ee-mol-toes) is an iron complex. Iron is used to make healthy red blood cells, which carry oxygen and nutrients throughout the body. This medicine is used to treat anemia in people with chronic kidney disease or people who cannot take iron by mouth. This medicine may be used for other purposes; ask your health care provider or pharmacist if you have questions. COMMON BRAND NAME(S): Injectafer What should I tell my health care provider before I take this medicine? They need to know if you have any of these conditions:  high levels of iron in the blood  liver disease  an unusual or allergic reaction to iron, other medicines, foods, dyes, or preservatives  pregnant or trying to get pregnant  breast-feeding How should I use this medicine? This medicine is for infusion into a vein. It is given by a health care professional in a hospital or clinic setting. Talk to your pediatrician regarding the use of this medicine in children. Special care may be needed. Overdosage: If you think you have taken too much of this medicine contact a poison control center or emergency room at once. NOTE: This medicine is only for you. Do not share this medicine with others. What if I miss a dose? Keep appointments for follow-up doses. It is important not to miss your dose. Call your care team if you are unable to keep an appointment. What may interact with this medicine? Do not take this medicine with any of the following medications:  deferoxamine  dimercaprol  other iron products This list may not describe all possible interactions. Give your health care provider a list of all the medicines, herbs, non-prescription drugs, or dietary supplements you use. Also tell them if you smoke, drink alcohol, or use illegal drugs. Some items may interact with your medicine. What should I watch for while using this  medicine? Visit your doctor or health care professional regularly. Tell your doctor if your symptoms do not start to get better or if they get worse. You may need blood work done while you are taking this medicine. You may need to follow a special diet. Talk to your doctor. Foods that contain iron include: whole grains/cereals, dried fruits, beans, or peas, leafy green vegetables, and organ meats (liver, kidney). What side effects may I notice from receiving this medicine? Side effects that you should report to your doctor or health care professional as soon as possible:  allergic reactions like skin rash, itching or hives, swelling of the face, lips, or tongue  dizziness  facial flushing Side effects that usually do not require medical attention (report to your doctor or health care professional if they continue or are bothersome):  changes in taste  constipation  headache  nausea, vomiting  pain, redness, or irritation at site where injected This list may not describe all possible side effects. Call your doctor for medical advice about side effects. You may report side effects to FDA at 1-800-FDA-1088. Where should I keep my medicine? This drug is given in a hospital or clinic and will not be stored at home. NOTE: This sheet is a summary. It may not cover all possible information. If you have questions about this medicine, talk to your doctor, pharmacist, or health care provider.  2021 Elsevier/Gold Standard (2020-06-19 14:00:47)

## 2020-12-21 ENCOUNTER — Ambulatory Visit: Payer: Medicare Other | Admitting: Medical

## 2020-12-21 ENCOUNTER — Other Ambulatory Visit: Payer: Medicare Other

## 2020-12-21 ENCOUNTER — Ambulatory Visit: Payer: Medicare Other

## 2020-12-21 ENCOUNTER — Ambulatory Visit: Payer: Medicare Other | Admitting: Hematology & Oncology

## 2020-12-24 ENCOUNTER — Inpatient Hospital Stay: Payer: Medicare Other

## 2020-12-24 ENCOUNTER — Inpatient Hospital Stay (HOSPITAL_BASED_OUTPATIENT_CLINIC_OR_DEPARTMENT_OTHER): Payer: Medicare Other | Admitting: Hematology & Oncology

## 2020-12-24 ENCOUNTER — Other Ambulatory Visit: Payer: Self-pay

## 2020-12-24 ENCOUNTER — Telehealth: Payer: Self-pay | Admitting: *Deleted

## 2020-12-24 VITALS — BP 154/95 | HR 88 | Temp 98.4°F | Resp 20 | Wt 203.0 lb

## 2020-12-24 DIAGNOSIS — D500 Iron deficiency anemia secondary to blood loss (chronic): Secondary | ICD-10-CM | POA: Diagnosis not present

## 2020-12-24 DIAGNOSIS — D631 Anemia in chronic kidney disease: Secondary | ICD-10-CM

## 2020-12-24 LAB — CBC WITH DIFFERENTIAL (CANCER CENTER ONLY)
Abs Immature Granulocytes: 0.02 10*3/uL (ref 0.00–0.07)
Basophils Absolute: 0 10*3/uL (ref 0.0–0.1)
Basophils Relative: 1 %
Eosinophils Absolute: 0.2 10*3/uL (ref 0.0–0.5)
Eosinophils Relative: 4 %
HCT: 34.7 % — ABNORMAL LOW (ref 39.0–52.0)
Hemoglobin: 11 g/dL — ABNORMAL LOW (ref 13.0–17.0)
Immature Granulocytes: 0 %
Lymphocytes Relative: 45 %
Lymphs Abs: 2.6 10*3/uL (ref 0.7–4.0)
MCH: 28.8 pg (ref 26.0–34.0)
MCHC: 31.7 g/dL (ref 30.0–36.0)
MCV: 90.8 fL (ref 80.0–100.0)
Monocytes Absolute: 0.4 10*3/uL (ref 0.1–1.0)
Monocytes Relative: 8 %
Neutro Abs: 2.3 10*3/uL (ref 1.7–7.7)
Neutrophils Relative %: 42 %
Platelet Count: 240 10*3/uL (ref 150–400)
RBC: 3.82 MIL/uL — ABNORMAL LOW (ref 4.22–5.81)
RDW: 18.6 % — ABNORMAL HIGH (ref 11.5–15.5)
WBC Count: 5.5 10*3/uL (ref 4.0–10.5)
nRBC: 0 % (ref 0.0–0.2)

## 2020-12-24 LAB — CMP (CANCER CENTER ONLY)
ALT: 10 U/L (ref 0–44)
AST: 14 U/L — ABNORMAL LOW (ref 15–41)
Albumin: 3.6 g/dL (ref 3.5–5.0)
Alkaline Phosphatase: 68 U/L (ref 38–126)
Anion gap: 7 (ref 5–15)
BUN: 16 mg/dL (ref 8–23)
CO2: 28 mmol/L (ref 22–32)
Calcium: 9.4 mg/dL (ref 8.9–10.3)
Chloride: 104 mmol/L (ref 98–111)
Creatinine: 0.91 mg/dL (ref 0.61–1.24)
GFR, Estimated: >60 mL/min (ref >60)
Glucose, Bld: 121 mg/dL — ABNORMAL HIGH (ref 70–99)
Potassium: 4.1 mmol/L (ref 3.5–5.1)
Sodium: 139 mmol/L (ref 135–145)
Total Bilirubin: 0.3 mg/dL (ref 0.3–1.2)
Total Protein: 6.3 g/dL — ABNORMAL LOW (ref 6.5–8.1)

## 2020-12-24 LAB — RETICULOCYTES
Immature Retic Fract: 15.6 % (ref 2.3–15.9)
RBC.: 3.87 MIL/uL — ABNORMAL LOW (ref 4.22–5.81)
Retic Count, Absolute: 67.3 10*3/uL (ref 19.0–186.0)
Retic Ct Pct: 1.7 % (ref 0.4–3.1)

## 2020-12-24 LAB — IRON AND TIBC
Iron: 44 ug/dL (ref 42–163)
Saturation Ratios: 18 % — ABNORMAL LOW (ref 20–55)
TIBC: 249 ug/dL (ref 202–409)
UIBC: 205 ug/dL (ref 117–376)

## 2020-12-24 LAB — FERRITIN: Ferritin: 322 ng/mL (ref 24–336)

## 2020-12-24 NOTE — Telephone Encounter (Signed)
Pt notified per order of Dr. Marin Olp "that the iron is low but better.  We still need to give you some iron this week before you go on vacation. Thank you.  Pete"  Pt appreciative of call and has no questions at this time.

## 2020-12-24 NOTE — Progress Notes (Signed)
Hematology and Oncology Follow Up Visit  Justin Rosales [id number] 08/15/1950 70 y.o. 12/24/2020   Principle Diagnosis:   Iron deficiency anemia secondary to GI blood loss of malabsorption secondary to ulcerative colitis  Thromboembolism in the right popliteal vein  Erythropoietin deficient anemia  Current Therapy:    IV iron as needed -dose given in 10/2020  Eliquis 5 mg p.o. twice daily-to complete 6 months of therapy in June 2022  Retacrit 40,000 units subcu weekly for Hgb <11     Interim History:  Mr. Yurkovich is back for follow-up.  So far, everything is going pretty well for him.  He is getting ready to go to the [employer] this weekend for a trip.  It sounds like the whole family is going.  He is doing well with treatment.  He is responding as we expected.  He did get iron back in May.  At that time, his iron saturation was only 5%.  We will have to see what his iron levels are.  He has had no obvious bleeding.  We will stop his Eliquis this month.  His last Doppler back in March did not show any thrombus in the right leg.  He will have 6 months of therapy.  I think this would be enough for him.  He will be happy to be off it.  He and his wife for walking I think over Memorial Day weekend and he fell and scraped his left forearm.  There is a large healed eschar on the left forearm.  He has had no fever.  There is no cough or shortness of breath.  There is no leg swelling.  Overall, his performance status is ECOG 0.    Medications:  Current Outpatient Medications:  .  apixaban (ELIQUIS) 5 MG TABS tablet, Take 1 tablet (27m) twice daily, Disp: 60 tablet, Rfl: 5 .  Bepotastine Besilate 1.5 % SOLN, 1 drop 2 (two) times daily., Disp: , Rfl:  .  Cholecalciferol (DIALYVITE VITAMIN D 5000) 125 MCG (5000 UT) capsule, Take 5,000 Units by mouth daily., Disp: , Rfl:  .  diphenhydrAMINE HCl, Sleep, (ZZZQUIL PO), Take 10 mLs by mouth daily as needed (sleep)., Disp: , Rfl:  .  folic acid  (FOLVITE) 1 MG tablet, Take 2 tablets (2 mg total) by mouth daily., Disp: 60 tablet, Rfl: 12 .  mercaptopurine (PURINETHOL) 50 MG tablet, Take 1 tablet po QD., Disp: 90 tablet, Rfl: 2 .  Multiple Vitamin (MULTIVITAMIN WITH MINERALS) TABS tablet, Take 1 tablet by mouth daily., Disp: , Rfl:  .  NON FORMULARY, Take 1 tablet by mouth daily. Vital reds-energy supplement, Disp: , Rfl:  .  Saccharomyces boulardii (FLORASTOR PO), Take 1 capsule by mouth in the morning and at bedtime., Disp: , Rfl:  .  silver sulfADIAZINE (SILVADENE) 1 % cream, Apply 1 application topically daily., Disp: 50 g, Rfl: 0  Allergies: No Known Allergies  Past Medical History, Surgical history, Social history, and Family History were reviewed and updated.  Review of Systems: Review of Systems  Constitutional: Positive for fatigue.  HENT:  Negative.   Eyes: Negative.   Respiratory: Positive for shortness of breath.   Cardiovascular: Negative.   Gastrointestinal: Positive for blood in stool and diarrhea.  Endocrine: Negative.   Genitourinary: Negative.    Musculoskeletal: Negative.   Skin: Negative.   Neurological: Negative.   Hematological: Negative.   Psychiatric/Behavioral: Negative.     Physical Exam:  weight is 92.1 kg. His oral temperature is 98.4  F (36.9 C). His blood pressure is 154/95 (abnormal) and his pulse is 88. His respiration is 20 and oxygen saturation is 98%.   Wt Readings from Last 3 Encounters:  12/24/20 92.1 kg  12/14/20 91 kg  12/07/20 91.2 kg    Physical Exam Vitals reviewed.  HENT:     Head: Normocephalic and atraumatic.  Eyes:     Pupils: Pupils are equal, round, and reactive to light.  Cardiovascular:     Rate and Rhythm: Normal rate and regular rhythm.     Heart sounds: Normal heart sounds.  Pulmonary:     Effort: Pulmonary effort is normal.     Breath sounds: Normal breath sounds.  Abdominal:     General: Bowel sounds are normal.     Palpations: Abdomen is soft.   Musculoskeletal:        General: No tenderness or deformity. Normal range of motion.     Cervical back: Normal range of motion.  Lymphadenopathy:     Cervical: No cervical adenopathy.  Skin:    General: Skin is warm and dry.     Findings: No erythema or rash.  Neurological:     Mental Status: He is alert and oriented to person, place, and time.  Psychiatric:        Behavior: Behavior normal.        Thought Content: Thought content normal.        Judgment: Judgment normal.    Lab Results  Component Value Date   WBC 5.5 12/24/2020   HGB 11.0 (L) 12/24/2020   HCT 34.7 (L) 12/24/2020   MCV 90.8 12/24/2020   PLT 240 12/24/2020     Chemistry      Component Value Date/Time   NA 139 12/24/2020 0740   K 4.1 12/24/2020 0740   CL 104 12/24/2020 0740   CO2 28 12/24/2020 0740   BUN 16 12/24/2020 0740   CREATININE 0.91 12/24/2020 0740      Component Value Date/Time   CALCIUM 9.4 12/24/2020 0740   ALKPHOS 68 12/24/2020 0740   AST 14 (L) 12/24/2020 0740   ALT 10 12/24/2020 0740   BILITOT 0.3 12/24/2020 0740       Impression and Plan: Mr. Snowdon is a very nice 70 year old white male.  He has ulcerative colitis.  He had Clostridium diarrhea.  He became profoundly iron deficient.  We are given his iron back.  He is on Retacrit.  I hate that we have to use Retacrit not Aranesp.  He has to come back more often because of the Retacrit.  We will see what his iron studies look like.  We will hold off on the Retacrit today because of his hemoglobin.  We will plan to have him come back in a month.  Hopefully, he will not need to come back any sooner for Retacrit.  Hopefully, having him off Eliquis will help with his anemia.    [name], MD 6/6/20228:22 AM

## 2020-12-24 NOTE — Telephone Encounter (Signed)
-----   Message from Justin Napoleon, MD sent at 12/24/2020  3:48 PM EDT ----- Please call and tell him that the iron is low but better.  We still need to give him some iron.  Make sure he comes in this week for iron before he goes on vacation.  Thank you.  [name]

## 2020-12-25 ENCOUNTER — Telehealth: Payer: Self-pay

## 2020-12-25 NOTE — Telephone Encounter (Signed)
12/24/20 los appts made and pt will gain sch at 12/27/20 appt and through [employer]

## 2020-12-27 ENCOUNTER — Inpatient Hospital Stay: Payer: Medicare Other

## 2020-12-27 ENCOUNTER — Other Ambulatory Visit: Payer: Self-pay

## 2020-12-27 VITALS — BP 143/63 | HR 70 | Temp 97.9°F | Resp 18

## 2020-12-27 DIAGNOSIS — D500 Iron deficiency anemia secondary to blood loss (chronic): Secondary | ICD-10-CM | POA: Diagnosis not present

## 2020-12-27 DIAGNOSIS — D631 Anemia in chronic kidney disease: Secondary | ICD-10-CM

## 2020-12-27 DIAGNOSIS — K909 Intestinal malabsorption, unspecified: Secondary | ICD-10-CM

## 2020-12-27 MED ORDER — SODIUM CHLORIDE 0.9 % IV SOLN
INTRAVENOUS | Status: DC
  Filled 2020-12-27: qty 250

## 2020-12-27 MED ORDER — LORATADINE 10 MG PO TABS
10.0000 mg | ORAL_TABLET | Freq: Once | ORAL | Status: DC
  Filled 2020-12-27: qty 1

## 2020-12-27 MED ORDER — SODIUM CHLORIDE 0.9 % IV SOLN
250.0000 mg | Freq: Once | INTRAVENOUS | Status: AC
  Administered 2020-12-27: 250 mg via INTRAVENOUS
  Filled 2020-12-27: qty 20

## 2020-12-27 MED ORDER — ACETAMINOPHEN 325 MG PO TABS: 650.0000 mg | ORAL_TABLET | Freq: Once | ORAL | Status: DC

## 2020-12-27 NOTE — Progress Notes (Signed)
Pt. Refused to wait 30 min. Post infusion, released stable and ASX.

## 2020-12-27 NOTE — Patient Instructions (Signed)
Ferric carboxymaltose injection What is this medicine? FERRIC CARBOXYMALTOSE (ferr-ik car-box-ee-mol-toes) is an iron complex. Iron is used to make healthy red blood cells, which carry oxygen and nutrients throughout the body. This medicine is used to treat anemia in people with chronic kidney disease or people who cannot take iron by mouth. This medicine may be used for other purposes; ask your health care provider or pharmacist if you have questions. COMMON BRAND NAME(S): Injectafer What should I tell my health care provider before I take this medicine? They need to know if you have any of these conditions: high levels of iron in the blood liver disease an unusual or allergic reaction to iron, other medicines, foods, dyes, or preservatives pregnant or trying to get pregnant breast-feeding How should I use this medicine? This medicine is for infusion into a vein. It is given by a health care professional in a hospital or clinic setting. Talk to your pediatrician regarding the use of this medicine in children. Special care may be needed. Overdosage: If you think you have taken too much of this medicine contact a poison control center or emergency room at once. NOTE: This medicine is only for you. Do not share this medicine with others. What if I miss a dose? Keep appointments for follow-up doses. It is important not to miss your dose. Call your care team if you are unable to keep an appointment. What may interact with this medicine? Do not take this medicine with any of the following medications: deferoxamine dimercaprol other iron products This list may not describe all possible interactions. Give your health care provider a list of all the medicines, herbs, non-prescription drugs, or dietary supplements you use. Also tell them if you smoke, drink alcohol, or use illegal drugs. Some items may interact with your medicine. What should I watch for while using this medicine? Visit your doctor  or health care professional regularly. Tell your doctor if your symptoms do not start to get better or if they get worse. You may need blood work done while you are taking this medicine. You may need to follow a special diet. Talk to your doctor. Foods that contain iron include: whole grains/cereals, dried fruits, beans, or peas, leafy green vegetables, and organ meats (liver, kidney). What side effects may I notice from receiving this medicine? Side effects that you should report to your doctor or health care professional as soon as possible: allergic reactions like skin rash, itching or hives, swelling of the face, lips, or tongue dizziness facial flushing Side effects that usually do not require medical attention (report to your doctor or health care professional if they continue or are bothersome): changes in taste constipation headache nausea, vomiting pain, redness, or irritation at site where injected This list may not describe all possible side effects. Call your doctor for medical advice about side effects. You may report side effects to FDA at 1-800-FDA-1088. Where should I keep my medicine? This drug is given in a hospital or clinic and will not be stored at home. NOTE: This sheet is a summary. It may not cover all possible information. If you have questions about this medicine, talk to your doctor, pharmacist, or health care provider.  2021 Elsevier/Gold Standard (2020-06-19 14:00:47)

## 2021-01-10 ENCOUNTER — Ambulatory Visit (INDEPENDENT_AMBULATORY_CARE_PROVIDER_SITE_OTHER): Payer: Medicare Other | Admitting: Gastroenterology

## 2021-01-10 ENCOUNTER — Encounter: Payer: Self-pay | Admitting: Gastroenterology

## 2021-01-10 ENCOUNTER — Other Ambulatory Visit: Payer: Self-pay | Admitting: *Deleted

## 2021-01-10 VITALS — BP 160/80 | HR 91 | Ht 71.0 in | Wt 204.0 lb

## 2021-01-10 DIAGNOSIS — K5190 Ulcerative colitis, unspecified, without complications: Secondary | ICD-10-CM

## 2021-01-10 DIAGNOSIS — R5383 Other fatigue: Secondary | ICD-10-CM | POA: Diagnosis not present

## 2021-01-10 DIAGNOSIS — Z9225 Personal history of immunosupression therapy: Secondary | ICD-10-CM | POA: Diagnosis not present

## 2021-01-10 DIAGNOSIS — D500 Iron deficiency anemia secondary to blood loss (chronic): Secondary | ICD-10-CM

## 2021-01-10 MED ORDER — MERCAPTOPURINE 50 MG PO TABS: ORAL_TABLET | ORAL | 1 refills | Status: DC

## 2021-01-10 MED ORDER — FOLIC ACID 1 MG PO TABS: 2.0000 mg | ORAL_TABLET | Freq: Every day | ORAL | 12 refills | Status: DC

## 2021-01-10 NOTE — Patient Instructions (Addendum)
Remicade will now be done every 4 weeks. Labs will continue to be drawn at each infusion.   You will be contacted by nurse with updated for your infusion.   Recall has been placed for Colonoscopy to be done Nov/Dec 2022.   Please keep follow-up appointment on: 03/14/21 @ 10:30am   Increase your Mercaptopurine to 78m - once daily.   If you are age 465or younger, your body mass index should be between 19-25. Your Body mass index is 28.45 kg/m. If this is out of the aformentioned range listed, please consider follow up with your Primary Care Provider.   __________________________________________________________  The [facility] providers would like to encourage you to use [facility] communicate with providers for non-urgent requests or questions.  Due to long hold times on the telephone, sending your provider a message by [facility] be a faster and more efficient way to get a response.  Please allow 48 business hours for a response.  Please remember that this is for non-urgent requests.   Thank you for choosing [facility] and [facility].  Dr. MRush Landmark

## 2021-01-11 ENCOUNTER — Telehealth: Payer: Self-pay

## 2021-01-11 ENCOUNTER — Other Ambulatory Visit: Payer: Self-pay

## 2021-01-11 NOTE — Telephone Encounter (Signed)
Per Dr Rush Landmark, pt needs to have Remicade every 4 weeks vs every 8 weeks due to low levels of remicade per lab results.

## 2021-01-12 ENCOUNTER — Encounter: Payer: Self-pay | Admitting: Gastroenterology

## 2021-01-12 NOTE — Progress Notes (Signed)
[facility] VISIT   Primary Care Provider Shelda Pal, [address] [place] [address] [phone]  Patient Profile: Justin Rosales is a 70 y.o. male with a pmh significant for chronic left-sided ulcerative colitis (now on 6MP + Remicade 15m/kg Q8W), prior C. difficile infection (requiring p.o. vancomycin and fidaxomycin), history of Candida esophagitis, chronic anemia with iron deficiency (s/p IV Iron infusions), hypertension, hyperlipidemia, prior VTE (now off AC).  [facility] for an evaluation and management of problem(s) noted below:  Problem List 1. Chronic ulcerative colitis without complication, unspecified location ([place]   2. History of immunosuppression therapy   3. Fatigue, unspecified type   4. Iron deficiency anemia due to chronic blood loss      History of Present Illness Please see prior notes from the GI team and myself for full details of HPI.  Interval History Today, the patient returns for scheduled follow-up with his wife.  Patient overall has been doing and feeling better.  He feels the last 4 to 5 weeks he has had more energy than he has previously.  Overall stools have not normalized as of yet but he continues to have between 1 and 3 softer sometimes loose bowel movements per day.  No blood in his stools.  No nocturnal bowel movements.  Urgency continues to improve.  He has not had any fecal incontinence.  The patient has stopped his anticoagulation at this point.  We obtained therapeutic drug monitoring which showed that the patient did not have any antibody levels but had low trough drug levels.  He is here today to discuss this further.  BMs per day -1-3 Nocturnal BMs -none Blood -none Mucous -none Tenesmus -none Urgency -improving Skin Manifestations -none Eye Manifestations -none Joint Manifestations -none  GI Review of Systems Positive as  above Negative for dysphagia, odynophagia, pain, melena, hematochezia  Review of Systems General: Denies fevers/chills/weight loss unintentionally HEENT: Denies oral lesions or oral thrush Cardiovascular: Denies chest pain/palpitations Pulmonary: Denies shortness of breath Gastroenterological: See HPI Genitourinary: Denies darkened urine Hematological: Improving bruising/bleeding since anticoagulation no longer being taken Dermatological: Denies jaundice Psychological: Mood is stable   Medications Current Outpatient Medications  Medication Sig Dispense Refill   Bepotastine Besilate 1.5 % SOLN 1 drop 2 (two) times daily.     Cholecalciferol (DIALYVITE VITAMIN D 5000) 125 MCG (5000 UT) capsule Take 5,000 Units by mouth daily.     diphenhydrAMINE HCl, Sleep, (ZZZQUIL PO) Take 10 mLs by mouth daily as needed (sleep).     inFLIXimab in sodium chloride 0.9 % Inject into the vein every 8 (eight) weeks.     Multiple Vitamin (MULTIVITAMIN WITH MINERALS) TABS tablet Take 1 tablet by mouth daily.     NON FORMULARY Take 1 tablet by mouth daily. Vital reds-energy supplement     Saccharomyces boulardii (FLORASTOR PO) Take 1 capsule by mouth in the morning and at bedtime.     silver sulfADIAZINE (SILVADENE) 1 % cream Apply 1 application topically daily. 50 g 0   folic acid (FOLVITE) 1 MG tablet Take 2 tablets (2 mg total) by mouth daily. 60 tablet 12   mercaptopurine (PURINETHOL) 50 MG tablet Take 1.5 tablet po QD. 90 tablet 1   No current facility-administered medications for this visit.    Allergies No Known Allergies  Histories Past Medical History:  Diagnosis Date   Anemia    Arthritis    C. 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 Dr Dorrene German possibly With [facility] x2. last one done around 2012    Social History   Socioeconomic History   Marital status: Married    Spouse name: Judeen Hammans   Number of children: 2   Years of education: Not on file   Highest education level: Not on file  Occupational History   Occupation: Emergency planning/management officer   Occupation: retired  Tobacco Use   Smoking status: Former    Packs/day: 1.00    Years: 30.00    Pack years: 30.00    Types: Cigarettes    Quit date: 2001    Years since quitting: 21.4   Smokeless tobacco: Never  Vaping Use   Vaping Use: Never used  Substance and Sexual Activity   Alcohol use: Yes    Comment: ocassionally   Drug use: Not Currently   Sexual activity: Not on file  Other Topics Concern   Not on file  Social History Narrative   Not on file   Social Determinants of Health   Financial Resource Strain: Not on file  Food Insecurity: Not on file  Transportation Needs: Not on file  Physical Activity: Not on file  Stress: Not on file  Social Connections: Not on file  Intimate Partner Violence: Not on file   Family History  Problem Relation Age of Onset   Hypertension Mother    Diabetes Mother    Alzheimer's disease Mother    Parkinson's disease Father    Alzheimer's disease Father    Colon cancer Neg Hx    Esophageal cancer Neg Hx    Rectal cancer Neg Hx    Stomach cancer Neg Hx    I have reviewed his medical, social, and family history in detail and updated the electronic medical record as necessary.    PHYSICAL EXAMINATION  BP (!) 160/80   Pulse 91   Ht 5' 11"  (1.803 m)   Wt 204 lb (92.5 kg)   BMI 28.45 kg/m  Wt Readings from Last 3 Encounters:  01/10/21 204 lb (92.5 kg)  12/24/20 203 lb (92.1 kg)  12/14/20  200 lb 9.6 oz (91 kg)  GEN: NAD, appears stated age, accompanied by wife PSYCH: Cooperative, without pressured speech EYE: Conjunctivae pink, sclerae anicteric ENT: Moist mucous membranes CV: Nontachycardic RESP: No audible wheezing GI: NABS, soft, rounded, NT/ND, without rebound MSK/EXT: No lower extremity edema SKIN: No jaundice NEURO:  Alert & Oriented x 3, no focal deficits   REVIEW OF DATA  I reviewed the following data at the time of this encounter:  GI Procedures and Studies  No new studies to review  Laboratory Studies  Reviewed those in epic Infliximab drug level 1.2 mcg/mL Infliximab antibody level less than 22  Imaging Studies  No new imaging to review   ASSESSMENT  Mr. Dileonardo is a 71 y.o. male with a pmh significant for chronic left-sided ulcerative colitis (now on 6MP + Remicade 60m/kg Q8W), prior C. difficile infection (requiring p.o. vancomycin  and fidaxomycin), history of Candida esophagitis, chronic anemia with iron deficiency (s/p IV Iron infusions), hypertension, hyperlipidemia, prior VTE (now off AC).The patient is seen today for evaluation and management of:  1. Chronic ulcerative colitis without complication, unspecified location ([place])   2. History of immunosuppression therapy   3. Fatigue, unspecified type   4. Iron deficiency anemia due to chronic blood loss    The patient is clinically and hemodynamically stable at this time.  For the first time in quite a while he has been doing and feeling well.  We found that his therapeutic drug monitoring showed no evidence of antibody development to Remicade but his trough level was quite low.  We discussed the possibility of moving forward with a colonoscopy to show evidence of persistent inflammation that may then pushes to increase the frequency of his Remicade versus moving forward with increasing his Remicade frequency to increase drug levels and subsequently in a few months recheck his colon to see if things  have improved.  If the patient were to choose the second option and still have active disease then an alternative change in therapeutics would have to be considered because we would have a class failure.  We are going to increase his 6-MP to 75 mg daily and maintain it at that level.  I am hopeful that by increasing his frequency we will have the patient feel even better.  He and his wife understand that we are increasing the amount of Remicade that he will see in his lifetime, so the increased risk for complications from immunosuppression or from infections or lymphoma/cancer risk will increase slightly and they accept this risk.  We will move to a every 4 week infusion frequency and see how he does after 3 months of receiving every 4 week dosing.  We will follow-up his CRP and ESR as well as other electrolytes/liver tests and blood counts with each of his infusions.  All patient questions were answered to the best of my ability, and the patient agrees to the aforementioned plan of action with follow-up as indicated.   PLAN  Transition Remicade 10 mg/kg to every 4 weeks After three 4-week infusions, will obtain drug trough and antibody assessment Increase 6-MP to 75 mg daily Continue protein supplementation Appreciate hematology follow-up and further IV iron infusions as necessary  We will plan repeat endoscopic evaluation in November/December after patient has had at least 3 months of Remicade every 4 week dosing ESR/CRP/CMP/CBC to be obtained with each infusion   No orders of the defined types were placed in this encounter.   New Prescriptions   No medications on file   Modified Medications   Modified Medication Previous Medication   FOLIC ACID (FOLVITE) 1 MG TABLET folic acid (FOLVITE) 1 MG tablet      Take 2 tablets (2 mg total) by mouth daily.    Take 2 tablets (2 mg total) by mouth daily.   MERCAPTOPURINE (PURINETHOL) 50 MG TABLET mercaptopurine (PURINETHOL) 50 MG tablet      Take 1.5  tablet po QD.    Take 1 tablet po QD.    Planned Follow Up No follow-ups on file.   Total Time in Face-to-Face and in Coordination of Care for patient including independent/personal interpretation/review of prior testing, medical history, examination, medication adjustment, communicating results with the patient directly, and documentation with the EHR is 25 minutes.   Justice Britain, MD [facility]oscopy Office # [phone]

## 2021-01-14 ENCOUNTER — Other Ambulatory Visit: Payer: Self-pay

## 2021-01-14 DIAGNOSIS — K5190 Ulcerative colitis, unspecified, without complications: Secondary | ICD-10-CM

## 2021-01-14 NOTE — Telephone Encounter (Signed)
New remicade orders have been entered.  Pt aware he already has a date set at the infusion center

## 2021-01-15 ENCOUNTER — Other Ambulatory Visit: Payer: Self-pay | Admitting: *Deleted

## 2021-01-15 DIAGNOSIS — D631 Anemia in chronic kidney disease: Secondary | ICD-10-CM

## 2021-01-15 DIAGNOSIS — K909 Intestinal malabsorption, unspecified: Secondary | ICD-10-CM

## 2021-01-16 ENCOUNTER — Inpatient Hospital Stay: Payer: Medicare Other | Admitting: Hematology & Oncology

## 2021-01-16 ENCOUNTER — Encounter: Payer: Self-pay | Admitting: Hematology & Oncology

## 2021-01-16 ENCOUNTER — Inpatient Hospital Stay: Payer: Medicare Other

## 2021-01-16 ENCOUNTER — Other Ambulatory Visit: Payer: Self-pay

## 2021-01-16 ENCOUNTER — Telehealth: Payer: Self-pay

## 2021-01-16 VITALS — BP 164/85 | HR 79 | Temp 98.1°F | Resp 16 | Wt 205.8 lb

## 2021-01-16 DIAGNOSIS — K909 Intestinal malabsorption, unspecified: Secondary | ICD-10-CM

## 2021-01-16 DIAGNOSIS — D500 Iron deficiency anemia secondary to blood loss (chronic): Secondary | ICD-10-CM

## 2021-01-16 DIAGNOSIS — D631 Anemia in chronic kidney disease: Secondary | ICD-10-CM

## 2021-01-16 LAB — CBC WITH DIFFERENTIAL (CANCER CENTER ONLY)
Abs Immature Granulocytes: 0.02 10*3/uL (ref 0.00–0.07)
Basophils Absolute: 0 10*3/uL (ref 0.0–0.1)
Basophils Relative: 0 %
Eosinophils Absolute: 0.1 10*3/uL (ref 0.0–0.5)
Eosinophils Relative: 1 %
HCT: 37.2 % — ABNORMAL LOW (ref 39.0–52.0)
Hemoglobin: 11.7 g/dL — ABNORMAL LOW (ref 13.0–17.0)
Immature Granulocytes: 0 %
Lymphocytes Relative: 49 %
Lymphs Abs: 3 10*3/uL (ref 0.7–4.0)
MCH: 29.9 pg (ref 26.0–34.0)
MCHC: 31.5 g/dL (ref 30.0–36.0)
MCV: 95.1 fL (ref 80.0–100.0)
Monocytes Absolute: 0.5 10*3/uL (ref 0.1–1.0)
Monocytes Relative: 8 %
Neutro Abs: 2.6 10*3/uL (ref 1.7–7.7)
Neutrophils Relative %: 42 %
Platelet Count: 268 10*3/uL (ref 150–400)
RBC: 3.91 MIL/uL — ABNORMAL LOW (ref 4.22–5.81)
RDW: 17.9 % — ABNORMAL HIGH (ref 11.5–15.5)
WBC Count: 6.2 10*3/uL (ref 4.0–10.5)
nRBC: 0 % (ref 0.0–0.2)

## 2021-01-16 LAB — CMP (CANCER CENTER ONLY)
ALT: 17 U/L (ref 0–44)
AST: 20 U/L (ref 15–41)
Albumin: 3.9 g/dL (ref 3.5–5.0)
Alkaline Phosphatase: 71 U/L (ref 38–126)
Anion gap: 6 (ref 5–15)
BUN: 13 mg/dL (ref 8–23)
CO2: 31 mmol/L (ref 22–32)
Calcium: 9.7 mg/dL (ref 8.9–10.3)
Chloride: 101 mmol/L (ref 98–111)
Creatinine: 0.96 mg/dL (ref 0.61–1.24)
GFR, Estimated: >60 mL/min (ref >60)
Glucose, Bld: 97 mg/dL (ref 70–99)
Potassium: 4.3 mmol/L (ref 3.5–5.1)
Sodium: 138 mmol/L (ref 135–145)
Total Bilirubin: 0.5 mg/dL (ref 0.3–1.2)
Total Protein: 6.8 g/dL (ref 6.5–8.1)

## 2021-01-16 NOTE — Progress Notes (Signed)
Hematology and Oncology Follow Up Visit  Justin Rosales [id number] 31-Jul-1950 70 y.o. 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 twice daily-to complete 6 months of therapy in June 2022 Retacrit 40,000 units subcu weekly for Hgb <11     Interim History:  Justin Rosales is back for follow-up.  He just got back from vacation.  He was at the [employer].  He really enjoyed it.  He was out there with Justin family.  Justin hemoglobin is slowly trending upward.  The Retacrit and iron are helping.  The last time he was here, Justin Rosales was 89 with an iron saturation of 18%.  He has had no problems with fever.  There has been no cough or shortness of breath.  He has had no problems with nausea or vomiting.  He has had no leg swelling.  He is off Eliquis.  I am so happy about this.  I think this is part of her reason why Justin hemoglobin is trending upward.  Overall, Justin performance status is ECOG 0.    Medications:  Current Outpatient Medications:    Bepotastine Besilate 1.5 % SOLN, 1 drop 2 (two) times daily., Disp: , Rfl:    Cholecalciferol (DIALYVITE VITAMIN D 5000) 125 MCG (5000 UT) capsule, Take 5,000 Units by mouth daily., Disp: , Rfl:    diphenhydrAMINE HCl, Sleep, (ZZZQUIL PO), Take 10 mLs by mouth daily as needed (sleep)., Disp: , Rfl:    folic acid (FOLVITE) 1 MG tablet, Take 2 tablets (2 mg total) by mouth daily., Disp: 60 tablet, Rfl: 12   inFLIXimab in sodium chloride 0.9 %, Inject into the vein. 01/16/2021 To start taking every 4 weeks., Disp: , Rfl:    mercaptopurine (PURINETHOL) 50 MG tablet, Take 1.5 tablet po QD., Disp: 90 tablet, Rfl: 1   Multiple Vitamin (MULTIVITAMIN WITH MINERALS) TABS tablet, Take 1 tablet by mouth daily., Disp: , Rfl:     NON FORMULARY, Take 1 tablet by mouth daily. Vital reds-energy supplement, Disp: , Rfl:    Saccharomyces boulardii (FLORASTOR PO), Take 1 capsule by mouth in the morning and at bedtime., Disp: , Rfl:    silver sulfADIAZINE (SILVADENE) 1 % cream, Apply 1 application topically daily., Disp: 50 g, Rfl: 0  Allergies: No Known Allergies  Past Medical History, Surgical history, Social history, and Family History were reviewed and updated.  Review of Systems: Review of Systems  Constitutional:  Positive for fatigue.  HENT:  Negative.    Eyes: Negative.   Respiratory:  Positive for shortness of breath.   Cardiovascular: Negative.   Gastrointestinal:  Positive for blood in stool and diarrhea.  Endocrine: Negative.   Genitourinary: Negative.    Musculoskeletal: Negative.   Skin: Negative.   Neurological: Negative.   Hematological: Negative.   Psychiatric/Behavioral: Negative.     Physical Exam:  weight is 205 lb 12.8 oz (93.4 kg). Justin oral temperature is 98.1 F (36.7 C). Justin blood pressure is 164/85 (abnormal) and Justin pulse is 79. Justin respiration is 16 and oxygen saturation is 99%.   Wt Readings from Last 3 Encounters:  01/16/21 205 lb 12.8 oz (93.4 kg)  01/10/21 204 lb (92.5 kg)  12/24/20 203 lb (92.1 kg)    Physical Exam Vitals reviewed.  HENT:  Head: Normocephalic and atraumatic.  Eyes:     Pupils: Pupils are equal, round, and reactive to light.  Cardiovascular:     Rate and Rhythm: Normal rate and regular rhythm.     Heart sounds: Normal heart sounds.  Pulmonary:     Effort: Pulmonary effort is normal.     Breath sounds: Normal breath sounds.  Abdominal:     General: Bowel sounds are normal.     Palpations: Abdomen is soft.  Musculoskeletal:        General: No tenderness or deformity. Normal range of motion.     Cervical back: Normal range of motion.  Lymphadenopathy:     Cervical: No cervical adenopathy.  Skin:    General: Skin is warm and dry.     Findings: No  erythema or rash.  Neurological:     Mental Status: He is alert and oriented to person, place, and time.  Psychiatric:        Behavior: Behavior normal.        Thought Content: Thought content normal.        Judgment: Judgment normal.   Lab Results  Component Value Date   WBC 6.2 01/16/2021   HGB 11.7 (L) 01/16/2021   HCT 37.2 (L) 01/16/2021   MCV 95.1 01/16/2021   PLT 268 01/16/2021     Chemistry      Component Value Date/Time   NA 138 01/16/2021 1156   K 4.3 01/16/2021 1156   CL 101 01/16/2021 1156   CO2 31 01/16/2021 1156   BUN 13 01/16/2021 1156   CREATININE 0.96 01/16/2021 1156      Component Value Date/Time   CALCIUM 9.7 01/16/2021 1156   ALKPHOS 71 01/16/2021 1156   AST 20 01/16/2021 1156   ALT 17 01/16/2021 1156   BILITOT 0.5 01/16/2021 1156       Impression and Plan: Justin Rosales is a very nice 70 year old white male.  He has ulcerative colitis.  He had Clostridium diarrhea.  He became profoundly iron deficient.    I am glad to see that Justin blood counts are improving.  Justin quality of life is improving.  Hopefully, we can get Justin hemoglobin even higher.  He does not need any Retacrit today.  We will see what Justin iron levels are.    We will plan to get him back in another month.  Again I am just happy that Justin blood counts are improving.    [name], MD 6/29/20221:33 PM

## 2021-01-17 ENCOUNTER — Ambulatory Visit (INDEPENDENT_AMBULATORY_CARE_PROVIDER_SITE_OTHER)
Admission: RE | Admit: 2021-01-17 | Discharge: 2021-01-17 | Disposition: A | Discharge status: Home / Self Care | Payer: Medicare Other | Source: Ambulatory Visit | Attending: Emergency Medicine | Admitting: Emergency Medicine

## 2021-01-17 DIAGNOSIS — R918 Other nonspecific abnormal finding of lung field: Secondary | ICD-10-CM

## 2021-01-29 ENCOUNTER — Ambulatory Visit
Admission: EM | Admit: 2021-01-29 | Discharge: 2021-01-29 | Disposition: A | Discharge status: Home / Self Care | Payer: Medicare Other | Attending: Emergency Medicine | Admitting: Emergency Medicine

## 2021-01-29 ENCOUNTER — Other Ambulatory Visit: Payer: Self-pay

## 2021-01-29 DIAGNOSIS — Z20822 Contact with and (suspected) exposure to covid-19: Secondary | ICD-10-CM

## 2021-01-29 MED ORDER — DM-GUAIFENESIN ER 30-600 MG PO TB12: 1.0000 | ORAL_TABLET | Freq: Two times a day (BID) | ORAL | 0 refills | Status: DC

## 2021-01-29 MED ORDER — BENZONATATE 200 MG PO CAPS: 200.0000 mg | ORAL_CAPSULE | Freq: Three times a day (TID) | ORAL | 0 refills | Status: AC | PRN

## 2021-01-29 MED ORDER — NIRMATRELVIR/RITONAVIR (PAXLOVID)TABLET: ORAL_TABLET | ORAL | 0 refills | Status: DC

## 2021-01-29 NOTE — ED Provider Notes (Signed)
[facility]    CSN: [id number] Arrival date & time: 01/29/21  1510      History   Chief Complaint Chief Complaint  Patient presents with   Covid Positive    HPI Justin Rosales is a 70 y.o. male history of ulcerative colitis, prior DVTs presenting today for evaluation of body aches and fever.  Fevers up to 102.  Has had at home positive COVID test.  Reports associated cough congestion, sore throat.  Denies history of asthma, COPD.  Reports remote history of prior tobacco use.  Denies difficulty breathing or shortness of breath.  HPI  Past Medical History:  Diagnosis Date   Anemia    Arthritis    C. 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   Pulmonary nodules/lesions, multiple 10/08/2020   Lower leg DVT (deep venous thromboembolism), acute, right ([place]) 09/28/2020   Chronic anticoagulation 09/18/2020   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 ([place]) 09/18/2020   History of immunosuppression therapy 09/18/2020   Left sided ulcerative (chronic) colitis ([place]) 09/18/2020   Iron deficiency anemia due to chronic blood loss 08/28/2020   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 difficile colitis 06/18/2020   Sepsis ([place]) 06/18/2020   Obesity (BMI 30-39.9) 07/11/2019   Ulcerative colitis with complication ([place]) 17/71/1657   Essential hypertension 08/30/2018    Past Surgical History:  Procedure Laterality Date   COLONOSCOPY     First done at [facility] around age 44. Dr Dorrene German possibly With [facility] x2. last one done around 2012        Home Medications    Prior to Admission medications   Medication Sig Start Date End Date Taking? Authorizing Provider  benzonatate (TESSALON) 200 MG capsule Take 1 capsule (200 mg total) by mouth 3 (three) times daily as needed for up to 7 days for cough. 01/29/21 02/05/21 Yes Reinhold Rickey C, PA-C  dextromethorphan-guaiFENesin (MUCINEX DM) 30-600 MG 12hr tablet Take 1 tablet by mouth 2 (two) times daily. 01/29/21  Yes Joeleen Wortley C, PA-C  nirmatrelvir/ritonavir EUA (PAXLOVID) TABS Take nirmatrelvir (150 mg) two tablets twice daily for 5 days and ritonavir (100 mg) one tablet twice daily for 5 days. 01/29/21  Yes Camia Dipinto C, PA-C  Bepotastine Besilate 1.5 % SOLN 1 drop 2 (two) times daily. 09/07/20   [provider]  Cholecalciferol (DIALYVITE VITAMIN D 5000) 125 MCG (5000 UT) capsule Take 5,000 Units by mouth daily.    [provider]  diphenhydrAMINE HCl, Sleep, (ZZZQUIL PO) Take 10 mLs by mouth daily as needed (sleep).    [provider]  folic acid (FOLVITE) 1 MG  tablet Take 2 tablets (2 mg total) by mouth daily. 01/10/21   [name], MD  inFLIXimab in sodium chloride 0.9 % Inject into the vein. 01/16/2021 To start taking every 4 weeks.    [provider]  mercaptopurine (PURINETHOL) 50 MG tablet Take 1.5 tablet po QD. 01/10/21   Mansouraty, Telford Nab., MD  Multiple Vitamin (MULTIVITAMIN WITH MINERALS) TABS tablet Take 1 tablet by mouth daily. 06/28/20   Dwyane Dee, MD  NON FORMULARY Take 1 tablet by mouth daily. Vital reds-energy supplement    [provider]  Saccharomyces boulardii (FLORASTOR PO) Take 1 capsule by mouth in the morning and at bedtime.    [provider]  silver sulfADIAZINE (SILVADENE) 1 % cream Apply 1 application topically daily. 12/07/20   Saguier, Percell Miller, PA-C    Family History Family History  Problem Relation Age of Onset   Hypertension Mother    Diabetes Mother    Alzheimer's disease Mother    Parkinson's disease Father    Alzheimer's disease Father    Colon cancer Neg Hx    Esophageal cancer Neg Hx    Rectal cancer Neg Hx    Stomach cancer Neg Hx     Social History Social History   Tobacco Use   Smoking status: Former    Packs/day: 1.00    Years: 30.00    Pack years: 30.00    Types: Cigarettes    Quit date: 2001    Years since quitting: 21.5   Smokeless tobacco: Never  Vaping Use   Vaping Use: Never used  Substance Use Topics   Alcohol use: Yes    Comment: ocassionally   Drug use: Not Currently     Allergies   Patient has no known allergies.   Review of Systems Review of Systems  Constitutional:  Positive for fever. Negative for activity change, appetite change, chills and fatigue.  HENT:  Positive for congestion, rhinorrhea, sinus pressure and sore throat. Negative for ear pain and trouble swallowing.   Eyes:  Negative for discharge and redness.  Respiratory:  Positive for cough. Negative for chest tightness and shortness of breath.   Cardiovascular:  Negative for chest pain.  Gastrointestinal:  Negative for abdominal pain, diarrhea, nausea and vomiting.  Musculoskeletal:  Negative for myalgias.  Skin:  Negative for rash.  Neurological:  Negative for dizziness, light-headedness and headaches.    Physical Exam Triage Vital Signs ED Triage Vitals  Enc Vitals Group     BP      Pulse      Resp      Temp      Temp src      SpO2      Weight      Height      Head  Circumference      Peak Flow      Pain Score      Pain Loc      Pain Edu?      Excl. in [place]?    No data found.  Updated Vital Signs BP (!) 155/93   Pulse (!) 105   Temp 99.9 F (37.7 C)   Resp 20   SpO2 95%   Visual Acuity Right Eye Distance:   Left Eye Distance:   Bilateral Distance:    Right Eye Near:   Left Eye Near:    Bilateral Near:     Physical Exam Vitals and nursing note reviewed.  Constitutional:      Appearance: He  is well-developed.     Comments: No acute distress  HENT:     Head: Normocephalic and atraumatic.     Ears:     Comments: Bilateral ears without tenderness to palpation of external auricle, tragus and mastoid, EAC's without erythema or swelling, TM's with good bony landmarks and cone of light. Non erythematous.      Nose: Nose normal.     Mouth/Throat:     Comments: Oral mucosa pink and moist, no tonsillar enlargement or exudate. Posterior pharynx patent and nonerythematous, no uvula deviation or swelling. Normal phonation.  Eyes:     Conjunctiva/sclera: Conjunctivae normal.  Cardiovascular:     Rate and Rhythm: Normal rate.  Pulmonary:     Effort: Pulmonary effort is normal. No respiratory distress.     Comments: Breathing comfortably at rest, CTABL, no wheezing, rales or other adventitious sounds auscultated Abdominal:     General: There is no distension.  Musculoskeletal:        General: Normal range of motion.     Cervical back: Neck supple.  Skin:    General: Skin is warm and dry.  Neurological:     Mental Status: He is alert and oriented to person, place, and time.     UC Treatments / Results  Labs (all labs ordered are listed, but only abnormal results are displayed) Labs Reviewed  NOVEL CORONAVIRUS, NAA    EKG   Radiology No results found.  Procedures Procedures (including critical care time)  Medications Ordered in UC Medications - No data to display  Initial Impression / Assessment and Plan / UC Course  I  have reviewed the triage vital signs and the nursing notes.  Pertinent labs & imaging results that were available during my care of the patient were reviewed by me and considered in my medical decision making (see chart for details).     COVID infection-at home test positive, will confirm with PCR, discussed antivirals with patient and prescribing Paxlovid-GFR in June 2022 greater than 60, check drug reactions..  Discussed symptomatic and supportive care for cough and congestion as well.  Continue to monitor symptoms and breathing,Discussed strict return precautions. Patient verbalized understanding and is agreeable with plan.  Final Clinical Impressions(s) / UC Diagnoses   Final diagnoses:  Suspected COVID-19 virus infection     Discharge Instructions      Begin paxlovid Tessalon for cough Mucinex dm for cough/congestion Follow up if not improving     ED Prescriptions     Medication Sig Dispense Auth. Provider   nirmatrelvir/ritonavir EUA (PAXLOVID) TABS Take nirmatrelvir (150 mg) two tablets twice daily for 5 days and ritonavir (100 mg) one tablet twice daily for 5 days. 30 tablet Lillyn Wieczorek C, PA-C   benzonatate (TESSALON) 200 MG capsule Take 1 capsule (200 mg total) by mouth 3 (three) times daily as needed for up to 7 days for cough. 28 capsule Grason Brailsford C, PA-C   dextromethorphan-guaiFENesin (MUCINEX DM) 30-600 MG 12hr tablet Take 1 tablet by mouth 2 (two) times daily. 14 tablet Shilynn Hoch, [place] C, PA-C      PDMP not reviewed this encounter.   Janith Lima, [place] 01/30/21 [phone]

## 2021-01-29 NOTE — Discharge Instructions (Addendum)
Begin paxlovid Tessalon for cough Mucinex dm for cough/congestion Follow up if not improving

## 2021-01-29 NOTE — ED Triage Notes (Signed)
Pt presents on day 3 of COVID. Reports fever (102), headache, bodyaches, and sore throat. Reports minimal relief with tylenol. Patient took a home covid test and it was positive.

## 2021-01-30 ENCOUNTER — Telehealth: Payer: Medicare Other | Admitting: Family Medicine

## 2021-01-31 LAB — SARS-COV-2, NAA 2 DAY TAT

## 2021-01-31 LAB — NOVEL CORONAVIRUS, NAA: SARS-CoV-2, NAA: DETECTED — AB

## 2021-02-01 ENCOUNTER — Telehealth (HOSPITAL_COMMUNITY): Payer: Self-pay | Admitting: Emergency Medicine

## 2021-02-01 NOTE — Telephone Encounter (Signed)
Attempted to eeach patient to review any questions about positive COVID, LVM.  Results seen on mychart

## 2021-02-05 ENCOUNTER — Non-Acute Institutional Stay (HOSPITAL_COMMUNITY)
Admission: RE | Admit: 2021-02-05 | Discharge: 2021-02-05 | Disposition: A | Discharge status: Home / Self Care | Payer: Medicare Other | Source: Ambulatory Visit | Attending: Internal Medicine | Admitting: Internal Medicine

## 2021-02-05 ENCOUNTER — Other Ambulatory Visit: Payer: Self-pay

## 2021-02-05 DIAGNOSIS — K51919 Ulcerative colitis, unspecified with unspecified complications: Secondary | ICD-10-CM | POA: Insufficient documentation

## 2021-02-05 DIAGNOSIS — K5190 Ulcerative colitis, unspecified, without complications: Secondary | ICD-10-CM

## 2021-02-05 LAB — COMPREHENSIVE METABOLIC PANEL
ALT: 17 U/L (ref 0–44)
AST: 18 U/L (ref 15–41)
Albumin: 3.7 g/dL (ref 3.5–5.0)
Alkaline Phosphatase: 67 U/L (ref 38–126)
Anion gap: 6 (ref 5–15)
BUN: 12 mg/dL (ref 8–23)
CO2: 27 mmol/L (ref 22–32)
Calcium: 9.1 mg/dL (ref 8.9–10.3)
Chloride: 107 mmol/L (ref 98–111)
Creatinine, Ser: 0.68 mg/dL (ref 0.61–1.24)
GFR, Estimated: >60 mL/min (ref >60)
Glucose, Bld: 102 mg/dL — ABNORMAL HIGH (ref 70–99)
Potassium: 4.1 mmol/L (ref 3.5–5.1)
Sodium: 140 mmol/L (ref 135–145)
Total Bilirubin: 0.5 mg/dL (ref 0.3–1.2)
Total Protein: 7 g/dL (ref 6.5–8.1)

## 2021-02-05 LAB — CBC WITH DIFFERENTIAL/PLATELET
Abs Immature Granulocytes: 0.01 10*3/uL (ref 0.00–0.07)
Basophils Absolute: 0 10*3/uL (ref 0.0–0.1)
Basophils Relative: 0 %
Eosinophils Absolute: 0.1 10*3/uL (ref 0.0–0.5)
Eosinophils Relative: 1 %
HCT: 34.8 % — ABNORMAL LOW (ref 39.0–52.0)
Hemoglobin: 11.1 g/dL — ABNORMAL LOW (ref 13.0–17.0)
Immature Granulocytes: 0 %
Lymphocytes Relative: 39 %
Lymphs Abs: 1.5 10*3/uL (ref 0.7–4.0)
MCH: 30.4 pg (ref 26.0–34.0)
MCHC: 31.9 g/dL (ref 30.0–36.0)
MCV: 95.3 fL (ref 80.0–100.0)
Monocytes Absolute: 0.3 10*3/uL (ref 0.1–1.0)
Monocytes Relative: 9 %
Neutro Abs: 1.9 10*3/uL (ref 1.7–7.7)
Neutrophils Relative %: 51 %
Platelets: 270 10*3/uL (ref 150–400)
RBC: 3.65 MIL/uL — ABNORMAL LOW (ref 4.22–5.81)
RDW: 17.2 % — ABNORMAL HIGH (ref 11.5–15.5)
WBC: 3.8 10*3/uL — ABNORMAL LOW (ref 4.0–10.5)
nRBC: 0 % (ref 0.0–0.2)

## 2021-02-05 LAB — SEDIMENTATION RATE: Sed Rate: 35 mm/hr — ABNORMAL HIGH (ref 0–16)

## 2021-02-05 MED ORDER — SODIUM CHLORIDE 0.9 % IV SOLN
INTRAVENOUS | Status: DC | PRN
  Administered 2021-02-05: 250 mL via INTRAVENOUS

## 2021-02-05 MED ORDER — INFLIXIMAB 100 MG IV SOLR
10.0000 mg/kg | INTRAVENOUS | Status: DC
  Administered 2021-02-05: 900 mg via INTRAVENOUS
  Filled 2021-02-05: qty 90

## 2021-02-05 NOTE — Progress Notes (Signed)
PATIENT CARE CENTER NOTE   Diagnosis: Ulcerative Colitis with complication     Provider:  Justice Britain MD     Procedure: Remicade infusion ( 62m/kg 9048m + lab draw ( CBCw/ diff, CMP, Sedimentation rate, High sensitivity CRP)     Note: Patient received Remicade (dose 2 of 6) via PIV. No premeds required per orders. Labs drawn per orders. Infusion titrated per protocol. Patient tolerated well with no adverse reaction. Vital signs stable. Pt declined AVS and declined 1 hour observation post infusion. Patient to come back every 4 weeks for infusion, instructed to make next appointment with scheduler prior to leaving, verbalized understanding.  Alert, oriented and ambulatory at discharge.

## 2021-02-06 LAB — HIGH SENSITIVITY CRP: CRP, High Sensitivity: 1.84 mg/L (ref 0.00–3.00)

## 2021-02-19 ENCOUNTER — Inpatient Hospital Stay: Payer: Medicare Other | Attending: Hematology & Oncology

## 2021-02-19 ENCOUNTER — Inpatient Hospital Stay (HOSPITAL_BASED_OUTPATIENT_CLINIC_OR_DEPARTMENT_OTHER): Payer: Medicare Other | Admitting: Hematology & Oncology

## 2021-02-19 ENCOUNTER — Other Ambulatory Visit: Payer: Self-pay

## 2021-02-19 ENCOUNTER — Encounter: Payer: Self-pay | Admitting: Hematology & Oncology

## 2021-02-19 VITALS — BP 154/60 | HR 76 | Temp 98.0°F | Resp 18 | Ht 71.0 in | Wt 209.0 lb

## 2021-02-19 DIAGNOSIS — K909 Intestinal malabsorption, unspecified: Secondary | ICD-10-CM | POA: Diagnosis not present

## 2021-02-19 DIAGNOSIS — D500 Iron deficiency anemia secondary to blood loss (chronic): Secondary | ICD-10-CM

## 2021-02-19 DIAGNOSIS — D631 Anemia in chronic kidney disease: Secondary | ICD-10-CM

## 2021-02-19 DIAGNOSIS — K5190 Ulcerative colitis, unspecified, without complications: Secondary | ICD-10-CM | POA: Insufficient documentation

## 2021-02-19 LAB — CBC WITH DIFFERENTIAL (CANCER CENTER ONLY)
Abs Immature Granulocytes: 0.02 10*3/uL (ref 0.00–0.07)
Basophils Absolute: 0 10*3/uL (ref 0.0–0.1)
Basophils Relative: 1 %
Eosinophils Absolute: 0.1 10*3/uL (ref 0.0–0.5)
Eosinophils Relative: 3 %
HCT: 35 % — ABNORMAL LOW (ref 39.0–52.0)
Hemoglobin: 11.1 g/dL — ABNORMAL LOW (ref 13.0–17.0)
Immature Granulocytes: 1 %
Lymphocytes Relative: 62 %
Lymphs Abs: 2.5 10*3/uL (ref 0.7–4.0)
MCH: 30.7 pg (ref 26.0–34.0)
MCHC: 31.7 g/dL (ref 30.0–36.0)
MCV: 97 fL (ref 80.0–100.0)
Monocytes Absolute: 0.3 10*3/uL (ref 0.1–1.0)
Monocytes Relative: 8 %
Neutro Abs: 1 10*3/uL — ABNORMAL LOW (ref 1.7–7.7)
Neutrophils Relative %: 25 %
Platelet Count: 213 10*3/uL (ref 150–400)
RBC: 3.61 MIL/uL — ABNORMAL LOW (ref 4.22–5.81)
RDW: 17.6 % — ABNORMAL HIGH (ref 11.5–15.5)
WBC Count: 4 10*3/uL (ref 4.0–10.5)
nRBC: 0 % (ref 0.0–0.2)

## 2021-02-19 LAB — CMP (CANCER CENTER ONLY)
ALT: 14 U/L (ref 0–44)
AST: 16 U/L (ref 15–41)
Albumin: 3.9 g/dL (ref 3.5–5.0)
Alkaline Phosphatase: 75 U/L (ref 38–126)
Anion gap: 5 (ref 5–15)
BUN: 16 mg/dL (ref 8–23)
CO2: 30 mmol/L (ref 22–32)
Calcium: 9.6 mg/dL (ref 8.9–10.3)
Chloride: 105 mmol/L (ref 98–111)
Creatinine: 0.87 mg/dL (ref 0.61–1.24)
GFR, Estimated: >60 mL/min (ref >60)
Glucose, Bld: 98 mg/dL (ref 70–99)
Potassium: 4.4 mmol/L (ref 3.5–5.1)
Sodium: 140 mmol/L (ref 135–145)
Total Bilirubin: 0.5 mg/dL (ref 0.3–1.2)
Total Protein: 6.8 g/dL (ref 6.5–8.1)

## 2021-02-19 LAB — RETICULOCYTES
Immature Retic Fract: 19.6 % — ABNORMAL HIGH (ref 2.3–15.9)
RBC.: 3.55 MIL/uL — ABNORMAL LOW (ref 4.22–5.81)
Retic Count, Absolute: 99 10*3/uL (ref 19.0–186.0)
Retic Ct Pct: 2.8 % (ref 0.4–3.1)

## 2021-02-19 LAB — IRON AND TIBC
Iron: 63 ug/dL (ref 45–182)
Saturation Ratios: 20 % (ref 17.9–39.5)
TIBC: 322 ug/dL (ref 250–450)
UIBC: 259 ug/dL

## 2021-02-19 LAB — FERRITIN: Ferritin: 129 ng/mL (ref 24–336)

## 2021-02-19 NOTE — Progress Notes (Signed)
Hematology and Oncology Follow Up Visit  Justin Rosales [id number] Jul 27, 1950 70 y.o. 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 twice daily-to complete 6 months of therapy in June 2022 Retacrit 40,000 units subcu weekly for Hgb <11     Interim History:  Justin Rosales is back for follow-up.  He seems to be doing quite nicely.  He is on Remicade monthly now.  Hopefully this is helping his ulcerative colitis.  He is off Eliquis.  He is doing nicely off Eliquis.  There is no problems with his legs.  Is had no leg swelling.  He will be going to [place] in September.  I know that he will have a wonderful time over there.   Her last iron studies back in June and showed a ferritin of 322 with an iron saturation of 18%.  He has had no fever.  He did have a COVID about 3 weeks ago.  He had very few symptoms.  I think his whole family had COVID.    Currently, his performance status is ECOG 0.   Medications:  Current Outpatient Medications:    Bepotastine Besilate 1.5 % SOLN, 1 drop 2 (two) times daily., Disp: , Rfl:    Cholecalciferol (DIALYVITE VITAMIN D 5000) 125 MCG (5000 UT) capsule, Take 5,000 Units by mouth daily., Disp: , Rfl:    diphenhydrAMINE HCl, Sleep, (ZZZQUIL PO), Take 10 mLs by mouth daily as needed (sleep)., Disp: , Rfl:    folic acid (FOLVITE) 1 MG tablet, Take 2 tablets (2 mg total) by mouth daily., Disp: 60 tablet, Rfl: 12   inFLIXimab in sodium chloride 0.9 %, Inject into the vein. 01/16/2021 To start taking every 4 weeks., Disp: , Rfl:    mercaptopurine (PURINETHOL) 50 MG tablet, Take 1.5 tablet po QD., Disp: 90 tablet, Rfl: 1   Multiple Vitamin (MULTIVITAMIN WITH MINERALS) TABS tablet, Take 1 tablet by mouth daily., Disp: , Rfl:    NON  FORMULARY, Take 1 tablet by mouth daily. Vital reds-energy supplement, Disp: , Rfl:    Saccharomyces boulardii (FLORASTOR PO), Take 1 capsule by mouth in the morning and at bedtime., Disp: , Rfl:   Allergies: No Known Allergies  Past Medical History, Surgical history, Social history, and Family History were reviewed and updated.  Review of Systems: Review of Systems  Constitutional:  Positive for fatigue.  HENT:  Negative.    Eyes: Negative.   Respiratory:  Positive for shortness of breath.   Cardiovascular: Negative.   Gastrointestinal:  Positive for blood in stool and diarrhea.  Endocrine: Negative.   Genitourinary: Negative.    Musculoskeletal: Negative.   Skin: Negative.   Neurological: Negative.   Hematological: Negative.   Psychiatric/Behavioral: Negative.     Physical Exam:  height is 5' 11"  (1.803 m) and weight is 209 lb (94.8 kg). His oral temperature is 98 F (36.7 C). His blood pressure is 154/60 (abnormal) and his pulse is 76. His respiration is 18 and oxygen saturation is 99%.   Wt Readings from Last 3 Encounters:  02/19/21 209 lb (94.8 kg)  01/16/21 205 lb 12.8 oz (93.4 kg)  01/10/21 204 lb (92.5 kg)    Physical Exam Vitals reviewed.  HENT:     Head: Normocephalic and atraumatic.  Eyes:  Pupils: Pupils are equal, round, and reactive to light.  Cardiovascular:     Rate and Rhythm: Normal rate and regular rhythm.     Heart sounds: Normal heart sounds.  Pulmonary:     Effort: Pulmonary effort is normal.     Breath sounds: Normal breath sounds.  Abdominal:     General: Bowel sounds are normal.     Palpations: Abdomen is soft.  Musculoskeletal:        General: No tenderness or deformity. Normal range of motion.     Cervical back: Normal range of motion.  Lymphadenopathy:     Cervical: No cervical adenopathy.  Skin:    General: Skin is warm and dry.     Findings: No erythema or rash.  Neurological:     Mental Status: He is alert and oriented to  person, place, and time.  Psychiatric:        Behavior: Behavior normal.        Thought Content: Thought content normal.        Judgment: Judgment normal.   Lab Results  Component Value Date   WBC 4.0 02/19/2021   HGB 11.1 (L) 02/19/2021   HCT 35.0 (L) 02/19/2021   MCV 97.0 02/19/2021   PLT 213 02/19/2021     Chemistry      Component Value Date/Time   NA 140 02/19/2021 0926   K 4.4 02/19/2021 0926   CL 105 02/19/2021 0926   CO2 30 02/19/2021 0926   BUN 16 02/19/2021 0926   CREATININE 0.87 02/19/2021 0926      Component Value Date/Time   CALCIUM 9.6 02/19/2021 0926   ALKPHOS 75 02/19/2021 0926   AST 16 02/19/2021 0926   ALT 14 02/19/2021 0926   BILITOT 0.5 02/19/2021 0926       Impression and Plan: Justin Rosales is a very nice 70 year old white male.  He has ulcerative colitis.  He had Clostridium diarrhea.  He became profoundly iron deficient.    I am glad to see that his blood counts are stabilizing.  This should be a good sign.  Now that he is off Eliquis.  Hopefully we will see his blood counts go up.  We will have to see what his iron level is.  If his iron saturation is below 20%, we will going give him IV iron.  He does not need Retacrit today.  I will get him back in 1 month.  I want to make sure we get her back before he goes to [place].    [name], MD 8/2/202210:17 AM

## 2021-02-20 ENCOUNTER — Telehealth: Payer: Self-pay

## 2021-02-20 ENCOUNTER — Telehealth: Payer: Self-pay | Admitting: *Deleted

## 2021-02-20 NOTE — Telephone Encounter (Signed)
-----   Message from [name], MD sent at 02/20/2021  6:02 AM EDT ----- Call - the iron level is ok!  Justin Rosales

## 2021-02-20 NOTE — Telephone Encounter (Signed)
Notified pt of results. Reviewed upcoming appts. No concerns at this time.

## 2021-03-05 ENCOUNTER — Non-Acute Institutional Stay (HOSPITAL_COMMUNITY)
Admission: RE | Admit: 2021-03-05 | Discharge: 2021-03-05 | Disposition: A | Discharge status: Home / Self Care | Payer: Medicare Other | Source: Ambulatory Visit | Attending: Internal Medicine | Admitting: Internal Medicine

## 2021-03-05 ENCOUNTER — Other Ambulatory Visit: Payer: Self-pay

## 2021-03-05 DIAGNOSIS — K5190 Ulcerative colitis, unspecified, without complications: Secondary | ICD-10-CM | POA: Diagnosis not present

## 2021-03-05 DIAGNOSIS — K51919 Ulcerative colitis, unspecified with unspecified complications: Secondary | ICD-10-CM

## 2021-03-05 LAB — COMPREHENSIVE METABOLIC PANEL
ALT: 17 U/L (ref 0–44)
AST: 20 U/L (ref 15–41)
Albumin: 3.8 g/dL (ref 3.5–5.0)
Alkaline Phosphatase: 75 U/L (ref 38–126)
Anion gap: 8 (ref 5–15)
BUN: 16 mg/dL (ref 8–23)
CO2: 25 mmol/L (ref 22–32)
Calcium: 9.1 mg/dL (ref 8.9–10.3)
Chloride: 106 mmol/L (ref 98–111)
Creatinine, Ser: 0.82 mg/dL (ref 0.61–1.24)
GFR, Estimated: >60 mL/min (ref >60)
Glucose, Bld: 96 mg/dL (ref 70–99)
Potassium: 3.8 mmol/L (ref 3.5–5.1)
Sodium: 139 mmol/L (ref 135–145)
Total Bilirubin: 0.8 mg/dL (ref 0.3–1.2)
Total Protein: 6.9 g/dL (ref 6.5–8.1)

## 2021-03-05 LAB — CBC WITH DIFFERENTIAL/PLATELET
Basophils Absolute: 0 10*3/uL (ref 0.0–0.1)
Basophils Relative: 1 %
Eosinophils Absolute: 0.1 10*3/uL (ref 0.0–0.5)
Eosinophils Relative: 2 %
HCT: 37.1 % — ABNORMAL LOW (ref 39.0–52.0)
Hemoglobin: 12 g/dL — ABNORMAL LOW (ref 13.0–17.0)
Lymphocytes Relative: 35 %
Lymphs Abs: 1.3 10*3/uL (ref 0.7–4.0)
MCH: 32.4 pg (ref 26.0–34.0)
MCHC: 32.3 g/dL (ref 30.0–36.0)
MCV: 100.3 fL — ABNORMAL HIGH (ref 80.0–100.0)
Monocytes Absolute: 0.3 10*3/uL (ref 0.1–1.0)
Monocytes Relative: 9 %
Neutro Abs: 1.9 10*3/uL (ref 1.7–7.7)
Neutrophils Relative %: 53 %
Platelets: 239 10*3/uL (ref 150–400)
RBC: 3.7 MIL/uL — ABNORMAL LOW (ref 4.22–5.81)
RDW: 17.3 % — ABNORMAL HIGH (ref 11.5–15.5)
WBC: 3.7 10*3/uL — ABNORMAL LOW (ref 4.0–10.5)
nRBC: 0 % (ref 0.0–0.2)

## 2021-03-05 LAB — SEDIMENTATION RATE: Sed Rate: 45 mm/hr — ABNORMAL HIGH (ref 0–16)

## 2021-03-05 MED ORDER — SODIUM CHLORIDE 0.9 % IV SOLN
INTRAVENOUS | Status: DC | PRN
  Administered 2021-03-05: 250 mL via INTRAVENOUS

## 2021-03-05 MED ORDER — SODIUM CHLORIDE 0.9 % IV SOLN
10.0000 mg/kg | INTRAVENOUS | Status: DC
  Administered 2021-03-05: 900 mg via INTRAVENOUS
  Filled 2021-03-05: qty 90

## 2021-03-05 NOTE — Progress Notes (Signed)
PATIENT CARE CENTER NOTE   Diagnosis: Ulcerative colitis     Provider: Justice Britain MD     Procedure: Remicade infusion 77m/kg + lab draw     Note: Patient received Remicade infusion ( dose 3 of 6) via PIV. Premeds not required per orders. Infusion titrated per protocol. Labs drawn per orders ( CBC w diff, CMP, Sedimentation rate, High Sensitivity CRP). Patient tolerated well with no adverse reaction. Vital signs stable. Pt declined AVS . Patient to come back every 4 weeks for infusion, states he already has next infusion scheduled. Alert, oriented and ambulatory at discharge.

## 2021-03-06 LAB — HIGH SENSITIVITY CRP: CRP, High Sensitivity: 2.32 mg/L (ref 0.00–3.00)

## 2021-03-14 ENCOUNTER — Ambulatory Visit: Payer: Medicare Other | Admitting: Gastroenterology

## 2021-03-15 ENCOUNTER — Encounter: Payer: Self-pay | Admitting: Gastroenterology

## 2021-03-15 ENCOUNTER — Ambulatory Visit (INDEPENDENT_AMBULATORY_CARE_PROVIDER_SITE_OTHER): Payer: Medicare Other | Admitting: Gastroenterology

## 2021-03-15 ENCOUNTER — Other Ambulatory Visit: Payer: Medicare Other

## 2021-03-15 VITALS — BP 154/80 | HR 84 | Ht 71.0 in | Wt 207.1 lb

## 2021-03-15 DIAGNOSIS — Z9225 Personal history of immunosupression therapy: Secondary | ICD-10-CM | POA: Diagnosis not present

## 2021-03-15 DIAGNOSIS — K529 Noninfective gastroenteritis and colitis, unspecified: Secondary | ICD-10-CM | POA: Diagnosis not present

## 2021-03-15 DIAGNOSIS — Z862 Personal history of diseases of the blood and blood-forming organs and certain disorders involving the immune mechanism: Secondary | ICD-10-CM

## 2021-03-15 DIAGNOSIS — K5190 Ulcerative colitis, unspecified, without complications: Secondary | ICD-10-CM | POA: Diagnosis not present

## 2021-03-15 MED ORDER — PLENVU 140 G PO SOLR: 1.0000 | ORAL | 0 refills | Status: DC

## 2021-03-15 NOTE — Progress Notes (Signed)
[facility] VISIT   Primary Care Provider Shelda Pal, [address] [place] [address] [phone]  Patient Profile: Justin Rosales is a 70 y.o. male with a pmh significant for chronic left-sided ulcerative colitis (now on 6MP + Remicade 11m/kg Q4W), prior C. difficile infection (requiring p.o. vancomycin and fidaxomycin), history of Candida esophagitis, chronic anemia with iron deficiency (s/p IV Iron infusions), hypertension, hyperlipidemia, prior VTE (now off AC).  [facility] for an evaluation and management of problem(s) noted below:  Problem List 1. IBD (inflammatory bowel disease)   2. History of anemia   3. History of immunosuppression therapy   4. Chronic ulcerative colitis without complication, unspecified location ([facility]      History of Present Illness Please see prior notes from the GI team and myself for full details of HPI.  Interval History Today, the patient returns for scheduled follow-up.  His wife is not with him today as she has [place] with [place].  Patient has been transition to every 4-week Remicade dosing at 10 mg/kg.  The patient states that he is doing very well at this time.  This is the best he has felt in a long time per his report.  The patient is having between 1 and 3 soft to firm bowel movements per day.  He notes no blood in his stools.  He is not having any urgency as he did previously.  No nocturnal bowel movements are occurring.  He denies any incontinence.   BMs per day -1-3 Nocturnal BMs -none Blood -none Mucous -none Tenesmus -none Urgency -none Skin Manifestations -none Eye Manifestations -none Joint Manifestations -none  GI Review of Systems Positive as above Negative for odynophagia, dysphagia, abdominal pain, nausea, vomiting, melena, hematochezia   Review of Systems General: Denies fevers/chills/weight loss  unintentionally HEENT: Denies oral lesions or oral thrush Cardiovascular: Denies chest pain/palpitations Pulmonary: Denies shortness of breath Gastroenterological: See HPI Genitourinary: Denies darkened urine Hematological: Denies easy bruising or bleeding at this time Dermatological: Denies jaundice Psychological: Mood is stable   Medications Current Outpatient Medications  Medication Sig Dispense Refill   Bepotastine Besilate 1.5 % SOLN 1 drop 2 (two) times daily.     Cholecalciferol (DIALYVITE VITAMIN D 5000) 125 MCG (5000 UT) capsule Take 5,000 Units by mouth daily.     diphenhydrAMINE HCl, Sleep, (ZZZQUIL PO) Take 10 mLs by mouth daily as needed (sleep).     folic acid (FOLVITE) 1 MG tablet Take 2 tablets (2 mg total) by mouth daily. 60 tablet 12   inFLIXimab in sodium chloride 0.9 % Inject into the vein. 01/16/2021 To start taking every 4 weeks.     mercaptopurine (PURINETHOL) 50 MG tablet Take 1.5 tablet po QD. 90 tablet 1   Multiple Vitamin (MULTIVITAMIN WITH MINERALS) TABS tablet Take 1 tablet by mouth daily.     NON FORMULARY Take 1 tablet by mouth daily. Vital reds-energy supplement     PEG-KCl-NaCl-NaSulf-Na Asc-C (PLENVU) 140 g SOLR Take 1 kit by mouth as directed. Use coupon: BIN: [id number]: CNRX Group: [id number]:: [id number] each 0   Saccharomyces boulardii (FLORASTOR PO) Take 1 capsule by mouth in the morning and at bedtime.     No current facility-administered medications for this visit.    Allergies No Known Allergies  Histories Past Medical History:  Diagnosis Date   Anemia    Arthritis    C. 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 Dr Dorrene German possibly With [facility] x2. last one done around 2012    Social History   Socioeconomic History   Marital status: Married    Spouse name: Judeen Hammans   Number of children: 2   Years of education: Not on file   Highest education level: Not on file  Occupational History   Occupation: Emergency planning/management officer   Occupation: retired  Tobacco Use   Smoking status: Former    Packs/day: 1.00    Years: 30.00    Pack years: 30.00    Types: Cigarettes    Quit date: 2001    Years since quitting: 21.6   Smokeless tobacco: Never  Vaping Use   Vaping Use: Never used  Substance and Sexual Activity   Alcohol use: Yes    Comment: ocassionally   Drug use: Not Currently   Sexual activity: Not on file  Other Topics Concern   Not on file  Social History Narrative   Not on file   Social Determinants of Health   Financial Resource Strain: Not on file  Food Insecurity: Not on file  Transportation Needs: Not on file  Physical Activity: Not on file  Stress: Not on file  Social Connections: Not on file  Intimate Partner Violence: Not on file   Family History  Problem Relation Age of Onset   Hypertension Mother    Diabetes Mother    Alzheimer's disease Mother    Parkinson's disease Father    Alzheimer's disease Father    Colon cancer Neg Hx    Esophageal cancer Neg Hx    Rectal cancer Neg Hx    Stomach cancer Neg Hx    I have reviewed his medical, social, and family history in detail and updated the electronic medical record as necessary.    PHYSICAL EXAMINATION  BP (!) 154/80 (BP Location: Left Arm, Patient Position: Sitting, Cuff Size: Normal)   Pulse 84   Ht 5' 11"  (1.803 m)   Wt 207 lb 2 oz (94 kg)   SpO2 97%   BMI 28.89 kg/m  Wt Readings from Last 3 Encounters:  03/15/21 207 lb 2 oz (94 kg)  02/19/21 209  lb (94.8 kg)  01/16/21 205 lb 12.8 oz (93.4 kg)  GEN: NAD, appears stated age PSYCH: Cooperative, without pressured speech EYE: Conjunctivae pink, sclerae anicteric ENT: MMM CV: Nontachycardic RESP: No audible wheezing GI: NABS, soft, rounded, NT/ND, without rebound MSK/EXT: No lower extremity edema SKIN: No jaundice NEURO:  Alert & Oriented x 3, no focal deficits   REVIEW OF DATA  I reviewed the following data at the time of this encounter:  GI Procedures and Studies  No new studies to review  Laboratory Studies  Reviewed those in epic and care everywhere  Imaging Studies  No new imaging to review   ASSESSMENT  Mr. Lamarque is a 70 y.o. male with a pmh significant for chronic left-sided ulcerative colitis (now on 6MP + Remicade 94m/kg Q4W), prior C.  difficile infection (requiring p.o. vancomycin and fidaxomycin), history of Candida esophagitis, chronic anemia with iron deficiency (s/p IV Iron infusions), hypertension, hyperlipidemia, prior VTE (now off AC).  The patient is seen today for evaluation and management of:  1. IBD (inflammatory bowel disease)   2. History of anemia   3. History of immunosuppression therapy   4. Chronic ulcerative colitis without complication, unspecified location [facility])    The patient is hemodynamically and clinically stable.  He is doing well on his every 4-week infusion of Remicade after his drug monitoring levels had suggested that he was low.  We are going to let him continue every 4-week dosing for now and continue his current 6-MP dosing.  In October of this year we will obtain therapeutic drug monitoring and antibody levels to evaluate to see how the patient is doing on his current regimen.  Within 6 to 8 weeks after that has been completed, he will undergo a repeat colonoscopy to ensure mucosal healing.  Time will tell to see how he does.   We will follow-up his CRP and ESR as well as other electrolytes/liver tests and blood counts with each of his  infusions.  All patient questions were answered to the best of my ability, and the patient agrees to the aforementioned plan of action with follow-up as indicated.   PLAN  Continue Remicade 10 mg/kg every 4 weeks Continue 6-MP 75 mg daily Continue protein supplementation TDM of Remicade in October as well as TPMT metabolites We will plan repeat endoscopic evaluation in November/December after patient has had at least 3 months of Remicade every 4 week dosing ESR/CRP/CMP/CBC to be obtained with each infusion Will obtain a fecal calprotectin   Orders Placed This Encounter  Procedures   Calprotectin, Fecal   CBC   Comp Met (CMET)   Sedimentation rate   C-reactive protein   Thiopurine Metabolites   Infliximab+Ab (Serial Monitor)   Ambulatory referral to Gastroenterology     New Prescriptions   PEG-KCL-NACL-NASULF-NA ASC-C (PLENVU) 140 G SOLR    Take 1 kit by mouth as directed. Use coupon: BIN: [id number] PNC: CNRX Group: [id number] ID: [id number]   Modified Medications   No medications on file    Planned Follow Up No follow-ups on file.   Total Time in Face-to-Face and in Coordination of Care for patient including independent/personal interpretation/review of prior testing, medical history, examination, medication adjustment, communicating results with the patient directly, and documentation with the EHR is 25 minutes.   Justice Britain, MD [facility] Office # [phone]

## 2021-03-15 NOTE — Patient Instructions (Signed)
Your provider has requested that you go to the basement level for lab work on 04/22/21. Press "B" on the elevator. The lab is located at the first door on the left as you exit the elevator.  You have been scheduled for a colonoscopy. Please follow written instructions given to you at your visit today.  Please pick up your prep supplies at the pharmacy within the next 1-3 days. If you use inhalers (even only as needed), please bring them with you on the day of your procedure.  We have sent the following medications to your pharmacy for you to pick up at your convenience: Plenvu   Due to recent changes in healthcare laws, you may see the results of your imaging and laboratory studies on MyChart before your provider has had a chance to review them.  We understand that in some cases there may be results that are confusing or concerning to you. Not all laboratory results come back in the same time frame and the provider may be waiting for multiple results in order to interpret others.  Please give [place] 48 hours in order for your provider to thoroughly review all the results before contacting the office for clarification of your results.   If you are age 70 or older, your body mass index should be between 23-30. Your Body mass index is 28.89 kg/m. If this is out of the aforementioned range listed, please consider follow up with your Primary Care Provider.  __________________________________________________________  The [facility] providers would like to encourage you to use [facility] to communicate with providers for non-urgent requests or questions.  Due to long hold times on the telephone, sending your provider a message by [facility] may be a faster and more efficient way to get a response.  Please allow 48 business hours for a response.  Please remember that this is for non-urgent requests.   Thank you for choosing me and [facility].  Dr. Rush Landmark

## 2021-03-18 ENCOUNTER — Encounter: Payer: Self-pay | Admitting: Gastroenterology

## 2021-04-02 ENCOUNTER — Non-Acute Institutional Stay (HOSPITAL_COMMUNITY)
Admission: RE | Admit: 2021-04-02 | Discharge: 2021-04-02 | Disposition: A | Discharge status: Home / Self Care | Payer: Medicare Other | Source: Ambulatory Visit | Attending: Internal Medicine | Admitting: Internal Medicine

## 2021-04-02 ENCOUNTER — Other Ambulatory Visit: Payer: Self-pay

## 2021-04-02 DIAGNOSIS — K51019 Ulcerative (chronic) pancolitis with unspecified complications: Secondary | ICD-10-CM | POA: Diagnosis not present

## 2021-04-02 DIAGNOSIS — K51919 Ulcerative colitis, unspecified with unspecified complications: Secondary | ICD-10-CM

## 2021-04-02 LAB — CBC WITH DIFFERENTIAL/PLATELET
Abs Immature Granulocytes: 0.01 10*3/uL (ref 0.00–0.07)
Basophils Absolute: 0 10*3/uL (ref 0.0–0.1)
Basophils Relative: 1 %
Eosinophils Absolute: 0.1 10*3/uL (ref 0.0–0.5)
Eosinophils Relative: 4 %
HCT: 36.7 % — ABNORMAL LOW (ref 39.0–52.0)
Hemoglobin: 12 g/dL — ABNORMAL LOW (ref 13.0–17.0)
Immature Granulocytes: 0 %
Lymphocytes Relative: 42 %
Lymphs Abs: 1.6 10*3/uL (ref 0.7–4.0)
MCH: 32.3 pg (ref 26.0–34.0)
MCHC: 32.7 g/dL (ref 30.0–36.0)
MCV: 98.9 fL (ref 80.0–100.0)
Monocytes Absolute: 0.3 10*3/uL (ref 0.1–1.0)
Monocytes Relative: 8 %
Neutro Abs: 1.7 10*3/uL (ref 1.7–7.7)
Neutrophils Relative %: 45 %
Platelets: 244 10*3/uL (ref 150–400)
RBC: 3.71 MIL/uL — ABNORMAL LOW (ref 4.22–5.81)
RDW: 15.2 % (ref 11.5–15.5)
WBC: 3.7 10*3/uL — ABNORMAL LOW (ref 4.0–10.5)
nRBC: 0 % (ref 0.0–0.2)

## 2021-04-02 LAB — SEDIMENTATION RATE: Sed Rate: 24 mm/hr — ABNORMAL HIGH (ref 0–16)

## 2021-04-02 LAB — COMPREHENSIVE METABOLIC PANEL
ALT: 18 U/L (ref 0–44)
AST: 21 U/L (ref 15–41)
Albumin: 3.9 g/dL (ref 3.5–5.0)
Alkaline Phosphatase: 78 U/L (ref 38–126)
Anion gap: 6 (ref 5–15)
BUN: 18 mg/dL (ref 8–23)
CO2: 27 mmol/L (ref 22–32)
Calcium: 9.4 mg/dL (ref 8.9–10.3)
Chloride: 108 mmol/L (ref 98–111)
Creatinine, Ser: 0.88 mg/dL (ref 0.61–1.24)
GFR, Estimated: >60 mL/min (ref >60)
Glucose, Bld: 100 mg/dL — ABNORMAL HIGH (ref 70–99)
Potassium: 4.3 mmol/L (ref 3.5–5.1)
Sodium: 141 mmol/L (ref 135–145)
Total Bilirubin: 0.5 mg/dL (ref 0.3–1.2)
Total Protein: 7.3 g/dL (ref 6.5–8.1)

## 2021-04-02 MED ORDER — SODIUM CHLORIDE 0.9 % IV SOLN
10.0000 mg/kg | INTRAVENOUS | Status: DC
  Administered 2021-04-02: 900 mg via INTRAVENOUS
  Filled 2021-04-02: qty 90

## 2021-04-02 MED ORDER — SODIUM CHLORIDE 0.9 % IV SOLN
INTRAVENOUS | Status: DC | PRN
  Administered 2021-04-02: 250 mL via INTRAVENOUS

## 2021-04-02 NOTE — Progress Notes (Signed)
PATIENT CARE CENTER NOTE   Diagnosis: Ulcerative Colitis     Provider: Justice Britain MD     Procedure: Remicade infusion 58m/kg + lab draw     Note: Patient received Remicade infusion (dose #4 of 6) via PIV. Labs drawn per orders (CBC, CMP, Sedimentation rate, High sensitivity CRP). Pre meds not required per orders.  Infusion titrated per protocol. Patient tolerated well with no adverse reaction. Vital signs stable. Pt declined AVS. Patient to come back every 4 weeks for infusion, instructed to schedule next appointment prior to leaving, pt verbalized understanding. Alert, oriented and ambulatory at discharge.

## 2021-04-03 ENCOUNTER — Inpatient Hospital Stay: Payer: Medicare Other

## 2021-04-03 ENCOUNTER — Encounter: Payer: Self-pay | Admitting: Hematology & Oncology

## 2021-04-03 ENCOUNTER — Inpatient Hospital Stay (HOSPITAL_BASED_OUTPATIENT_CLINIC_OR_DEPARTMENT_OTHER): Payer: Medicare Other | Admitting: Hematology & Oncology

## 2021-04-03 ENCOUNTER — Inpatient Hospital Stay: Payer: Medicare Other | Attending: Hematology & Oncology

## 2021-04-03 ENCOUNTER — Telehealth: Payer: Self-pay

## 2021-04-03 VITALS — BP 165/82 | HR 83 | Temp 98.2°F | Resp 16 | Wt 207.0 lb

## 2021-04-03 DIAGNOSIS — D631 Anemia in chronic kidney disease: Secondary | ICD-10-CM | POA: Diagnosis not present

## 2021-04-03 DIAGNOSIS — D500 Iron deficiency anemia secondary to blood loss (chronic): Secondary | ICD-10-CM | POA: Diagnosis present

## 2021-04-03 DIAGNOSIS — K5190 Ulcerative colitis, unspecified, without complications: Secondary | ICD-10-CM | POA: Diagnosis not present

## 2021-04-03 DIAGNOSIS — K909 Intestinal malabsorption, unspecified: Secondary | ICD-10-CM | POA: Diagnosis not present

## 2021-04-03 LAB — IRON AND TIBC
Iron: 67 ug/dL (ref 42–163)
Saturation Ratios: 21 % (ref 20–55)
TIBC: 315 ug/dL (ref 202–409)
UIBC: 248 ug/dL (ref 117–376)

## 2021-04-03 LAB — CBC WITH DIFFERENTIAL (CANCER CENTER ONLY)
Abs Immature Granulocytes: 0.01 10*3/uL (ref 0.00–0.07)
Basophils Absolute: 0 10*3/uL (ref 0.0–0.1)
Basophils Relative: 1 %
Eosinophils Absolute: 0.1 10*3/uL (ref 0.0–0.5)
Eosinophils Relative: 3 %
HCT: 37.4 % — ABNORMAL LOW (ref 39.0–52.0)
Hemoglobin: 12.4 g/dL — ABNORMAL LOW (ref 13.0–17.0)
Immature Granulocytes: 0 %
Lymphocytes Relative: 47 %
Lymphs Abs: 2.1 10*3/uL (ref 0.7–4.0)
MCH: 32.5 pg (ref 26.0–34.0)
MCHC: 33.2 g/dL (ref 30.0–36.0)
MCV: 98.2 fL (ref 80.0–100.0)
Monocytes Absolute: 0.3 10*3/uL (ref 0.1–1.0)
Monocytes Relative: 8 %
Neutro Abs: 1.8 10*3/uL (ref 1.7–7.7)
Neutrophils Relative %: 41 %
Platelet Count: 218 10*3/uL (ref 150–400)
RBC: 3.81 MIL/uL — ABNORMAL LOW (ref 4.22–5.81)
RDW: 14.7 % (ref 11.5–15.5)
WBC Count: 4.3 10*3/uL (ref 4.0–10.5)
nRBC: 0 % (ref 0.0–0.2)

## 2021-04-03 LAB — CMP (CANCER CENTER ONLY)
ALT: 13 U/L (ref 0–44)
AST: 16 U/L (ref 15–41)
Albumin: 4.1 g/dL (ref 3.5–5.0)
Alkaline Phosphatase: 73 U/L (ref 38–126)
Anion gap: 7 (ref 5–15)
BUN: 20 mg/dL (ref 8–23)
CO2: 28 mmol/L (ref 22–32)
Calcium: 9.5 mg/dL (ref 8.9–10.3)
Chloride: 106 mmol/L (ref 98–111)
Creatinine: 0.85 mg/dL (ref 0.61–1.24)
GFR, Estimated: >60 mL/min (ref >60)
Glucose, Bld: 97 mg/dL (ref 70–99)
Potassium: 4.4 mmol/L (ref 3.5–5.1)
Sodium: 141 mmol/L (ref 135–145)
Total Bilirubin: 0.4 mg/dL (ref 0.3–1.2)
Total Protein: 6.7 g/dL (ref 6.5–8.1)

## 2021-04-03 LAB — HIGH SENSITIVITY CRP: CRP, High Sensitivity: 0.79 mg/L (ref 0.00–3.00)

## 2021-04-03 LAB — RETICULOCYTES
Immature Retic Fract: 13.2 % (ref 2.3–15.9)
RBC.: 3.81 MIL/uL — ABNORMAL LOW (ref 4.22–5.81)
Retic Count, Absolute: 63.6 10*3/uL (ref 19.0–186.0)
Retic Ct Pct: 1.7 % (ref 0.4–3.1)

## 2021-04-03 LAB — FERRITIN: Ferritin: 51 ng/mL (ref 24–336)

## 2021-04-03 NOTE — Telephone Encounter (Signed)
Appts made per 04/03/21 los, pt req to view on mychart and not print   Justin Rosales

## 2021-04-03 NOTE — Progress Notes (Signed)
Hematology and Oncology Follow Up Visit  Justin Rosales [id number] 11-Apr-1951 70 y.o. 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 twice daily-to complete 6 months of therapy in June 2022 Retacrit 40,000 units subcu weekly for Hgb <11     Interim History:  Justin Rosales is back for follow-up.  He seems to be doing quite nicely.  He is on Remicade monthly now.  He is doing well with Remicade.  He has had a few side effects from the Remicade.  Thankfully, it is helped his ulcerative colitis.  He is going to [place] next week.  He will be down there for couple weeks.  I am sure he will have a wonderful time.  I told him to make sure he drinks a lot of water and wears a lot of sunscreen.  We last saw him in early August, his iron studies showed a ferritin of 129 with an iron saturation of 20%.    There has been no problems with diarrhea.  He has had no obvious bleeding.  He has had no leg swelling.  There is been no rashes.  He has had no headache.  He had COVID I think back in July.  He is doing well with respect to any side effects.  Overall, his performance status is ECOG 1.     Medications:  Current Outpatient Medications:    Bepotastine Besilate 1.5 % SOLN, 1 drop 2 (two) times daily., Disp: , Rfl:    Cholecalciferol (DIALYVITE VITAMIN D 5000) 125 MCG (5000 UT) capsule, Take 5,000 Units by mouth daily., Disp: , Rfl:    diphenhydrAMINE HCl, Sleep, (ZZZQUIL PO), Take 10 mLs by mouth daily as needed (sleep)., Disp: , Rfl:    folic acid (FOLVITE) 1 MG tablet, Take 2 tablets (2 mg total) by mouth daily., Disp: 60 tablet, Rfl: 12   inFLIXimab in sodium chloride 0.9 %, Inject into the vein. 01/16/2021 To start taking every 4 weeks., Disp: , Rfl:     mercaptopurine (PURINETHOL) 50 MG tablet, Take 1.5 tablet po QD., Disp: 90 tablet, Rfl: 1   Multiple Vitamin (MULTIVITAMIN WITH MINERALS) TABS tablet, Take 1 tablet by mouth daily., Disp: , Rfl:    NON FORMULARY, Take 1 tablet by mouth daily. Vital reds-energy supplement, Disp: , Rfl:    PEG-KCl-NaCl-NaSulf-Na Asc-C (PLENVU) 140 g SOLR, Take 1 kit by mouth as directed. Use coupon: BIN: [id number] PNC: CNRX Group: [id number] ID: [id number], Disp: 1 each, Rfl: 0   Saccharomyces boulardii (FLORASTOR PO), Take 1 capsule by mouth in the morning and at bedtime., Disp: , Rfl:   Allergies: No Known Allergies  Past Medical History, Surgical history, Social history, and Family History were reviewed and updated.  Review of Systems: Review of Systems  Constitutional:  Positive for fatigue.  HENT:  Negative.    Eyes: Negative.   Respiratory:  Positive for shortness of breath.   Cardiovascular: Negative.   Gastrointestinal:  Positive for blood in stool and diarrhea.  Endocrine: Negative.   Genitourinary: Negative.    Musculoskeletal: Negative.   Skin: Negative.   Neurological: Negative.   Hematological: Negative.   Psychiatric/Behavioral: Negative.     Physical Exam:  weight is 207 lb (93.9 kg). His oral temperature is 98.2 F (36.8 C). His blood pressure  is 165/82 (abnormal) and his pulse is 83. His respiration is 16 and oxygen saturation is 98%.   Wt Readings from Last 3 Encounters:  04/03/21 207 lb (93.9 kg)  04/02/21 207 lb 9.6 oz (94.2 kg)  03/15/21 207 lb 2 oz (94 kg)    Physical Exam Vitals reviewed.  HENT:     Head: Normocephalic and atraumatic.  Eyes:     Pupils: Pupils are equal, round, and reactive to light.  Cardiovascular:     Rate and Rhythm: Normal rate and regular rhythm.     Heart sounds: Normal heart sounds.  Pulmonary:     Effort: Pulmonary effort is normal.     Breath sounds: Normal breath sounds.  Abdominal:     General: Bowel sounds are normal.     Palpations:  Abdomen is soft.  Musculoskeletal:        General: No tenderness or deformity. Normal range of motion.     Cervical back: Normal range of motion.  Lymphadenopathy:     Cervical: No cervical adenopathy.  Skin:    General: Skin is warm and dry.     Findings: No erythema or rash.  Neurological:     Mental Status: He is alert and oriented to person, place, and time.  Psychiatric:        Behavior: Behavior normal.        Thought Content: Thought content normal.        Judgment: Judgment normal.   Lab Results  Component Value Date   WBC 4.3 04/03/2021   HGB 12.4 (L) 04/03/2021   HCT 37.4 (L) 04/03/2021   MCV 98.2 04/03/2021   PLT 218 04/03/2021     Chemistry      Component Value Date/Time   NA 141 04/03/2021 0756   K 4.4 04/03/2021 0756   CL 106 04/03/2021 0756   CO2 28 04/03/2021 0756   BUN 20 04/03/2021 0756   CREATININE 0.85 04/03/2021 0756      Component Value Date/Time   CALCIUM 9.5 04/03/2021 0756   ALKPHOS 73 04/03/2021 0756   AST 16 04/03/2021 0756   ALT 13 04/03/2021 0756   BILITOT 0.4 04/03/2021 0756       Impression and Plan: Justin Rosales is a very nice 70 year old white male.  He has ulcerative colitis.  He had Clostridium diarrhea.  He became profoundly iron deficient.    His hemoglobin is doing great.  I am very happy about thi.  The iron is truly helped.  The Retacrit is helped also.  Now, I think we can get him through October.  We will get him back in early November.  At that time, we will try to get him through all the holidays.  Know he will have a good time down in [place].  Again told to make sure he drinks a lot of water.   [name], MD 9/14/20228:36 AM

## 2021-04-04 ENCOUNTER — Inpatient Hospital Stay: Payer: Medicare Other

## 2021-04-04 ENCOUNTER — Inpatient Hospital Stay: Payer: Medicare Other | Admitting: Hematology & Oncology

## 2021-04-15 ENCOUNTER — Telehealth: Payer: Self-pay | Admitting: Family Medicine

## 2021-04-15 NOTE — Telephone Encounter (Signed)
Left message for patient to call back and schedule Medicare Annual Wellness Visit (AWV) in office.  ° °If not able to come in office, please offer to do virtually or by telephone.  Left office number and my jabber #[phone]. ° °Due for AWVI ° °Please schedule at anytime with Nurse Health Advisor. °  °

## 2021-04-24 ENCOUNTER — Other Ambulatory Visit (INDEPENDENT_AMBULATORY_CARE_PROVIDER_SITE_OTHER): Payer: Medicare Other

## 2021-04-24 DIAGNOSIS — K529 Noninfective gastroenteritis and colitis, unspecified: Secondary | ICD-10-CM

## 2021-04-24 DIAGNOSIS — Z862 Personal history of diseases of the blood and blood-forming organs and certain disorders involving the immune mechanism: Secondary | ICD-10-CM

## 2021-04-24 DIAGNOSIS — K5190 Ulcerative colitis, unspecified, without complications: Secondary | ICD-10-CM | POA: Diagnosis not present

## 2021-04-24 DIAGNOSIS — Z9225 Personal history of immunosupression therapy: Secondary | ICD-10-CM

## 2021-04-24 LAB — COMPREHENSIVE METABOLIC PANEL
ALT: 19 U/L (ref 0–53)
AST: 20 U/L (ref 0–37)
Albumin: 4.1 g/dL (ref 3.5–5.2)
Alkaline Phosphatase: 81 U/L (ref 39–117)
BUN: 13 mg/dL (ref 6–23)
CO2: 29 mEq/L (ref 19–32)
Calcium: 9.5 mg/dL (ref 8.4–10.5)
Chloride: 105 mEq/L (ref 96–112)
Creatinine, Ser: 0.84 mg/dL (ref 0.40–1.50)
GFR: 88.62 mL/min (ref >60.00)
Glucose, Bld: 102 mg/dL — ABNORMAL HIGH (ref 70–99)
Potassium: 4.5 mEq/L (ref 3.5–5.1)
Sodium: 140 mEq/L (ref 135–145)
Total Bilirubin: 0.5 mg/dL (ref 0.2–1.2)
Total Protein: 7.3 g/dL (ref 6.0–8.3)

## 2021-04-24 LAB — SEDIMENTATION RATE: Sed Rate: 31 mm/hr — ABNORMAL HIGH (ref 0–20)

## 2021-04-24 LAB — C-REACTIVE PROTEIN: CRP: <1 mg/dL (ref 0.5–20.0)

## 2021-04-30 ENCOUNTER — Other Ambulatory Visit: Payer: Self-pay

## 2021-04-30 ENCOUNTER — Non-Acute Institutional Stay (HOSPITAL_COMMUNITY)
Admission: RE | Admit: 2021-04-30 | Discharge: 2021-04-30 | Disposition: A | Discharge status: Home / Self Care | Payer: Medicare Other | Source: Ambulatory Visit | Attending: Internal Medicine | Admitting: Internal Medicine

## 2021-04-30 DIAGNOSIS — K529 Noninfective gastroenteritis and colitis, unspecified: Secondary | ICD-10-CM | POA: Insufficient documentation

## 2021-04-30 DIAGNOSIS — K51019 Ulcerative (chronic) pancolitis with unspecified complications: Secondary | ICD-10-CM | POA: Insufficient documentation

## 2021-04-30 LAB — COMPREHENSIVE METABOLIC PANEL
ALT: 24 U/L (ref 0–44)
AST: 21 U/L (ref 15–41)
Albumin: 3.9 g/dL (ref 3.5–5.0)
Alkaline Phosphatase: 87 U/L (ref 38–126)
Anion gap: 8 (ref 5–15)
BUN: 18 mg/dL (ref 8–23)
CO2: 24 mmol/L (ref 22–32)
Calcium: 9 mg/dL (ref 8.9–10.3)
Chloride: 106 mmol/L (ref 98–111)
Creatinine, Ser: 0.76 mg/dL (ref 0.61–1.24)
GFR, Estimated: >60 mL/min (ref >60)
Glucose, Bld: 102 mg/dL — ABNORMAL HIGH (ref 70–99)
Potassium: 3.8 mmol/L (ref 3.5–5.1)
Sodium: 138 mmol/L (ref 135–145)
Total Bilirubin: 0.8 mg/dL (ref 0.3–1.2)
Total Protein: 7.2 g/dL (ref 6.5–8.1)

## 2021-04-30 LAB — CBC WITH DIFFERENTIAL/PLATELET
Abs Immature Granulocytes: 0.01 10*3/uL (ref 0.00–0.07)
Basophils Absolute: 0 10*3/uL (ref 0.0–0.1)
Basophils Relative: 1 %
Eosinophils Absolute: 0.3 10*3/uL (ref 0.0–0.5)
Eosinophils Relative: 7 %
HCT: 38.2 % — ABNORMAL LOW (ref 39.0–52.0)
Hemoglobin: 12.2 g/dL — ABNORMAL LOW (ref 13.0–17.0)
Immature Granulocytes: 0 %
Lymphocytes Relative: 30 %
Lymphs Abs: 1.2 10*3/uL (ref 0.7–4.0)
MCH: 31.9 pg (ref 26.0–34.0)
MCHC: 31.9 g/dL (ref 30.0–36.0)
MCV: 100 fL (ref 80.0–100.0)
Monocytes Absolute: 0.6 10*3/uL (ref 0.1–1.0)
Monocytes Relative: 14 %
Neutro Abs: 2 10*3/uL (ref 1.7–7.7)
Neutrophils Relative %: 48 %
Platelets: 256 10*3/uL (ref 150–400)
RBC: 3.82 MIL/uL — ABNORMAL LOW (ref 4.22–5.81)
RDW: 15.1 % (ref 11.5–15.5)
WBC: 4.1 10*3/uL (ref 4.0–10.5)
nRBC: 0 % (ref 0.0–0.2)

## 2021-04-30 LAB — THIOPURINE METABOLITES
6 MMP(6-Methylmercaptopurine): 814 pmol/8x10(8)RBC (ref <5700)
6 TG(6-Thioguanine): 377 pmol/8x10(8)RBC (ref 235–400)

## 2021-04-30 LAB — SEDIMENTATION RATE: Sed Rate: 28 mm/hr — ABNORMAL HIGH (ref 0–16)

## 2021-04-30 MED ORDER — SODIUM CHLORIDE 0.9 % IV SOLN
INTRAVENOUS | Status: DC | PRN
Start: 2021-04-30 — End: 2021-05-01
  Administered 2021-04-30: 250 mL via INTRAVENOUS

## 2021-04-30 MED ORDER — SODIUM CHLORIDE 0.9 % IV SOLN
10.0000 mg/kg | INTRAVENOUS | Status: DC
  Administered 2021-04-30: 900 mg via INTRAVENOUS
  Filled 2021-04-30: qty 90

## 2021-04-30 NOTE — Progress Notes (Signed)
PATIENT CARE CENTER NOTE   Diagnosis: Ulcerative Colitis     Provider: Justice Britain MD     Procedure: Remicade infusion 65m/kg + lab draw     Note: Patient received Remicade infusion (dose 5 of 6) via PIV. Labs drawn per orders (CBC, CMP, Sedimentation rate, High sensitivity CRP). Pre meds not required per orders.  Infusion titrated per protocol. Patient tolerated well with no adverse reaction. Vital signs stable. Pt declined AVS.  Alert, oriented and ambulatory at discharge.

## 2021-05-01 ENCOUNTER — Other Ambulatory Visit: Payer: Self-pay | Admitting: Gastroenterology

## 2021-05-01 ENCOUNTER — Encounter: Payer: Medicare Other | Admitting: Family Medicine

## 2021-05-01 ENCOUNTER — Other Ambulatory Visit: Payer: Self-pay

## 2021-05-01 DIAGNOSIS — K529 Noninfective gastroenteritis and colitis, unspecified: Secondary | ICD-10-CM

## 2021-05-01 DIAGNOSIS — K5190 Ulcerative colitis, unspecified, without complications: Secondary | ICD-10-CM

## 2021-05-01 LAB — HIGH SENSITIVITY CRP: CRP, High Sensitivity: 2.91 mg/L (ref 0.00–3.00)

## 2021-05-01 LAB — SERIAL MONITORING

## 2021-05-02 LAB — INFLIXIMAB+AB (SERIAL MONITOR)
Anti-Infliximab Antibody: <22 ng/mL
Infliximab Drug Level: 33 ug/mL

## 2021-05-02 LAB — CALPROTECTIN, FECAL: Calprotectin, Fecal: 373 ug/g — ABNORMAL HIGH (ref 0–120)

## 2021-05-08 ENCOUNTER — Encounter: Payer: Self-pay | Admitting: Hematology & Oncology

## 2021-05-08 ENCOUNTER — Other Ambulatory Visit: Payer: Self-pay

## 2021-05-09 ENCOUNTER — Encounter: Payer: Self-pay | Admitting: Family Medicine

## 2021-05-09 ENCOUNTER — Ambulatory Visit (INDEPENDENT_AMBULATORY_CARE_PROVIDER_SITE_OTHER): Payer: Medicare Other | Admitting: Family Medicine

## 2021-05-09 VITALS — BP 132/68 | HR 103 | Temp 97.6°F | Resp 18 | Ht 71.0 in | Wt 211.8 lb

## 2021-05-09 DIAGNOSIS — Z0000 Encounter for general adult medical examination without abnormal findings: Secondary | ICD-10-CM

## 2021-05-09 DIAGNOSIS — N529 Male erectile dysfunction, unspecified: Secondary | ICD-10-CM | POA: Diagnosis not present

## 2021-05-09 DIAGNOSIS — Z136 Encounter for screening for cardiovascular disorders: Secondary | ICD-10-CM | POA: Diagnosis not present

## 2021-05-09 DIAGNOSIS — I700 Atherosclerosis of aorta: Secondary | ICD-10-CM | POA: Diagnosis not present

## 2021-05-09 DIAGNOSIS — Z23 Encounter for immunization: Secondary | ICD-10-CM

## 2021-05-09 LAB — LIPID PANEL
Cholesterol: 171 mg/dL (ref 0–200)
HDL: 53.5 mg/dL (ref >39.00)
LDL Cholesterol: 98 mg/dL (ref 0–99)
NonHDL: 117.9
Total CHOL/HDL Ratio: 3
Triglycerides: 102 mg/dL (ref 0.0–149.0)
VLDL: 20.4 mg/dL (ref 0.0–40.0)

## 2021-05-09 MED ORDER — ROSUVASTATIN CALCIUM 20 MG PO TABS: 20.0000 mg | ORAL_TABLET | Freq: Every day | ORAL | 3 refills | Status: DC

## 2021-05-09 MED ORDER — SILDENAFIL CITRATE 100 MG PO TABS: 50.0000 mg | ORAL_TABLET | Freq: Every day | ORAL | 2 refills | Status: DC | PRN

## 2021-05-09 NOTE — Progress Notes (Signed)
Chief Complaint  Patient presents with   Annual Exam    Pt states fasting     Well Male Justin Rosales is here for a complete physical.   His last physical was >1 year ago.  Current diet: in general, a "healthy" diet.   Current exercise: walking Weight trend: stable Fatigue out of ordinary? No. Seat belt? Yes.    Health maintenance Shingrix- No Colonoscopy- Yes Tetanus- Yes Hep C- Yes Pneumonia vaccine- Due for PCV20  ED The patient is able to attain an erection but is not able to maintain 1 satisfactory for penetration.  This has been going on the past several months.  He has never tried a medication but is very interested in doing so.  He does not have any waning interest in intercourse.  Past Medical History:  Diagnosis Date   Anemia    Arthritis    C. 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 Dr Dorrene German possibly With [facility] x2. last one done around 2012     Medications  Current Outpatient Medications on File Prior to Visit  Medication Sig Dispense Refill   Bepotastine Besilate 1.5 % SOLN 1 drop 2 (two) times daily.     Cholecalciferol (DIALYVITE VITAMIN D 5000) 125 MCG (5000 UT) capsule Take 5,000 Units by mouth daily.     diphenhydrAMINE HCl, Sleep, (ZZZQUIL PO) Take 10 mLs by mouth daily as needed (sleep).     folic acid (FOLVITE) 1 MG tablet Take 2 tablets (2 mg total) by mouth daily. 60 tablet 12   inFLIXimab in sodium chloride 0.9 % Inject into the vein. 01/16/2021 To start taking  every 4 weeks.     mercaptopurine (PURINETHOL) 50 MG tablet TAKE 1 AND 1/2 TABLETS BY  MOUTH DAILY 180 tablet 3   Multiple Vitamin (MULTIVITAMIN WITH MINERALS) TABS tablet Take 1 tablet by mouth daily.     NON FORMULARY Take 1 tablet by mouth daily. Vital reds-energy supplement     PEG-KCl-NaCl-NaSulf-Na Asc-C (PLENVU) 140 g SOLR Take 1 kit by mouth as directed. Use coupon: BIN: [id number] PNC: CNRX Group: [id number] ID: [id number] 1 each 0   Saccharomyces boulardii (FLORASTOR PO) Take 1 capsule by mouth in the morning and at bedtime.     Allergies No Known Allergies  Family History Family History  Problem Relation Age of Onset   Hypertension Mother    Diabetes Mother    Alzheimer's disease Mother    Parkinson's disease Father    Alzheimer's disease Father    Colon cancer Neg Hx    Esophageal cancer Neg Hx    Rectal cancer Neg Hx    Stomach cancer Neg Hx     Review of Systems: Constitutional:  no fevers Eye:  no recent significant change in vision Ears:  No changes in hearing Nose/Mouth/Throat:  no complaints of nasal congestion, no sore throat Cardiovascular: no chest pain Respiratory:  No shortness of breath Gastrointestinal:  No change in bowel habits GU:  +  ED Integumentary:  no abnormal skin lesions reported Neurologic:  no headaches Endocrine:  denies unexplained weight changes  Exam BP 132/68 (BP Location: Left Arm, Patient Position: Sitting, Cuff Size: Large)   Pulse (!) 103   Temp 97.6 F (36.4 C) (Oral)   Resp 18   Ht _0  (1.803 m)   Wt 211 lb 12.8 oz (96.1 kg)   SpO2 99%   BMI 29.54 kg/m  General:  well developed, well nourished, in no apparent distress Skin:  no significant moles, warts, or growths Head:  no masses, lesions, or tenderness Eyes:  pupils equal and round, sclera anicteric without injection Ears:  canals without lesions, TMs shiny without retraction, no obvious effusion, no erythema Nose:  nares patent, septum midline, mucosa  normal Throat/Pharynx:  lips and gingiva without lesion; tongue and uvula midline; non-inflamed pharynx; no exudates or postnasal drainage Lungs:  clear to auscultation, breath sounds equal bilaterally, no respiratory distress Cardio:  regular rate and rhythm, no LE edema or bruits Rectal: Deferred GI: BS+, S, NT, ND, no masses or organomegaly Musculoskeletal:  symmetrical muscle groups noted without atrophy or deformity Neuro:  gait normal; deep tendon reflexes normal and symmetric Psych: well oriented with normal range of affect and appropriate judgment/insight  Assessment and Plan  Well adult exam - Plan: Lipid panel  Need for influenza vaccination - Plan: Flu Vaccine QUAD High Dose(Fluad)  Screening for AAA (abdominal aortic aneurysm) - Plan: US AORTA MEDICARE SCREENING  Erectile dysfunction, unspecified erectile dysfunction type  Aortic atherosclerosis (HCC) - Plan: rosuvastatin (CRESTOR) 20 MG tablet, Hepatic function panel, Lipid panel   Well 70 y.o. male. Counseled on diet and exercise. Other orders as above. ED- trial Viagra.  Recommended goodRx for this. CT chest reviewed from 01/17/2021.  Aortic atherosclerosis noted.  We will start Crestor 20 mg daily.  Check cholesterol today and again in [redacted] weeks along with hepatic function panel. PCV20 today. Rec'd covid bivalent booster. Shingrix also rec'd.  Follow up in 6 months or prn.  The patient voiced understanding and agreement to the plan.  Ginger Blue, DO 05/09/21 10:38 AM

## 2021-05-09 NOTE — Patient Instructions (Addendum)
Give [place] 2-3 business days to get the results of your labs back.   Keep the diet clean and stay active.  Use GoodRx for the sildenafil.   I recommend getting the updated bivalent covid vaccination booster at your convenience.   The new Shingrix vaccine (for shingles) is a 2 shot series. It can make people feel low energy, achy and almost like they have the flu for 48 hours after injection. Please plan accordingly when deciding on when to get this shot. Call our office for a nurse visit appointment to get this. The second shot of the series is less severe regarding the side effects, but it still lasts 48 hours.   Let us know if you need anything.

## 2021-05-09 NOTE — Addendum Note (Signed)
Addended by: Sanda Linger on: 05/09/2021 10:48 AM   Modules accepted: Orders

## 2021-05-15 ENCOUNTER — Ambulatory Visit (HOSPITAL_BASED_OUTPATIENT_CLINIC_OR_DEPARTMENT_OTHER)
Admission: RE | Admit: 2021-05-15 | Discharge: 2021-05-15 | Disposition: A | Discharge status: Home / Self Care | Payer: Medicare Other | Source: Ambulatory Visit | Attending: Family Medicine | Admitting: Family Medicine

## 2021-05-15 ENCOUNTER — Other Ambulatory Visit: Payer: Self-pay

## 2021-05-15 DIAGNOSIS — Z87891 Personal history of nicotine dependence: Secondary | ICD-10-CM | POA: Diagnosis not present

## 2021-05-15 DIAGNOSIS — Z136 Encounter for screening for cardiovascular disorders: Secondary | ICD-10-CM | POA: Insufficient documentation

## 2021-05-16 ENCOUNTER — Other Ambulatory Visit: Payer: Self-pay | Admitting: Family Medicine

## 2021-05-16 DIAGNOSIS — I700 Atherosclerosis of aorta: Secondary | ICD-10-CM

## 2021-05-16 MED ORDER — ASPIRIN 81 MG PO TBEC: 81.0000 mg | DELAYED_RELEASE_TABLET | Freq: Every day | ORAL | 12 refills | Status: DC

## 2021-05-22 ENCOUNTER — Encounter: Payer: Self-pay | Admitting: Gastroenterology

## 2021-05-22 ENCOUNTER — Ambulatory Visit (AMBULATORY_SURGERY_CENTER): Payer: Medicare Other | Admitting: Gastroenterology

## 2021-05-22 ENCOUNTER — Other Ambulatory Visit: Payer: Self-pay

## 2021-05-22 VITALS — BP 133/69 | HR 86 | Temp 96.8°F | Resp 10 | Ht 71.0 in | Wt 207.0 lb

## 2021-05-22 DIAGNOSIS — K5190 Ulcerative colitis, unspecified, without complications: Secondary | ICD-10-CM | POA: Diagnosis not present

## 2021-05-22 DIAGNOSIS — D122 Benign neoplasm of ascending colon: Secondary | ICD-10-CM | POA: Diagnosis not present

## 2021-05-22 DIAGNOSIS — K633 Ulcer of intestine: Secondary | ICD-10-CM

## 2021-05-22 DIAGNOSIS — Z86010 Personal history of colonic polyps: Secondary | ICD-10-CM

## 2021-05-22 DIAGNOSIS — K5989 Other specified functional intestinal disorders: Secondary | ICD-10-CM | POA: Diagnosis not present

## 2021-05-22 DIAGNOSIS — K529 Noninfective gastroenteritis and colitis, unspecified: Secondary | ICD-10-CM

## 2021-05-22 MED ORDER — SODIUM CHLORIDE 0.9 % IV SOLN: 500.0000 mL | Freq: Once | INTRAVENOUS | Status: DC

## 2021-05-22 NOTE — Progress Notes (Signed)
GASTROENTEROLOGY PROCEDURE H&P NOTE   Primary Care Physician: Shelda Pal, DO  HPI: Justin Rosales is a 70 y.o. male who presents for Colonoscopy for IBD surveillance.  Past Medical History:  Diagnosis Date   Allergy    Anemia    Arthritis    Blood transfusion without reported diagnosis    C. 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 Dr Dorrene German possibly With [facility] x2. last one done around 2012    Current Outpatient Medications  Medication Sig Dispense Refill   aspirin 81 MG EC tablet Take 1 tablet (81 mg total) by mouth daily. Swallow whole. 30 tablet 12   Cholecalciferol (DIALYVITE VITAMIN D 5000) 125 MCG (5000 UT) capsule Take 5,000 Units by mouth daily.     diphenhydrAMINE HCl, Sleep, (ZZZQUIL PO) Take 10 mLs by mouth daily as needed (sleep).     folic acid (FOLVITE) 1 MG tablet Take 2 tablets (2 mg total) by mouth daily. 60 tablet 12   mercaptopurine (PURINETHOL) 50 MG tablet TAKE 1 AND 1/2 TABLETS BY  MOUTH DAILY 180 tablet 3   Multiple Vitamin (MULTIVITAMIN WITH MINERALS) TABS tablet Take 1 tablet by mouth daily.     NON FORMULARY Take 1 tablet by mouth daily. Vital reds-energy supplement     rosuvastatin (CRESTOR) 20 MG tablet Take 1 tablet (20 mg total) by mouth daily. 90 tablet 3   Saccharomyces boulardii (FLORASTOR PO) Take 1 capsule by mouth in the morning and at bedtime.     Bepotastine Besilate 1.5 % SOLN 1 drop 2 (two) times daily.     inFLIXimab in sodium chloride  0.9 % Inject into the vein. 01/16/2021 To start taking every 4 weeks.     sildenafil (VIAGRA) 100 MG tablet Take 0.5-1 tablets (50-100 mg total) by mouth daily as needed for erectile dysfunction. 30 tablet 2   Current Facility-Administered Medications  Medication Dose Route Frequency Provider Last Rate Last Admin   0.9 %  sodium chloride infusion  500 mL Intravenous Once Mansouraty, Telford Nab., MD        Current Outpatient Medications:    aspirin 81 MG EC tablet, Take 1 tablet (81 mg total) by mouth daily. Swallow whole., Disp: 30 tablet, Rfl: 12   Cholecalciferol (DIALYVITE VITAMIN D 5000) 125 MCG (5000 UT) capsule, Take 5,000 Units by mouth daily., Disp: , Rfl:    diphenhydrAMINE HCl, Sleep, (ZZZQUIL PO), Take 10 mLs by mouth daily as needed (sleep)., Disp: , Rfl:    folic acid (FOLVITE) 1 MG tablet, Take 2 tablets (2 mg total) by mouth daily., Disp: 60 tablet, Rfl: 12   mercaptopurine (PURINETHOL) 50 MG tablet, TAKE 1 AND 1/2 TABLETS BY  MOUTH DAILY, Disp: 180 tablet, Rfl: 3   Multiple Vitamin (MULTIVITAMIN WITH MINERALS) TABS tablet, Take 1 tablet by mouth daily., Disp: , Rfl:    NON FORMULARY, Take 1 tablet by mouth daily. Vital reds-energy supplement, Disp: , Rfl:    rosuvastatin (CRESTOR) 20 MG  tablet, Take 1 tablet (20 mg total) by mouth daily., Disp: 90 tablet, Rfl: 3   Saccharomyces boulardii (FLORASTOR PO), Take 1 capsule by mouth in the morning and at bedtime., Disp: , Rfl:    Bepotastine Besilate 1.5 % SOLN, 1 drop 2 (two) times daily., Disp: , Rfl:    inFLIXimab in sodium chloride 0.9 %, Inject into the vein. 01/16/2021 To start taking every 4 weeks., Disp: , Rfl:    sildenafil (VIAGRA) 100 MG tablet, Take 0.5-1 tablets (50-100 mg total) by mouth daily as needed for erectile dysfunction., Disp: 30 tablet, Rfl: 2  Current Facility-Administered Medications:    0.9 %  sodium chloride infusion, 500 mL, Intravenous, Once, Mansouraty, Telford Nab., MD No Known Allergies Family History   Problem Relation Age of Onset   Hypertension Mother    Diabetes Mother    Alzheimer's disease Mother    Parkinson's disease Father    Alzheimer's disease Father    Colon cancer Neg Hx    Esophageal cancer Neg Hx    Rectal cancer Neg Hx    Stomach cancer Neg Hx    Social History   Socioeconomic History   Marital status: Married    Spouse name: Judeen Hammans   Number of children: 2   Years of education: Not on file   Highest education level: Not on file  Occupational History   Occupation: Emergency planning/management officer   Occupation: retired  Tobacco Use   Smoking status: Former    Packs/day: 1.00    Years: 30.00    Pack years: 30.00    Types: Cigarettes    Quit date: 2001    Years since quitting: 21.8   Smokeless tobacco: Never  Vaping Use   Vaping Use: Never used  Substance and Sexual Activity   Alcohol use: Yes    Comment: ocassionally   Drug use: Not Currently   Sexual activity: Not on file  Other Topics Concern   Not on file  Social History Narrative   Not on file   Social Determinants of Health   Financial Resource Strain: Not on file  Food Insecurity: Not on file  Transportation Needs: Not on file  Physical Activity: Not on file  Stress: Not on file  Social Connections: Not on file  Intimate Partner Violence: Not on file    Physical Exam: Today's Vitals   05/22/21 0809  BP: (!) 123/59  Pulse: 100  Temp: (!) 96.8 F (36 C)  TempSrc: Temporal  SpO2: 97%  Weight: 207 lb (93.9 kg)  Height: 5' 11"  (1.803 m)   Body mass index is 28.87 kg/m. GEN: NAD EYE: Sclerae anicteric ENT: MMM CV: Non-tachycardic GI: Soft, NT/ND NEURO:  Alert & Oriented x 3  Lab Results: No results for input(s): WBC, HGB, HCT, PLT in the last 72 hours. BMET No results for input(s): NA, K, CL, CO2, GLUCOSE, BUN, CREATININE, CALCIUM in the last 72 hours. LFT No results for input(s): PROT, ALBUMIN, AST, ALT, ALKPHOS, BILITOT, BILIDIR, IBILI in the last 72 hours. PT/INR No results for  input(s): LABPROT, INR in the last 72 hours.   Impression / Plan: This is a 70 y.o.male who presents for Colonoscopy for IBD surveillance.  The risks and benefits of endoscopic evaluation/treatment were discussed with the patient and/or family; these include but are not limited to the risk of perforation, infection, bleeding, missed lesions, lack of diagnosis, severe illness requiring hospitalization, as well as anesthesia and sedation related illnesses.  The patient's history has been reviewed,  patient examined, no change in status, and deemed stable for procedure.  The patient and/or family is agreeable to proceed.    Justice Britain, MD [facility] Office # [phone]

## 2021-05-22 NOTE — Patient Instructions (Signed)
Discharge instructions given. Handout on polyps. Handouts on polyps,diverticulosis,hemorrhoids and a high fiber diet. Resume previous medications. YOU HAD AN ENDOSCOPIC PROCEDURE TODAY AT [facility]:   Refer to the procedure report that was given to you for any specific questions about what was found during the examination.  If the procedure report does not answer your questions, please call your gastroenterologist to clarify.  If you requested that your care partner not be given the details of your procedure findings, then the procedure report has been included in a sealed envelope for you to review at your convenience later.  YOU SHOULD EXPECT: Some feelings of bloating in the abdomen. Passage of more gas than usual.  Walking can help get rid of the air that was put into your GI tract during the procedure and reduce the bloating. If you had a lower endoscopy (such as a colonoscopy or flexible sigmoidoscopy) you may notice spotting of blood in your stool or on the toilet paper. If you underwent a bowel prep for your procedure, you may not have a normal bowel movement for a few days.  Please Note:  You might notice some irritation and congestion in your nose or some drainage.  This is from the oxygen used during your procedure.  There is no need for concern and it should clear up in a day or so.  SYMPTOMS TO REPORT IMMEDIATELY:  Following lower endoscopy (colonoscopy or flexible sigmoidoscopy):  Excessive amounts of blood in the stool  Significant tenderness or worsening of abdominal pains  Swelling of the abdomen that is new, acute  Fever of 100F or higher   For urgent or emergent issues, a gastroenterologist can be reached at any hour by calling [phone]. Do not use MyChart messaging for urgent concerns.    DIET:  We do recommend a small meal at first, but then you may proceed to your regular diet.  Drink plenty of fluids but you should avoid alcoholic beverages for  24 hours.  ACTIVITY:  You should plan to take it easy for the rest of today and you should NOT DRIVE or use heavy machinery until tomorrow (because of the sedation medicines used during the test).    FOLLOW UP: Our staff will call the number listed on your records 48-72 hours following your procedure to check on you and address any questions or concerns that you may have regarding the information given to you following your procedure. If we do not reach you, we will leave a message.  We will attempt to reach you two times.  During this call, we will ask if you have developed any symptoms of COVID 19. If you develop any symptoms (ie: fever, flu-like symptoms, shortness of breath, cough etc.) before then, please call [phone].  If you test positive for Covid 19 in the 2 weeks post procedure, please call and report this information to [place].    If any biopsies were taken you will be contacted by phone or by letter within the next 1-3 weeks.  Please call us at [phone] if you have not heard about the biopsies in 3 weeks.    SIGNATURES/CONFIDENTIALITY: You and/or your care partner have signed paperwork which will be entered into your electronic medical record.  These signatures attest to the fact that that the information above on your After Visit Summary has been reviewed and is understood.  Full responsibility of the confidentiality of this discharge information lies with you and/or your care-partner.

## 2021-05-22 NOTE — Progress Notes (Signed)
Called to room to assist during endoscopic procedure.  Patient ID and intended procedure confirmed with present staff. Received instructions for my participation in the procedure from the performing physician.  

## 2021-05-22 NOTE — Progress Notes (Signed)
To PACU, VSS. Report to [email or web address] 

## 2021-05-22 NOTE — Op Note (Addendum)
[facility] Patient Name: Justin Rosales Procedure Date: 05/22/2021 9:18 AM MRN: [id number] Endoscopist: Justice Britain , MD Age: 70 Referring MD:  Date of Birth: Sep 18, 1950 Gender: Male Account #: [id number] Procedure:                Colonoscopy Indications:              High risk colon cancer surveillance: Ulcerative                            left sided colitis Medicines:                Monitored Anesthesia Care Procedure:                Pre-Anesthesia Assessment:                           - Prior to the procedure, a History and Physical                            was performed, and patient medications and                            allergies were reviewed. The patient's tolerance of                            previous anesthesia was also reviewed. The risks                            and benefits of the procedure and the sedation                            options and risks were discussed with the patient.                            All questions were answered, and informed consent                            was obtained. Prior Anticoagulants: The patient has                            taken no previous anticoagulant or antiplatelet                            agents except for aspirin. ASA Grade Assessment: II                            - A patient with mild systemic disease. After                            reviewing the risks and benefits, the patient was                            deemed in satisfactory condition to undergo the  procedure.                           After obtaining informed consent, the colonoscope                            was passed under direct vision. Throughout the                            procedure, the patient's blood pressure, pulse, and                            oxygen saturations were monitored continuously. The                            CF [id number] #[id number] was introduced through the anus                             and advanced to the 5 cm into the ileum. The                            colonoscopy was performed without difficulty. The                            patient tolerated the procedure. The quality of the                            bowel preparation was adequate. The terminal ileum,                            ileocecal valve, appendiceal orifice, and rectum                            were photographed. Scope In: 8:50:38 AM Scope Out: 9:13:34 AM Scope Withdrawal Time: 0 hours 20 minutes 9 seconds  Total Procedure Duration: 0 hours 22 minutes 56 seconds  Findings:                 The digital rectal exam findings include                            hemorrhoids. Pertinent negatives include no                            palpable rectal lesions.                           The terminal ileum and ileocecal valve appeared                            normal.                           A few small-mouthed diverticula were found in the  ascending colon.                           A 3 mm polyp was found in the ascending colon. The                            polyp was sessile. The polyp was removed with a                            cold snare. Resection and retrieval were complete.                           Normal mucosa was found in the proximal transverse                            colon, at the hepatic flexure, in the ascending                            colon and in the cecum. Biopsies were taken with a                            cold forceps for histology.                           A medium scar was found at the hepatic flexure. The                            scar tissue was healthy in appearance.                           A single (solitary) ulcer was found in the mid                            transverse colon and in the distal transverse                            colon. No bleeding was present. Biopsies were taken                            with a cold forceps for  histology. Area was                            tattooed with an injection of Spot (carbon black)                            for demarcation purposes.                           Diffuse moderate mucosal changes characterized by                            congestion (edema), erythema and granularity were  found in the mid transverse colon and in the distal                            transverse colon.                           Inflammation characterized by altered vascularity,                            congestion (edema), erythema, friability,                            granularity and pseudopolyps was found in a                            continuous and circumferential pattern from the                            sigmoid colon to the splenic flexure. This was                            moderate in severity and severe, and when compared                            to previous examinations, the findings are                            improved. Biopsies were taken with a cold forceps                            for histology.                           Normal mucosa was found in the rectum. Biopsies                            were taken with a cold forceps for histology.                           Non-bleeding non-thrombosed external and internal                            hemorrhoids were found during retroflexion, during                            perianal exam and during digital exam. The                            hemorrhoids were Grade II (internal hemorrhoids                            that prolapse but reduce spontaneously). Complications:            No immediate complications. Estimated Blood Loss:     Estimated blood loss was minimal. Impression:               -  Hemorrhoids found on digital rectal exam.                           - The examined portion of the ileum was normal.                           - Diverticulosis in the ascending colon.                            - One 3 mm polyp in the ascending colon, removed                            with a cold snare. Resected and retrieved.                           - Normal mucosa in the proximal transverse colon,                            at the hepatic flexure, in the ascending colon and                            in the cecum. Biopsied.                           - Scar at the hepatic flexure.                           - A single (solitary) ulcer in the mid transverse                            colon and in the distal transverse colon. Biopsied.                            Tattooed.                           - Diffuse moderate mucosal changes were found in                            the mid transverse colon and in the distal                            transverse colon secondary to colitis.                           - Colitis. Inflammation was found from the sigmoid                            colon to the splenic flexure. This was moderate in                            severity and severe, improved compared to previous  examinations. Biopsied.                           - Normal mucosa in the rectum. Biopsied.                           - Non-bleeding non-thrombosed external and internal                            hemorrhoids. Recommendation:           - The patient will be observed post-procedure,                            until all discharge criteria are met.                           - Discharge patient to home.                           - Patient has a contact number available for                            emergencies. The signs and symptoms of potential                            delayed complications were discussed with the                            patient. Return to normal activities tomorrow.                            Written discharge instructions were provided to the                            patient.                           - High fiber diet.                            - Await pathology results.                           - Continue present medications. Will discuss based                            on pathology findings next steps in regards to                            treatment.                           - Repeat colonoscopy in 1 year for surveillance                            based on pathology results.                           -  The findings and recommendations were discussed                            with the patient.                           - The findings and recommendations were discussed                            with the patient's family. Justice Britain, MD 05/22/2021 9:39:15 AM

## 2021-05-22 NOTE — Progress Notes (Signed)
VS-DT

## 2021-05-24 ENCOUNTER — Telehealth: Payer: Self-pay

## 2021-05-24 NOTE — Telephone Encounter (Signed)
  Follow up Call-  Call back number 05/22/2021 07/26/2020 07/18/2019  Post procedure Call Back phone  # [phone]  Permission to leave phone message Yes Yes Yes  Some recent data might be hidden     Patient questions:  Do you have a fever, pain , or abdominal swelling? No. Pain Score  0 *  Have you tolerated food without any problems? Yes.    Have you been able to return to your normal activities? Yes.    Do you have any questions about your discharge instructions: Diet   No. Medications  No. Follow up visit  No.  Do you have questions or concerns about your Care? No.  Actions: * If pain score is 4 or above: No action needed, pain <4.  Have you developed a fever since your procedure? no  2.   Have you had an respiratory symptoms (SOB or cough) since your procedure? no  3.   Have you tested positive for COVID 19 since your procedure no  4.   Have you had any family members/close contacts diagnosed with the COVID 19 since your procedure?  no   If yes to any of these questions please route to Joylene John, RN and Joella Prince, RN

## 2021-05-27 ENCOUNTER — Encounter: Payer: Self-pay | Admitting: Gastroenterology

## 2021-05-28 ENCOUNTER — Other Ambulatory Visit: Payer: Self-pay

## 2021-05-28 ENCOUNTER — Non-Acute Institutional Stay (HOSPITAL_COMMUNITY)
Admission: RE | Admit: 2021-05-28 | Discharge: 2021-05-28 | Disposition: A | Discharge status: Home / Self Care | Payer: Medicare Other | Source: Ambulatory Visit | Attending: Internal Medicine | Admitting: Internal Medicine

## 2021-05-28 DIAGNOSIS — K5190 Ulcerative colitis, unspecified, without complications: Secondary | ICD-10-CM | POA: Insufficient documentation

## 2021-05-28 DIAGNOSIS — K529 Noninfective gastroenteritis and colitis, unspecified: Secondary | ICD-10-CM

## 2021-05-28 MED ORDER — SODIUM CHLORIDE 0.9 % IV SOLN
10.0000 mg/kg | INTRAVENOUS | Status: DC
  Administered 2021-05-28: 900 mg via INTRAVENOUS
  Filled 2021-05-28: qty 90

## 2021-05-28 MED ORDER — SODIUM CHLORIDE 0.9 % IV SOLN: INTRAVENOUS | Status: DC | PRN

## 2021-05-28 NOTE — Progress Notes (Signed)
PATIENT CARE CENTER NOTE   Diagnosis: Ulcerative Colitis     Provider: Justice Britain MD     Procedure: Remicade infusion 83m/kg + lab draw     Note: Patient received Remicade infusion (dose 6 of 6) via PIV. Pre meds not required per orders.  Infusion titrated per protocol. Patient tolerated well with no adverse reaction. Vital signs stable. Pt declined AVS.  Patient to come back monthly for Remicade infusions and get labs drawn every two months. Alert, oriented and ambulatory at discharge.

## 2021-05-29 ENCOUNTER — Inpatient Hospital Stay (HOSPITAL_BASED_OUTPATIENT_CLINIC_OR_DEPARTMENT_OTHER): Payer: Medicare Other | Admitting: Hematology & Oncology

## 2021-05-29 ENCOUNTER — Inpatient Hospital Stay: Payer: Medicare Other | Attending: Hematology & Oncology

## 2021-05-29 ENCOUNTER — Inpatient Hospital Stay: Payer: Medicare Other

## 2021-05-29 ENCOUNTER — Telehealth: Payer: Self-pay

## 2021-05-29 VITALS — BP 153/64 | HR 90 | Temp 97.9°F | Resp 20 | Wt 209.8 lb

## 2021-05-29 DIAGNOSIS — K51911 Ulcerative colitis, unspecified with rectal bleeding: Secondary | ICD-10-CM | POA: Diagnosis not present

## 2021-05-29 DIAGNOSIS — K909 Intestinal malabsorption, unspecified: Secondary | ICD-10-CM | POA: Diagnosis not present

## 2021-05-29 DIAGNOSIS — Z7901 Long term (current) use of anticoagulants: Secondary | ICD-10-CM | POA: Diagnosis not present

## 2021-05-29 DIAGNOSIS — D631 Anemia in chronic kidney disease: Secondary | ICD-10-CM | POA: Insufficient documentation

## 2021-05-29 DIAGNOSIS — I82431 Acute embolism and thrombosis of right popliteal vein: Secondary | ICD-10-CM | POA: Insufficient documentation

## 2021-05-29 DIAGNOSIS — N189 Chronic kidney disease, unspecified: Secondary | ICD-10-CM | POA: Diagnosis not present

## 2021-05-29 DIAGNOSIS — D500 Iron deficiency anemia secondary to blood loss (chronic): Secondary | ICD-10-CM

## 2021-05-29 LAB — CMP (CANCER CENTER ONLY)
ALT: 17 U/L (ref 0–44)
AST: 20 U/L (ref 15–41)
Albumin: 3.9 g/dL (ref 3.5–5.0)
Alkaline Phosphatase: 81 U/L (ref 38–126)
Anion gap: 8 (ref 5–15)
BUN: 15 mg/dL (ref 8–23)
CO2: 28 mmol/L (ref 22–32)
Calcium: 9.6 mg/dL (ref 8.9–10.3)
Chloride: 103 mmol/L (ref 98–111)
Creatinine: 1 mg/dL (ref 0.61–1.24)
GFR, Estimated: >60 mL/min (ref >60)
Glucose, Bld: 106 mg/dL — ABNORMAL HIGH (ref 70–99)
Potassium: 4.3 mmol/L (ref 3.5–5.1)
Sodium: 139 mmol/L (ref 135–145)
Total Bilirubin: 0.4 mg/dL (ref 0.3–1.2)
Total Protein: 7.2 g/dL (ref 6.5–8.1)

## 2021-05-29 LAB — CBC WITH DIFFERENTIAL (CANCER CENTER ONLY)
Abs Immature Granulocytes: 0.03 K/uL (ref 0.00–0.07)
Basophils Absolute: 0 K/uL (ref 0.0–0.1)
Basophils Relative: 1 %
Eosinophils Absolute: 0.4 K/uL (ref 0.0–0.5)
Eosinophils Relative: 7 %
HCT: 34 % — ABNORMAL LOW (ref 39.0–52.0)
Hemoglobin: 11.2 g/dL — ABNORMAL LOW (ref 13.0–17.0)
Immature Granulocytes: 1 %
Lymphocytes Relative: 33 %
Lymphs Abs: 1.7 K/uL (ref 0.7–4.0)
MCH: 31 pg (ref 26.0–34.0)
MCHC: 32.9 g/dL (ref 30.0–36.0)
MCV: 94.2 fL (ref 80.0–100.0)
Monocytes Absolute: 0.8 K/uL (ref 0.1–1.0)
Monocytes Relative: 16 %
Neutro Abs: 2.1 K/uL (ref 1.7–7.7)
Neutrophils Relative %: 42 %
Platelet Count: 265 K/uL (ref 150–400)
RBC: 3.61 MIL/uL — ABNORMAL LOW (ref 4.22–5.81)
RDW: 14.2 % (ref 11.5–15.5)
WBC Count: 5 K/uL (ref 4.0–10.5)
nRBC: 0 % (ref 0.0–0.2)

## 2021-05-29 LAB — RETICULOCYTES
Immature Retic Fract: 18.8 % — ABNORMAL HIGH (ref 2.3–15.9)
RBC.: 3.69 MIL/uL — ABNORMAL LOW (ref 4.22–5.81)
Retic Count, Absolute: 71.2 10*3/uL (ref 19.0–186.0)
Retic Ct Pct: 1.9 % (ref 0.4–3.1)

## 2021-05-29 LAB — FERRITIN: Ferritin: 52 ng/mL (ref 24–336)

## 2021-05-29 LAB — IRON AND TIBC
Iron: 37 ug/dL — ABNORMAL LOW (ref 42–163)
Saturation Ratios: 12 % — ABNORMAL LOW (ref 20–55)
TIBC: 295 ug/dL (ref 202–409)
UIBC: 259 ug/dL (ref 117–376)

## 2021-05-29 NOTE — Telephone Encounter (Signed)
Called pt and informed him of lab results and scheduling will call once insurance approves iron. He verbalized understanding and denies any questions or concerns
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
## 2021-05-29 NOTE — Telephone Encounter (Signed)
-----   Message from [name], MD sent at 05/29/2021 10:19 AM EST ----- Call - the iron is low. He needs a dose of IV Iron,  please make sure pharmacy orders Monoferric!!!    [name]

## 2021-06-11 ENCOUNTER — Other Ambulatory Visit: Payer: Self-pay | Admitting: Family

## 2021-06-17 ENCOUNTER — Telehealth: Payer: Self-pay | Admitting: *Deleted

## 2021-06-17 NOTE — Telephone Encounter (Signed)
Per scheduling message - Alyson Ingles - called and gave upcoming appointment (1) 1 dose of Monoferric IV Iron

## 2021-06-20 ENCOUNTER — Inpatient Hospital Stay: Payer: Medicare Other | Attending: Hematology & Oncology

## 2021-06-20 ENCOUNTER — Other Ambulatory Visit: Payer: Self-pay

## 2021-06-20 ENCOUNTER — Other Ambulatory Visit (INDEPENDENT_AMBULATORY_CARE_PROVIDER_SITE_OTHER): Payer: Medicare Other

## 2021-06-20 VITALS — BP 122/60 | HR 76 | Temp 98.2°F | Resp 18

## 2021-06-20 DIAGNOSIS — I700 Atherosclerosis of aorta: Secondary | ICD-10-CM | POA: Diagnosis not present

## 2021-06-20 DIAGNOSIS — D631 Anemia in chronic kidney disease: Secondary | ICD-10-CM

## 2021-06-20 DIAGNOSIS — D500 Iron deficiency anemia secondary to blood loss (chronic): Secondary | ICD-10-CM | POA: Insufficient documentation

## 2021-06-20 DIAGNOSIS — K909 Intestinal malabsorption, unspecified: Secondary | ICD-10-CM

## 2021-06-20 LAB — HEPATIC FUNCTION PANEL
ALT: 20 U/L (ref 0–53)
AST: 23 U/L (ref 0–37)
Albumin: 4.1 g/dL (ref 3.5–5.2)
Alkaline Phosphatase: 88 U/L (ref 39–117)
Bilirubin, Direct: 0.1 mg/dL (ref 0.0–0.3)
Total Bilirubin: 0.6 mg/dL (ref 0.2–1.2)
Total Protein: 7.2 g/dL (ref 6.0–8.3)

## 2021-06-20 LAB — LIPID PANEL
Cholesterol: 125 mg/dL (ref 0–200)
HDL: 57.4 mg/dL (ref >39.00)
LDL Cholesterol: 51 mg/dL (ref 0–99)
NonHDL: 67.27
Total CHOL/HDL Ratio: 2
Triglycerides: 82 mg/dL (ref 0.0–149.0)
VLDL: 16.4 mg/dL (ref 0.0–40.0)

## 2021-06-20 MED ORDER — SODIUM CHLORIDE 0.9 % IV SOLN: INTRAVENOUS | Status: DC

## 2021-06-20 MED ORDER — SODIUM CHLORIDE 0.9 % IV SOLN
1000.0000 mg | Freq: Once | INTRAVENOUS | Status: AC
  Administered 2021-06-20: 1000 mg via INTRAVENOUS
  Filled 2021-06-20: qty 10

## 2021-06-20 NOTE — Patient Instructions (Signed)

## 2021-06-20 NOTE — Progress Notes (Signed)
Pt. Refused to wait 30 min post infusion. Released stable and ASX.

## 2021-06-25 ENCOUNTER — Other Ambulatory Visit: Payer: Self-pay

## 2021-06-25 ENCOUNTER — Non-Acute Institutional Stay (HOSPITAL_COMMUNITY)
Admission: RE | Admit: 2021-06-25 | Discharge: 2021-06-25 | Disposition: A | Discharge status: Home / Self Care | Payer: Medicare Other | Source: Ambulatory Visit | Attending: Internal Medicine | Admitting: Internal Medicine

## 2021-06-25 DIAGNOSIS — K5180 Other ulcerative colitis without complications: Secondary | ICD-10-CM | POA: Insufficient documentation

## 2021-06-25 DIAGNOSIS — K5190 Ulcerative colitis, unspecified, without complications: Secondary | ICD-10-CM | POA: Diagnosis present

## 2021-06-25 DIAGNOSIS — K51919 Ulcerative colitis, unspecified with unspecified complications: Secondary | ICD-10-CM | POA: Diagnosis not present

## 2021-06-25 DIAGNOSIS — K529 Noninfective gastroenteritis and colitis, unspecified: Secondary | ICD-10-CM | POA: Diagnosis not present

## 2021-06-25 LAB — CBC WITH DIFFERENTIAL/PLATELET
Abs Immature Granulocytes: 0.01 10*3/uL (ref 0.00–0.07)
Basophils Absolute: 0 10*3/uL (ref 0.0–0.1)
Basophils Relative: 1 %
Eosinophils Absolute: 0.2 10*3/uL (ref 0.0–0.5)
Eosinophils Relative: 6 %
HCT: 34.4 % — ABNORMAL LOW (ref 39.0–52.0)
Hemoglobin: 11 g/dL — ABNORMAL LOW (ref 13.0–17.0)
Immature Granulocytes: 0 %
Lymphocytes Relative: 32 %
Lymphs Abs: 1.1 10*3/uL (ref 0.7–4.0)
MCH: 30.8 pg (ref 26.0–34.0)
MCHC: 32 g/dL (ref 30.0–36.0)
MCV: 96.4 fL (ref 80.0–100.0)
Monocytes Absolute: 0.4 10*3/uL (ref 0.1–1.0)
Monocytes Relative: 13 %
Neutro Abs: 1.6 10*3/uL — ABNORMAL LOW (ref 1.7–7.7)
Neutrophils Relative %: 48 %
Platelets: 239 10*3/uL (ref 150–400)
RBC: 3.57 MIL/uL — ABNORMAL LOW (ref 4.22–5.81)
RDW: 15.9 % — ABNORMAL HIGH (ref 11.5–15.5)
WBC: 3.3 10*3/uL — ABNORMAL LOW (ref 4.0–10.5)
nRBC: 0.6 % — ABNORMAL HIGH (ref 0.0–0.2)

## 2021-06-25 LAB — COMPREHENSIVE METABOLIC PANEL
ALT: 23 U/L (ref 0–44)
AST: 28 U/L (ref 15–41)
Albumin: 4 g/dL (ref 3.5–5.0)
Alkaline Phosphatase: 84 U/L (ref 38–126)
Anion gap: 5 (ref 5–15)
BUN: 20 mg/dL (ref 8–23)
CO2: 25 mmol/L (ref 22–32)
Calcium: 8.9 mg/dL (ref 8.9–10.3)
Chloride: 109 mmol/L (ref 98–111)
Creatinine, Ser: 0.75 mg/dL (ref 0.61–1.24)
GFR, Estimated: >60 mL/min (ref >60)
Glucose, Bld: 109 mg/dL — ABNORMAL HIGH (ref 70–99)
Potassium: 4 mmol/L (ref 3.5–5.1)
Sodium: 139 mmol/L (ref 135–145)
Total Bilirubin: 0.6 mg/dL (ref 0.3–1.2)
Total Protein: 7.3 g/dL (ref 6.5–8.1)

## 2021-06-25 LAB — SEDIMENTATION RATE: Sed Rate: 28 mm/hr — ABNORMAL HIGH (ref 0–16)

## 2021-06-25 MED ORDER — SODIUM CHLORIDE 0.9 % IV SOLN: INTRAVENOUS | Status: DC | PRN

## 2021-06-25 MED ORDER — SODIUM CHLORIDE 0.9 % IV SOLN
10.0000 mg/kg | INTRAVENOUS | Status: DC
  Administered 2021-06-25: 900 mg via INTRAVENOUS
  Filled 2021-06-25: qty 90

## 2021-06-25 NOTE — Progress Notes (Addendum)
PATIENT CARE CENTER NOTE   Diagnosis: Ulcerative colitis     Provider: Justice Britain MD     Procedure: Remicade infusion 10 mg/kg + lab draw ( cbc , cmp, esr, crp).     Note: Patient received Remicade infusion ( doses #1 of 6) via PIV. Pre meds not required per orders. Labs collected per orders. Infusion titrated per protocol. Patient tolerated well with no adverse reaction. Vital signs stable. Pt declined AVS. Patient to come back  monthly for infusion, instructed to schedule next appointment at front desk prior to leaving , pt verbalized understanding. Alert, oriented and ambulatory at discharge.

## 2021-06-26 LAB — HIGH SENSITIVITY CRP: CRP, High Sensitivity: 1.57 mg/L (ref 0.00–3.00)

## 2021-06-27 ENCOUNTER — Other Ambulatory Visit: Payer: Self-pay

## 2021-06-27 ENCOUNTER — Telehealth: Payer: Self-pay

## 2021-06-27 NOTE — Telephone Encounter (Signed)
-----   Message from Irving Copas., MD sent at 06/27/2021  2:21 PM EST ----- Regarding: CRP change Leveda Kendrix, Can you please transition this patient's standing labs to a normal CRP rather than high sensitivity? Thanks. GM

## 2021-06-27 NOTE — Telephone Encounter (Signed)
I have added the new orders as requested

## 2021-07-23 ENCOUNTER — Ambulatory Visit: Payer: Medicare Other | Admitting: Gastroenterology

## 2021-07-24 ENCOUNTER — Inpatient Hospital Stay (HOSPITAL_COMMUNITY): Admission: RE | Admit: 2021-07-24 | Payer: Medicare Other | Source: Ambulatory Visit

## 2021-07-29 ENCOUNTER — Other Ambulatory Visit: Payer: Self-pay

## 2021-07-29 ENCOUNTER — Inpatient Hospital Stay: Payer: Medicare Other | Attending: Hematology & Oncology

## 2021-07-29 ENCOUNTER — Inpatient Hospital Stay: Payer: Medicare Other | Admitting: Hematology & Oncology

## 2021-07-29 ENCOUNTER — Encounter: Payer: Self-pay | Admitting: Hematology & Oncology

## 2021-07-29 ENCOUNTER — Inpatient Hospital Stay: Payer: Medicare Other

## 2021-07-29 ENCOUNTER — Telehealth: Payer: Self-pay | Admitting: *Deleted

## 2021-07-29 VITALS — BP 131/63 | HR 93 | Temp 98.9°F | Resp 18 | Wt 215.0 lb

## 2021-07-29 DIAGNOSIS — D500 Iron deficiency anemia secondary to blood loss (chronic): Secondary | ICD-10-CM

## 2021-07-29 DIAGNOSIS — D631 Anemia in chronic kidney disease: Secondary | ICD-10-CM

## 2021-07-29 DIAGNOSIS — N1831 Chronic kidney disease, stage 3a: Secondary | ICD-10-CM | POA: Diagnosis not present

## 2021-07-29 DIAGNOSIS — K909 Intestinal malabsorption, unspecified: Secondary | ICD-10-CM

## 2021-07-29 LAB — IRON AND IRON BINDING CAPACITY (CC-WL,HP ONLY)
Iron: 68 ug/dL (ref 45–182)
Saturation Ratios: 24 % (ref 17.9–39.5)
TIBC: 290 ug/dL (ref 250–450)
UIBC: 222 ug/dL (ref 117–376)

## 2021-07-29 LAB — CBC WITH DIFFERENTIAL (CANCER CENTER ONLY)
Abs Immature Granulocytes: 0.03 10*3/uL (ref 0.00–0.07)
Basophils Absolute: 0 10*3/uL (ref 0.0–0.1)
Basophils Relative: 0 %
Eosinophils Absolute: 0.2 10*3/uL (ref 0.0–0.5)
Eosinophils Relative: 4 %
HCT: 32.4 % — ABNORMAL LOW (ref 39.0–52.0)
Hemoglobin: 10.7 g/dL — ABNORMAL LOW (ref 13.0–17.0)
Immature Granulocytes: 1 %
Lymphocytes Relative: 33 %
Lymphs Abs: 1.5 10*3/uL (ref 0.7–4.0)
MCH: 31.3 pg (ref 26.0–34.0)
MCHC: 33 g/dL (ref 30.0–36.0)
MCV: 94.7 fL (ref 80.0–100.0)
Monocytes Absolute: 0.6 10*3/uL (ref 0.1–1.0)
Monocytes Relative: 12 %
Neutro Abs: 2.3 10*3/uL (ref 1.7–7.7)
Neutrophils Relative %: 50 %
Platelet Count: 278 10*3/uL (ref 150–400)
RBC: 3.42 MIL/uL — ABNORMAL LOW (ref 4.22–5.81)
RDW: 15.8 % — ABNORMAL HIGH (ref 11.5–15.5)
WBC Count: 4.6 10*3/uL (ref 4.0–10.5)
nRBC: 0 % (ref 0.0–0.2)

## 2021-07-29 LAB — FERRITIN: Ferritin: 163 ng/mL (ref 24–336)

## 2021-07-29 LAB — CMP (CANCER CENTER ONLY)
ALT: 19 U/L (ref 0–44)
AST: 18 U/L (ref 15–41)
Albumin: 3.9 g/dL (ref 3.5–5.0)
Alkaline Phosphatase: 78 U/L (ref 38–126)
Anion gap: 9 (ref 5–15)
BUN: 20 mg/dL (ref 8–23)
CO2: 26 mmol/L (ref 22–32)
Calcium: 9.6 mg/dL (ref 8.9–10.3)
Chloride: 104 mmol/L (ref 98–111)
Creatinine: 0.98 mg/dL (ref 0.61–1.24)
GFR, Estimated: >60 mL/min (ref >60)
Glucose, Bld: 109 mg/dL — ABNORMAL HIGH (ref 70–99)
Potassium: 4 mmol/L (ref 3.5–5.1)
Sodium: 139 mmol/L (ref 135–145)
Total Bilirubin: 0.4 mg/dL (ref 0.3–1.2)
Total Protein: 6.9 g/dL (ref 6.5–8.1)

## 2021-07-29 LAB — RETICULOCYTES
Immature Retic Fract: 14.7 % (ref 2.3–15.9)
RBC.: 3.47 MIL/uL — ABNORMAL LOW (ref 4.22–5.81)
Retic Count, Absolute: 55.2 10*3/uL (ref 19.0–186.0)
Retic Ct Pct: 1.6 % (ref 0.4–3.1)

## 2021-07-29 MED ORDER — EPOETIN ALFA-EPBX 40000 UNIT/ML IJ SOLN
40000.0000 [IU] | Freq: Once | INTRAMUSCULAR | Status: AC
  Administered 2021-07-29: 40000 [IU] via SUBCUTANEOUS
  Filled 2021-07-29: qty 1

## 2021-07-29 NOTE — Patient Instructions (Signed)

## 2021-07-29 NOTE — Telephone Encounter (Signed)
-----   Message from [name], MD sent at 07/29/2021  1:42 PM EST ----- Please call and tell him that the iron level is actually okay.  [name]

## 2021-07-29 NOTE — Progress Notes (Signed)
Hematology and Oncology Follow Up Visit  Justin Rosales [id number] 07/09/1951 71 y.o. 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 twice daily-to complete 6 months of therapy in June 2022 Retacrit 40,000 units subcu weekly for Hgb <11     Interim History:  Justin Rosales is back for follow-up.  We last saw him back in November.  He has been doing quite well.  He has not had Retacrit probably for about 3 months.  There is been no problems with bleeding.  He gets his Remicade tomorrow.  The last iron studies that we have on him back in November showed a ferritin of 52 with an iron saturation of 12%.  He did get a dose of Monoferric.  He has had no fever.  He has had no cough or shortness of breath.  He has had no rashes.  He has had no leg swelling.  Overall, I would say his performance status is probably ECOG 1.    Medications:  Current Outpatient Medications:    aspirin 81 MG EC tablet, Take 1 tablet (81 mg total) by mouth daily. Swallow whole., Disp: 30 tablet, Rfl: 12   Bepotastine Besilate 1.5 % SOLN, 1 drop 2 (two) times daily., Disp: , Rfl:    Cholecalciferol (DIALYVITE VITAMIN D 5000) 125 MCG (5000 UT) capsule, Take 5,000 Units by mouth daily., Disp: , Rfl:    diphenhydrAMINE HCl, Sleep, (ZZZQUIL PO), Take 10 mLs by mouth daily as needed (sleep)., Disp: , Rfl:    folic acid (FOLVITE) 1 MG tablet, Take 2 tablets (2 mg total) by mouth daily., Disp: 60 tablet, Rfl: 12   inFLIXimab in sodium chloride 0.9 %, Inject into the vein. 01/16/2021 To start taking every 4 weeks., Disp: , Rfl:    mercaptopurine (PURINETHOL) 50 MG tablet, TAKE 1 AND 1/2 TABLETS BY  MOUTH DAILY, Disp: 180 tablet, Rfl: 3   Multiple Vitamin (MULTIVITAMIN WITH MINERALS) TABS  tablet, Take 1 tablet by mouth daily., Disp: , Rfl:    NON FORMULARY, Take 1 tablet by mouth daily. Vital reds-energy supplement, Disp: , Rfl:    rosuvastatin (CRESTOR) 20 MG tablet, Take 1 tablet (20 mg total) by mouth daily., Disp: 90 tablet, Rfl: 3   Saccharomyces boulardii (FLORASTOR PO), Take 1 capsule by mouth in the morning and at bedtime., Disp: , Rfl:    sildenafil (VIAGRA) 100 MG tablet, Take 0.5-1 tablets (50-100 mg total) by mouth daily as needed for erectile dysfunction., Disp: 30 tablet, Rfl: 2  Allergies: No Known Allergies  Past Medical History, Surgical history, Social history, and Family History were reviewed and updated.  Review of Systems: Review of Systems  Constitutional:  Positive for fatigue.  HENT:  Negative.    Eyes: Negative.   Respiratory:  Positive for shortness of breath.   Cardiovascular: Negative.   Gastrointestinal:  Positive for blood in stool and diarrhea.  Endocrine: Negative.   Genitourinary: Negative.    Musculoskeletal: Negative.   Skin: Negative.   Neurological: Negative.   Hematological: Negative.   Psychiatric/Behavioral: Negative.     Physical Exam:  weight is 215 lb (97.5 kg). His oral temperature is 98.9 F (37.2 C). His blood pressure is 131/63 and his pulse is 93. His respiration is 18 and oxygen saturation is 97%.  Wt Readings from Last 3 Encounters:  07/29/21 215 lb (97.5 kg)  06/25/21 214 lb 3.2 oz (97.2 kg)  05/29/21 209 lb 12.8 oz (95.2 kg)    Physical Exam Vitals reviewed.  HENT:     Head: Normocephalic and atraumatic.  Eyes:     Pupils: Pupils are equal, round, and reactive to light.  Cardiovascular:     Rate and Rhythm: Normal rate and regular rhythm.     Heart sounds: Normal heart sounds.  Pulmonary:     Effort: Pulmonary effort is normal.     Breath sounds: Normal breath sounds.  Abdominal:     General: Bowel sounds are normal.     Palpations: Abdomen is soft.  Musculoskeletal:        General: No tenderness  or deformity. Normal range of motion.     Cervical back: Normal range of motion.  Lymphadenopathy:     Cervical: No cervical adenopathy.  Skin:    General: Skin is warm and dry.     Findings: No erythema or rash.  Neurological:     Mental Status: He is alert and oriented to person, place, and time.  Psychiatric:        Behavior: Behavior normal.        Thought Content: Thought content normal.        Judgment: Judgment normal.   Lab Results  Component Value Date   WBC 4.6 07/29/2021   HGB 10.7 (L) 07/29/2021   HCT 32.4 (L) 07/29/2021   MCV 94.7 07/29/2021   PLT 278 07/29/2021     Chemistry      Component Value Date/Time   NA 139 07/29/2021 0818   K 4.0 07/29/2021 0818   CL 104 07/29/2021 0818   CO2 26 07/29/2021 0818   BUN 20 07/29/2021 0818   CREATININE 0.98 07/29/2021 0818      Component Value Date/Time   CALCIUM 9.6 07/29/2021 0818   ALKPHOS 78 07/29/2021 0818   AST 18 07/29/2021 0818   ALT 19 07/29/2021 0818   BILITOT 0.4 07/29/2021 0818       Impression and Plan: Justin Rosales is a very nice 71 year old white male.  He has ulcerative colitis.  He had Clostridium diarrhea.  He became profoundly iron deficient.    We will go ahead and give him a dose of Retacrit today.  I am sure that his iron should be okay.  Again we will have to check to see what the iron level is.  We will plan for another follow-up in 2 or 3 months.  I would like to try to get him through the Winter.    [name], MD 1/9/20239:08 AM

## 2021-07-29 NOTE — Telephone Encounter (Signed)
As noted below by Dr. Marin Olp, I informed the patient that his iron level is OK. He verbalized understanding.

## 2021-07-30 ENCOUNTER — Telehealth: Payer: Self-pay

## 2021-07-30 ENCOUNTER — Non-Acute Institutional Stay (HOSPITAL_COMMUNITY)
Admission: RE | Admit: 2021-07-30 | Discharge: 2021-07-30 | Disposition: A | Discharge status: Home / Self Care | Payer: Medicare Other | Source: Ambulatory Visit | Attending: Internal Medicine | Admitting: Internal Medicine

## 2021-07-30 DIAGNOSIS — K5190 Ulcerative colitis, unspecified, without complications: Secondary | ICD-10-CM | POA: Insufficient documentation

## 2021-07-30 DIAGNOSIS — K51919 Ulcerative colitis, unspecified with unspecified complications: Secondary | ICD-10-CM | POA: Diagnosis not present

## 2021-07-30 LAB — CBC
HCT: 32.6 % — ABNORMAL LOW (ref 39.0–52.0)
Hemoglobin: 10.3 g/dL — ABNORMAL LOW (ref 13.0–17.0)
MCH: 30.3 pg (ref 26.0–34.0)
MCHC: 31.6 g/dL (ref 30.0–36.0)
MCV: 95.9 fL (ref 80.0–100.0)
Platelets: 259 10*3/uL (ref 150–400)
RBC: 3.4 MIL/uL — ABNORMAL LOW (ref 4.22–5.81)
RDW: 16 % — ABNORMAL HIGH (ref 11.5–15.5)
WBC: 4.2 10*3/uL (ref 4.0–10.5)
nRBC: 0 % (ref 0.0–0.2)

## 2021-07-30 LAB — COMPREHENSIVE METABOLIC PANEL
ALT: 20 U/L (ref 0–44)
AST: 25 U/L (ref 15–41)
Albumin: 3.5 g/dL (ref 3.5–5.0)
Alkaline Phosphatase: 71 U/L (ref 38–126)
Anion gap: 4 — ABNORMAL LOW (ref 5–15)
BUN: 17 mg/dL (ref 8–23)
CO2: 24 mmol/L (ref 22–32)
Calcium: 8.5 mg/dL — ABNORMAL LOW (ref 8.9–10.3)
Chloride: 106 mmol/L (ref 98–111)
Creatinine, Ser: 0.73 mg/dL (ref 0.61–1.24)
GFR, Estimated: >60 mL/min (ref >60)
Glucose, Bld: 102 mg/dL — ABNORMAL HIGH (ref 70–99)
Potassium: 4.3 mmol/L (ref 3.5–5.1)
Sodium: 134 mmol/L — ABNORMAL LOW (ref 135–145)
Total Bilirubin: 0.5 mg/dL (ref 0.3–1.2)
Total Protein: 7.1 g/dL (ref 6.5–8.1)

## 2021-07-30 LAB — SEDIMENTATION RATE: Sed Rate: 60 mm/hr — ABNORMAL HIGH (ref 0–16)

## 2021-07-30 LAB — C-REACTIVE PROTEIN: CRP: 0.5 mg/dL (ref <1.0)

## 2021-07-30 MED ORDER — SODIUM CHLORIDE 0.9 % IV SOLN
10.0000 mg/kg | INTRAVENOUS | Status: DC
  Filled 2021-07-30: qty 100

## 2021-07-30 MED ORDER — SODIUM CHLORIDE 0.9 % IV SOLN
900.0000 mg | INTRAVENOUS | Status: DC
  Administered 2021-07-30: 900 mg via INTRAVENOUS
  Filled 2021-07-30: qty 90

## 2021-07-30 MED ORDER — SODIUM CHLORIDE 0.9 % IV SOLN: INTRAVENOUS | Status: DC | PRN

## 2021-07-30 NOTE — Telephone Encounter (Signed)
FFYI Dr Rush Landmark

## 2021-07-30 NOTE — Progress Notes (Addendum)
PATIENT CARE CENTER NOTE   Diagnosis: Ulcerative colitis      Provider: Justice Britain MD     Procedure: Remicade 47m/kg infusion + lab draw     Note: Patient received Remicade ( dose #2 of 6) infusion via PIV. Pre meds not required per orders. CBC, CMP, ESR, and CRP collected as ordered.  Infusion titrated per protocol. Patient tolerated well with no adverse reaction. PKoren ShiverRN notified via epic secure chat, that BP was below previously documented baseline ranging from 125/58- 113/62. RN to inform Dr. MRush Landmark no intervention required. Pt voices no complaints. Vital signs stable. Pt declined AVS. Patient to come back monthly, scheduled for next infusion on 08/27/21, pt made aware, verbalized understanding.  Alert, oriented and ambulatory at discharge.

## 2021-08-02 MED FILL — Sodium Chloride IV Soln 0.9%: INTRAVENOUS | Qty: 250 | Status: AC

## 2021-08-02 MED FILL — Infliximab For IV Inj 100 MG: INTRAVENOUS | Qty: 90 | Status: AC

## 2021-08-16 NOTE — Progress Notes (Signed)
Stage 3a

## 2021-08-21 ENCOUNTER — Encounter (HOSPITAL_COMMUNITY): Payer: Medicare Other

## 2021-08-27 ENCOUNTER — Other Ambulatory Visit: Payer: Medicare Other

## 2021-08-27 ENCOUNTER — Non-Acute Institutional Stay (HOSPITAL_COMMUNITY)
Admission: RE | Admit: 2021-08-27 | Discharge: 2021-08-27 | Disposition: A | Discharge status: Home / Self Care | Payer: Medicare Other | Source: Ambulatory Visit | Attending: Internal Medicine | Admitting: Internal Medicine

## 2021-08-27 ENCOUNTER — Other Ambulatory Visit: Payer: Self-pay

## 2021-08-27 ENCOUNTER — Encounter: Payer: Self-pay | Admitting: Gastroenterology

## 2021-08-27 ENCOUNTER — Ambulatory Visit: Payer: Medicare Other | Admitting: Gastroenterology

## 2021-08-27 VITALS — BP 160/76 | HR 88 | Ht 69.5 in | Wt 220.2 lb

## 2021-08-27 DIAGNOSIS — K529 Noninfective gastroenteritis and colitis, unspecified: Secondary | ICD-10-CM

## 2021-08-27 DIAGNOSIS — K51919 Ulcerative colitis, unspecified with unspecified complications: Secondary | ICD-10-CM | POA: Diagnosis not present

## 2021-08-27 DIAGNOSIS — K51019 Ulcerative (chronic) pancolitis with unspecified complications: Secondary | ICD-10-CM

## 2021-08-27 DIAGNOSIS — R933 Abnormal findings on diagnostic imaging of other parts of digestive tract: Secondary | ICD-10-CM | POA: Diagnosis not present

## 2021-08-27 DIAGNOSIS — K51018 Ulcerative (chronic) pancolitis with other complication: Secondary | ICD-10-CM | POA: Diagnosis not present

## 2021-08-27 DIAGNOSIS — D500 Iron deficiency anemia secondary to blood loss (chronic): Secondary | ICD-10-CM | POA: Diagnosis not present

## 2021-08-27 DIAGNOSIS — Z9225 Personal history of immunosupression therapy: Secondary | ICD-10-CM

## 2021-08-27 LAB — COMPREHENSIVE METABOLIC PANEL
ALT: 25 U/L (ref 0–44)
AST: 25 U/L (ref 15–41)
Albumin: 3.8 g/dL (ref 3.5–5.0)
Alkaline Phosphatase: 79 U/L (ref 38–126)
Anion gap: 5 (ref 5–15)
BUN: 15 mg/dL (ref 8–23)
CO2: 27 mmol/L (ref 22–32)
Calcium: 9.1 mg/dL (ref 8.9–10.3)
Chloride: 105 mmol/L (ref 98–111)
Creatinine, Ser: 0.75 mg/dL (ref 0.61–1.24)
GFR, Estimated: >60 mL/min (ref >60)
Glucose, Bld: 107 mg/dL — ABNORMAL HIGH (ref 70–99)
Potassium: 4 mmol/L (ref 3.5–5.1)
Sodium: 137 mmol/L (ref 135–145)
Total Bilirubin: 0.3 mg/dL (ref 0.3–1.2)
Total Protein: 7.3 g/dL (ref 6.5–8.1)

## 2021-08-27 LAB — CBC WITH DIFFERENTIAL/PLATELET
Abs Immature Granulocytes: 0 10*3/uL (ref 0.00–0.07)
Basophils Absolute: 0 10*3/uL (ref 0.0–0.1)
Basophils Relative: 1 %
Eosinophils Absolute: 0.2 10*3/uL (ref 0.0–0.5)
Eosinophils Relative: 6 %
HCT: 36.9 % — ABNORMAL LOW (ref 39.0–52.0)
Hemoglobin: 11.8 g/dL — ABNORMAL LOW (ref 13.0–17.0)
Immature Granulocytes: 0 %
Lymphocytes Relative: 34 %
Lymphs Abs: 1.3 10*3/uL (ref 0.7–4.0)
MCH: 30.6 pg (ref 26.0–34.0)
MCHC: 32 g/dL (ref 30.0–36.0)
MCV: 95.8 fL (ref 80.0–100.0)
Monocytes Absolute: 0.5 10*3/uL (ref 0.1–1.0)
Monocytes Relative: 13 %
Neutro Abs: 1.8 10*3/uL (ref 1.7–7.7)
Neutrophils Relative %: 46 %
Platelets: 233 10*3/uL (ref 150–400)
RBC: 3.85 MIL/uL — ABNORMAL LOW (ref 4.22–5.81)
RDW: 16.5 % — ABNORMAL HIGH (ref 11.5–15.5)
WBC: 3.8 10*3/uL — ABNORMAL LOW (ref 4.0–10.5)
nRBC: 0 % (ref 0.0–0.2)

## 2021-08-27 LAB — SEDIMENTATION RATE: Sed Rate: 34 mm/hr — ABNORMAL HIGH (ref 0–16)

## 2021-08-27 LAB — C-REACTIVE PROTEIN: CRP: 0.7 mg/dL (ref <1.0)

## 2021-08-27 MED ORDER — SODIUM CHLORIDE 0.9 % IV SOLN
10.0000 mg/kg | INTRAVENOUS | Status: DC
  Administered 2021-08-27: 1000 mg via INTRAVENOUS
  Filled 2021-08-27: qty 100

## 2021-08-27 MED ORDER — SODIUM CHLORIDE 0.9 % IV SOLN: INTRAVENOUS | Status: DC | PRN

## 2021-08-27 NOTE — Patient Instructions (Signed)
Your provider has requested that you go to the basement level for lab work before leaving today. Press "B" on the elevator. The lab is located at the first door on the left as you exit the elevator. (Stool kit)  You will need Infliximab Concentration test before your next infusion on 09/24/21.  We will see you back in 3 months. Office will contact you to schedule at a later time to schedule.   If you are age 71 or older, your body mass index should be between 23-30. Your Body mass index is 32.06 kg/m. If this is out of the aforementioned range listed, please consider follow up with your Primary Care Provider.  If you are age 61 or younger, your body mass index should be between 19-25. Your Body mass index is 32.06 kg/m. If this is out of the aformentioned range listed, please consider follow up with your Primary Care Provider.   ________________________________________________________  The [facility] providers would like to encourage you to use [facility] to communicate with providers for non-urgent requests or questions.  Due to long hold times on the telephone, sending your provider a message by [facility] may be a faster and more efficient way to get a response.  Please allow 48 business hours for a response.  Please remember that this is for non-urgent requests.  _______________________________________________________  Thank you for choosing me and [facility].  Dr. Rush Landmark

## 2021-08-27 NOTE — Progress Notes (Signed)
[facility] VISIT   Primary Care Provider Shelda Pal, DO [address] [place] [address] [phone]  Patient Profile: Justin Rosales is a 71 y.o. male with a pmh significant for chronic panulcerative colitis (now on 6MP + Remicade 18m/kg Q4W), prior C. difficile infection (requiring p.o. vancomycin and fidaxomycin), history of Candida esophagitis, chronic anemia with iron deficiency (s/p IV Iron infusions), hypertension, hyperlipidemia, prior VTE (now off AC).  [facility] for an evaluation and management of problem(s) noted below:  Problem List 1. Ulcerative pancolitis with other complication ([place]   2. Abnormal colonoscopy   3. Iron deficiency anemia due to chronic blood loss   4. Personal history of immunosuppressive therapy     History of Present Illness Please see prior notes for full details of HPI.  Interval History The patient returns for follow-up.  He was scheduled in January to see [place] so was rescheduled to today.  Patient is accompanied by his wife.  Patient continues to feel well.  He had his Remicade infusion today.  It does cause him to get a little bit weak for a few days after but he improves pretty quickly thereafter.  He remains on 10 mg/kg dosing at this time.  His laboratories are returning today.  He is having between 1 and 3 soft to nearly firm normal bowel movements per day.  He has not noted any blood in his stools.  He is not having any urgency at this time either and he is having no nocturnal symptoms.  He is feeling well.  He and his wife do express whether he will need to remain on this dosing for forever or would other medications be needed since he still had healing occurring at the time of his November colonoscopy.  No issues of incontinence have occurred.  BMs per day -1-3 Nocturnal BMs -none Blood -none Mucous -none Tenesmus  -none Urgency -none Skin Manifestations -none Eye Manifestations -none Joint Manifestations -none  GI Review of Systems Positive as above Negative for dysphagia, odynophagia, pain, nausea, vomiting early satiety, melena, hematochezia   Review of Systems General: Denies fevers/chills/weight loss unintentionally HEENT: Denies oral lesions or oral thrush Cardiovascular: Denies chest pain/palpitations Pulmonary: Denies shortness of breath Gastroenterological: See HPI Genitourinary: Denies darkened urine Hematological: Denies easy bruising or bleeding at this time Dermatological: Denies jaundice Psychological: Mood is stable   Medications Current Outpatient Medications  Medication Sig Dispense Refill   aspirin 81 MG EC tablet Take 1 tablet (81 mg total) by mouth daily. Swallow whole. 30 tablet 12   Bepotastine Besilate 1.5 % SOLN 1 drop 2 (two) times daily.     Cholecalciferol (DIALYVITE VITAMIN D 5000) 125 MCG (5000 UT) capsule Take 5,000 Units by mouth daily.     diphenhydrAMINE HCl, Sleep, (ZZZQUIL PO) Take 10 mLs by mouth daily as needed (sleep).     folic acid (FOLVITE) 1 MG tablet Take 2 tablets (2 mg total) by mouth daily. 60 tablet 12   inFLIXimab in sodium chloride 0.9 % Inject into the vein. 01/16/2021 To start taking every 4 weeks.     mercaptopurine (PURINETHOL) 50 MG tablet TAKE 1 AND 1/2 TABLETS BY  MOUTH DAILY 180 tablet 3   Multiple Vitamin (MULTIVITAMIN WITH MINERALS) TABS tablet Take 1 tablet by mouth daily.     NON FORMULARY Take 1 tablet by mouth daily. Vital reds-energy supplement     rosuvastatin (CRESTOR)  20 MG tablet Take 1 tablet (20 mg total) by mouth daily. 90 tablet 3   Saccharomyces boulardii (FLORASTOR PO) Take 1 capsule by mouth in the morning and at bedtime.     sildenafil (VIAGRA) 100 MG tablet Take 0.5-1 tablets (50-100 mg total) by mouth daily as needed for erectile dysfunction. 30 tablet 2   No current facility-administered medications for this  visit.    Allergies No Known Allergies  Histories Past Medical History:  Diagnosis Date   Allergy    Anemia    Arthritis    Blood transfusion without reported diagnosis    C. 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 Dr Dorrene German possibly With [facility] x2. last one done around 2012    Social History   Socioeconomic History   Marital status: Married    Spouse name: Justin Rosales   Number of children: 2   Years of education: Not on file   Highest education level: Not on file  Occupational History   Occupation: Emergency planning/management officer   Occupation: retired  Tobacco Use   Smoking status: Former    Packs/day: 1.00    Years: 30.00    Pack years: 30.00    Types: Cigarettes    Quit date: 2001    Years since quitting: 22.1   Smokeless tobacco: Never  Vaping Use   Vaping Use: Never used  Substance and Sexual Activity   Alcohol use: Yes    Comment: ocassionally   Drug use: Not Currently   Sexual activity: Not on file  Other Topics Concern   Not on file  Social History Narrative   Not on file   Social Determinants of Health   Financial Resource Strain: Not on file  Food Insecurity: Not on file  Transportation Needs: Not on file  Physical Activity: Not on file  Stress: Not on file  Social Connections: Not on file  Intimate Partner Violence: Not on file   Family History  Problem Relation Age of Onset   Hypertension Mother    Diabetes Mother    Alzheimer's disease Mother    Parkinson's disease  Father    Alzheimer's disease Father    Colon cancer Neg Hx    Esophageal cancer Neg Hx    Rectal cancer Neg Hx    Stomach cancer Neg Hx    I have reviewed his medical, social, and family history in detail and updated the electronic medical record as necessary.    PHYSICAL EXAMINATION  BP (!) 160/76 (BP Location: Left Arm, Patient Position: Sitting, Cuff Size: Normal)    Pulse 88    Ht 5' 9.5" (1.765 m) Comment: height measured without shoes   Wt 220 lb 4 oz (99.9 kg)    BMI 32.06 kg/m  Wt Readings from Last 3 Encounters:  08/27/21 221 lb 6.4 oz (100.4 kg)  08/27/21 220 lb 4 oz (99.9 kg)  07/30/21 215 lb 9.6 oz (97.8 kg)  GEN: NAD, appears stated age, accompanied by wife PSYCH:  Cooperative, without pressured speech EYE: Conjunctivae pink, sclerae anicteric ENT: MMM CV: Nontachycardic RESP: No audible wheezing GI: NABS, soft, rounded, NT/ND, without rebound MSK/EXT: No lower extremity edema SKIN: No jaundice NEURO:  Alert & Oriented x 3, no focal deficits   REVIEW OF DATA  I reviewed the following data at the time of this encounter:  GI Procedures and Studies  November 2022 colonoscopy - Hemorrhoids found on digital rectal exam. - The examined portion of the ileum was normal. - Diverticulosis in the ascending colon. - One 3 mm polyp in the ascending colon, removed with a cold snare. Resected and retrieved. - Normal mucosa in the proximal transverse colon, at the hepatic flexure, in the ascending colon and in the cecum. Biopsied. - Scar at the hepatic flexure. - A single (solitary) ulcer in the mid transverse colon and in the distal transverse colon.  Biopsied. Tattooed. - Diffuse moderate mucosal changes were found in the mid transverse colon and in the distal transverse colon secondary to colitis. - Colitis. Inflammation was found from the sigmoid colon to the splenic flexure. This was moderate in severity and severe, improved compared to previous examinations. Biopsied. -  Normal mucosa in the rectum. Biopsied. - Non-bleeding non-thrombosed external and internal hemorrhoids.  Pathology Diagnosis 1. Surgical [P], right colon - COLONIC MUCOSA WITH SLIGHT HYPEREMIA. - NO ACTIVE INFLAMMATION OR GRANULOMAS. - NO FEATURES OF CYTOMEGALOVIRUS. 2. Surgical [P], colon, ascending, polyp (1) - TUBULAR ADENOMA WITHOUT HIGH GRADE DYSPLASIA. 3. Surgical [P], colon, transverse ulcer - INFLAMED COLONIC MUCOSA WITH GRANULATION TISSUE CONSISTENT WITH ULCER. - NO GRANULOMAS IDENTIFIED. - SEE MICROSCOPIC DESCRIPTION 4. Surgical [P], left colon - COLONIC MUCOSA WITH GRANULATION TISSUE CONSISTENT WITH ULCER. - NO GRANULOMAS IDENTIFIED. - SEE MICROSCOPIC DESCRIPTION 5. Surgical [P], colon, rectum - COLONIC MUCOSA WITH HYPEREMIA. - NO ACTIVE INFLAMMATION, CHRONIC CHANGES OR GRANULOMAS. Negative for CMV immunostaining  Laboratory Studies  Reviewed those in epic and care everywhere  Imaging Studies  No new imaging to review   ASSESSMENT  Mr. Prats is a 71 y.o. male with a pmh significant for chronic panulcerative colitis (now on 6MP + Remicade 57m/kg Q4W), prior C. difficile infection (requiring p.o. vancomycin and fidaxomycin), history of Candida esophagitis, chronic anemia with iron deficiency (s/p IV Iron infusions), hypertension, hyperlipidemia, prior VTE (now off AC).  The patient is seen today for evaluation and management of:  1. Ulcerative pancolitis with other complication ([place]   2. Abnormal colonoscopy   3. Iron deficiency anemia due to chronic blood loss   4. Personal history of immunosuppressive therapy    The patient remains clinically and hemodynamically stable.  Clinically if we are going based just on symptoms I would say he is very close if not already in a remission.  With that being said, his biomarkers of his ESR remain elevated and he has had in the past elevations in his fecal calprotectin I am not able to adjust his 6-MP dosing anymore due to his  WBC count.  When we saw his last colonoscopy he did have evidence of persistent changes of inflammation but significantly improved from prior.  It is not clear to me that we have had Remicade on for long enough dosing to know whether he will get complete healing from a mucosal standpoint though I would like to have that if possible.  I am hesitant about switching his medications though we certainly could consider the possibility of Entyvio or Stelara or Rinvok.  I did discuss Skyrizi which was  a medication that was brought up by the patient and wife, but this is only for use in Crohn's patients and cannot be used ulcerative colitis.  My preference would be to keep him as he is doing for at least another 4 to 6 months total.  At that time we can consider repeat colonoscopy and if he is still having issues with a mucosal healing standpoint then we can make a decision together whether we switch from Remicade to another agent versus continuing the course if he is doing well and potentially trying to lengthen the intervals wean his visits if possible to every 6 weeks or back to every 8 weeks depending on things.  I will recheck his Remicade trough level and antibody levels with his next infusion.  We will try to obtain a fecal calprotectin as well at his convenience.  All patient questions were answered to the best of my ability, and the patient agrees to the aforementioned plan of action with follow-up as indicated.   PLAN  Continue Remicade 10 mg/kg every 4 weeks Continue 6-MP 75 mg daily TDM of Remicade in his March fusion Consider repeat endoscopic evaluation in 2024 summer  If patient still has evidence of nonmucosal healing at that time, then may need to consider possible transition of medication Entyvio/Stelara/Rinvok ESR/CRP/CMP/CBC to be obtained with each infusion Will obtain a fecal calprotectin as able   Orders Placed This Encounter  Procedures   Calprotectin, Fecal   Infliximab+Ab (Serial  Monitor)     New Prescriptions   No medications on file   Modified Medications   No medications on file    Planned Follow Up Return in about 3 months (around 11/24/2021).   Total Time in Face-to-Face and in Coordination of Care for patient including independent/personal interpretation/review of prior testing, medical history, examination, medication adjustment, communicating results with the patient directly, and documentation with the EHR is 25 minutes.   Justice Britain, MD [facility] Office # [phone]

## 2021-08-27 NOTE — Progress Notes (Signed)
PATIENT CARE CENTER NOTE   Diagnosis: Ulcerative Colitis     Provider: Justice Britain MD     Procedure: Remicade 75m/kg + lab draw     Note: Patient received Remicade infusion ( dose # 3 of 6) via PIV. Premeds not required per orders.  Labs collected prior to infusion -CBC, CMP, CRP, and ESR per orders. Infusion titrated per protocol. Patient tolerated well with no adverse reaction. BP elevated throughout infusion, pt voices no complaints, Dr. MRush Landmarknotified via Epic secure chat, states will discuss this with pt at visit scheduled with him today at 3pm, no further intervention required. Pt made aware, verbalized understanding. BP after infusion completed 152/73, MD notified. Pt voices no complaints.  Pt declined AVS. Patient scheduled to RTC on 09/24/21, pt aware.  Alert, oriented and ambulatory at discharge.

## 2021-08-28 ENCOUNTER — Encounter: Payer: Self-pay | Admitting: Gastroenterology

## 2021-08-28 ENCOUNTER — Encounter: Payer: Self-pay | Admitting: Hematology & Oncology

## 2021-09-09 ENCOUNTER — Telehealth: Payer: Self-pay | Admitting: Family Medicine

## 2021-09-09 NOTE — Telephone Encounter (Signed)
Left message for patient to call back and schedule Medicare Annual Wellness Visit (AWV) in office.  ° °If not able to come in office, please offer to do virtually or by telephone.  Left office number and my jabber #[phone]. ° °Due for AWVI ° °Please schedule at anytime with Nurse Health Advisor. °  °

## 2021-09-16 ENCOUNTER — Other Ambulatory Visit: Payer: Self-pay | Admitting: Hematology & Oncology

## 2021-09-23 ENCOUNTER — Encounter: Payer: Self-pay | Admitting: Hematology & Oncology

## 2021-09-23 ENCOUNTER — Inpatient Hospital Stay: Payer: Medicare Other | Admitting: Hematology & Oncology

## 2021-09-23 ENCOUNTER — Other Ambulatory Visit: Payer: Self-pay

## 2021-09-23 ENCOUNTER — Inpatient Hospital Stay: Payer: Medicare Other

## 2021-09-23 ENCOUNTER — Inpatient Hospital Stay: Payer: Medicare Other | Attending: Hematology & Oncology

## 2021-09-23 VITALS — BP 147/71 | HR 82 | Temp 98.3°F | Resp 18 | Wt 220.4 lb

## 2021-09-23 DIAGNOSIS — I82431 Acute embolism and thrombosis of right popliteal vein: Secondary | ICD-10-CM | POA: Insufficient documentation

## 2021-09-23 DIAGNOSIS — K51911 Ulcerative colitis, unspecified with rectal bleeding: Secondary | ICD-10-CM | POA: Diagnosis not present

## 2021-09-23 DIAGNOSIS — Z7901 Long term (current) use of anticoagulants: Secondary | ICD-10-CM | POA: Insufficient documentation

## 2021-09-23 DIAGNOSIS — K909 Intestinal malabsorption, unspecified: Secondary | ICD-10-CM

## 2021-09-23 DIAGNOSIS — D500 Iron deficiency anemia secondary to blood loss (chronic): Secondary | ICD-10-CM | POA: Diagnosis not present

## 2021-09-23 LAB — FERRITIN: Ferritin: 20 ng/mL — ABNORMAL LOW (ref 24–336)

## 2021-09-23 LAB — CMP (CANCER CENTER ONLY)
ALT: 18 U/L (ref 0–44)
AST: 20 U/L (ref 15–41)
Albumin: 3.7 g/dL (ref 3.5–5.0)
Alkaline Phosphatase: 87 U/L (ref 38–126)
Anion gap: 5 (ref 5–15)
BUN: 13 mg/dL (ref 8–23)
CO2: 30 mmol/L (ref 22–32)
Calcium: 9.2 mg/dL (ref 8.9–10.3)
Chloride: 105 mmol/L (ref 98–111)
Creatinine: 0.88 mg/dL (ref 0.61–1.24)
GFR, Estimated: >60 mL/min (ref >60)
Glucose, Bld: 104 mg/dL — ABNORMAL HIGH (ref 70–99)
Potassium: 4.7 mmol/L (ref 3.5–5.1)
Sodium: 140 mmol/L (ref 135–145)
Total Bilirubin: 0.4 mg/dL (ref 0.3–1.2)
Total Protein: 6.8 g/dL (ref 6.5–8.1)

## 2021-09-23 LAB — CBC WITH DIFFERENTIAL (CANCER CENTER ONLY)
Abs Immature Granulocytes: 0.02 10*3/uL (ref 0.00–0.07)
Basophils Absolute: 0 10*3/uL (ref 0.0–0.1)
Basophils Relative: 1 %
Eosinophils Absolute: 0.3 10*3/uL (ref 0.0–0.5)
Eosinophils Relative: 9 %
HCT: 36.7 % — ABNORMAL LOW (ref 39.0–52.0)
Hemoglobin: 12 g/dL — ABNORMAL LOW (ref 13.0–17.0)
Immature Granulocytes: 1 %
Lymphocytes Relative: 41 %
Lymphs Abs: 1.6 10*3/uL (ref 0.7–4.0)
MCH: 30.9 pg (ref 26.0–34.0)
MCHC: 32.7 g/dL (ref 30.0–36.0)
MCV: 94.6 fL (ref 80.0–100.0)
Monocytes Absolute: 0.5 10*3/uL (ref 0.1–1.0)
Monocytes Relative: 13 %
Neutro Abs: 1.3 10*3/uL — ABNORMAL LOW (ref 1.7–7.7)
Neutrophils Relative %: 35 %
Platelet Count: 216 10*3/uL (ref 150–400)
RBC: 3.88 MIL/uL — ABNORMAL LOW (ref 4.22–5.81)
RDW: 16 % — ABNORMAL HIGH (ref 11.5–15.5)
WBC Count: 3.8 10*3/uL — ABNORMAL LOW (ref 4.0–10.5)
nRBC: 0 % (ref 0.0–0.2)

## 2021-09-23 LAB — RETICULOCYTES
Immature Retic Fract: 12.3 % (ref 2.3–15.9)
RBC.: 3.83 MIL/uL — ABNORMAL LOW (ref 4.22–5.81)
Retic Count, Absolute: 91.2 10*3/uL (ref 19.0–186.0)
Retic Ct Pct: 2.4 % (ref 0.4–3.1)

## 2021-09-23 LAB — IRON AND IRON BINDING CAPACITY (CC-WL,HP ONLY)
Iron: 82 ug/dL (ref 45–182)
Saturation Ratios: 22 % (ref 17.9–39.5)
TIBC: 372 ug/dL (ref 250–450)
UIBC: 290 ug/dL (ref 117–376)

## 2021-09-23 NOTE — Progress Notes (Signed)
?Hematology and Oncology Follow Up Visit ? ?Justin Rosales ?[id number] ?[birth year] 71 y.o. ?09/23/2021 ? ? ?Principle Diagnosis:  ?Iron deficiency anemia secondary to GI blood loss of malabsorption secondary to ulcerative colitis ?Thromboembolism in the right popliteal vein ?Erythropoietin deficient anemia ? ?Current Therapy:   ?IV iron as needed -dose given in 06/20/2021 ?Eliquis 5 mg p.o. twice daily-to complete 6 months of therapy in June 2022 ?Retacrit 40,000 units subcu weekly for Hgb <11 ?    ?Interim History:  Justin Rosales is back for follow-up.  So far, he is doing quite well.  He really has had no problems since we last saw him.  There is no bleeding.  He has had no problems with his rheumatism.  He does get Remicade. ? ?He has had no fever.  He has had no rashes.  There is been no leg swelling. ? ?His last iron studies back in January showed a ferritin of 163 with an iron saturation of 24%. ? ?He has had no cough or shortness of breath.  He has had no headache.  He has had no mouth sores. ? ?Overall, his performance status is ECOG 0. ?  ? ?Medications:  ?Current Outpatient Medications:  ?  aspirin 81 MG EC tablet, Take 1 tablet (81 mg total) by mouth daily. Swallow whole., Disp: 30 tablet, Rfl: 12 ?  Bepotastine Besilate 1.5 % SOLN, 1 drop 2 (two) times daily., Disp: , Rfl:  ?  Cholecalciferol (DIALYVITE VITAMIN D 5000) 125 MCG (5000 UT) capsule, Take 5,000 Units by mouth daily., Disp: , Rfl:  ?  diphenhydrAMINE HCl, Sleep, (ZZZQUIL PO), Take 10 mLs by mouth daily as needed (sleep)., Disp: , Rfl:  ?  folic acid (FOLVITE) 1 MG tablet, Take 2 tablets (2 mg total) by mouth daily., Disp: 60 tablet, Rfl: 12 ?  inFLIXimab in sodium chloride 0.9 %, Inject into the vein. 01/16/2021 To start taking every 4 weeks., Disp: , Rfl:  ?  mercaptopurine (PURINETHOL) 50 MG tablet, TAKE 1 AND 1/2 TABLETS BY  MOUTH DAILY, Disp: 180 tablet, Rfl: 3 ?  Multiple Vitamin (MULTIVITAMIN WITH MINERALS) TABS tablet, Take 1 tablet by mouth  daily., Disp: , Rfl:  ?  NON FORMULARY, Take 1 tablet by mouth daily. Vital reds-energy supplement, Disp: , Rfl:  ?  rosuvastatin (CRESTOR) 20 MG tablet, Take 1 tablet (20 mg total) by mouth daily., Disp: 90 tablet, Rfl: 3 ?  Saccharomyces boulardii (FLORASTOR PO), Take 1 capsule by mouth in the morning and at bedtime., Disp: , Rfl:  ?  sildenafil (VIAGRA) 100 MG tablet, Take 0.5-1 tablets (50-100 mg total) by mouth daily as needed for erectile dysfunction., Disp: 30 tablet, Rfl: 2 ? ?Allergies: No Known Allergies ? ?Past Medical History, Surgical history, Social history, and Family History were reviewed and updated. ? ?Review of Systems: ?Review of Systems  ?Constitutional:  Positive for fatigue.  ?HENT:  Negative.    ?Eyes: Negative.   ?Respiratory:  Positive for shortness of breath.   ?Cardiovascular: Negative.   ?Gastrointestinal:  Positive for blood in stool and diarrhea.  ?Endocrine: Negative.   ?Genitourinary: Negative.    ?Musculoskeletal: Negative.   ?Skin: Negative.   ?Neurological: Negative.   ?Hematological: Negative.   ?Psychiatric/Behavioral: Negative.    ? ?Physical Exam: ? vitals were not taken for this visit.  ? ?Wt Readings from Last 3 Encounters:  ?08/27/21 221 lb 6.4 oz (100.4 kg)  ?08/27/21 220 lb 4 oz (99.9 kg)  ?07/30/21 215 lb 9.6 oz (97.8 kg)  ? ? ?  Physical Exam ?Vitals reviewed.  ?HENT:  ?   Head: Normocephalic and atraumatic.  ?Eyes:  ?   Pupils: Pupils are equal, round, and reactive to light.  ?Cardiovascular:  ?   Rate and Rhythm: Normal rate and regular rhythm.  ?   Heart sounds: Normal heart sounds.  ?Pulmonary:  ?   Effort: Pulmonary effort is normal.  ?   Breath sounds: Normal breath sounds.  ?Abdominal:  ?   General: Bowel sounds are normal.  ?   Palpations: Abdomen is soft.  ?Musculoskeletal:     ?   General: No tenderness or deformity. Normal range of motion.  ?   Cervical back: Normal range of motion.  ?Lymphadenopathy:  ?   Cervical: No cervical adenopathy.  ?Skin: ?   General:  Skin is warm and dry.  ?   Findings: No erythema or rash.  ?Neurological:  ?   Mental Status: He is alert and oriented to person, place, and time.  ?Psychiatric:     ?   Behavior: Behavior normal.     ?   Thought Content: Thought content normal.     ?   Judgment: Judgment normal.  ? ?Lab Results  ?Component Value Date  ? WBC 3.8 (L) 09/23/2021  ? HGB 12.0 (L) 09/23/2021  ? HCT 36.7 (L) 09/23/2021  ? MCV 94.6 09/23/2021  ? PLT 216 09/23/2021  ? ?  Chemistry   ?   ?Component Value Date/Time  ? NA 140 09/23/2021 0822  ? K 4.7 09/23/2021 0822  ? CL 105 09/23/2021 0822  ? CO2 30 09/23/2021 0822  ? BUN 13 09/23/2021 0822  ? CREATININE 0.88 09/23/2021 0822  ?    ?Component Value Date/Time  ? CALCIUM 9.2 09/23/2021 0822  ? ALKPHOS 87 09/23/2021 0822  ? AST 20 09/23/2021 0822  ? ALT 18 09/23/2021 0822  ? BILITOT 0.4 09/23/2021 4287  ?  ? ? ? ?Impression and Plan: ?Justin Rosales is a very nice 71 year old white male.  He has ulcerative colitis.  He had Clostridium diarrhea.  He became profoundly iron deficient.   ? ?His blood continues to improve.  I am so happy for him. ? ?I think we can try to move his appointment to 3 months now.  We will try to get him back after [facility] Day.  I told him that if he has any problems, he can always give [place] a holler. ? ? ?[name], MD ?3/6/20238:57 AM ?

## 2021-09-24 ENCOUNTER — Non-Acute Institutional Stay (HOSPITAL_COMMUNITY)
Admission: RE | Admit: 2021-09-24 | Discharge: 2021-09-24 | Disposition: A | Discharge status: Home / Self Care | Payer: Medicare Other | Source: Ambulatory Visit | Attending: Internal Medicine | Admitting: Internal Medicine

## 2021-09-24 ENCOUNTER — Other Ambulatory Visit: Payer: Self-pay

## 2021-09-24 DIAGNOSIS — K51019 Ulcerative (chronic) pancolitis with unspecified complications: Secondary | ICD-10-CM | POA: Diagnosis not present

## 2021-09-24 LAB — CBC
HCT: 36.1 % — ABNORMAL LOW (ref 39.0–52.0)
Hemoglobin: 11.6 g/dL — ABNORMAL LOW (ref 13.0–17.0)
MCH: 30.8 pg (ref 26.0–34.0)
MCHC: 32.1 g/dL (ref 30.0–36.0)
MCV: 95.8 fL (ref 80.0–100.0)
Platelets: 236 10*3/uL (ref 150–400)
RBC: 3.77 MIL/uL — ABNORMAL LOW (ref 4.22–5.81)
RDW: 16.1 % — ABNORMAL HIGH (ref 11.5–15.5)
WBC: 3.3 10*3/uL — ABNORMAL LOW (ref 4.0–10.5)
nRBC: 0 % (ref 0.0–0.2)

## 2021-09-24 LAB — COMPREHENSIVE METABOLIC PANEL
ALT: 23 U/L (ref 0–44)
AST: 25 U/L (ref 15–41)
Albumin: 3.9 g/dL (ref 3.5–5.0)
Alkaline Phosphatase: 75 U/L (ref 38–126)
Anion gap: 6 (ref 5–15)
BUN: 15 mg/dL (ref 8–23)
CO2: 26 mmol/L (ref 22–32)
Calcium: 8.9 mg/dL (ref 8.9–10.3)
Chloride: 103 mmol/L (ref 98–111)
Creatinine, Ser: 0.89 mg/dL (ref 0.61–1.24)
GFR, Estimated: >60 mL/min (ref >60)
Glucose, Bld: 104 mg/dL — ABNORMAL HIGH (ref 70–99)
Potassium: 4 mmol/L (ref 3.5–5.1)
Sodium: 135 mmol/L (ref 135–145)
Total Bilirubin: 0.4 mg/dL (ref 0.3–1.2)
Total Protein: 7.3 g/dL (ref 6.5–8.1)

## 2021-09-24 LAB — SEDIMENTATION RATE: Sed Rate: 26 mm/hr — ABNORMAL HIGH (ref 0–16)

## 2021-09-24 LAB — C-REACTIVE PROTEIN: CRP: 0.6 mg/dL (ref <1.0)

## 2021-09-24 MED ORDER — SODIUM CHLORIDE 0.9 % IV SOLN: INTRAVENOUS | Status: DC | PRN

## 2021-09-24 MED ORDER — SODIUM CHLORIDE 0.9 % IV SOLN
10.0000 mg/kg | INTRAVENOUS | Status: DC
  Administered 2021-09-24: 1000 mg via INTRAVENOUS
  Filled 2021-09-24: qty 100

## 2021-09-24 NOTE — Progress Notes (Signed)
PATIENT CARE CENTER NOTE ?  ?Diagnosis: Ulcerative Colitis ?  ?  ?Provider: Justice Britain MD ?  ?  ?Procedure: Remicade 40m/kg + lab draw ?  ?  ?Note: Patient received Remicade infusion ( dose # 4 of 6) via PIV. Premeds not required per orders.  Labs collected prior to infusion -CBC, CMP, CRP, and ESR per orders. Infusion titrated per protocol. Patient tolerated well with no adverse reaction. BP elevated throughout infusion, pt voices no complaints. Patient's BP is consistently elevated and Dr. MRush Landmarkaware.  AVS offered but patient refused. Patient alert, oriented and ambulatory at discharge. ?

## 2021-10-01 ENCOUNTER — Ambulatory Visit (INDEPENDENT_AMBULATORY_CARE_PROVIDER_SITE_OTHER): Payer: Medicare Other

## 2021-10-01 ENCOUNTER — Other Ambulatory Visit: Payer: Self-pay

## 2021-10-01 DIAGNOSIS — Z0000 Encounter for general adult medical examination without abnormal findings: Secondary | ICD-10-CM

## 2021-10-01 NOTE — Progress Notes (Addendum)
Virtual Visit via Telephone Note ? ?I connected with  Justin Rosales on 10/01/21 at  3:15 PM EDT by telephone and verified that I am speaking with the correct person using two identifiers. ? ?Medicare Annual Wellness visit completed telephonically due to Covid-19 pandemic.  ? ?Persons participating in this call: This Health Coach and this patient.  ? ?Location: ?Patient: Home ?Provider: Office ?  ?I discussed the limitations, risks, security and privacy concerns of performing an evaluation and management service by telephone and the availability of in person appointments. The patient expressed understanding and agreed to proceed. ? ?Unable to perform video visit due to video visit attempted and failed and/or patient does not have video capability.  ? ?Some vital signs may be absent or patient reported.  ? ?Willette Brace, LPN ? ? ?Subjective:  ? Justin Rosales is a 71 y.o. male who presents for an Initial Medicare Annual Wellness Visit. ? ?Review of Systems    ? ?Cardiac Risk Factors include: advanced age (>14mn, >>94women);hypertension;dyslipidemia;male gender ? ?   ?Objective:  ?  ?There were no vitals filed for this visit. ?There is no height or weight on file to calculate BMI. ? ?Advanced Directives 10/01/2021 09/23/2021 07/29/2021 05/29/2021 04/03/2021 02/19/2021 01/16/2021  ?Does Patient Have a Medical Advance Directive? Yes No No No Yes No No  ?Type of Advance Directive [place] patient want to make changes to medical advance directive? - - No - Patient declined - - - -  ?Copy of [place]? No - copy requested - - - - - -  ?Would patient like information on creating a medical advance directive? - No - Patient declined - No - Patient declined - No - Patient declined No - Patient declined  ? ? ?Current Medications (verified) ?Outpatient Encounter Medications as of 10/01/2021  ?Medication Sig  ? aspirin 81 MG EC tablet Take 1 tablet (81 mg total) by mouth  daily. Swallow whole.  ? Bepotastine Besilate 1.5 % SOLN 1 drop 2 (two) times daily.  ? Cholecalciferol (DIALYVITE VITAMIN D 5000) 125 MCG (5000 UT) capsule Take 5,000 Units by mouth daily.  ? diphenhydrAMINE HCl, Sleep, (ZZZQUIL PO) Take 10 mLs by mouth daily as needed (sleep).  ? folic acid (FOLVITE) 1 MG tablet Take 2 tablets (2 mg total) by mouth daily.  ? inFLIXimab in sodium chloride 0.9 % Inject into the vein. 01/16/2021 To start taking every 4 weeks.  ? mercaptopurine (PURINETHOL) 50 MG tablet TAKE 1 AND 1/2 TABLETS BY  MOUTH DAILY  ? Multiple Vitamin (MULTIVITAMIN WITH MINERALS) TABS tablet Take 1 tablet by mouth daily.  ? NON FORMULARY Take 1 tablet by mouth daily. Vital reds-energy supplement  ? rosuvastatin (CRESTOR) 20 MG tablet Take 1 tablet (20 mg total) by mouth daily.  ? Saccharomyces boulardii (FLORASTOR PO) Take 1 capsule by mouth in the morning and at bedtime.  ? sildenafil (VIAGRA) 100 MG tablet Take 0.5-1 tablets (50-100 mg total) by mouth daily as needed for erectile dysfunction.  ? ?No facility-administered encounter medications on file as of 10/01/2021.  ? ? ?Allergies (verified) ?Patient has no known allergies.  ? ?History: ?Past Medical History:  ?Diagnosis Date  ? Allergy   ? Anemia   ? Arthritis   ? Blood transfusion without reported diagnosis   ? C. difficile diarrhea   ? DVT ([place] thrombosis) ([place]   ? right  ? Elevated cholesterol   ?  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 Goals of care, counseling/discussion 08/28/2020  ? Hypertension   ? 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 Lower leg DVT (deep venous thromboembolism), acute, right ([place]) 09/28/2020  ? UC (ulcerative colitis) ([place])   ? ?Past Surgical History:  ?Procedure Laterality Date  ? COLONOSCOPY    ? First done at [facility] around age 10. Dr Dorrene German possibly With [facility] x2. last one done around 2012   ? ?Family History  ?Problem Relation Age  of Onset  ? Hypertension Mother   ? Diabetes Mother   ? Alzheimer's disease Mother   ? Parkinson's disease Father   ? Alzheimer's disease Father   ? Colon cancer Neg Hx   ? Esophageal cancer Neg Hx   ? Rectal cancer Neg Hx   ? Stomach cancer Neg Hx   ? ?Social History  ? ?Socioeconomic History  ? Marital status: Married  ?  Spouse name: Justin Rosales  ? Number of children: 2  ? Years of education: Not on file  ? Highest education level: Not on file  ?Occupational History  ? Occupation: Emergency planning/management officer  ? Occupation: retired  ?Tobacco Use  ? Smoking status: Former  ?  Packs/day: 1.00  ?  Years: 30.00  ?  Pack years: 30.00  ?  Types: Cigarettes  ?  Quit date: 2001  ?  Years since quitting: 22.2  ? Smokeless tobacco: Never  ?Vaping Use  ? Vaping Use: Never used  ?Substance and Sexual Activity  ? Alcohol use: Yes  ?  Comment: ocassionally  ? Drug use: Not Currently  ? Sexual activity: Not on file  ?Other Topics Concern  ? Not on file  ?Social History Narrative  ? Not on file  ? ?Social Determinants of Health  ? ?Financial Resource Strain: Low Risk   ? Difficulty of Paying Living Expenses: Not hard at all  ?Food Insecurity: No Food Insecurity  ? Worried About Charity fundraiser in the Last Year: Never true  ? Ran Out of Food in the Last Year: Never true  ?Transportation Needs: No Transportation Needs  ? Lack of Transportation (Medical): No  ? Lack of Transportation (Non-Medical): No  ?Physical Activity: Insufficiently Active  ? Days of Exercise per Week: 2 days  ? Minutes of Exercise per Session: 60 min  ?Stress: No Stress Concern Present  ? Feeling of Stress : Not at all  ?Social Connections: Socially Integrated  ? Frequency of Communication with Friends and Family: More than three times a week  ? Frequency of Social Gatherings with Friends and Family: More than three times a week  ? Attends Religious Services: 1 to 4 times per year  ? Active Member of Clubs or Organizations: Yes  ? Attends Archivist Meetings: 1 to  4 times per year  ? Marital Status: Married  ? ? ?Tobacco Counseling ?Counseling given: Not Answered ? ? ?Clinical Intake: ? ?Pre-visit preparation completed: Yes ? ?Pain : No/denies pain ? ?  ? ?BMI - recorded: 32.08 ?Nutritional Status: BMI > 30  Obese ?Diabetes: No ? ?How often do you need to have someone help you when you read instructions, pamphlets, or other written materials from your doctor or pharmacy?: 1 - Never ? ?Diabetic?no ? ?Interpreter Needed?: No ? ?Information entered by :: Charlott Rakes, LPN ? ? ?Activities of Daily Living ?In your present state of health, do you have any difficulty performing the  following activities: 10/01/2021  ?Hearing? N  ?Vision? N  ?Difficulty concentrating or making decisions? N  ?Walking or climbing stairs? N  ?Dressing or bathing? N  ?Doing errands, shopping? N  ?Preparing Food and eating ? N  ?Using the Toilet? N  ?In the past six months, have you accidently leaked urine? N  ?Do you have problems with loss of bowel control? N  ?Managing your Medications? N  ?Managing your Finances? N  ?Housekeeping or managing your Housekeeping? N  ?Some recent data might be hidden  ? ? ?Patient Care Team: ?Shelda Pal, DO as PCP - General (Family Medicine) ? ?Indicate any recent Medical Services you may have received from other than [facility] providers in the past year (date may be approximate). ? ?   ?Assessment:  ? This is a routine wellness examination for Justin Rosales. ? ?Hearing/Vision screen ?Hearing Screening - Comments:: Pt denies any hearing issues  ?Vision Screening - Comments:: Pt follows up with eye exams  DR Sharen Counter for annual ye exams  ? ?Dietary issues and exercise activities discussed: ?Current Exercise Habits: Home exercise routine, Type of exercise: walking;Other - see comments, Time (Minutes): 60, Frequency (Times/Week): 2, Weekly Exercise (Minutes/Week): 120 ? ? Goals Addressed   ? ?  ?  ?  ?  ? This Visit's Progress  ?  Patient Stated     ?  Exercise more  ?  ? ?   ? ?Depression Screen ?PHQ 2/9 Scores 10/01/2021 12/07/2020  ?PHQ - 2 Score 0 0  ?  ?Fall Risk ?Fall Risk  10/01/2021  ?Falls in the past year? 1  ?Number falls in past yr: 1  ?Injury with Fall? 1  ?Comme

## 2021-10-01 NOTE — Patient Instructions (Addendum)
Justin Rosales , ?Thank you for taking time to come for your Medicare Wellness Visit. I appreciate your ongoing commitment to your health goals. Please review the following plan we discussed and let me know if I can assist you in the future.  ? ?Screening recommendations/referrals: ?Colonoscopy: Done 05/22/21 repeat every year ?Recommended yearly ophthalmology/optometry visit for glaucoma screening and checkup ?Recommended yearly dental visit for hygiene and checkup ? ?Vaccinations: ?Influenza vaccine: Done 05/09/21 repeat every year ?Pneumococcal vaccine: Up to date ?Tdap vaccine: Done 03/22/15 repeat every year ?Shingles vaccine: Shingrix discussed. Please contact your pharmacy for coverage information.    ?Covid-19: Completed 2/12, 09/27/19 ? ?Advanced directives: Please bring a copy of your health care power of attorney and living will to the office at your convenience. ? ?Conditions/risks identified: increase exercise  ? ?Next appointment: Follow up in one year for your annual wellness visit.  ? ?Preventive Care 6 Years and Older, Male ?Preventive care refers to lifestyle choices and visits with your health care provider that can promote health and wellness. ?What does preventive care include? ?A yearly physical exam. This is also called an annual well check. ?Dental exams once or twice a year. ?Routine eye exams. Ask your health care provider how often you should have your eyes checked. ?Personal lifestyle choices, including: ?Daily care of your teeth and gums. ?Regular physical activity. ?Eating a healthy diet. ?Avoiding tobacco and drug use. ?Limiting alcohol use. ?Practicing safe sex. ?Taking low doses of aspirin every day. ?Taking vitamin and mineral supplements as recommended by your health care provider. ?What happens during an annual well check? ?The services and screenings done by your health care provider during your annual well check will depend on your age, overall health, lifestyle risk factors, and family  history of disease. ?Counseling  ?Your health care provider may ask you questions about your: ?Alcohol use. ?Tobacco use. ?Drug use. ?Emotional well-being. ?Home and relationship well-being. ?Sexual activity. ?Eating habits. ?History of falls. ?Memory and ability to understand (cognition). ?Work and work Statistician. ?Screening  ?You may have the following tests or measurements: ?Height, weight, and BMI. ?Blood pressure. ?Lipid and cholesterol levels. These may be checked every 5 years, or more frequently if you are over 71 years old. ?Skin check. ?Lung cancer screening. You may have this screening every year starting at age 29 if you have a 30-pack-year history of smoking and currently smoke or have quit within the past 15 years. ?Fecal occult blood test (FOBT) of the stool. You may have this test every year starting at age 33. ?Flexible sigmoidoscopy or colonoscopy. You may have a sigmoidoscopy every 5 years or a colonoscopy every 10 years starting at age 58. ?Prostate cancer screening. Recommendations will vary depending on your family history and other risks. ?Hepatitis C blood test. ?Hepatitis B blood test. ?Sexually transmitted disease (STD) testing. ?Diabetes screening. This is done by checking your blood sugar (glucose) after you have not eaten for a while (fasting). You may have this done every 1-3 years. ?Abdominal aortic aneurysm (AAA) screening. You may need this if you are a current or former smoker. ?Osteoporosis. You may be screened starting at age 75 if you are at high risk. ?Talk with your health care provider about your test results, treatment options, and if necessary, the need for more tests. ?Vaccines  ?Your health care provider may recommend certain vaccines, such as: ?Influenza vaccine. This is recommended every year. ?Tetanus, diphtheria, and acellular pertussis (Tdap, Td) vaccine. You may need a Td booster every  10 years. ?Zoster vaccine. You may need this after age 93. ?Pneumococcal  13-valent conjugate (PCV13) vaccine. One dose is recommended after age 86. ?Pneumococcal polysaccharide (PPSV23) vaccine. One dose is recommended after age 25. ?Talk to your health care provider about which screenings and vaccines you need and how often you need them. ?This information is not intended to replace advice given to you by your health care provider. Make sure you discuss any questions you have with your health care provider. ?Document Released: 08/03/2015 Document Revised: 03/26/2016 Document Reviewed: 05/08/2015 ?Elsevier Interactive Patient Education ? 2017 [place]. ? ?Fall Prevention in the Home ?Falls can cause injuries. They can happen to people of all ages. There are many things you can do to make your home safe and to help prevent falls. ?What can I do on the outside of my home? ?Regularly fix the edges of walkways and driveways and fix any cracks. ?Remove anything that might make you trip as you walk through a door, such as a raised step or threshold. ?Trim any bushes or trees on the path to your home. ?Use bright outdoor lighting. ?Clear any walking paths of anything that might make someone trip, such as rocks or tools. ?Regularly check to see if handrails are loose or broken. Make sure that both sides of any steps have handrails. ?Any raised decks and porches should have guardrails on the edges. ?Have any leaves, snow, or ice cleared regularly. ?Use sand or salt on walking paths during winter. ?Clean up any spills in your garage right away. This includes oil or grease spills. ?What can I do in the bathroom? ?Use night lights. ?Install grab bars by the toilet and in the tub and shower. Do not use towel bars as grab bars. ?Use non-skid mats or decals in the tub or shower. ?If you need to sit down in the shower, use a plastic, non-slip stool. ?Keep the floor dry. Clean up any water that spills on the floor as soon as it happens. ?Remove soap buildup in the tub or shower regularly. ?Attach  bath mats securely with double-sided non-slip rug tape. ?Do not have throw rugs and other things on the floor that can make you trip. ?What can I do in the bedroom? ?Use night lights. ?Make sure that you have a light by your bed that is easy to reach. ?Do not use any sheets or blankets that are too big for your bed. They should not hang down onto the floor. ?Have a firm chair that has side arms. You can use this for support while you get dressed. ?Do not have throw rugs and other things on the floor that can make you trip. ?What can I do in the kitchen? ?Clean up any spills right away. ?Avoid walking on wet floors. ?Keep items that you use a lot in easy-to-reach places. ?If you need to reach something above you, use a strong step stool that has a grab bar. ?Keep electrical cords out of the way. ?Do not use floor polish or wax that makes floors slippery. If you must use wax, use non-skid floor wax. ?Do not have throw rugs and other things on the floor that can make you trip. ?What can I do with my stairs? ?Do not leave any items on the stairs. ?Make sure that there are handrails on both sides of the stairs and use them. Fix handrails that are broken or loose. Make sure that handrails are as long as the stairways. ?Check any carpeting to make  sure that it is firmly attached to the stairs. Fix any carpet that is loose or worn. ?Avoid having throw rugs at the top or bottom of the stairs. If you do have throw rugs, attach them to the floor with carpet tape. ?Make sure that you have a light switch at the top of the stairs and the bottom of the stairs. If you do not have them, ask someone to add them for you. ?What else can I do to help prevent falls? ?Wear shoes that: ?Do not have high heels. ?Have rubber bottoms. ?Are comfortable and fit you well. ?Are closed at the toe. Do not wear sandals. ?If you use a stepladder: ?Make sure that it is fully opened. Do not climb a closed stepladder. ?Make sure that both sides of the  stepladder are locked into place. ?Ask someone to hold it for you, if possible. ?Clearly mark and make sure that you can see: ?Any grab bars or handrails. ?First and last steps. ?Where the edge of each step is. ?

## 2021-10-22 ENCOUNTER — Non-Acute Institutional Stay (HOSPITAL_COMMUNITY)
Admission: RE | Admit: 2021-10-22 | Discharge: 2021-10-22 | Disposition: A | Discharge status: Home / Self Care | Payer: Medicare Other | Source: Ambulatory Visit | Attending: Internal Medicine | Admitting: Internal Medicine

## 2021-10-22 ENCOUNTER — Telehealth: Payer: Self-pay

## 2021-10-22 DIAGNOSIS — K51919 Ulcerative colitis, unspecified with unspecified complications: Secondary | ICD-10-CM

## 2021-10-22 DIAGNOSIS — K5190 Ulcerative colitis, unspecified, without complications: Secondary | ICD-10-CM | POA: Insufficient documentation

## 2021-10-22 LAB — CBC WITH DIFFERENTIAL/PLATELET
Abs Immature Granulocytes: 0.01 10*3/uL (ref 0.00–0.07)
Basophils Absolute: 0 10*3/uL (ref 0.0–0.1)
Basophils Relative: 1 %
Eosinophils Absolute: 0.2 10*3/uL (ref 0.0–0.5)
Eosinophils Relative: 6 %
HCT: 37.6 % — ABNORMAL LOW (ref 39.0–52.0)
Hemoglobin: 12.4 g/dL — ABNORMAL LOW (ref 13.0–17.0)
Immature Granulocytes: 0 %
Lymphocytes Relative: 36 %
Lymphs Abs: 1.4 10*3/uL (ref 0.7–4.0)
MCH: 31.6 pg (ref 26.0–34.0)
MCHC: 33 g/dL (ref 30.0–36.0)
MCV: 95.9 fL (ref 80.0–100.0)
Monocytes Absolute: 0.5 10*3/uL (ref 0.1–1.0)
Monocytes Relative: 12 %
Neutro Abs: 1.8 10*3/uL (ref 1.7–7.7)
Neutrophils Relative %: 45 %
Platelets: 243 10*3/uL (ref 150–400)
RBC: 3.92 MIL/uL — ABNORMAL LOW (ref 4.22–5.81)
RDW: 15.9 % — ABNORMAL HIGH (ref 11.5–15.5)
WBC: 3.9 10*3/uL — ABNORMAL LOW (ref 4.0–10.5)
nRBC: 0 % (ref 0.0–0.2)

## 2021-10-22 LAB — COMPREHENSIVE METABOLIC PANEL
ALT: 20 U/L (ref 0–44)
AST: 21 U/L (ref 15–41)
Albumin: 3.9 g/dL (ref 3.5–5.0)
Alkaline Phosphatase: 82 U/L (ref 38–126)
Anion gap: 6 (ref 5–15)
BUN: 16 mg/dL (ref 8–23)
CO2: 25 mmol/L (ref 22–32)
Calcium: 9.1 mg/dL (ref 8.9–10.3)
Chloride: 109 mmol/L (ref 98–111)
Creatinine, Ser: 0.82 mg/dL (ref 0.61–1.24)
GFR, Estimated: >60 mL/min (ref >60)
Glucose, Bld: 110 mg/dL — ABNORMAL HIGH (ref 70–99)
Potassium: 4.2 mmol/L (ref 3.5–5.1)
Sodium: 140 mmol/L (ref 135–145)
Total Bilirubin: 0.5 mg/dL (ref 0.3–1.2)
Total Protein: 7.6 g/dL (ref 6.5–8.1)

## 2021-10-22 LAB — SEDIMENTATION RATE: Sed Rate: 22 mm/hr — ABNORMAL HIGH (ref 0–16)

## 2021-10-22 LAB — C-REACTIVE PROTEIN: CRP: 0.6 mg/dL (ref <1.0)

## 2021-10-22 MED ORDER — SODIUM CHLORIDE 0.9 % IV SOLN
800.0000 mg | INTRAVENOUS | Status: DC
  Filled 2021-10-22: qty 80

## 2021-10-22 MED ORDER — SODIUM CHLORIDE 0.9 % IV SOLN
10.0000 mg/kg | INTRAVENOUS | Status: DC
  Administered 2021-10-22: 1000 mg via INTRAVENOUS
  Filled 2021-10-22: qty 100

## 2021-10-22 MED ORDER — SODIUM CHLORIDE 0.9 % IV SOLN: INTRAVENOUS | Status: DC | PRN

## 2021-10-22 NOTE — Telephone Encounter (Signed)
Noted. ?Sending message to PCP, to have follow up in regards to BP at their convenience. ?Suspect it is a result (as he has had this previously), just due to the infusion, but may be worthwhile exploring if longer term BP control management may be needed. ?Thanks. ?GM ? ?FYI NPW. ?

## 2021-10-22 NOTE — Telephone Encounter (Signed)
Called left message to call back 

## 2021-10-22 NOTE — Progress Notes (Addendum)
PATIENT CARE CENTER NOTE ?  ?Diagnosis: Ulcerative colitis ?  ?  ?Provider: Justice Britain MD ?  ?  ?Procedure: Remicade 42m/kg + lab draw ?  ?  ?Note: Patient received Remicade  infusion ( does #5 of 6) via PIV. Labs collected per orders ( CBC w diff, CMP, CRP, and ESR). No premeds required per orders. Infusion titrated per protocol. Patient tolerated well with no adverse reaction. Vital signs stable; however BP remained elevated during infusion, BP 165/77 at end of treatment, pt voices no complaints. Dr. MEdwin CapRN Patty notified and states she will notify provider. Pt states he has a PCP visit in a few weeks, he will discuss concerns with BP at this time. Pt declined AVS. Patient to come back every 8 weeks for infusion, scheduled on 11/19/21 , pt aware. Alert, oriented and ambulatory at discharge.   ?

## 2021-10-22 NOTE — Telephone Encounter (Signed)
Patient called back, appt made for 04/10. ?

## 2021-10-28 ENCOUNTER — Ambulatory Visit (INDEPENDENT_AMBULATORY_CARE_PROVIDER_SITE_OTHER): Payer: Medicare Other | Admitting: Family Medicine

## 2021-10-28 ENCOUNTER — Encounter: Payer: Self-pay | Admitting: Family Medicine

## 2021-10-28 VITALS — BP 158/82 | HR 81 | Temp 98.6°F | Ht 71.0 in | Wt 219.1 lb

## 2021-10-28 DIAGNOSIS — I10 Essential (primary) hypertension: Secondary | ICD-10-CM

## 2021-10-28 MED ORDER — AMLODIPINE BESYLATE 5 MG PO TABS: 5.0000 mg | ORAL_TABLET | Freq: Every day | ORAL | 3 refills | Status: DC

## 2021-10-28 NOTE — Progress Notes (Signed)
Chief Complaint  ?Patient presents with  ? Follow-up  ?  Hypertension ?  ? ? ?Subjective ?Justin Rosales is a 71 y.o. male who presents for hypertension follow up. ?Has been high at specialist's office a few visits.  ?He does not monitor home blood pressures. ?He is not currently on any medication.  ?Was on Exforge and chlorthalidone in the past.  ?Diet worse lately due to improved gut health.  ?He is not always adhering to a healthy diet overall. ?Current exercise: walking, wt resistance exercise ?No Cp or SOB ?  ?Past Medical History:  ?Diagnosis Date  ? Allergy   ? Anemia   ? Arthritis   ? Blood transfusion without reported diagnosis   ? C. difficile diarrhea   ? DVT (deep venous thrombosis) ([place])   ? right  ? Elevated cholesterol   ? 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 Goals of care, counseling/discussion 08/28/2020  ? Hypertension   ? 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 Lower leg DVT (deep venous thromboembolism), acute, right ([place]) 09/28/2020  ? UC (ulcerative colitis) ([place])   ? ? ?Exam ?BP (!) 158/82   Pulse 81   Temp 98.6 ?F (37 ?C) (Oral)   Ht 5' 11"  (1.803 m)   Wt 219 lb 2 oz (99.4 kg)   SpO2 98%   BMI 30.56 kg/m?  ?General:  well developed, well nourished, in no apparent distress ?Heart: RRR, no bruits, no LE edema ?Lungs: clear to auscultation, no accessory muscle use ?Psych: well oriented with normal range of affect and appropriate judgment/insight ? ?Essential hypertension - Plan: amLODipine (NORVASC) 5 MG tablet ? ?Chronic, not controlled. Start norvasc 5 mg/d. Monitor BP at home. Counseled on diet and exercise. ?F/u in 1 mo. ?The patient voiced understanding and agreement to the plan. ? ?Shelda Pal, DO ?10/28/21  ?7:17 AM ? ?

## 2021-10-28 NOTE — Patient Instructions (Signed)
Keep the diet clean and stay active. ? ?Aim to do some physical exertion for 150 minutes per week. This is typically divided into 5 days per week, 30 minutes per day. The activity should be enough to get your heart rate up. Anything is better than nothing if you have time constraints. ? ?Check your blood pressures 2-3 times per week, alternating the time of day you check it. If it is high, considering waiting 1-2 minutes and rechecking. If it gets higher, your anxiety is likely creeping up and we should avoid rechecking.  ? ?Let us know if you need anything. ?

## 2021-11-05 ENCOUNTER — Other Ambulatory Visit: Payer: Self-pay | Admitting: Pharmacist

## 2021-11-06 ENCOUNTER — Ambulatory Visit: Payer: Medicare Other | Admitting: Family Medicine

## 2021-11-18 ENCOUNTER — Ambulatory Visit: Payer: Medicare Other | Admitting: Family Medicine

## 2021-11-19 ENCOUNTER — Non-Acute Institutional Stay (HOSPITAL_COMMUNITY)
Admission: RE | Admit: 2021-11-19 | Discharge: 2021-11-19 | Disposition: A | Discharge status: Home / Self Care | Payer: Medicare Other | Source: Ambulatory Visit | Attending: Internal Medicine | Admitting: Internal Medicine

## 2021-11-19 DIAGNOSIS — K50019 Crohn's disease of small intestine with unspecified complications: Secondary | ICD-10-CM | POA: Diagnosis present

## 2021-11-19 LAB — CBC WITH DIFFERENTIAL/PLATELET
Abs Immature Granulocytes: 0.01 10*3/uL (ref 0.00–0.07)
Basophils Absolute: 0 10*3/uL (ref 0.0–0.1)
Basophils Relative: 1 %
Eosinophils Absolute: 0.2 10*3/uL (ref 0.0–0.5)
Eosinophils Relative: 4 %
HCT: 37.4 % — ABNORMAL LOW (ref 39.0–52.0)
Hemoglobin: 12.5 g/dL — ABNORMAL LOW (ref 13.0–17.0)
Immature Granulocytes: 0 %
Lymphocytes Relative: 29 %
Lymphs Abs: 1 10*3/uL (ref 0.7–4.0)
MCH: 32.1 pg (ref 26.0–34.0)
MCHC: 33.4 g/dL (ref 30.0–36.0)
MCV: 96.1 fL (ref 80.0–100.0)
Monocytes Absolute: 0.3 10*3/uL (ref 0.1–1.0)
Monocytes Relative: 9 %
Neutro Abs: 2 10*3/uL (ref 1.7–7.7)
Neutrophils Relative %: 57 %
Platelets: 212 10*3/uL (ref 150–400)
RBC: 3.89 MIL/uL — ABNORMAL LOW (ref 4.22–5.81)
RDW: 15.6 % — ABNORMAL HIGH (ref 11.5–15.5)
WBC: 3.5 10*3/uL — ABNORMAL LOW (ref 4.0–10.5)
nRBC: 0 % (ref 0.0–0.2)

## 2021-11-19 LAB — SEDIMENTATION RATE: Sed Rate: 20 mm/hr — ABNORMAL HIGH (ref 0–16)

## 2021-11-19 LAB — COMPREHENSIVE METABOLIC PANEL
ALT: 21 U/L (ref 0–44)
AST: 41 U/L (ref 15–41)
Albumin: 3.9 g/dL (ref 3.5–5.0)
Alkaline Phosphatase: 83 U/L (ref 38–126)
Anion gap: 7 (ref 5–15)
BUN: 16 mg/dL (ref 8–23)
CO2: 26 mmol/L (ref 22–32)
Calcium: 9.1 mg/dL (ref 8.9–10.3)
Chloride: 107 mmol/L (ref 98–111)
Creatinine, Ser: 0.79 mg/dL (ref 0.61–1.24)
GFR, Estimated: >60 mL/min (ref >60)
Glucose, Bld: 108 mg/dL — ABNORMAL HIGH (ref 70–99)
Potassium: 4.7 mmol/L (ref 3.5–5.1)
Sodium: 140 mmol/L (ref 135–145)
Total Bilirubin: 1 mg/dL (ref 0.3–1.2)
Total Protein: 7.6 g/dL (ref 6.5–8.1)

## 2021-11-19 LAB — C-REACTIVE PROTEIN: CRP: <0.5 mg/dL (ref <1.0)

## 2021-11-19 MED ORDER — SODIUM CHLORIDE 0.9 % IV SOLN
1000.0000 mg | INTRAVENOUS | Status: DC
  Administered 2021-11-19: 1000 mg via INTRAVENOUS
  Filled 2021-11-19: qty 100

## 2021-11-19 MED ORDER — SODIUM CHLORIDE 0.9 % IV SOLN
INTRAVENOUS | Status: DC | PRN
Start: 2021-11-19 — End: 2021-11-20

## 2021-11-19 NOTE — Progress Notes (Signed)
Addendum to note from 10/22/2021, pt to RTC monthly not Q8 weeks. Entered in error.  ?

## 2021-11-19 NOTE — Progress Notes (Signed)
PATIENT CARE CENTER NOTE ?  ?Diagnosis: Ulcerative colitis ?  ?  ?Provider: Justice Britain MD ?  ?  ?Procedure: Remicade  74m/kg + lab draw ?  ?  ?Note: Patient received Remicade infusion ( dose # 6 of 6) via PIV. No premeds required per orders. Labs collected per orders ( CBC w diff, CMP, CRP, ESR). Infusion titrated per protocol. BP elevated throughout infusion, pt states he recently started on BP meds and has a follow up on 11/27/21 with provider, otherwise has no complaints. Dr. MSilvestre Momentaware of elevated BP during infusions, and aware that pt sees his PCP for this issue. Patient tolerated well with no adverse reaction. Vital signs stable. Pt declined AVS. Patient scheduled for next infusion on 5/30, pt aware. Alert, oriented and ambulatory at discharge.   ?

## 2021-11-21 ENCOUNTER — Other Ambulatory Visit: Payer: Self-pay | Admitting: Hematology & Oncology

## 2021-11-27 ENCOUNTER — Ambulatory Visit (INDEPENDENT_AMBULATORY_CARE_PROVIDER_SITE_OTHER): Payer: Medicare Other | Admitting: Family Medicine

## 2021-11-27 ENCOUNTER — Other Ambulatory Visit: Payer: Self-pay

## 2021-11-27 ENCOUNTER — Encounter: Payer: Self-pay | Admitting: Family Medicine

## 2021-11-27 VITALS — BP 152/78 | HR 82 | Temp 97.9°F | Ht 70.0 in | Wt 215.2 lb

## 2021-11-27 DIAGNOSIS — K51018 Ulcerative (chronic) pancolitis with other complication: Secondary | ICD-10-CM

## 2021-11-27 DIAGNOSIS — I10 Essential (primary) hypertension: Secondary | ICD-10-CM | POA: Diagnosis not present

## 2021-11-27 DIAGNOSIS — I700 Atherosclerosis of aorta: Secondary | ICD-10-CM | POA: Diagnosis not present

## 2021-11-27 MED ORDER — AMLODIPINE BESYLATE-VALSARTAN 5-160 MG PO TABS: 1.0000 | ORAL_TABLET | Freq: Every day | ORAL | 2 refills | Status: DC

## 2021-11-27 NOTE — Progress Notes (Signed)
Chief Complaint  ?Patient presents with  ? Follow-up  ? ? ?Subjective ?Justin Rosales is a 71 y.o. male who presents for hypertension follow up. ?He does monitor home blood pressures. ?Blood pressures ranging from 140-160's/70-80's on average. ?He is compliant with medication- started on Norvasc 5 mg/d 1 mo ago. ?Patient has these side effects of medication: none ?He is usually adhering to a healthy diet overall. ?Current exercise: walking, golf ?No Cp or SOB.  ?  ?Past Medical History:  ?Diagnosis Date  ? Allergy   ? Anemia   ? Arthritis   ? Blood transfusion without reported diagnosis   ? C. difficile diarrhea   ? DVT (deep venous thrombosis) ([place])   ? right  ? Elevated cholesterol   ? 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 Goals of care, counseling/discussion 08/28/2020  ? Hypertension   ? 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 Lower leg DVT (deep venous thromboembolism), acute, right ([place]) 09/28/2020  ? UC (ulcerative colitis) ([place])   ? ? ?Exam ?BP (!) 152/78 (BP Location: Left Arm, Cuff Size: Normal)   Pulse 82   Temp 97.9 ?F (36.6 ?C) (Oral)   Ht 5' 10"  (1.778 m)   Wt 215 lb 4 oz (97.6 kg)   SpO2 98%   BMI 30.89 kg/m?  ?General:  well developed, well nourished, in no apparent distress ?Heart: RRR, no bruits, no LE edema ?Lungs: clear to auscultation, no accessory muscle use ?Psych: well oriented with normal range of affect and appropriate judgment/insight ? ?Essential hypertension - Plan: amLODipine-valsartan (EXFORGE) 5-160 MG tablet, Basic metabolic panel ? ?Aortic atherosclerosis ([place]), Chronic ? ?Chronic, uncontrolled. Counseled on diet and exercise. Add valsartan 160 mg to amlodipine in combo pill. Reck BMP in 1 week. He will monitor BP at home. If not controlled, I will see him in 1 mo. I think he is close to goal. F/u in 6 mo pending BP. ?The patient voiced understanding and agreement to the plan. ? ?Shelda Pal,  DO ?11/27/21  ?7:11 AM ? ?

## 2021-11-27 NOTE — Patient Instructions (Addendum)
Keep the diet clean and stay active. ? ?Keep checking your blood pressure at home. If <150/90 consistently in the next 1-2 months, we can stop checking. If not, we should reconvene in about a month.  ? ?Let us know if you need anything. ?

## 2021-12-04 ENCOUNTER — Other Ambulatory Visit (INDEPENDENT_AMBULATORY_CARE_PROVIDER_SITE_OTHER): Payer: Medicare Other

## 2021-12-04 DIAGNOSIS — I10 Essential (primary) hypertension: Secondary | ICD-10-CM

## 2021-12-04 LAB — BASIC METABOLIC PANEL
BUN: 19 mg/dL (ref 6–23)
CO2: 28 mEq/L (ref 19–32)
Calcium: 8.9 mg/dL (ref 8.4–10.5)
Chloride: 105 mEq/L (ref 96–112)
Creatinine, Ser: 0.93 mg/dL (ref 0.40–1.50)
GFR: 83.08 mL/min (ref >60.00)
Glucose, Bld: 105 mg/dL — ABNORMAL HIGH (ref 70–99)
Potassium: 4.8 mEq/L (ref 3.5–5.1)
Sodium: 140 mEq/L (ref 135–145)

## 2021-12-17 ENCOUNTER — Non-Acute Institutional Stay (HOSPITAL_COMMUNITY)
Admission: RE | Admit: 2021-12-17 | Discharge: 2021-12-17 | Disposition: A | Discharge status: Home / Self Care | Payer: Medicare Other | Source: Ambulatory Visit | Attending: Internal Medicine | Admitting: Internal Medicine

## 2021-12-17 DIAGNOSIS — K51019 Ulcerative (chronic) pancolitis with unspecified complications: Secondary | ICD-10-CM | POA: Insufficient documentation

## 2021-12-17 DIAGNOSIS — K51018 Ulcerative (chronic) pancolitis with other complication: Secondary | ICD-10-CM

## 2021-12-17 MED ORDER — SODIUM CHLORIDE 0.9 % IV SOLN
INTRAVENOUS | Status: DC | PRN
Start: 2021-12-17 — End: 2021-12-18

## 2021-12-17 MED ORDER — SODIUM CHLORIDE 0.9 % IV SOLN
10.0000 mg/kg | INTRAVENOUS | Status: DC
  Administered 2021-12-17: 1000 mg via INTRAVENOUS
  Filled 2021-12-17: qty 100

## 2021-12-17 NOTE — Progress Notes (Signed)
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 No premeds required per orders. Labs collected earlier this month ( CBC w diff, CMP, CRP, ESR) so no labs collected today. Per Patty Phelps, RN, labs are to be collected with each monthly infusion. Infusion titrated per protocol. Patient tolerated well with no adverse reaction. Vital signs stable. Pt declined AVS. Patient scheduled for next infusion on 01/14/22. Alert, oriented and ambulatory at discharge.   

## 2021-12-19 ENCOUNTER — Inpatient Hospital Stay (HOSPITAL_BASED_OUTPATIENT_CLINIC_OR_DEPARTMENT_OTHER): Payer: Medicare Other | Admitting: Hematology & Oncology

## 2021-12-19 ENCOUNTER — Inpatient Hospital Stay: Payer: Medicare Other

## 2021-12-19 ENCOUNTER — Encounter: Payer: Self-pay | Admitting: Hematology & Oncology

## 2021-12-19 ENCOUNTER — Inpatient Hospital Stay: Payer: Medicare Other | Attending: Hematology & Oncology

## 2021-12-19 ENCOUNTER — Telehealth: Payer: Self-pay

## 2021-12-19 VITALS — BP 160/74 | HR 77 | Temp 98.2°F | Resp 17 | Wt 215.4 lb

## 2021-12-19 DIAGNOSIS — I82431 Acute embolism and thrombosis of right popliteal vein: Secondary | ICD-10-CM | POA: Insufficient documentation

## 2021-12-19 DIAGNOSIS — K51911 Ulcerative colitis, unspecified with rectal bleeding: Secondary | ICD-10-CM | POA: Insufficient documentation

## 2021-12-19 DIAGNOSIS — K909 Intestinal malabsorption, unspecified: Secondary | ICD-10-CM | POA: Diagnosis not present

## 2021-12-19 DIAGNOSIS — D500 Iron deficiency anemia secondary to blood loss (chronic): Secondary | ICD-10-CM | POA: Insufficient documentation

## 2021-12-19 LAB — CBC WITH DIFFERENTIAL (CANCER CENTER ONLY)
Abs Immature Granulocytes: 0.01 10*3/uL (ref 0.00–0.07)
Basophils Absolute: 0 10*3/uL (ref 0.0–0.1)
Basophils Relative: 1 %
Eosinophils Absolute: 0.3 10*3/uL (ref 0.0–0.5)
Eosinophils Relative: 7 %
HCT: 38.6 % — ABNORMAL LOW (ref 39.0–52.0)
Hemoglobin: 12.5 g/dL — ABNORMAL LOW (ref 13.0–17.0)
Immature Granulocytes: 0 %
Lymphocytes Relative: 41 %
Lymphs Abs: 1.6 10*3/uL (ref 0.7–4.0)
MCH: 31.4 pg (ref 26.0–34.0)
MCHC: 32.4 g/dL (ref 30.0–36.0)
MCV: 97 fL (ref 80.0–100.0)
Monocytes Absolute: 0.4 10*3/uL (ref 0.1–1.0)
Monocytes Relative: 10 %
Neutro Abs: 1.6 10*3/uL — ABNORMAL LOW (ref 1.7–7.7)
Neutrophils Relative %: 41 %
Platelet Count: 229 10*3/uL (ref 150–400)
RBC: 3.98 MIL/uL — ABNORMAL LOW (ref 4.22–5.81)
RDW: 15 % (ref 11.5–15.5)
WBC Count: 4 10*3/uL (ref 4.0–10.5)
nRBC: 0 % (ref 0.0–0.2)

## 2021-12-19 LAB — IRON AND IRON BINDING CAPACITY (CC-WL,HP ONLY)
Iron: 70 ug/dL (ref 45–182)
Saturation Ratios: 18 % (ref 17.9–39.5)
TIBC: 398 ug/dL (ref 250–450)
UIBC: 328 ug/dL (ref 117–376)

## 2021-12-19 LAB — CMP (CANCER CENTER ONLY)
ALT: 22 U/L (ref 0–44)
AST: 24 U/L (ref 15–41)
Albumin: 4.3 g/dL (ref 3.5–5.0)
Alkaline Phosphatase: 80 U/L (ref 38–126)
Anion gap: 6 (ref 5–15)
BUN: 25 mg/dL — ABNORMAL HIGH (ref 8–23)
CO2: 28 mmol/L (ref 22–32)
Calcium: 9.5 mg/dL (ref 8.9–10.3)
Chloride: 106 mmol/L (ref 98–111)
Creatinine: 0.94 mg/dL (ref 0.61–1.24)
GFR, Estimated: >60 mL/min (ref >60)
Glucose, Bld: 101 mg/dL — ABNORMAL HIGH (ref 70–99)
Potassium: 4.7 mmol/L (ref 3.5–5.1)
Sodium: 140 mmol/L (ref 135–145)
Total Bilirubin: 0.4 mg/dL (ref 0.3–1.2)
Total Protein: 7.4 g/dL (ref 6.5–8.1)

## 2021-12-19 LAB — RETICULOCYTES
Immature Retic Fract: 15.2 % (ref 2.3–15.9)
RBC.: 3.92 MIL/uL — ABNORMAL LOW (ref 4.22–5.81)
Retic Count, Absolute: 64.3 10*3/uL (ref 19.0–186.0)
Retic Ct Pct: 1.6 % (ref 0.4–3.1)

## 2021-12-19 LAB — FERRITIN: Ferritin: 19 ng/mL — ABNORMAL LOW (ref 24–336)

## 2021-12-19 NOTE — Telephone Encounter (Signed)
Called and informed patient of lab results, patient verbalized understanding and denies any questions or concerns at this time. Message sent to schedulers to contact pt and schedule IV iron apt.

## 2021-12-19 NOTE — Progress Notes (Signed)
Hematology and Oncology Follow Up Visit  Carmine Carrozza [id number] 02-22-1951 71 y.o. 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 twice daily-to complete 6 months of therapy in June 2022 Retacrit 40,000 units subcu weekly for Hgb <11     Interim History:  Mr. Mcaulay is back for follow-up.  We last saw him back in March.  Since then, has been doing pretty well.  We saw him back in March, his iron saturation was 20%.  We did not give him any Monoferric.  He gets the Remicade monthly.  He says he feels a little bit rough the day afterwards.  He has had no bleeding or diarrhea.  He has had no abdominal pain.  He has had no cough or shortness of breath.  Is been no leg swelling.  He has been off Eliquis since June of last year.  Currently, I would say his performance status is probably ECOG 0.     Medications:  Current Outpatient Medications:    amLODipine-valsartan (EXFORGE) 5-160 MG tablet, Take 1 tablet by mouth daily., Disp: 30 tablet, Rfl: 2   aspirin 81 MG EC tablet, Take 1 tablet (81 mg total) by mouth daily. Swallow whole., Disp: 30 tablet, Rfl: 12   Bepotastine Besilate 1.5 % SOLN, 1 drop 2 (two) times daily., Disp: , Rfl:    Cholecalciferol (DIALYVITE VITAMIN D 5000) 125 MCG (5000 UT) capsule, Take 5,000 Units by mouth daily., Disp: , Rfl:    diphenhydrAMINE HCl, Sleep, (ZZZQUIL PO), Take 10 mLs by mouth daily as needed (sleep)., Disp: , Rfl:    folic acid (FOLVITE) 1 MG tablet, TAKE 2 TABLETS BY MOUTH  DAILY, Disp: 240 tablet, Rfl: 2   inFLIXimab in sodium chloride 0.9 %, Inject into the vein. 01/16/2021 To start taking every 4 weeks., Disp: , Rfl:    mercaptopurine (PURINETHOL) 50 MG tablet, TAKE 1 AND 1/2 TABLETS BY  MOUTH DAILY,  Disp: 180 tablet, Rfl: 3   Multiple Vitamin (MULTIVITAMIN WITH MINERALS) TABS tablet, Take 1 tablet by mouth daily., Disp: , Rfl:    NON FORMULARY, Take 1 tablet by mouth daily. Vital reds-energy supplement, Disp: , Rfl:    rosuvastatin (CRESTOR) 20 MG tablet, Take 1 tablet (20 mg total) by mouth daily., Disp: 90 tablet, Rfl: 3   Saccharomyces boulardii (FLORASTOR PO), Take 1 capsule by mouth in the morning and at bedtime., Disp: , Rfl:    sildenafil (VIAGRA) 100 MG tablet, Take 0.5-1 tablets (50-100 mg total) by mouth daily as needed for erectile dysfunction., Disp: 30 tablet, Rfl: 2  Allergies: No Known Allergies  Past Medical History, Surgical history, Social history, and Family History were reviewed and updated.  Review of Systems: Review of Systems  Constitutional:  Positive for fatigue.  HENT:  Negative.    Eyes: Negative.   Respiratory:  Positive for shortness of breath.   Cardiovascular: Negative.   Gastrointestinal:  Positive for blood in stool and diarrhea.  Endocrine: Negative.   Genitourinary: Negative.    Musculoskeletal: Negative.   Skin: Negative.   Neurological: Negative.   Hematological: Negative.   Psychiatric/Behavioral: Negative.     Physical Exam:  weight is 215 lb 6.4 oz (97.7 kg). His oral temperature is 98.2 F (36.8 C). His blood pressure is 160/74 (abnormal)  and his pulse is 77. His respiration is 17 and oxygen saturation is 98%.   Wt Readings from Last 3 Encounters:  12/19/21 215 lb 6.4 oz (97.7 kg)  12/17/21 214 lb (97.1 kg)  11/27/21 215 lb 4 oz (97.6 kg)    Physical Exam Vitals reviewed.  HENT:     Head: Normocephalic and atraumatic.  Eyes:     Pupils: Pupils are equal, round, and reactive to light.  Cardiovascular:     Rate and Rhythm: Normal rate and regular rhythm.     Heart sounds: Normal heart sounds.  Pulmonary:     Effort: Pulmonary effort is normal.     Breath sounds: Normal breath sounds.  Abdominal:     General: Bowel sounds  are normal.     Palpations: Abdomen is soft.  Musculoskeletal:        General: No tenderness or deformity. Normal range of motion.     Cervical back: Normal range of motion.  Lymphadenopathy:     Cervical: No cervical adenopathy.  Skin:    General: Skin is warm and dry.     Findings: No erythema or rash.  Neurological:     Mental Status: He is alert and oriented to person, place, and time.  Psychiatric:        Behavior: Behavior normal.        Thought Content: Thought content normal.        Judgment: Judgment normal.   Lab Results  Component Value Date   WBC 4.0 12/19/2021   HGB 12.5 (L) 12/19/2021   HCT 38.6 (L) 12/19/2021   MCV 97.0 12/19/2021   PLT 229 12/19/2021     Chemistry      Component Value Date/Time   NA 140 12/19/2021 0906   K 4.7 12/19/2021 0906   CL 106 12/19/2021 0906   CO2 28 12/19/2021 0906   BUN 25 (H) 12/19/2021 0906   CREATININE 0.94 12/19/2021 0906      Component Value Date/Time   CALCIUM 9.5 12/19/2021 0906   ALKPHOS 80 12/19/2021 0906   AST 24 12/19/2021 0906   ALT 22 12/19/2021 0906   BILITOT 0.4 12/19/2021 0906       Impression and Plan: Mr. Lynam is a very nice 71 year old white male.  He has ulcerative colitis.  He had Clostridium diarrhea.  He became profoundly iron deficient.    We will see what his iron studies look like.  His hemoglobin is holding steady.  The MCV looks pretty good so I would like to hope that his iron studies should be okay.  He does not need any Retacrit.  Again he is planning on going down to [place] at the end of September.  As such, we will get him back here after Labor Day to make sure that his blood is okay and make sure that we optimize his blood count.     [name], MD 6/1/202310:38 AM

## 2021-12-19 NOTE — Telephone Encounter (Signed)
-----   Message from [name], MD sent at 12/19/2021  4:21 PM EDT ----- Call - the iron level is actully a little low.  He will benefit from 1 dose of IV iron!!  Justin Rosales

## 2021-12-25 ENCOUNTER — Inpatient Hospital Stay: Payer: Medicare Other

## 2021-12-25 VITALS — BP 122/60 | HR 72 | Temp 98.7°F | Resp 18

## 2021-12-25 DIAGNOSIS — D631 Anemia in chronic kidney disease: Secondary | ICD-10-CM

## 2021-12-25 DIAGNOSIS — D500 Iron deficiency anemia secondary to blood loss (chronic): Secondary | ICD-10-CM | POA: Diagnosis not present

## 2021-12-25 DIAGNOSIS — D508 Other iron deficiency anemias: Secondary | ICD-10-CM

## 2021-12-25 DIAGNOSIS — K909 Intestinal malabsorption, unspecified: Secondary | ICD-10-CM

## 2021-12-25 MED ORDER — SODIUM CHLORIDE 0.9 % IV SOLN
1000.0000 mg | Freq: Once | INTRAVENOUS | Status: AC
  Administered 2021-12-25: 1000 mg via INTRAVENOUS
  Filled 2021-12-25: qty 10

## 2021-12-25 MED ORDER — SODIUM CHLORIDE 0.9 % IV SOLN: Freq: Once | INTRAVENOUS | Status: AC

## 2021-12-25 NOTE — Patient Instructions (Signed)
Ferric Derisomaltose Injection What is this medication? FERRIC DERISOMALTOSE (FER ik der EYE soe MAWL tose) treats low levels of iron in your body (iron deficiency anemia). Iron is a mineral that plays an important role in making red blood cells, which carry oxygen from your lungs to the rest of your body. This medicine may be used for other purposes; ask your health care provider or pharmacist if you have questions. COMMON BRAND NAME(S): MONOFERRIC What should I tell my care team before I take this medication? They need to know if you have any of these conditions: High levels of iron in the blood An unusual or allergic reaction to iron, other medications, foods, dyes, or preservatives Pregnant or trying to get pregnant Breast-feeding How should I use this medication? This medication is for injection into a vein. It is given in a hospital or clinic setting. Talk to your care team about the use of this medication in children. Special care may be needed. Overdosage: If you think you have taken too much of this medicine contact a poison control center or emergency room at once. NOTE: This medicine is only for you. Do not share this medicine with others. What if I miss a dose? It is important not to miss your dose. Call your care team if you are unable to keep an appointment. What may interact with this medication? Do not take this medication with any of the following: Deferoxamine Dimercaprol Other iron products This list may not describe all possible interactions. Give your health care provider a list of all the medicines, herbs, non-prescription drugs, or dietary supplements you use. Also tell them if you smoke, drink alcohol, or use illegal drugs. Some items may interact with your medicine. What should I watch for while using this medication? Visit your care team regularly. Tell your care team if your symptoms do not start to get better or if they get worse. You may need blood work done  while you are taking this medication. You may need to follow a special diet. Talk to your care team. Foods that contain iron include whole grains/cereals, dried fruits, beans, or peas, leafy green vegetables, and organ meats (liver, kidney). What side effects may I notice from receiving this medication? Side effects that you should report to your care team as soon as possible: Allergic reactions--skin rash, itching, hives, swelling of the face, lips, tongue, or throat Low blood pressure--dizziness, feeling faint or lightheaded, blurry vision Shortness of breath Side effects that usually do not require medical attention (report to your care team if they continue or are bothersome): Flushing Headache Joint pain Muscle pain Nausea Pain, redness, or irritation at injection site This list may not describe all possible side effects. Call your doctor for medical advice about side effects. You may report side effects to FDA at 1-800-FDA-1088. Where should I keep my medication? This medication is given in a hospital or clinic and will not be stored at home. NOTE: This sheet is a summary. It may not cover all possible information. If you have questions about this medicine, talk to your doctor, pharmacist, or health care provider.  2023 Elsevier/Gold Standard (2020-11-30 00:00:00)

## 2021-12-25 NOTE — Progress Notes (Signed)
Patient refused to wait 30 minutes post infusion. Released stable and ASX.

## 2022-01-14 ENCOUNTER — Non-Acute Institutional Stay (HOSPITAL_COMMUNITY)
Admission: RE | Admit: 2022-01-14 | Discharge: 2022-01-14 | Disposition: A | Discharge status: Home / Self Care | Payer: Medicare Other | Source: Ambulatory Visit | Attending: Internal Medicine | Admitting: Internal Medicine

## 2022-01-14 VITALS — BP 138/70 | HR 64 | Temp 97.4°F | Resp 16 | Wt 219.0 lb

## 2022-01-14 DIAGNOSIS — K51018 Ulcerative (chronic) pancolitis with other complication: Secondary | ICD-10-CM | POA: Insufficient documentation

## 2022-01-14 DIAGNOSIS — K51919 Ulcerative colitis, unspecified with unspecified complications: Secondary | ICD-10-CM

## 2022-01-14 LAB — COMPREHENSIVE METABOLIC PANEL
ALT: 26 U/L (ref 0–44)
AST: 24 U/L (ref 15–41)
Albumin: 3.9 g/dL (ref 3.5–5.0)
Alkaline Phosphatase: 75 U/L (ref 38–126)
Anion gap: 7 (ref 5–15)
BUN: 20 mg/dL (ref 8–23)
CO2: 25 mmol/L (ref 22–32)
Calcium: 9.2 mg/dL (ref 8.9–10.3)
Chloride: 108 mmol/L (ref 98–111)
Creatinine, Ser: 0.79 mg/dL (ref 0.61–1.24)
GFR, Estimated: >60 mL/min (ref >60)
Glucose, Bld: 104 mg/dL — ABNORMAL HIGH (ref 70–99)
Potassium: 4.2 mmol/L (ref 3.5–5.1)
Sodium: 140 mmol/L (ref 135–145)
Total Bilirubin: 0.7 mg/dL (ref 0.3–1.2)
Total Protein: 7.4 g/dL (ref 6.5–8.1)

## 2022-01-14 LAB — CBC WITH DIFFERENTIAL/PLATELET
Abs Immature Granulocytes: 0.01 10*3/uL (ref 0.00–0.07)
Basophils Absolute: 0 10*3/uL (ref 0.0–0.1)
Basophils Relative: 1 %
Eosinophils Absolute: 0.3 10*3/uL (ref 0.0–0.5)
Eosinophils Relative: 8 %
HCT: 39.1 % (ref 39.0–52.0)
Hemoglobin: 12.8 g/dL — ABNORMAL LOW (ref 13.0–17.0)
Immature Granulocytes: 0 %
Lymphocytes Relative: 33 %
Lymphs Abs: 1.3 10*3/uL (ref 0.7–4.0)
MCH: 31.8 pg (ref 26.0–34.0)
MCHC: 32.7 g/dL (ref 30.0–36.0)
MCV: 97.3 fL (ref 80.0–100.0)
Monocytes Absolute: 0.4 10*3/uL (ref 0.1–1.0)
Monocytes Relative: 11 %
Neutro Abs: 1.9 10*3/uL (ref 1.7–7.7)
Neutrophils Relative %: 47 %
Platelets: 211 10*3/uL (ref 150–400)
RBC: 4.02 MIL/uL — ABNORMAL LOW (ref 4.22–5.81)
RDW: 15.4 % (ref 11.5–15.5)
WBC: 3.9 10*3/uL — ABNORMAL LOW (ref 4.0–10.5)
nRBC: 0 % (ref 0.0–0.2)

## 2022-01-14 LAB — SEDIMENTATION RATE: Sed Rate: 21 mm/hr — ABNORMAL HIGH (ref 0–16)

## 2022-01-14 LAB — C-REACTIVE PROTEIN: CRP: 0.6 mg/dL (ref <1.0)

## 2022-01-14 MED ORDER — SODIUM CHLORIDE 0.9 % IV SOLN
10.0000 mg/kg | INTRAVENOUS | Status: DC
  Administered 2022-01-14: 1000 mg via INTRAVENOUS
  Filled 2022-01-14: qty 100

## 2022-01-14 MED ORDER — SODIUM CHLORIDE 0.9 % IV SOLN: INTRAVENOUS | Status: DC | PRN

## 2022-01-25 ENCOUNTER — Encounter: Payer: Self-pay | Admitting: Gastroenterology

## 2022-01-27 NOTE — Telephone Encounter (Signed)
Dr Rush Landmark please see the pt question about his infusion.  I have checked and he is in for recall in November.

## 2022-01-29 NOTE — Telephone Encounter (Signed)
Patty, I am okay with this. Colonoscopy at some point this year sounds good. Lets just have him on the list for [place] to get this done this year. Thanks. GM

## 2022-02-11 ENCOUNTER — Non-Acute Institutional Stay (HOSPITAL_COMMUNITY)
Admission: RE | Admit: 2022-02-11 | Discharge: 2022-02-11 | Disposition: A | Discharge status: Home / Self Care | Payer: Medicare Other | Source: Ambulatory Visit | Attending: Internal Medicine | Admitting: Internal Medicine

## 2022-02-11 VITALS — BP 140/70 | HR 67 | Temp 97.4°F | Resp 16

## 2022-02-11 DIAGNOSIS — K51019 Ulcerative (chronic) pancolitis with unspecified complications: Secondary | ICD-10-CM

## 2022-02-11 DIAGNOSIS — K51018 Ulcerative (chronic) pancolitis with other complication: Secondary | ICD-10-CM | POA: Diagnosis present

## 2022-02-11 DIAGNOSIS — K51819 Other ulcerative colitis with unspecified complications: Secondary | ICD-10-CM

## 2022-02-11 LAB — COMPREHENSIVE METABOLIC PANEL
ALT: 23 U/L (ref 0–44)
AST: 20 U/L (ref 15–41)
Albumin: 3.9 g/dL (ref 3.5–5.0)
Alkaline Phosphatase: 78 U/L (ref 38–126)
Anion gap: 7 (ref 5–15)
BUN: 17 mg/dL (ref 8–23)
CO2: 24 mmol/L (ref 22–32)
Calcium: 9.1 mg/dL (ref 8.9–10.3)
Chloride: 106 mmol/L (ref 98–111)
Creatinine, Ser: 0.75 mg/dL (ref 0.61–1.24)
GFR, Estimated: >60 mL/min (ref >60)
Glucose, Bld: 103 mg/dL — ABNORMAL HIGH (ref 70–99)
Potassium: 4.1 mmol/L (ref 3.5–5.1)
Sodium: 137 mmol/L (ref 135–145)
Total Bilirubin: 0.5 mg/dL (ref 0.3–1.2)
Total Protein: 7.2 g/dL (ref 6.5–8.1)

## 2022-02-11 LAB — CBC WITH DIFFERENTIAL/PLATELET
Abs Immature Granulocytes: 0.01 10*3/uL (ref 0.00–0.07)
Basophils Absolute: 0 10*3/uL (ref 0.0–0.1)
Basophils Relative: 1 %
Eosinophils Absolute: 0.3 10*3/uL (ref 0.0–0.5)
Eosinophils Relative: 5 %
HCT: 40 % (ref 39.0–52.0)
Hemoglobin: 13.1 g/dL (ref 13.0–17.0)
Immature Granulocytes: 0 %
Lymphocytes Relative: 28 %
Lymphs Abs: 1.4 10*3/uL (ref 0.7–4.0)
MCH: 32 pg (ref 26.0–34.0)
MCHC: 32.8 g/dL (ref 30.0–36.0)
MCV: 97.6 fL (ref 80.0–100.0)
Monocytes Absolute: 0.5 10*3/uL (ref 0.1–1.0)
Monocytes Relative: 11 %
Neutro Abs: 2.7 10*3/uL (ref 1.7–7.7)
Neutrophils Relative %: 55 %
Platelets: 210 10*3/uL (ref 150–400)
RBC: 4.1 MIL/uL — ABNORMAL LOW (ref 4.22–5.81)
RDW: 14.8 % (ref 11.5–15.5)
WBC: 4.9 10*3/uL (ref 4.0–10.5)
nRBC: 0 % (ref 0.0–0.2)

## 2022-02-11 LAB — C-REACTIVE PROTEIN: CRP: 0.5 mg/dL (ref <1.0)

## 2022-02-11 LAB — SEDIMENTATION RATE: Sed Rate: 17 mm/hr — ABNORMAL HIGH (ref 0–16)

## 2022-02-11 MED ORDER — SODIUM CHLORIDE 0.9 % IV SOLN: INTRAVENOUS | Status: DC | PRN

## 2022-02-11 MED ORDER — SODIUM CHLORIDE 0.9 % IV SOLN
1000.0000 mg | INTRAVENOUS | Status: DC
  Administered 2022-02-11: 1000 mg via INTRAVENOUS
  Filled 2022-02-11: qty 100

## 2022-02-11 NOTE — Progress Notes (Signed)
PATIENT CARE CENTER NOTE     Diagnosis: Ulcerative pancolitis with other complication ([place]) (E68.548)     Provider: Justice Britain MD     Procedure: Remicade  61m/kg     Note: Patient received Remicade infusion ( dose # 3 of 11) via PIV. No premeds required per orders. Labs drawn ( CBC w diff, CMP, CRP, ESR). Per PKoren Shiver RN, labs are to be collected with each monthly infusion. Infusion titrated per protocol. Patient tolerated well with no adverse reaction. Vital signs stable. Pt declined AVS. Patient scheduled for next infusion on 03/11/22. Alert, oriented and ambulatory at discharge.

## 2022-02-13 ENCOUNTER — Encounter: Payer: Self-pay | Admitting: Gastroenterology

## 2022-02-23 ENCOUNTER — Other Ambulatory Visit: Payer: Self-pay | Admitting: Family Medicine

## 2022-02-23 DIAGNOSIS — I10 Essential (primary) hypertension: Secondary | ICD-10-CM

## 2022-03-03 ENCOUNTER — Ambulatory Visit: Payer: Medicare Other | Admitting: Family Medicine

## 2022-03-11 ENCOUNTER — Non-Acute Institutional Stay (HOSPITAL_COMMUNITY)
Admission: RE | Admit: 2022-03-11 | Discharge: 2022-03-11 | Disposition: A | Discharge status: Home / Self Care | Payer: Medicare Other | Source: Ambulatory Visit | Attending: Internal Medicine | Admitting: Internal Medicine

## 2022-03-11 VITALS — BP 136/71 | HR 70 | Temp 97.5°F | Resp 16 | Wt 220.0 lb

## 2022-03-11 DIAGNOSIS — K51919 Ulcerative colitis, unspecified with unspecified complications: Secondary | ICD-10-CM

## 2022-03-11 DIAGNOSIS — K51018 Ulcerative (chronic) pancolitis with other complication: Secondary | ICD-10-CM | POA: Insufficient documentation

## 2022-03-11 LAB — CBC WITH DIFFERENTIAL/PLATELET
Abs Immature Granulocytes: 0 10*3/uL (ref 0.00–0.07)
Basophils Absolute: 0 10*3/uL (ref 0.0–0.1)
Basophils Relative: 1 %
Eosinophils Absolute: 0.2 10*3/uL (ref 0.0–0.5)
Eosinophils Relative: 7 %
HCT: 36.5 % — ABNORMAL LOW (ref 39.0–52.0)
Hemoglobin: 12.1 g/dL — ABNORMAL LOW (ref 13.0–17.0)
Immature Granulocytes: 0 %
Lymphocytes Relative: 34 %
Lymphs Abs: 1 10*3/uL (ref 0.7–4.0)
MCH: 33.2 pg (ref 26.0–34.0)
MCHC: 33.2 g/dL (ref 30.0–36.0)
MCV: 100 fL (ref 80.0–100.0)
Monocytes Absolute: 0.4 10*3/uL (ref 0.1–1.0)
Monocytes Relative: 12 %
Neutro Abs: 1.5 10*3/uL — ABNORMAL LOW (ref 1.7–7.7)
Neutrophils Relative %: 46 %
Platelets: 204 10*3/uL (ref 150–400)
RBC: 3.65 MIL/uL — ABNORMAL LOW (ref 4.22–5.81)
RDW: 15.1 % (ref 11.5–15.5)
WBC: 3.1 10*3/uL — ABNORMAL LOW (ref 4.0–10.5)
nRBC: 0 % (ref 0.0–0.2)

## 2022-03-11 LAB — COMPREHENSIVE METABOLIC PANEL
ALT: 24 U/L (ref 0–44)
AST: 30 U/L (ref 15–41)
Albumin: 3.8 g/dL (ref 3.5–5.0)
Alkaline Phosphatase: 70 U/L (ref 38–126)
Anion gap: 4 — ABNORMAL LOW (ref 5–15)
BUN: 20 mg/dL (ref 8–23)
CO2: 25 mmol/L (ref 22–32)
Calcium: 8.6 mg/dL — ABNORMAL LOW (ref 8.9–10.3)
Chloride: 111 mmol/L (ref 98–111)
Creatinine, Ser: 0.97 mg/dL (ref 0.61–1.24)
GFR, Estimated: >60 mL/min (ref >60)
Glucose, Bld: 115 mg/dL — ABNORMAL HIGH (ref 70–99)
Potassium: 4.6 mmol/L (ref 3.5–5.1)
Sodium: 140 mmol/L (ref 135–145)
Total Bilirubin: 0.8 mg/dL (ref 0.3–1.2)
Total Protein: 7 g/dL (ref 6.5–8.1)

## 2022-03-11 LAB — SEDIMENTATION RATE: Sed Rate: 14 mm/hr (ref 0–16)

## 2022-03-11 LAB — C-REACTIVE PROTEIN: CRP: 0.6 mg/dL (ref <1.0)

## 2022-03-11 MED ORDER — SODIUM CHLORIDE 0.9 % IV SOLN: INTRAVENOUS | Status: DC | PRN

## 2022-03-11 MED ORDER — SODIUM CHLORIDE 0.9 % IV SOLN
10.0000 mg/kg | INTRAVENOUS | Status: DC
  Administered 2022-03-11: 1000 mg via INTRAVENOUS
  Filled 2022-03-11: qty 100

## 2022-03-11 NOTE — Progress Notes (Signed)
PATIENT CARE CENTER NOTE     Diagnosis: Ulcerative pancolitis with other complication ([place]) (E40.335)     Provider: Justice Britain MD     Procedure: Remicade  50m/kg     Note: Patient received Remicade infusion ( dose # 4 of 11) via PIV. No premeds required per orders. Labs drawn ( CBC w diff, CMP, CRP, ESR). Per PKoren Shiver RN, labs are to be collected with each monthly infusion. Infusion titrated per protocol. Patient tolerated well with no adverse reaction. Vital signs stable. Pt declined AVS. Patient scheduled for next infusion on 04/22/22. Alert, oriented and ambulatory at discharge.

## 2022-03-12 ENCOUNTER — Encounter: Payer: Self-pay | Admitting: Family Medicine

## 2022-03-12 ENCOUNTER — Ambulatory Visit (INDEPENDENT_AMBULATORY_CARE_PROVIDER_SITE_OTHER): Payer: Medicare Other | Admitting: Family Medicine

## 2022-03-12 VITALS — BP 134/78 | HR 73 | Temp 98.4°F | Ht 70.0 in | Wt 219.5 lb

## 2022-03-12 DIAGNOSIS — D0461 Carcinoma in situ of skin of right upper limb, including shoulder: Secondary | ICD-10-CM | POA: Diagnosis not present

## 2022-03-12 DIAGNOSIS — D489 Neoplasm of uncertain behavior, unspecified: Secondary | ICD-10-CM

## 2022-03-12 NOTE — Patient Instructions (Addendum)
Do not shower for the rest of the day. When you do wash it, use only soap and water. Do not vigorously scrub. Apply triple antibiotic ointment (like Neosporin) twice daily. Keep the area clean and dry.   Things to look out for: increasing pain not relieved by ibuprofen/acetaminophen, fevers, spreading redness, drainage of pus, or foul odor.  Give [place] 1 week to get the results of your biopsy back.  Let us know if you need anything.

## 2022-03-12 NOTE — Addendum Note (Signed)
Addended by: Sharon Seller B on: 03/12/2022 11:12 AM   Modules accepted: Orders

## 2022-03-12 NOTE — Progress Notes (Signed)
Chief Complaint  Patient presents with   Procedure    Wart removal on arm and back    Justin Rosales is a 71 y.o. male here for a skin complaint.  Duration: several years Location: R forearm Pruritic? No Painful? No Becoming bothersome, he picks at it at times Drainage? No New soaps/lotions/topicals/detergents? No Other associated symptoms: none Therapies tried thus far: compressed air  Past Medical History:  Diagnosis Date   Allergy    Anemia    Arthritis    Blood transfusion without reported diagnosis    C. 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 No drainage, erythema, TTP, fluctuance, excoriation Psych: Age appropriate judgment and insight  Procedure note; shave biopsy; dx and therapeutic removal Informed consent was obtained. The area was cleaned with alcohol and injected with 1 mL of 1% lidocaine with epinephrine. A Dermablade was slightly bent and used to cut under the area of interest. The specimen was placed in a sterile specimen cup and sent to the lab. The area was then cauterized ensuring adequate hemostasis. The area was dressed with triple antibiotic ointment and a bandage. There were no complications noted. The patient tolerated the procedure well.  Neoplasm of uncertain behavior - Plan: PR SHAV SKIN LES  0.6-1.0 CM TRUNK,ARM,LEG  Aftercare instructions verbalized and written down. Warning signs and symptoms verbalized and written down in AVS.  F/u prn. The patient voiced understanding and agreement to the plan.  [place], DO 03/12/22 10:51 AM

## 2022-03-21 ENCOUNTER — Encounter: Payer: Self-pay | Admitting: Family Medicine

## 2022-03-21 ENCOUNTER — Ambulatory Visit (INDEPENDENT_AMBULATORY_CARE_PROVIDER_SITE_OTHER): Payer: Medicare Other | Admitting: Family Medicine

## 2022-03-21 DIAGNOSIS — C44622 Squamous cell carcinoma of skin of right upper limb, including shoulder: Secondary | ICD-10-CM

## 2022-03-21 NOTE — Patient Instructions (Signed)
Do not shower for the rest of the day. When you do wash it, use only soap and water. Do not vigorously scrub. Apply triple antibiotic ointment (like Neosporin) twice daily. Keep the area clean and dry.   Things to look out for: increasing pain not relieved by ice/acetaminophen, fevers, spreading redness, drainage of pus, or foul odor.  OK to take Tylenol 1000 mg (2 extra strength tabs) or 975 mg (3 regular strength tabs) every 6 hours as needed.  Let us know if you need anything.

## 2022-03-21 NOTE — Progress Notes (Signed)
Chief Complaint  Patient presents with   Procedure    Subjective: Patient is a 71 y.o. male here for follow-up.  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  The area is healing well and he is putting triple antibiotic ointment on it.  He is here today for electrodesiccation and curettage.  Past Medical History:  Diagnosis Date   Allergy    Anemia    Arthritis    Blood transfusion without reported diagnosis    C. 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 The area of interest was cleaned with alcohol and anesthetized with 3 mL of 1% lidocaine with epinephrine. After adequate anesthesia was obtained, the area was electrodesiccated with a Hyfrecator and the charred tissue was scraped away with a curette. This process was repeated 2 more times for a total of 3 rounds. The cancerous tissue was easily noted by texture under the curette compared to the healthy surrounding tissue After the final round, the area was cauterized to ensure adequate hemostasis. Triple antibiotic ointment and a bandage were placed. There were no immediate complications noted. The patient tolerated the procedure well.  Assessment and  Plan: SCC (squamous cell carcinoma), arm, right - Plan: PR DESTR MALIG TRUNK,EXTREM 0.6-1 CM  Treatment as above.  Tylenol, ice, triple antibiotic ointment and keeping the area clean and dry recommended in aftercare instructions.  He will let me know if there are any issues with healing or if any portion of the lesion returns.  Warning signs and symptoms verbalized and written down. The patient voiced understanding and agreement to the plan.  [place], DO 03/21/22  4:28 PM

## 2022-03-26 ENCOUNTER — Telehealth: Payer: Self-pay

## 2022-03-26 ENCOUNTER — Inpatient Hospital Stay: Payer: Medicare Other | Attending: Hematology & Oncology

## 2022-03-26 ENCOUNTER — Encounter: Payer: Self-pay | Admitting: Hematology & Oncology

## 2022-03-26 ENCOUNTER — Inpatient Hospital Stay: Payer: Medicare Other

## 2022-03-26 ENCOUNTER — Other Ambulatory Visit: Payer: Self-pay

## 2022-03-26 ENCOUNTER — Ambulatory Visit: Payer: Medicare Other | Admitting: Family Medicine

## 2022-03-26 ENCOUNTER — Inpatient Hospital Stay (HOSPITAL_BASED_OUTPATIENT_CLINIC_OR_DEPARTMENT_OTHER): Payer: Medicare Other | Admitting: Hematology & Oncology

## 2022-03-26 VITALS — BP 150/67 | HR 76 | Temp 98.1°F | Resp 17 | Wt 218.0 lb

## 2022-03-26 DIAGNOSIS — K909 Intestinal malabsorption, unspecified: Secondary | ICD-10-CM

## 2022-03-26 DIAGNOSIS — K51911 Ulcerative colitis, unspecified with rectal bleeding: Secondary | ICD-10-CM | POA: Diagnosis not present

## 2022-03-26 DIAGNOSIS — N189 Chronic kidney disease, unspecified: Secondary | ICD-10-CM | POA: Insufficient documentation

## 2022-03-26 DIAGNOSIS — I82431 Acute embolism and thrombosis of right popliteal vein: Secondary | ICD-10-CM | POA: Insufficient documentation

## 2022-03-26 DIAGNOSIS — D500 Iron deficiency anemia secondary to blood loss (chronic): Secondary | ICD-10-CM | POA: Diagnosis present

## 2022-03-26 DIAGNOSIS — D631 Anemia in chronic kidney disease: Secondary | ICD-10-CM | POA: Insufficient documentation

## 2022-03-26 LAB — CBC WITH DIFFERENTIAL (CANCER CENTER ONLY)
Abs Immature Granulocytes: 0.01 10*3/uL (ref 0.00–0.07)
Basophils Absolute: 0 10*3/uL (ref 0.0–0.1)
Basophils Relative: 1 %
Eosinophils Absolute: 0.3 10*3/uL (ref 0.0–0.5)
Eosinophils Relative: 6 %
HCT: 38.8 % — ABNORMAL LOW (ref 39.0–52.0)
Hemoglobin: 12.8 g/dL — ABNORMAL LOW (ref 13.0–17.0)
Immature Granulocytes: 0 %
Lymphocytes Relative: 38 %
Lymphs Abs: 1.6 10*3/uL (ref 0.7–4.0)
MCH: 32.2 pg (ref 26.0–34.0)
MCHC: 33 g/dL (ref 30.0–36.0)
MCV: 97.7 fL (ref 80.0–100.0)
Monocytes Absolute: 0.4 10*3/uL (ref 0.1–1.0)
Monocytes Relative: 11 %
Neutro Abs: 1.9 10*3/uL (ref 1.7–7.7)
Neutrophils Relative %: 44 %
Platelet Count: 216 10*3/uL (ref 150–400)
RBC: 3.97 MIL/uL — ABNORMAL LOW (ref 4.22–5.81)
RDW: 14.3 % (ref 11.5–15.5)
WBC Count: 4.2 10*3/uL (ref 4.0–10.5)
nRBC: 0 % (ref 0.0–0.2)

## 2022-03-26 LAB — RETICULOCYTES
Immature Retic Fract: 11.8 % (ref 2.3–15.9)
RBC.: 3.92 MIL/uL — ABNORMAL LOW (ref 4.22–5.81)
Retic Count, Absolute: 77.6 10*3/uL (ref 19.0–186.0)
Retic Ct Pct: 2 % (ref 0.4–3.1)

## 2022-03-26 LAB — IRON AND IRON BINDING CAPACITY (CC-WL,HP ONLY)
Iron: 70 ug/dL (ref 45–182)
Saturation Ratios: 22 % (ref 17.9–39.5)
TIBC: 312 ug/dL (ref 250–450)
UIBC: 242 ug/dL (ref 117–376)

## 2022-03-26 LAB — CMP (CANCER CENTER ONLY)
ALT: 21 U/L (ref 0–44)
AST: 20 U/L (ref 15–41)
Albumin: 4.1 g/dL (ref 3.5–5.0)
Alkaline Phosphatase: 68 U/L (ref 38–126)
Anion gap: 7 (ref 5–15)
BUN: 15 mg/dL (ref 8–23)
CO2: 28 mmol/L (ref 22–32)
Calcium: 9.4 mg/dL (ref 8.9–10.3)
Chloride: 105 mmol/L (ref 98–111)
Creatinine: 0.9 mg/dL (ref 0.61–1.24)
GFR, Estimated: >60 mL/min (ref >60)
Glucose, Bld: 109 mg/dL — ABNORMAL HIGH (ref 70–99)
Potassium: 4.2 mmol/L (ref 3.5–5.1)
Sodium: 140 mmol/L (ref 135–145)
Total Bilirubin: 0.5 mg/dL (ref 0.3–1.2)
Total Protein: 7.1 g/dL (ref 6.5–8.1)

## 2022-03-26 LAB — FERRITIN: Ferritin: 78 ng/mL (ref 24–336)

## 2022-03-26 NOTE — Telephone Encounter (Signed)
Advised via MyChart.

## 2022-03-26 NOTE — Telephone Encounter (Signed)
-----   Message from [name], MD sent at 03/26/2022  3:00 PM EDT ----- Call and let him know that the iron level is okay.  Thanks.  [name]

## 2022-03-26 NOTE — Progress Notes (Signed)
Hematology and Oncology Follow Up Visit  Justin Rosales [id number] 1950-08-08 71 y.o. 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  We last saw him back in June.  Since then, he has been doing pretty well.  He and family members are going to go to [place] in a week or so.  I am sure that they will have a wonderful time down there.  Back in early June, he did get a dose of Monoferric.  This was because his iron saturation was 13%.  He does have the ulcerative colitis.  He does take a Remicade for this.  He has had no bleeding.  There is been no diarrhea.  He has had no nausea or vomiting.  He has had no rashes.  There is been no leg swelling.  Overall, I would say his performance status is probably ECOG 0.      Medications:  Current Outpatient Medications:    amLODipine-valsartan (EXFORGE) 5-160 MG tablet, TAKE ONE TABLET BY MOUTH DAILY, Disp: 30 tablet, Rfl: 2   aspirin 81 MG EC tablet, Take 1 tablet (81 mg total) by mouth daily. Swallow whole., Disp: 30 tablet, Rfl: 12   Bepotastine Besilate 1.5 % SOLN, 1 drop 2 (two) times daily., Disp: , Rfl:    Cholecalciferol (DIALYVITE VITAMIN D 5000) 125 MCG (5000 UT) capsule, Take 5,000 Units by mouth daily., Disp: , Rfl:    diphenhydrAMINE HCl, Sleep, (ZZZQUIL PO), Take 10 mLs by mouth daily as needed (sleep)., Disp: , Rfl:    folic acid (FOLVITE) 1 MG tablet, TAKE 2 TABLETS BY MOUTH  DAILY, Disp: 240 tablet, Rfl: 2   inFLIXimab in sodium chloride 0.9 %, Inject into the vein. 01/16/2021 To start taking every 4 weeks., Disp: , Rfl:    mercaptopurine (PURINETHOL) 50 MG tablet, TAKE 1 AND 1/2 TABLETS BY  MOUTH DAILY,  Disp: 180 tablet, Rfl: 3   Multiple Vitamin (MULTIVITAMIN WITH MINERALS) TABS tablet, Take 1 tablet by mouth daily., Disp: , Rfl:    NON FORMULARY, Take 1 tablet by mouth daily. Vital reds-energy supplement, Disp: , Rfl:    rosuvastatin (CRESTOR) 20 MG tablet, Take 1 tablet (20 mg total) by mouth daily., Disp: 90 tablet, Rfl: 3   Saccharomyces boulardii (FLORASTOR PO), Take 1 capsule by mouth in the morning and at bedtime., Disp: , Rfl:    sildenafil (VIAGRA) 100 MG tablet, Take 0.5-1 tablets (50-100 mg total) by mouth daily as needed for erectile dysfunction., Disp: 30 tablet, Rfl: 2  Allergies: No Known Allergies  Past Medical History, Surgical history, Social history, and Family History were reviewed and updated.  Review of Systems: Review of Systems  Constitutional:  Positive for fatigue.  HENT:  Negative.    Eyes: Negative.   Respiratory:  Positive for shortness of breath.   Cardiovascular: Negative.   Gastrointestinal:  Positive for blood in stool and diarrhea.  Endocrine: Negative.   Genitourinary: Negative.    Musculoskeletal: Negative.   Skin: Negative.   Neurological: Negative.   Hematological: Negative.   Psychiatric/Behavioral: Negative.      Physical Exam:  weight is 218 lb (98.9 kg). His oral temperature is 98.1 F (36.7 C).  His blood pressure is 150/67 (abnormal) and his pulse is 76. His respiration is 17 and oxygen saturation is 99%.   Wt Readings from Last 3 Encounters:  03/26/22 218 lb (98.9 kg)  03/12/22 219 lb 8 oz (99.6 kg)  03/11/22 220 lb (99.8 kg)    Physical Exam Vitals reviewed.  HENT:     Head: Normocephalic and atraumatic.  Eyes:     Pupils: Pupils are equal, round, and reactive to light.  Cardiovascular:     Rate and Rhythm: Normal rate and regular rhythm.     Heart sounds: Normal heart sounds.  Pulmonary:     Effort: Pulmonary effort is normal.     Breath sounds: Normal breath sounds.  Abdominal:     General: Bowel sounds are normal.      Palpations: Abdomen is soft.  Musculoskeletal:        General: No tenderness or deformity. Normal range of motion.     Cervical back: Normal range of motion.  Lymphadenopathy:     Cervical: No cervical adenopathy.  Skin:    General: Skin is warm and dry.     Findings: No erythema or rash.  Neurological:     Mental Status: He is alert and oriented to person, place, and time.  Psychiatric:        Behavior: Behavior normal.        Thought Content: Thought content normal.        Judgment: Judgment normal.    Lab Results  Component Value Date   WBC 4.2 03/26/2022   HGB 12.8 (L) 03/26/2022   HCT 38.8 (L) 03/26/2022   MCV 97.7 03/26/2022   PLT 216 03/26/2022     Chemistry      Component Value Date/Time   NA 140 03/11/2022 0820   K 4.6 03/11/2022 0820   CL 111 03/11/2022 0820   CO2 25 03/11/2022 0820   BUN 20 03/11/2022 0820   CREATININE 0.97 03/11/2022 0820   CREATININE 0.94 12/19/2021 0906      Component Value Date/Time   CALCIUM 8.6 (L) 03/11/2022 0820   ALKPHOS 70 03/11/2022 0820   AST 30 03/11/2022 0820   AST 24 12/19/2021 0906   ALT 24 03/11/2022 0820   ALT 22 12/19/2021 0906   BILITOT 0.8 03/11/2022 0820   BILITOT 0.4 12/19/2021 0906       Impression and Plan: Justin Rosales is a very nice 71 year old white male.  He has ulcerative colitis.  He had Clostridium diarrhea.  He became profoundly iron deficient.    I would have to think that his iron studies should be okay right now.  He got iron just 3 months ago.  He does not have bleeding so I would think that he has held onto the iron.  He does not need any Retacrit.  I think we now get him through the holidays.  He can always come back sooner if he feels he needs a lab work done.    [name], MD 9/6/20239:04 AM

## 2022-04-22 ENCOUNTER — Other Ambulatory Visit: Payer: Self-pay

## 2022-04-22 ENCOUNTER — Non-Acute Institutional Stay (HOSPITAL_COMMUNITY)
Admission: RE | Admit: 2022-04-22 | Discharge: 2022-04-22 | Disposition: A | Discharge status: Home / Self Care | Payer: Medicare Other | Source: Ambulatory Visit | Attending: Internal Medicine | Admitting: Internal Medicine

## 2022-04-22 VITALS — BP 140/67 | HR 67 | Temp 97.5°F | Resp 16 | Wt 220.8 lb

## 2022-04-22 DIAGNOSIS — K51018 Ulcerative (chronic) pancolitis with other complication: Secondary | ICD-10-CM | POA: Insufficient documentation

## 2022-04-22 DIAGNOSIS — K51919 Ulcerative colitis, unspecified with unspecified complications: Secondary | ICD-10-CM | POA: Diagnosis not present

## 2022-04-22 LAB — COMPREHENSIVE METABOLIC PANEL
ALT: 24 U/L (ref 0–44)
AST: 22 U/L (ref 15–41)
Albumin: 3.8 g/dL (ref 3.5–5.0)
Alkaline Phosphatase: 67 U/L (ref 38–126)
Anion gap: 5 (ref 5–15)
BUN: 22 mg/dL (ref 8–23)
CO2: 27 mmol/L (ref 22–32)
Calcium: 9 mg/dL (ref 8.9–10.3)
Chloride: 109 mmol/L (ref 98–111)
Creatinine, Ser: 0.84 mg/dL (ref 0.61–1.24)
GFR, Estimated: >60 mL/min (ref >60)
Glucose, Bld: 99 mg/dL (ref 70–99)
Potassium: 4.1 mmol/L (ref 3.5–5.1)
Sodium: 141 mmol/L (ref 135–145)
Total Bilirubin: 0.7 mg/dL (ref 0.3–1.2)
Total Protein: 6.9 g/dL (ref 6.5–8.1)

## 2022-04-22 LAB — CBC WITH DIFFERENTIAL/PLATELET
Abs Immature Granulocytes: 0.01 10*3/uL (ref 0.00–0.07)
Basophils Absolute: 0 10*3/uL (ref 0.0–0.1)
Basophils Relative: 1 %
Eosinophils Absolute: 0.3 10*3/uL (ref 0.0–0.5)
Eosinophils Relative: 7 %
HCT: 36.7 % — ABNORMAL LOW (ref 39.0–52.0)
Hemoglobin: 11.9 g/dL — ABNORMAL LOW (ref 13.0–17.0)
Immature Granulocytes: 0 %
Lymphocytes Relative: 32 %
Lymphs Abs: 1.2 10*3/uL (ref 0.7–4.0)
MCH: 32 pg (ref 26.0–34.0)
MCHC: 32.4 g/dL (ref 30.0–36.0)
MCV: 98.7 fL (ref 80.0–100.0)
Monocytes Absolute: 0.5 10*3/uL (ref 0.1–1.0)
Monocytes Relative: 14 %
Neutro Abs: 1.7 10*3/uL (ref 1.7–7.7)
Neutrophils Relative %: 46 %
Platelets: 250 10*3/uL (ref 150–400)
RBC: 3.72 MIL/uL — ABNORMAL LOW (ref 4.22–5.81)
RDW: 14.6 % (ref 11.5–15.5)
WBC: 3.6 10*3/uL — ABNORMAL LOW (ref 4.0–10.5)
nRBC: 0 % (ref 0.0–0.2)

## 2022-04-22 LAB — SEDIMENTATION RATE: Sed Rate: 13 mm/hr (ref 0–16)

## 2022-04-22 LAB — C-REACTIVE PROTEIN: CRP: <0.5 mg/dL (ref <1.0)

## 2022-04-22 MED ORDER — SODIUM CHLORIDE 0.9 % IV SOLN
10.0000 mg/kg | INTRAVENOUS | Status: DC
  Administered 2022-04-22: 1000 mg via INTRAVENOUS
  Filled 2022-04-22: qty 100

## 2022-04-22 MED ORDER — SODIUM CHLORIDE 0.9 % IV SOLN: INTRAVENOUS | Status: DC | PRN

## 2022-04-22 NOTE — Progress Notes (Addendum)
PATIENT CARE CENTER NOTE:   Diagnosis: Ulcerative pancolitis with other complication ( [place]) ( V67.209)     Provider: Justice Britain MD     Procedure: Remicade 19m /kg infusion + lab draw ( cbc w diff, cmp, crp, esr)     Note: Patient received Remicade infusion ( dose #5 of 11) via PIV. Premeds not required per orders. Labs collected per orders. Infusion titrated per protocol. Patient tolerated well with no adverse reaction. Vital signs stable. Pt declined AVS. Patient to come back every 4 weeks for infusion, scheduled for next infusion on 10/31, pt made aware and verbalized understanding.  Alert, oriented and ambulatory at discharge.

## 2022-04-30 ENCOUNTER — Other Ambulatory Visit: Payer: Self-pay | Admitting: Family Medicine

## 2022-04-30 DIAGNOSIS — I700 Atherosclerosis of aorta: Secondary | ICD-10-CM

## 2022-05-20 ENCOUNTER — Non-Acute Institutional Stay (HOSPITAL_COMMUNITY)
Admission: RE | Admit: 2022-05-20 | Discharge: 2022-05-20 | Disposition: A | Discharge status: Home / Self Care | Payer: Medicare Other | Source: Ambulatory Visit | Attending: Internal Medicine | Admitting: Internal Medicine

## 2022-05-20 DIAGNOSIS — K51018 Ulcerative (chronic) pancolitis with other complication: Secondary | ICD-10-CM | POA: Diagnosis present

## 2022-05-20 MED ORDER — SODIUM CHLORIDE 0.9 % IV SOLN: INTRAVENOUS | Status: DC | PRN

## 2022-05-20 MED ORDER — SODIUM CHLORIDE 0.9 % IV SOLN
1000.0000 mg | INTRAVENOUS | Status: DC
  Administered 2022-05-20: 1000 mg via INTRAVENOUS
  Filled 2022-05-20: qty 100

## 2022-05-20 NOTE — Progress Notes (Signed)
PATIENT CARE CENTER NOTE   Diagnosis: Ulcerative pancolitis with other complication ( [place]) ( S31.594)     Provider: Justice Britain MD     Procedure: Remicade infusion 65m /kg      Note: Patient received Remicade infusion  ( dose 6 of 11) via PIV. No premedications required per orders. No labs required today, per orders labs to be drawn Q2 months, to be drawn with next infusion, pt verbalized understanding. Infusion titrated per protocol. Patient tolerated well with no adverse reaction. Vital signs stable. Pt declined AVS. Patient to come back  monthly for infusion, instructed pt to schedule next infusion at front desk prior to leaving, pt verbalized understanding. Alert, oriented and ambulatory at discharge.

## 2022-05-22 ENCOUNTER — Other Ambulatory Visit: Payer: Self-pay | Admitting: Family Medicine

## 2022-05-22 DIAGNOSIS — I10 Essential (primary) hypertension: Secondary | ICD-10-CM

## 2022-05-27 ENCOUNTER — Encounter (HOSPITAL_COMMUNITY): Payer: Medicare Other

## 2022-06-02 ENCOUNTER — Encounter: Payer: Self-pay | Admitting: Family Medicine

## 2022-06-02 ENCOUNTER — Ambulatory Visit (INDEPENDENT_AMBULATORY_CARE_PROVIDER_SITE_OTHER): Payer: Medicare Other | Admitting: Family Medicine

## 2022-06-02 VITALS — BP 134/66 | HR 81 | Temp 97.9°F | Ht 70.0 in | Wt 222.1 lb

## 2022-06-02 DIAGNOSIS — I700 Atherosclerosis of aorta: Secondary | ICD-10-CM

## 2022-06-02 DIAGNOSIS — Z0000 Encounter for general adult medical examination without abnormal findings: Secondary | ICD-10-CM

## 2022-06-02 DIAGNOSIS — I10 Essential (primary) hypertension: Secondary | ICD-10-CM

## 2022-06-02 LAB — LIPID PANEL
Cholesterol: 127 mg/dL (ref 0–200)
HDL: 53.2 mg/dL (ref >39.00)
LDL Cholesterol: 56 mg/dL (ref 0–99)
NonHDL: 74.13
Total CHOL/HDL Ratio: 2
Triglycerides: 92 mg/dL (ref 0.0–149.0)
VLDL: 18.4 mg/dL (ref 0.0–40.0)

## 2022-06-02 MED ORDER — AMLODIPINE BESYLATE-VALSARTAN 5-160 MG PO TABS: 1.0000 | ORAL_TABLET | Freq: Every day | ORAL | 3 refills | Status: DC

## 2022-06-02 MED ORDER — ROSUVASTATIN CALCIUM 20 MG PO TABS: 20.0000 mg | ORAL_TABLET | Freq: Every day | ORAL | 3 refills | Status: DC

## 2022-06-02 MED ORDER — TADALAFIL 20 MG PO TABS: 10.0000 mg | ORAL_TABLET | ORAL | 11 refills | Status: DC | PRN

## 2022-06-02 NOTE — Progress Notes (Signed)
Chief Complaint  Patient presents with   Annual Exam    Well Male Justin Rosales is here for a complete physical.   His last physical was >1 year ago.  Current diet: in general, a "really good" diet.   Current exercise: walking Weight trend: stable Fatigue out of ordinary? No. Seat belt? Yes.   Advanced directive? In process of getting set up  Health maintenance Shingrix- No Colonoscopy- Yes- has UC and follows closely with GI, appt for colonoscopy 12/8 next mo Tetanus- Yes Hep C- Yes Pneumonia vaccine- Yes  Past Medical History:  Diagnosis Date   Allergy    Anemia    Arthritis    Blood transfusion without reported diagnosis    C. 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 Dr Dorrene German possibly With [facility] x2. last one done around 2012     Medications  Current Outpatient Medications on File Prior to Visit  Medication Sig Dispense Refill   amLODipine-valsartan (EXFORGE) 5-160 MG tablet TAKE 1 TABLET BY MOUTH DAILY 30 tablet 2   aspirin 81 MG EC tablet Take 1 tablet (81 mg total) by mouth daily. Swallow whole. 30 tablet 12   Bepotastine Besilate 1.5 % SOLN 1 drop 2 (two) times daily.     Cholecalciferol (DIALYVITE VITAMIN D 5000) 125 MCG (5000 UT) capsule Take 5,000 Units by mouth daily.     diphenhydrAMINE HCl, Sleep, (ZZZQUIL PO) Take 10 mLs by mouth daily as needed (sleep).     folic acid (FOLVITE) 1 MG tablet TAKE 2 TABLETS BY MOUTH  DAILY 240 tablet 2   inFLIXimab in sodium  chloride 0.9 % Inject into the vein. 01/16/2021 To start taking every 4 weeks.     mercaptopurine (PURINETHOL) 50 MG tablet TAKE 1 AND 1/2 TABLETS BY  MOUTH DAILY 180 tablet 3   Multiple Vitamin (MULTIVITAMIN WITH MINERALS) TABS tablet Take 1 tablet by mouth daily.     NON FORMULARY Take 1 tablet by mouth daily. Vital reds-energy supplement     rosuvastatin (CRESTOR) 20 MG tablet TAKE ONE TABLET BY MOUTH DAILY 90 tablet 3   Saccharomyces boulardii (FLORASTOR PO) Take 1 capsule by mouth in the morning and at bedtime.     sildenafil (VIAGRA) 100 MG tablet Take 0.5-1 tablets (50-100 mg total) by mouth daily as needed for erectile dysfunction. 30 tablet 2    Allergies No Known Allergies  Family History Family History  Problem Relation Age of Onset   Hypertension Mother    Diabetes Mother    Alzheimer's disease Mother    Parkinson's disease Father    Alzheimer's disease Father    Colon cancer Neg Hx    Esophageal cancer Neg Hx    Rectal cancer Neg Hx    Stomach cancer Neg Hx     Review of Systems: Constitutional:  no fevers Eye:  no recent significant change in vision Ears:  No changes in hearing Nose/Mouth/Throat:  no complaints of nasal congestion, no sore  throat Cardiovascular: no chest pain Respiratory:  No shortness of breath Gastrointestinal:  No change in bowel habits GU:  No frequency Integumentary:  no abnormal skin lesions reported Neurologic:  no headaches Endocrine:  denies unexplained weight changes  Exam BP 134/66 (BP Location: Left Arm, Cuff Size: Normal)   Pulse 81   Temp 97.9 F (36.6 C) (Oral)   Ht 5' 10"  (1.778 m)   Wt 222 lb 2 oz (100.8 kg)   SpO2 98%   BMI 31.87 kg/m  General:  well developed, well nourished, in no apparent distress Skin:  no significant moles, warts, or growths Head:  no masses, lesions, or tenderness Eyes:  pupils equal and round, sclera anicteric without injection Ears:  canals without lesions, TMs shiny without retraction, no  obvious effusion, no erythema Nose:  nares patent, mucosa normal Throat/Pharynx:  lips and gingiva without lesion; tongue and uvula midline; non-inflamed pharynx; no exudates or postnasal drainage Lungs:  clear to auscultation, breath sounds equal bilaterally, no respiratory distress Cardio:  regular rate and rhythm, no LE edema or bruits Rectal: Deferred GI: BS+, S, NT, ND, no masses or organomegaly Musculoskeletal:  symmetrical muscle groups noted without atrophy or deformity Neuro:  gait normal; deep tendon reflexes normal and symmetric Psych: well oriented with normal range of affect and appropriate judgment/insight  Assessment and Plan  Well adult exam  Aortic atherosclerosis ([place]) - Plan: Lipid panel, rosuvastatin (CRESTOR) 20 MG tablet  Essential hypertension - Plan: amLODipine-valsartan (EXFORGE) 5-160 MG tablet   Well 71 y.o. male. Counseled on diet and exercise. Shingrix, RSV vaccine rec'd.  Advanced directive form requested today.  CCS- has colonoscopy scheduled for 12/8.  Other orders as above. Follow up in 6 mo.  The patient voiced understanding and agreement to the plan.  [place], DO 06/02/22 7:24 AM

## 2022-06-02 NOTE — Patient Instructions (Addendum)
Give [place] 2-3 business days to get the results of your labs back.   Keep the diet clean and stay active.  The Shingrix vaccine (for shingles) is a 2 shot series spaced 2-6 months apart. It can make people feel low energy, achy and almost like they have the flu for 48 hours after injection. 1/5 people can have nausea and/or vomiting. Please plan accordingly when deciding on when to get this shot. Call your pharmacy to get this. The second shot of the series is less severe regarding the side effects, but it still lasts 48 hours.   Please get me a copy of your advanced directive form at your convenience.   Let us know if you need anything.

## 2022-06-05 ENCOUNTER — Ambulatory Visit (AMBULATORY_SURGERY_CENTER): Payer: Medicare Other | Admitting: *Deleted

## 2022-06-05 VITALS — Ht 70.0 in | Wt 222.0 lb

## 2022-06-05 DIAGNOSIS — K51018 Ulcerative (chronic) pancolitis with other complication: Secondary | ICD-10-CM

## 2022-06-05 MED ORDER — NA SULFATE-K SULFATE-MG SULF 17.5-3.13-1.6 GM/177ML PO SOLN: 1.0000 | Freq: Once | ORAL | 0 refills | Status: AC

## 2022-06-05 NOTE — Progress Notes (Signed)
Patient's chart reviewed by Justin Rosales CNRA prior to previsit and patient appropriate for the [place].  Previsit completed and red dot placed by patient's name on their procedure day (on provider's schedule).     No egg or soy allergy known to patient  No issues known to pt with past sedation with any surgeries or procedures Patient denies ever being told they had issues or difficulty with intubation  No FH of Malignant Hyperthermia Pt is not on diet pills Pt is not on  home 02  Pt is not on blood thinners  Pt denies issues with constipation  No A fib or A flutter Have any cardiac testing pending--no Pt instructed to use [email or web address] or GoodRx for a price reduction on prep

## 2022-06-17 ENCOUNTER — Non-Acute Institutional Stay (HOSPITAL_COMMUNITY)
Admission: RE | Admit: 2022-06-17 | Discharge: 2022-06-17 | Disposition: A | Discharge status: Home / Self Care | Payer: Medicare Other | Source: Ambulatory Visit | Attending: Internal Medicine | Admitting: Internal Medicine

## 2022-06-17 DIAGNOSIS — K51018 Ulcerative (chronic) pancolitis with other complication: Secondary | ICD-10-CM | POA: Diagnosis not present

## 2022-06-17 LAB — CBC WITH DIFFERENTIAL/PLATELET
Abs Immature Granulocytes: 0.01 10*3/uL (ref 0.00–0.07)
Basophils Absolute: 0 10*3/uL (ref 0.0–0.1)
Basophils Relative: 1 %
Eosinophils Absolute: 0.3 10*3/uL (ref 0.0–0.5)
Eosinophils Relative: 8 %
HCT: 37.3 % — ABNORMAL LOW (ref 39.0–52.0)
Hemoglobin: 12.1 g/dL — ABNORMAL LOW (ref 13.0–17.0)
Immature Granulocytes: 0 %
Lymphocytes Relative: 32 %
Lymphs Abs: 1.2 10*3/uL (ref 0.7–4.0)
MCH: 31.4 pg (ref 26.0–34.0)
MCHC: 32.4 g/dL (ref 30.0–36.0)
MCV: 96.9 fL (ref 80.0–100.0)
Monocytes Absolute: 0.4 10*3/uL (ref 0.1–1.0)
Monocytes Relative: 12 %
Neutro Abs: 1.7 10*3/uL (ref 1.7–7.7)
Neutrophils Relative %: 47 %
Platelets: 204 10*3/uL (ref 150–400)
RBC: 3.85 MIL/uL — ABNORMAL LOW (ref 4.22–5.81)
RDW: 14.6 % (ref 11.5–15.5)
WBC: 3.6 10*3/uL — ABNORMAL LOW (ref 4.0–10.5)
nRBC: 0 % (ref 0.0–0.2)

## 2022-06-17 LAB — COMPREHENSIVE METABOLIC PANEL
ALT: 28 U/L (ref 0–44)
AST: 35 U/L (ref 15–41)
Albumin: 3.7 g/dL (ref 3.5–5.0)
Alkaline Phosphatase: 71 U/L (ref 38–126)
Anion gap: 7 (ref 5–15)
BUN: 17 mg/dL (ref 8–23)
CO2: 24 mmol/L (ref 22–32)
Calcium: 9 mg/dL (ref 8.9–10.3)
Chloride: 108 mmol/L (ref 98–111)
Creatinine, Ser: 0.89 mg/dL (ref 0.61–1.24)
GFR, Estimated: >60 mL/min (ref >60)
Glucose, Bld: 108 mg/dL — ABNORMAL HIGH (ref 70–99)
Potassium: 4.7 mmol/L (ref 3.5–5.1)
Sodium: 139 mmol/L (ref 135–145)
Total Bilirubin: 0.6 mg/dL (ref 0.3–1.2)
Total Protein: 7.4 g/dL (ref 6.5–8.1)

## 2022-06-17 MED ORDER — SODIUM CHLORIDE 0.9 % IV SOLN: INTRAVENOUS | Status: DC | PRN

## 2022-06-17 MED ORDER — SODIUM CHLORIDE 0.9 % IV SOLN
10.0000 mg/kg | INTRAVENOUS | Status: DC
  Administered 2022-06-17: 1000 mg via INTRAVENOUS
  Filled 2022-06-17: qty 100

## 2022-06-17 NOTE — Progress Notes (Signed)
PATIENT CARE CENTER NOTE   Diagnosis: Ulcerative pancolitis with other complication ( [place]) ( S28.315)     Provider: Justice Britain MD     Procedure: Remicade infusion 10 mg/kg + labs     Note: Patient received Remicade infusion  ( dose #7 or 11) via PIV. Premeds not required per orders. Labs collected prior to infusion per orders- CBC, CMP, CRP. Infusion titrated per protocol. Patient tolerated well with no adverse reaction. Vital signs stable. Pt declined AVS. Patient to come back Q month for infusion, scheduled for next visit on 12/27 pt made aware and verbalized understanding. Alert, oriented and ambulatory at discharge.

## 2022-06-19 LAB — HIGH SENSITIVITY CRP: CRP, High Sensitivity: 1.37 mg/L (ref 0.00–3.00)

## 2022-06-24 ENCOUNTER — Encounter: Payer: Self-pay | Admitting: Gastroenterology

## 2022-06-24 ENCOUNTER — Encounter (HOSPITAL_COMMUNITY): Payer: Medicare Other

## 2022-06-27 ENCOUNTER — Other Ambulatory Visit: Payer: Self-pay

## 2022-06-27 ENCOUNTER — Encounter: Payer: Self-pay | Admitting: Gastroenterology

## 2022-06-27 ENCOUNTER — Ambulatory Visit (AMBULATORY_SURGERY_CENTER): Payer: Medicare Other | Admitting: Gastroenterology

## 2022-06-27 VITALS — BP 115/64 | HR 77 | Temp 98.0°F | Resp 14 | Ht 70.0 in | Wt 222.0 lb

## 2022-06-27 DIAGNOSIS — D122 Benign neoplasm of ascending colon: Secondary | ICD-10-CM

## 2022-06-27 DIAGNOSIS — K51018 Ulcerative (chronic) pancolitis with other complication: Secondary | ICD-10-CM

## 2022-06-27 DIAGNOSIS — D123 Benign neoplasm of transverse colon: Secondary | ICD-10-CM

## 2022-06-27 DIAGNOSIS — K5140 Inflammatory polyps of colon without complications: Secondary | ICD-10-CM | POA: Diagnosis not present

## 2022-06-27 DIAGNOSIS — K635 Polyp of colon: Secondary | ICD-10-CM | POA: Diagnosis not present

## 2022-06-27 DIAGNOSIS — K51518 Left sided colitis with other complication: Secondary | ICD-10-CM | POA: Diagnosis not present

## 2022-06-27 DIAGNOSIS — D124 Benign neoplasm of descending colon: Secondary | ICD-10-CM

## 2022-06-27 MED ORDER — SODIUM CHLORIDE 0.9 % IV SOLN: 500.0000 mL | INTRAVENOUS | Status: DC

## 2022-06-27 NOTE — Progress Notes (Unsigned)
To pacu, VSS. Report to [email or web address].tb 

## 2022-06-27 NOTE — Progress Notes (Signed)
Vital signs checked by:DT  The patient states no changes in medical or surgical history since pre-visit screening on 06/05/2022.

## 2022-06-27 NOTE — Progress Notes (Signed)
Called to room to assist during endoscopic procedure.  Patient ID and intended procedure confirmed with present staff. Received instructions for my participation in the procedure from the performing physician.  

## 2022-06-27 NOTE — Progress Notes (Unsigned)
GASTROENTEROLOGY PROCEDURE H&P NOTE   Primary Care Physician: Shelda Pal, DO  HPI: Justin Rosales is a 71 y.o. male who presents for Colonoscopy for history of Panulcerative colitis.  Past Medical History:  Diagnosis Date   Allergy    Anemia    Arthritis    Blood transfusion without reported diagnosis    C. 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 Dr Dorrene German possibly With [facility] x2. last one done around 2012    Current Outpatient Medications  Medication Sig Dispense Refill   amLODipine-valsartan (EXFORGE) 5-160 MG tablet Take 1 tablet by mouth daily. 90 tablet 3   aspirin 81 MG EC tablet Take 1 tablet (81 mg total) by mouth daily. Swallow whole. 30 tablet 12   Bepotastine Besilate 1.5 % SOLN 1 drop 2 (two) times daily.     Cholecalciferol (DIALYVITE VITAMIN D 5000) 125 MCG (5000 UT) capsule Take 5,000 Units by mouth daily.     diphenhydrAMINE HCl, Sleep, (ZZZQUIL PO) Take 10 mLs by mouth daily as needed (sleep).     folic acid (FOLVITE) 1 MG tablet TAKE 2 TABLETS BY MOUTH  DAILY 240 tablet 2   inFLIXimab in sodium chloride 0.9 % Inject into the vein. 01/16/2021 To start taking every 4 weeks.     mercaptopurine (PURINETHOL) 50 MG tablet TAKE 1 AND 1/2 TABLETS BY  MOUTH DAILY 180 tablet 3   Multiple Vitamin (MULTIVITAMIN WITH MINERALS) TABS tablet Take 1 tablet by mouth daily.     NON FORMULARY Take 1 tablet by mouth daily. Vital reds-energy supplement     rosuvastatin  (CRESTOR) 20 MG tablet Take 1 tablet (20 mg total) by mouth daily. 90 tablet 3   Saccharomyces boulardii (FLORASTOR PO) Take 1 capsule by mouth in the morning and at bedtime.     tadalafil (CIALIS) 20 MG tablet Take 0.5-1 tablets (10-20 mg total) by mouth every other day as needed for erectile dysfunction. 10 tablet 11   No current facility-administered medications for this visit.    Current Outpatient Medications:    amLODipine-valsartan (EXFORGE) 5-160 MG tablet, Take 1 tablet by mouth daily., Disp: 90 tablet, Rfl: 3   aspirin 81 MG EC tablet, Take 1 tablet (81 mg total) by mouth daily. Swallow whole., Disp: 30 tablet, Rfl: 12   Bepotastine Besilate 1.5 % SOLN, 1 drop 2 (two) times daily., Disp: , Rfl:    Cholecalciferol (DIALYVITE VITAMIN D 5000) 125 MCG (5000 UT) capsule, Take 5,000 Units by mouth daily., Disp: , Rfl:    diphenhydrAMINE HCl, Sleep, (ZZZQUIL PO), Take 10 mLs by mouth daily as needed (sleep)., Disp: , Rfl:    folic acid (FOLVITE) 1 MG tablet, TAKE 2 TABLETS BY MOUTH  DAILY, Disp: 240 tablet, Rfl: 2   inFLIXimab in sodium chloride 0.9 %, Inject into the vein. 01/16/2021 To start taking every 4 weeks., Disp: , Rfl:    mercaptopurine (PURINETHOL) 50 MG tablet, TAKE 1 AND 1/2 TABLETS BY  MOUTH DAILY,  Disp: 180 tablet, Rfl: 3   Multiple Vitamin (MULTIVITAMIN WITH MINERALS) TABS tablet, Take 1 tablet by mouth daily., Disp: , Rfl:    NON FORMULARY, Take 1 tablet by mouth daily. Vital reds-energy supplement, Disp: , Rfl:    rosuvastatin (CRESTOR) 20 MG tablet, Take 1 tablet (20 mg total) by mouth daily., Disp: 90 tablet, Rfl: 3   Saccharomyces boulardii (FLORASTOR PO), Take 1 capsule by mouth in the morning and at bedtime., Disp: , Rfl:    tadalafil (CIALIS) 20 MG tablet, Take 0.5-1 tablets (10-20 mg total) by mouth every other day as needed for erectile dysfunction., Disp: 10 tablet, Rfl: 11 No Known Allergies Family History  Problem Relation Age of Onset   Hypertension Mother     Diabetes Mother    Alzheimer's disease Mother    Parkinson's disease Father    Alzheimer's disease Father    Colon cancer Neg Hx    Esophageal cancer Neg Hx    Rectal cancer Neg Hx    Stomach cancer Neg Hx    Social History   Socioeconomic History   Marital status: Married    Spouse name: Judeen Hammans   Number of children: 2   Years of education: Not on file   Highest education level: Not on file  Occupational History   Occupation: Emergency planning/management officer   Occupation: retired  Tobacco Use   Smoking status: Former    Packs/day: 1.00    Years: 30.00    Total pack years: 30.00    Types: Cigarettes    Quit date: 2001    Years since quitting: 22.9   Smokeless tobacco: Never  Vaping Use   Vaping Use: Never used  Substance and Sexual Activity   Alcohol use: Yes    Comment: ocassionally   Drug use: Not Currently   Sexual activity: Not on file  Other Topics Concern   Not on file  Social History Narrative   Not on file   Social Determinants of Health   Financial Resource Strain: Low Risk  (10/01/2021)   Overall Financial Resource Strain (CARDIA)    Difficulty of Paying Living Expenses: Not hard at all  Food Insecurity: No Food Insecurity (10/01/2021)   Hunger Vital Sign    Worried About Running Out of Food in the Last Year: Never true    Ran Out of Food in the Last Year: Never true  Transportation Needs: No Transportation Needs (10/01/2021)   PRAPARE - Hydrologist (Medical): No    Lack of Transportation (Non-Medical): No  Physical Activity: Insufficiently Active (10/01/2021)   Exercise Vital Sign    Days of Exercise per Week: 2 days    Minutes of Exercise per Session: 60 min  Stress: No Stress Concern Present (10/01/2021)   [place]    Feeling of Stress : Not at all  Social Connections: [place] (10/01/2021)   Social Connection and Isolation Panel [NHANES]    Frequency of  Communication with Friends and Family: More than three times a week    Frequency of Social Gatherings with Friends and Family: More than three times a week    Attends Religious Services: 1 to 4 times per year    Active Member of [employer] or Organizations: Yes    Attends Archivist Meetings: 1 to 4 times per year    Marital Status: Married  Human resources officer Violence: Not At Risk (10/01/2021)   Humiliation, Afraid, Rape, and  Kick questionnaire    Fear of Current or Ex-Partner: No    Emotionally Abused: No    Physically Abused: No    Sexually Abused: No    Physical Exam: There were no vitals filed for this visit. There is no height or weight on file to calculate BMI. GEN: NAD EYE: Sclerae anicteric ENT: MMM CV: Non-tachycardic GI: Soft, NT/ND NEURO:  Alert & Oriented x 3  Lab Results: No results for input(s): "WBC", "HGB", "HCT", "PLT" in the last 72 hours. BMET No results for input(s): "NA", "K", "CL", "CO2", "GLUCOSE", "BUN", "CREATININE", "CALCIUM" in the last 72 hours. LFT No results for input(s): "PROT", "ALBUMIN", "AST", "ALT", "ALKPHOS", "BILITOT", "BILIDIR", "IBILI" in the last 72 hours. PT/INR No results for input(s): "LABPROT", "INR" in the last 72 hours.   Impression / Plan: This is a 71 y.o.male who presents for Colonoscopy for history of Panulcerative colitis.  The risks and benefits of endoscopic evaluation/treatment were discussed with the patient and/or family; these include but are not limited to the risk of perforation, infection, bleeding, missed lesions, lack of diagnosis, severe illness requiring hospitalization, as well as anesthesia and sedation related illnesses.  The patient's history has been reviewed, patient examined, no change in status, and deemed stable for procedure.  The patient and/or family is agreeable to proceed.    Justice Britain, MD [facility] Advanced Endoscopy Office # [phone]

## 2022-06-27 NOTE — Op Note (Signed)
[facility] Patient Name: Justin Rosales Procedure Date: 06/27/2022 3:15 PM MRN: [id number] Endoscopist: Justice Britain , MD, [id number] Age: 71 Referring MD:  Date of Birth: 1950-10-07 Gender: Male Account #: [id number] Procedure:                Colonoscopy Indications:              Chronic ulcerative pancolitis, Follow-up of chronic                            ulcerative pancolitis, Disease activity assessment                            of chronic ulcerative pancolitis (every 4 week                            Remicade + 6MP) Medicines:                Monitored Anesthesia Care Procedure:                Pre-Anesthesia Assessment:                           - Prior to the procedure, a History and Physical                            was performed, and patient medications and                            allergies were reviewed. The patient's tolerance of                            previous anesthesia was also reviewed. The risks                            and benefits of the procedure and the sedation                            options and risks were discussed with the patient.                            All questions were answered, and informed consent                            was obtained. Prior Anticoagulants: The patient has                            taken no anticoagulant or antiplatelet agents                            except for aspirin. ASA Grade Assessment: II - A                            patient with mild systemic disease. After reviewing  the risks and benefits, the patient was deemed in                            satisfactory condition to undergo the procedure.                           After obtaining informed consent, the colonoscope                            was passed under direct vision. Throughout the                            procedure, the patient's blood pressure, pulse, and                            oxygen saturations were  monitored continuously. The                            Olympus CF-HQ190L (Serial# [id number]) Colonoscope was                            introduced through the anus and advanced to the the                            cecum, identified by appendiceal orifice and                            ileocecal valve. The colonoscopy was performed                            without difficulty. The patient tolerated the                            procedure. The quality of the bowel preparation was                            adequate. The ileocecal valve, appendiceal orifice,                            and rectum were photographed. Scope In: 3:21:32 PM Scope Out: 3:48:18 PM Scope Withdrawal Time: 0 hours 22 minutes 43 seconds  Total Procedure Duration: 0 hours 26 minutes 46 seconds  Findings:                 The digital rectal exam findings include                            hemorrhoids. Pertinent negatives include no                            palpable rectal lesions.                           The colon (entire examined portion) revealed  moderately excessive looping.                           Inflammation characterized by congestion (edema),                            erythema, friability and granularity was found in a                            continuous and circumferential pattern from the                            rectum to the transverse colon. There is a tattoo                            present in the distal TC/proximal DC region in area                            of persistent inflammation though overt ulcer as                            had been seen previously is not present. The                            proximal transverse colon, the ascending colon and                            the cecum were spared. The inflammation was                            moderate in severity, and when compared to previous                            examinations, the findings are improved.  Biopsies                            were taken with a cold forceps for histology from                            the right colon. Biopsies were taken with a cold                            forceps for histology from the left colon. Biopsies                            were taken with a cold forceps for histology from                            the rectum.                           Eight sessile polyps were found in the descending  colon (4), transverse colon (3) and ascending colon                            (1). The polyps were 2 to 6 mm in size. These                            polyps were removed with a cold snare. Resection                            and retrieval were complete.                           Non-bleeding non-thrombosed internal hemorrhoids                            were found during retroflexion, during perianal                            exam and during digital exam. The hemorrhoids were                            Grade II (internal hemorrhoids that prolapse but                            reduce spontaneously). Complications:            No immediate complications. Estimated Blood Loss:     Estimated blood loss was minimal. Impression:               - Hemorrhoids found on digital rectal exam.                           - There was significant looping of the colon.                           - Inflammatory bowel disease. Confluent                            inflammation was found from the rectum to the                            transverse colon. There is a tattoo in the distal                            TC/proximal DC (ulcer is not present but                            inflammation remains). This was moderate in                            severity, slightly improved compared to previous                            examinations. Biopsied.                           -  Eight, 2 to 6 mm polyps in the descending colon,                            in the  transverse colon and in the ascending colon,                            removed with a cold snare. Resected and retrieved.                           - Non-bleeding non-thrombosed internal hemorrhoids. Recommendation:           - The patient will be observed post-procedure,                            until all discharge criteria are met.                           - Discharge patient to home.                           - Patient has a contact number available for                            emergencies. The signs and symptoms of potential                            delayed complications were discussed with the                            patient. Return to normal activities tomorrow.                            Written discharge instructions were provided to the                            patient.                           - Resume previous diet.                           - Patient will need to get just before his next                            infusion Remicade Antibody/Drug level to be drawn.                           - Although the colon does look improved it still                            has significant inflammation even with Q4W Remicade.                           - The findings and recommendations were discussed  with the patient.                           - The findings and recommendations were discussed                            with the patient's family. Justice Britain, MD 06/27/2022 4:01:27 PM

## 2022-06-27 NOTE — Patient Instructions (Signed)
Impression/Recommendations:  Polyp and hemorrhoid handouts given to patient.  Resume previous diet.  Get Remicade Antibody/Drug level before next infusion.  YOU HAD AN ENDOSCOPIC PROCEDURE TODAY AT [facility]:   Refer to the procedure report that was given to you for any specific questions about what was found during the examination.  If the procedure report does not answer your questions, please call your gastroenterologist to clarify.  If you requested that your care partner not be given the details of your procedure findings, then the procedure report has been included in a sealed envelope for you to review at your convenience later.  YOU SHOULD EXPECT: Some feelings of bloating in the abdomen. Passage of more gas than usual.  Walking can help get rid of the air that was put into your GI tract during the procedure and reduce the bloating. If you had a lower endoscopy (such as a colonoscopy or flexible sigmoidoscopy) you may notice spotting of blood in your stool or on the toilet paper. If you underwent a bowel prep for your procedure, you may not have a normal bowel movement for a few days.  Please Note:  You might notice some irritation and congestion in your nose or some drainage.  This is from the oxygen used during your procedure.  There is no need for concern and it should clear up in a day or so.  SYMPTOMS TO REPORT IMMEDIATELY:  Following lower endoscopy (colonoscopy or flexible sigmoidoscopy):  Excessive amounts of blood in the stool  Significant tenderness or worsening of abdominal pains  Swelling of the abdomen that is new, acute  Fever of 100F or higher  For urgent or emergent issues, a gastroenterologist can be reached at any hour by calling [phone]. Do not use MyChart messaging for urgent concerns.    DIET:  We do recommend a small meal at first, but then you may proceed to your regular diet.  Drink plenty of fluids but you should avoid alcoholic  beverages for 24 hours.  ACTIVITY:  You should plan to take it easy for the rest of today and you should NOT DRIVE or use heavy machinery until tomorrow (because of the sedation medicines used during the test).    FOLLOW UP: Our staff will call the number listed on your records the next business day following your procedure.  We will call around 7:15- 8:00 am to check on you and address any questions or concerns that you may have regarding the information given to you following your procedure. If we do not reach you, we will leave a message.     If any biopsies were taken you will be contacted by phone or by letter within the next 1-3 weeks.  Please call us at [phone] if you have not heard about the biopsies in 3 weeks.    SIGNATURES/CONFIDENTIALITY: You and/or your care partner have signed paperwork which will be entered into your electronic medical record.  These signatures attest to the fact that that the information above on your After Visit Summary has been reviewed and is understood.  Full responsibility of the confidentiality of this discharge information lies with you and/or your care-partner.

## 2022-06-30 ENCOUNTER — Telehealth: Payer: Self-pay

## 2022-06-30 NOTE — Telephone Encounter (Signed)
  Follow up Call-     06/27/2022    3:05 PM 05/22/2021    8:10 AM 07/26/2020    2:45 PM  Call back number  Post procedure Call Back phone  # [phone]  Permission to leave phone message Yes Yes Yes     Patient questions:  Do you have a fever, pain , or abdominal swelling? No. Pain Score  0 *  Have you tolerated food without any problems? Yes.    Have you been able to return to your normal activities? Yes.    Do you have any questions about your discharge instructions: Diet   No. Medications  No. Follow up visit  No.  Do you have questions or concerns about your Care? No.  Actions: * If pain score is 4 or above: No action needed, pain <4.

## 2022-07-02 ENCOUNTER — Other Ambulatory Visit: Payer: Self-pay

## 2022-07-02 ENCOUNTER — Encounter: Payer: Self-pay | Admitting: Gastroenterology

## 2022-07-02 DIAGNOSIS — K529 Noninfective gastroenteritis and colitis, unspecified: Secondary | ICD-10-CM

## 2022-07-02 DIAGNOSIS — K51018 Ulcerative (chronic) pancolitis with other complication: Secondary | ICD-10-CM

## 2022-07-03 ENCOUNTER — Other Ambulatory Visit: Payer: Medicare Other

## 2022-07-15 ENCOUNTER — Other Ambulatory Visit: Payer: Medicare Other

## 2022-07-15 DIAGNOSIS — K51018 Ulcerative (chronic) pancolitis with other complication: Secondary | ICD-10-CM

## 2022-07-15 DIAGNOSIS — K529 Noninfective gastroenteritis and colitis, unspecified: Secondary | ICD-10-CM

## 2022-07-16 ENCOUNTER — Encounter (HOSPITAL_COMMUNITY): Payer: Medicare Other

## 2022-07-17 ENCOUNTER — Non-Acute Institutional Stay (HOSPITAL_COMMUNITY)
Admission: RE | Admit: 2022-07-17 | Discharge: 2022-07-17 | Disposition: A | Discharge status: Home / Self Care | Payer: Medicare Other | Source: Ambulatory Visit | Attending: Internal Medicine | Admitting: Internal Medicine

## 2022-07-17 DIAGNOSIS — K51019 Ulcerative (chronic) pancolitis with unspecified complications: Secondary | ICD-10-CM | POA: Insufficient documentation

## 2022-07-17 MED ORDER — SODIUM CHLORIDE 0.9 % IV SOLN: INTRAVENOUS | Status: DC | PRN

## 2022-07-17 MED ORDER — SODIUM CHLORIDE 0.9 % IV SOLN
10.0000 mg/kg | INTRAVENOUS | Status: DC
  Administered 2022-07-17: 1000 mg via INTRAVENOUS
  Filled 2022-07-17: qty 100

## 2022-07-17 NOTE — Progress Notes (Signed)
PATIENT CARE CENTER NOTE     Diagnosis: Ulcerative pancolitis with other complication ([place]) (J69.678)     Provider: Justice Britain MD     Procedure: Remicade  15m/kg     Note: Patient received Remicade infusion ( dose # 8 of 11) via PIV. No premeds required per orders.  Infusion titrated per protocol. Patient tolerated well with no adverse reaction. Labs ordered every 2 months and were drawn last month.Vital signs stable. Pt declined AVS. Patient scheduled for next infusion on 08/14/22. Patient alert, oriented and ambulatory at discharge.

## 2022-07-18 ENCOUNTER — Ambulatory Visit (HOSPITAL_COMMUNITY)
Admission: RE | Admit: 2022-07-18 | Discharge: 2022-07-18 | Disposition: A | Discharge status: Home / Self Care | Payer: Medicare Other | Source: Ambulatory Visit | Attending: Gastroenterology | Admitting: Gastroenterology

## 2022-07-18 ENCOUNTER — Ambulatory Visit (HOSPITAL_BASED_OUTPATIENT_CLINIC_OR_DEPARTMENT_OTHER): Payer: Medicare Other

## 2022-07-18 DIAGNOSIS — K51018 Ulcerative (chronic) pancolitis with other complication: Secondary | ICD-10-CM | POA: Diagnosis present

## 2022-07-18 DIAGNOSIS — K529 Noninfective gastroenteritis and colitis, unspecified: Secondary | ICD-10-CM | POA: Diagnosis present

## 2022-07-18 MED ORDER — BARIUM SULFATE 0.1 % PO SUSP
ORAL | Status: AC
  Administered 2022-07-18: 1350 mL
  Filled 2022-07-18: qty 3

## 2022-07-18 MED ORDER — IOHEXOL 300 MG/ML  SOLN
100.0000 mL | Freq: Once | INTRAMUSCULAR | Status: AC | PRN
  Administered 2022-07-18: 100 mL via INTRAVENOUS

## 2022-07-20 LAB — SERIAL MONITORING

## 2022-07-22 LAB — CALPROTECTIN, FECAL: Calprotectin, Fecal: 223 ug/g — ABNORMAL HIGH (ref 0–120)

## 2022-07-22 LAB — INFLIXIMAB+AB (SERIAL MONITOR)
Anti-Infliximab Antibody: <22 ng/mL
Infliximab Drug Level: 31 ug/mL

## 2022-07-24 ENCOUNTER — Inpatient Hospital Stay: Payer: Medicare Other

## 2022-07-24 ENCOUNTER — Ambulatory Visit: Payer: Medicare Other | Admitting: Hematology & Oncology

## 2022-07-24 ENCOUNTER — Ambulatory Visit: Payer: Medicare Other

## 2022-07-25 ENCOUNTER — Inpatient Hospital Stay: Payer: Medicare Other | Admitting: Hematology & Oncology

## 2022-07-25 ENCOUNTER — Encounter: Payer: Self-pay | Admitting: Hematology & Oncology

## 2022-07-25 ENCOUNTER — Inpatient Hospital Stay: Payer: Medicare Other | Attending: Hematology & Oncology

## 2022-07-25 ENCOUNTER — Inpatient Hospital Stay: Payer: Medicare Other

## 2022-07-25 ENCOUNTER — Other Ambulatory Visit: Payer: Self-pay

## 2022-07-25 VITALS — BP 144/65 | HR 91 | Temp 98.9°F | Resp 18 | Ht 71.0 in | Wt 226.8 lb

## 2022-07-25 DIAGNOSIS — I82431 Acute embolism and thrombosis of right popliteal vein: Secondary | ICD-10-CM | POA: Diagnosis not present

## 2022-07-25 DIAGNOSIS — K51911 Ulcerative colitis, unspecified with rectal bleeding: Secondary | ICD-10-CM | POA: Diagnosis not present

## 2022-07-25 DIAGNOSIS — K909 Intestinal malabsorption, unspecified: Secondary | ICD-10-CM

## 2022-07-25 DIAGNOSIS — D500 Iron deficiency anemia secondary to blood loss (chronic): Secondary | ICD-10-CM | POA: Insufficient documentation

## 2022-07-25 LAB — CMP (CANCER CENTER ONLY)
ALT: 22 U/L (ref 0–44)
AST: 21 U/L (ref 15–41)
Albumin: 4.2 g/dL (ref 3.5–5.0)
Alkaline Phosphatase: 67 U/L (ref 38–126)
Anion gap: 7 (ref 5–15)
BUN: 21 mg/dL (ref 8–23)
CO2: 28 mmol/L (ref 22–32)
Calcium: 9.3 mg/dL (ref 8.9–10.3)
Chloride: 103 mmol/L (ref 98–111)
Creatinine: 0.86 mg/dL (ref 0.61–1.24)
GFR, Estimated: >60 mL/min (ref >60)
Glucose, Bld: 105 mg/dL — ABNORMAL HIGH (ref 70–99)
Potassium: 4.4 mmol/L (ref 3.5–5.1)
Sodium: 138 mmol/L (ref 135–145)
Total Bilirubin: 0.6 mg/dL (ref 0.3–1.2)
Total Protein: 7.6 g/dL (ref 6.5–8.1)

## 2022-07-25 LAB — CBC WITH DIFFERENTIAL (CANCER CENTER ONLY)
Abs Immature Granulocytes: 0.09 10*3/uL — ABNORMAL HIGH (ref 0.00–0.07)
Basophils Absolute: 0 10*3/uL (ref 0.0–0.1)
Basophils Relative: 0 %
Eosinophils Absolute: 0.2 10*3/uL (ref 0.0–0.5)
Eosinophils Relative: 2 %
HCT: 35.8 % — ABNORMAL LOW (ref 39.0–52.0)
Hemoglobin: 11.5 g/dL — ABNORMAL LOW (ref 13.0–17.0)
Immature Granulocytes: 1 %
Lymphocytes Relative: 24 %
Lymphs Abs: 2 10*3/uL (ref 0.7–4.0)
MCH: 30.6 pg (ref 26.0–34.0)
MCHC: 32.1 g/dL (ref 30.0–36.0)
MCV: 95.2 fL (ref 80.0–100.0)
Monocytes Absolute: 1.1 10*3/uL — ABNORMAL HIGH (ref 0.1–1.0)
Monocytes Relative: 13 %
Neutro Abs: 5.1 10*3/uL (ref 1.7–7.7)
Neutrophils Relative %: 60 %
Platelet Count: 204 10*3/uL (ref 150–400)
RBC: 3.76 MIL/uL — ABNORMAL LOW (ref 4.22–5.81)
RDW: 14.6 % (ref 11.5–15.5)
WBC Count: 8.3 10*3/uL (ref 4.0–10.5)
nRBC: 0 % (ref 0.0–0.2)

## 2022-07-25 LAB — RETICULOCYTES
Immature Retic Fract: 12.7 % (ref 2.3–15.9)
RBC.: 3.78 MIL/uL — ABNORMAL LOW (ref 4.22–5.81)
Retic Count, Absolute: 66.9 10*3/uL (ref 19.0–186.0)
Retic Ct Pct: 1.8 % (ref 0.4–3.1)

## 2022-07-25 LAB — FERRITIN: Ferritin: 51 ng/mL (ref 24–336)

## 2022-07-25 NOTE — Progress Notes (Signed)
Hematology and Oncology Follow Up Visit  Greggory Safranek [id number] Nov 05, 1950 72 y.o. 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 Fortson is back for follow-up.  We last saw him back in September.  Since then, has been doing quite well.  He had no problems over the holiday season.  When we last saw him in September, his ferritin was 78 with an iron saturation of 22%.  He has had no problems with the ulcerative colitis.  He said he had a colonoscopy at the end of the year.  He said there is still some inflammation on the left side of his colon.  He is going to see his Gastroenterologist about this.  He has had no problems with COVID.  He has had no cough or shortness of breath.  He has had no rashes.  There is been no melena or bright red blood per rectum.  Overall, I would say his performance status is ECOG 0.      Medications:  Current Outpatient Medications:    amLODipine-valsartan (EXFORGE) 5-160 MG tablet, Take 1 tablet by mouth daily., Disp: 90 tablet, Rfl: 3   aspirin 81 MG EC tablet, Take 1 tablet (81 mg total) by mouth daily. Swallow whole., Disp: 30 tablet, Rfl: 12   Bepotastine Besilate 1.5 % SOLN, 1 drop 2 (two) times daily., Disp: , Rfl:    Cholecalciferol (DIALYVITE VITAMIN D 5000) 125 MCG (5000 UT) capsule, Take 5,000 Units by mouth daily., Disp: , Rfl:    diphenhydrAMINE HCl, Sleep, (ZZZQUIL PO), Take 10 mLs by mouth daily as needed (sleep)., Disp: , Rfl:    folic acid (FOLVITE) 1 MG tablet, TAKE 2 TABLETS BY MOUTH  DAILY, Disp: 240 tablet, Rfl: 2   inFLIXimab in sodium chloride 0.9 %, Inject into the vein. 01/16/2021 To start taking every 4 weeks., Disp: , Rfl:     mercaptopurine (PURINETHOL) 50 MG tablet, TAKE 1 AND 1/2 TABLETS BY  MOUTH DAILY, Disp: 180 tablet, Rfl: 3   Multiple Vitamin (MULTIVITAMIN WITH MINERALS) TABS tablet, Take 1 tablet by mouth daily., Disp: , Rfl:    NON FORMULARY, Take 1 tablet by mouth daily. Vital reds-energy supplement, Disp: , Rfl:    rosuvastatin (CRESTOR) 20 MG tablet, Take 1 tablet (20 mg total) by mouth daily., Disp: 90 tablet, Rfl: 3   Saccharomyces boulardii (FLORASTOR PO), Take 1 capsule by mouth in the morning and at bedtime., Disp: , Rfl:    tadalafil (CIALIS) 20 MG tablet, Take 0.5-1 tablets (10-20 mg total) by mouth every other day as needed for erectile dysfunction., Disp: 10 tablet, Rfl: 11  Current Facility-Administered Medications:    0.9 %  sodium chloride infusion, 500 mL, Intravenous, Continuous, Mansouraty, Telford Nab., MD  Allergies: No Known Allergies  Past Medical History, Surgical history, Social history, and Family History were reviewed and updated.  Review of Systems: Review of Systems  Constitutional:  Positive for fatigue.  HENT:  Negative.    Eyes: Negative.   Respiratory:  Positive for shortness of breath.   Cardiovascular: Negative.   Gastrointestinal:  Positive for blood in stool and diarrhea.  Endocrine: Negative.   Genitourinary: Negative.    Musculoskeletal: Negative.   Skin: Negative.  Neurological: Negative.   Hematological: Negative.   Psychiatric/Behavioral: Negative.      Physical Exam:  height is '5\' 11"'$  (1.803 m) and weight is 226 lb 12.8 oz (102.9 kg). His oral temperature is 98.9 F (37.2 C). His blood pressure is 144/65 (abnormal) and his pulse is 91. His respiration is 18 and oxygen saturation is 98%.   Wt Readings from Last 3 Encounters:  07/25/22 226 lb 12.8 oz (102.9 kg)  07/17/22 225 lb (102.1 kg)  06/27/22 222 lb (100.7 kg)    Physical Exam Vitals reviewed.  HENT:     Head: Normocephalic and atraumatic.  Eyes:     Pupils: Pupils are equal, round, and  reactive to light.  Cardiovascular:     Rate and Rhythm: Normal rate and regular rhythm.     Heart sounds: Normal heart sounds.  Pulmonary:     Effort: Pulmonary effort is normal.     Breath sounds: Normal breath sounds.  Abdominal:     General: Bowel sounds are normal.     Palpations: Abdomen is soft.  Musculoskeletal:        General: No tenderness or deformity. Normal range of motion.     Cervical back: Normal range of motion.  Lymphadenopathy:     Cervical: No cervical adenopathy.  Skin:    General: Skin is warm and dry.     Findings: No erythema or rash.  Neurological:     Mental Status: He is alert and oriented to person, place, and time.  Psychiatric:        Behavior: Behavior normal.        Thought Content: Thought content normal.        Judgment: Judgment normal.    Lab Results  Component Value Date   WBC 8.3 07/25/2022   HGB 11.5 (L) 07/25/2022   HCT 35.8 (L) 07/25/2022   MCV 95.2 07/25/2022   PLT 204 07/25/2022     Chemistry      Component Value Date/Time   NA 138 07/25/2022 1456   K 4.4 07/25/2022 1456   CL 103 07/25/2022 1456   CO2 28 07/25/2022 1456   BUN 21 07/25/2022 1456   CREATININE 0.86 07/25/2022 1456      Component Value Date/Time   CALCIUM 9.3 07/25/2022 1456   ALKPHOS 67 07/25/2022 1456   AST 21 07/25/2022 1456   ALT 22 07/25/2022 1456   BILITOT 0.6 07/25/2022 1456       Impression and Plan: Mr. Fehl is a very nice 72 year old white male.  He has ulcerative colitis.  He had Clostridium diarrhea.  He became profoundly iron deficient.    I am very impressed with how well he is doing.  I doubt that we will have to give him any iron.  He certainly does not need any Retacrit.  We will plan for another follow-up in 3 months or so.  If he continues to hold steady, then maybe we can move his appointments out to every 4 months.   [name], MD 1/5/20243:32 PM

## 2022-07-28 LAB — IRON AND IRON BINDING CAPACITY (CC-WL,HP ONLY)
Iron: 21 ug/dL — ABNORMAL LOW (ref 45–182)
Saturation Ratios: 6 % — ABNORMAL LOW (ref 17.9–39.5)
TIBC: 336 ug/dL (ref 250–450)
UIBC: 315 ug/dL (ref 117–376)

## 2022-08-04 ENCOUNTER — Inpatient Hospital Stay: Payer: Medicare Other

## 2022-08-04 VITALS — BP 147/73 | HR 73 | Resp 16

## 2022-08-04 DIAGNOSIS — K909 Intestinal malabsorption, unspecified: Secondary | ICD-10-CM

## 2022-08-04 DIAGNOSIS — D508 Other iron deficiency anemias: Secondary | ICD-10-CM

## 2022-08-04 DIAGNOSIS — D500 Iron deficiency anemia secondary to blood loss (chronic): Secondary | ICD-10-CM | POA: Diagnosis not present

## 2022-08-04 DIAGNOSIS — D631 Anemia in chronic kidney disease: Secondary | ICD-10-CM

## 2022-08-04 MED ORDER — SODIUM CHLORIDE 0.9 % IV SOLN: Freq: Once | INTRAVENOUS | Status: AC

## 2022-08-04 MED ORDER — SODIUM CHLORIDE 0.9 % IV SOLN
1000.0000 mg | Freq: Once | INTRAVENOUS | Status: AC
  Administered 2022-08-04: 1000 mg via INTRAVENOUS
  Filled 2022-08-04: qty 10

## 2022-08-04 NOTE — Patient Instructions (Signed)

## 2022-08-14 ENCOUNTER — Non-Acute Institutional Stay (HOSPITAL_COMMUNITY)
Admission: RE | Admit: 2022-08-14 | Discharge: 2022-08-14 | Disposition: A | Discharge status: Home / Self Care | Payer: Medicare Other | Source: Ambulatory Visit | Attending: Internal Medicine | Admitting: Internal Medicine

## 2022-08-14 VITALS — BP 136/64 | HR 68 | Temp 97.5°F | Resp 16

## 2022-08-14 DIAGNOSIS — K51919 Ulcerative colitis, unspecified with unspecified complications: Secondary | ICD-10-CM

## 2022-08-14 DIAGNOSIS — K51018 Ulcerative (chronic) pancolitis with other complication: Secondary | ICD-10-CM | POA: Diagnosis not present

## 2022-08-14 LAB — CBC WITH DIFFERENTIAL/PLATELET
Abs Immature Granulocytes: 0.01 10*3/uL (ref 0.00–0.07)
Basophils Absolute: 0 10*3/uL (ref 0.0–0.1)
Basophils Relative: 1 %
Eosinophils Absolute: 0.3 10*3/uL (ref 0.0–0.5)
Eosinophils Relative: 7 %
HCT: 37.7 % — ABNORMAL LOW (ref 39.0–52.0)
Hemoglobin: 12.2 g/dL — ABNORMAL LOW (ref 13.0–17.0)
Immature Granulocytes: 0 %
Lymphocytes Relative: 31 %
Lymphs Abs: 1.3 10*3/uL (ref 0.7–4.0)
MCH: 31.2 pg (ref 26.0–34.0)
MCHC: 32.4 g/dL (ref 30.0–36.0)
MCV: 96.4 fL (ref 80.0–100.0)
Monocytes Absolute: 0.5 10*3/uL (ref 0.1–1.0)
Monocytes Relative: 12 %
Neutro Abs: 2 10*3/uL (ref 1.7–7.7)
Neutrophils Relative %: 49 %
Platelets: 245 10*3/uL (ref 150–400)
RBC: 3.91 MIL/uL — ABNORMAL LOW (ref 4.22–5.81)
RDW: 15.7 % — ABNORMAL HIGH (ref 11.5–15.5)
WBC: 4.1 10*3/uL (ref 4.0–10.5)
nRBC: 0 % (ref 0.0–0.2)

## 2022-08-14 LAB — COMPREHENSIVE METABOLIC PANEL
ALT: 28 U/L (ref 0–44)
AST: 26 U/L (ref 15–41)
Albumin: 3.6 g/dL (ref 3.5–5.0)
Alkaline Phosphatase: 61 U/L (ref 38–126)
Anion gap: 7 (ref 5–15)
BUN: 20 mg/dL (ref 8–23)
CO2: 25 mmol/L (ref 22–32)
Calcium: 8.8 mg/dL — ABNORMAL LOW (ref 8.9–10.3)
Chloride: 106 mmol/L (ref 98–111)
Creatinine, Ser: 0.83 mg/dL (ref 0.61–1.24)
GFR, Estimated: >60 mL/min (ref >60)
Glucose, Bld: 98 mg/dL (ref 70–99)
Potassium: 4.2 mmol/L (ref 3.5–5.1)
Sodium: 138 mmol/L (ref 135–145)
Total Bilirubin: 0.6 mg/dL (ref 0.3–1.2)
Total Protein: 6.7 g/dL (ref 6.5–8.1)

## 2022-08-14 LAB — C-REACTIVE PROTEIN: CRP: <0.5 mg/dL (ref <1.0)

## 2022-08-14 MED ORDER — SODIUM CHLORIDE 0.9 % IV SOLN
1000.0000 mg | INTRAVENOUS | Status: DC
  Administered 2022-08-14: 1000 mg via INTRAVENOUS
  Filled 2022-08-14: qty 100

## 2022-08-14 MED ORDER — SODIUM CHLORIDE 0.9 % IV SOLN: INTRAVENOUS | Status: DC | PRN

## 2022-08-14 NOTE — Progress Notes (Signed)
PATIENT CARE CENTER NOTE     Diagnosis: Ulcerative pancolitis with other complication ([place]) (W78.718)     Provider: Justice Britain MD     Procedure: Remicade  '10mg'$ /kg     Note: Patient received Remicade infusion ( dose # 9 of 11) via PIV. No premeds required per orders.  Infusion titrated per protocol. Patient tolerated well with no adverse reaction. Labs ordered every 2 months and were drawn prior to infusion (CMP, CBC, CRP). Vital signs stable. Pt declined AVS. Patient scheduled for next infusion on 09/11/22. Patient alert, oriented and ambulatory at discharge.

## 2022-08-30 IMAGING — DX DG ABDOMEN 2V
3 series · 3 of 3 positions shown · non-contrast
Comparison: None.

CLINICAL DATA: Persistent abdominal distension.

EXAM:
ABDOMEN - 2 VIEW

[abdomen erect]
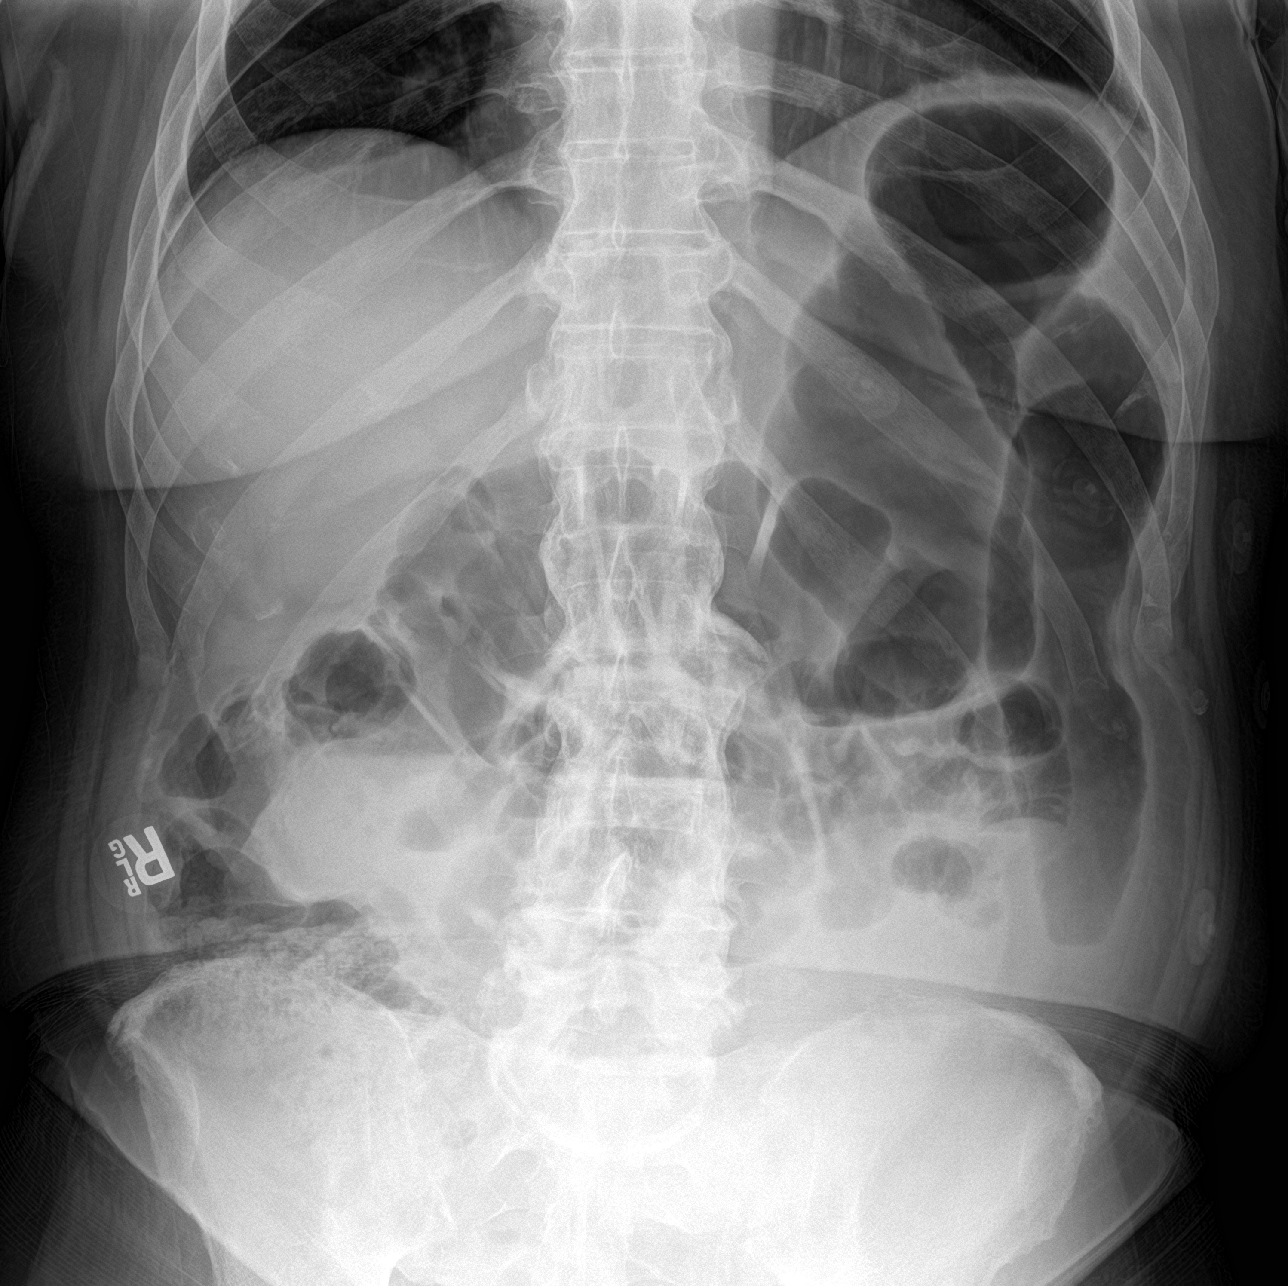

[abdomen supine (1 of 2)]
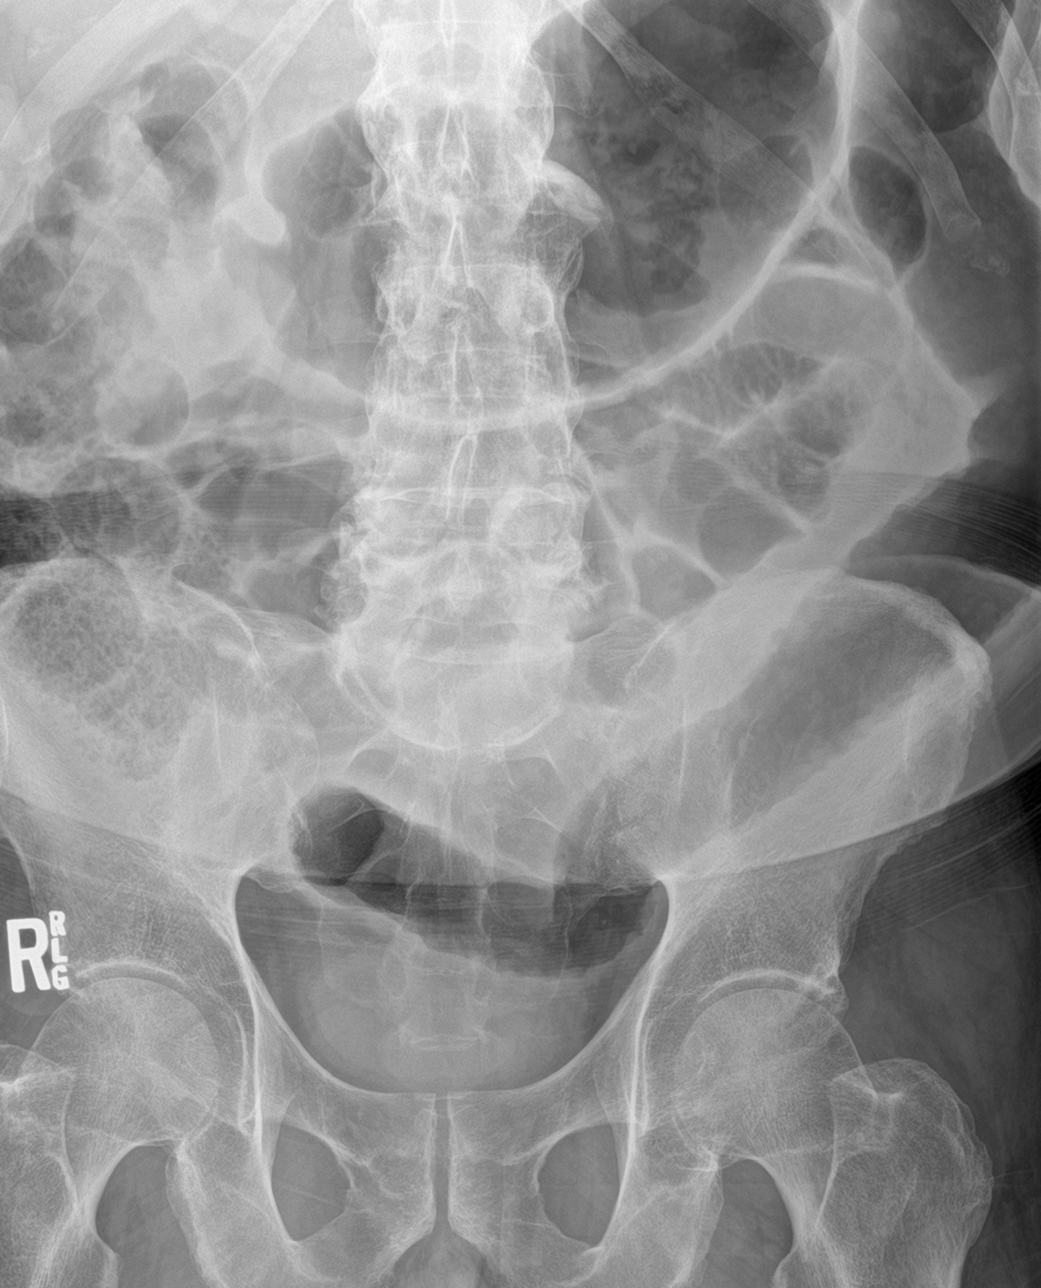

[abdomen supine (2 of 2)]
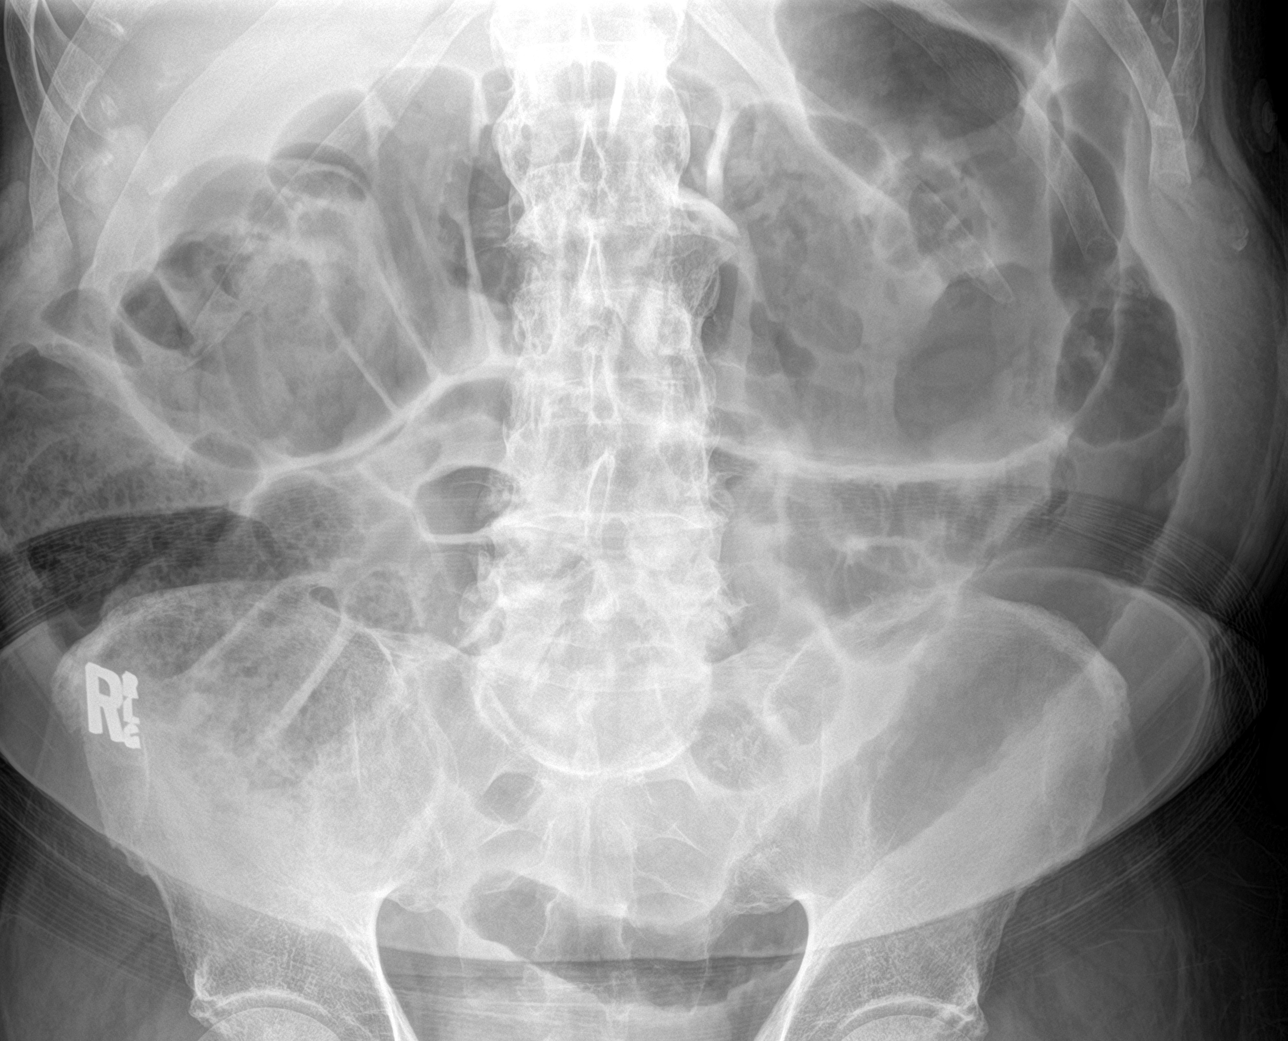

[3 of 3 positions shown; findings below may reference images not displayed]

FINDINGS: Progressive gaseous distension of the colon. No dilated small bowel
loops identified. No signs of free air. Signs of bowel wall edema
and loss of normal haustral markings noted involving the left colon
compatible with colitis.
IMPRESSION: 1. Progressive gaseous distension of the colon suggestive of
worsening colonic ileus.
2. Signs of bowel wall edema involving the left colon compatible
with colitis.

## 2022-09-03 ENCOUNTER — Ambulatory Visit (INDEPENDENT_AMBULATORY_CARE_PROVIDER_SITE_OTHER): Payer: Medicare Other | Admitting: Gastroenterology

## 2022-09-03 ENCOUNTER — Encounter: Payer: Self-pay | Admitting: Gastroenterology

## 2022-09-03 VITALS — BP 136/76 | HR 86 | Ht 71.0 in | Wt 230.0 lb

## 2022-09-03 DIAGNOSIS — D500 Iron deficiency anemia secondary to blood loss (chronic): Secondary | ICD-10-CM | POA: Diagnosis not present

## 2022-09-03 DIAGNOSIS — R933 Abnormal findings on diagnostic imaging of other parts of digestive tract: Secondary | ICD-10-CM | POA: Diagnosis not present

## 2022-09-03 DIAGNOSIS — K51018 Ulcerative (chronic) pancolitis with other complication: Secondary | ICD-10-CM

## 2022-09-03 DIAGNOSIS — K51919 Ulcerative colitis, unspecified with unspecified complications: Secondary | ICD-10-CM

## 2022-09-03 NOTE — Patient Instructions (Addendum)
Consider [facility] or [facility].   Your provider has requested that you go to the basement level for lab work before leaving today. Press "B" on the elevator. The lab is located at the first door on the left as you exit the elevator.  Follow up on : 4 /17 24 at 11:30 am   _______________________________________________________  If your blood pressure at your visit was 140/90 or greater, please contact your primary care physician to follow up on this.  _______________________________________________________  If you are age 70 or older, your body mass index should be between 23-30. Your Body mass index is 32.08 kg/m. If this is out of the aforementioned range listed, please consider follow up with your Primary Care Provider.  If you are age 46 or younger, your body mass index should be between 19-25. Your Body mass index is 32.08 kg/m. If this is out of the aformentioned range listed, please consider follow up with your Primary Care Provider.   ________________________________________________________  The [facility] providers would like to encourage you to use [facility] to communicate with providers for non-urgent requests or questions.  Due to long hold times on the telephone, sending your provider a message by [facility] may be a faster and more efficient way to get a response.  Please allow 48 business hours for a response.  Please remember that this is for non-urgent requests.  _______________________________________________________  Thank you for choosing me and [facility].  Dr. Rush Landmark

## 2022-09-03 NOTE — Progress Notes (Unsigned)
[place] VISIT   Primary Care Provider Shelda Pal, [address] [place] [address] [phone]  Patient Profile: Justin Rosales is a 72 y.o. male with a pmh significant for chronic panulcerative colitis (now on 6MP + Remicade 73m/kg Q4W), prior C. difficile infection (requiring p.o. vancomycin and fidaxomycin), history of Candida esophagitis, chronic anemia with iron deficiency (s/p IV Iron infusions), hypertension, hyperlipidemia, prior VTE (now off AC).  [facility] for an evaluation and management of problem(s) noted below:  Problem List No diagnosis found.   History of Present Illness Please see prior notes for full details of HPI.  Interval History The patient returns for follow-up.  He was scheduled in January to see [place] was sick so was rescheduled to today.  Patient is accompanied by his wife.  Patient continues to feel well.  He had his Remicade infusion today.  It does cause him to get a little bit weak for a few days after but he improves pretty quickly thereafter.  He remains on 10 mg/kg dosing at this time.  His laboratories are returning today.  He is having between 1 and 3 soft to nearly firm normal bowel movements per day.  He has not noted any blood in his stools.  He is not having any urgency at this time either and he is having no nocturnal symptoms.  He is feeling well.  He and his wife do express whether he will need to remain on this dosing for forever or would other medications be needed since he still had healing occurring at the time of his November colonoscopy.  No issues of incontinence have occurred.  BMs per day -1-3 Nocturnal BMs -none Blood -none Mucous -none Tenesmus -none Urgency -none Skin Manifestations -none Eye Manifestations -none Joint Manifestations -none  GI Review of Systems Positive as above Negative for dysphagia, odynophagia,  pain, nausea, vomiting early satiety, melena, hematochezia   Review of Systems General: Denies fevers/chills/weight loss unintentionally HEENT: Denies oral lesions or oral thrush Cardiovascular: Denies chest pain/palpitations Pulmonary: Denies shortness of breath Gastroenterological: See HPI Genitourinary: Denies darkened urine Hematological: Denies easy bruising or bleeding at this time Dermatological: Denies jaundice Psychological: Mood is stable   Medications Current Outpatient Medications  Medication Sig Dispense Refill   amLODipine-valsartan (EXFORGE) 5-160 MG tablet Take 1 tablet by mouth daily. 90 tablet 3   Bepotastine Besilate 1.5 % SOLN 1 drop 2 (two) times daily.     Cholecalciferol (DIALYVITE VITAMIN D 5000) 125 MCG (5000 UT) capsule Take 5,000 Units by mouth daily.     diphenhydrAMINE HCl, Sleep, (ZZZQUIL PO) Take 10 mLs by mouth daily as needed (sleep).     folic acid (FOLVITE) 1 MG tablet TAKE 2 TABLETS BY MOUTH  DAILY 240 tablet 2   inFLIXimab in sodium chloride 0.9 % Inject into the vein. 01/16/2021 To start taking every 4 weeks.     mercaptopurine (PURINETHOL) 50 MG tablet TAKE 1 AND 1/2 TABLETS BY  MOUTH DAILY 180 tablet 3   Multiple Vitamin (MULTIVITAMIN WITH MINERALS) TABS tablet Take 1 tablet by mouth daily.     NON FORMULARY Take 1 tablet by mouth daily. Vital reds-energy supplement     rosuvastatin (CRESTOR) 20 MG tablet Take 1 tablet (20 mg total) by mouth daily. 90 tablet 3   Saccharomyces boulardii (FLORASTOR PO) Take 1 capsule by mouth in the morning and at bedtime.     tadalafil (  CIALIS) 20 MG tablet Take 0.5-1 tablets (10-20 mg total) by mouth every other day as needed for erectile dysfunction. 10 tablet 11   aspirin 81 MG EC tablet Take 1 tablet (81 mg total) by mouth daily. Swallow whole. (Patient not taking: Reported on 09/03/2022) 30 tablet 12   Current Facility-Administered Medications  Medication Dose Route Frequency Provider Last Rate Last Admin    0.9 %  sodium chloride infusion  500 mL Intravenous Continuous Mansouraty, Telford Nab., MD        Allergies No Known Allergies  Histories Past Medical History:  Diagnosis Date   Allergy    Anemia    Arthritis    Blood transfusion without reported diagnosis    C. 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 Dr Dorrene German possibly With [facility] x2. last one done around 2012    Social History   Socioeconomic History   Marital status: Married    Spouse name: Justin Rosales   Number of children: 2   Years of education: Not on file   Highest education level: Not on file  Occupational History   Occupation: Emergency planning/management officer   Occupation: retired  Tobacco Use   Smoking status: Former    Packs/day: 1.00    Years: 30.00    Total pack years: 30.00    Types: Cigarettes    Quit date: 2001    Years since quitting: 23.1   Smokeless tobacco: Never  Vaping Use   Vaping Use: Never used  Substance and Sexual Activity   Alcohol use: Yes    Comment: ocassionally   Drug use: Not Currently   Sexual activity: Not on file  Other Topics Concern   Not on file  Social History Narrative   Not on file   Social Determinants of Health   Financial Resource Strain: Low Risk  (10/01/2021)   Overall Financial Resource Strain (CARDIA)    Difficulty of Paying Living Expenses: Not hard at all  Food Insecurity: No Food Insecurity (10/01/2021)   Hunger Vital Sign    Worried About Running Out of Food in the Last Year: Never  true    [place] in the Last Year: Never true  Transportation Needs: No Transportation Needs (10/01/2021)   PRAPARE - Hydrologist (Medical): No    Lack of Transportation (Non-Medical): No  Physical Activity: Insufficiently Active (10/01/2021)   Exercise Vital Sign    Days of Exercise per Week: 2 days    Minutes of Exercise per Session: 60 min  Stress: No Stress Concern Present (10/01/2021)   [place]    Feeling of Stress : Not at all  Social Connections: [place] (10/01/2021)   Social Connection and Isolation Panel [NHANES]    Frequency of Communication with Friends and Family: More than three times a week    Frequency of Social Gatherings with Friends and Family: More than three times a week  Attends Religious Services: 1 to 4 times per year    Active Member of Clubs or Organizations: Yes    Attends Archivist Meetings: 1 to 4 times per year    Marital Status: Married  Human resources officer Violence: Not At Risk (10/01/2021)   Humiliation, Afraid, Rape, and Kick questionnaire    Fear of Current or Ex-Partner: No    Emotionally Abused: No    Physically Abused: No    Sexually Abused: No   Family History  Problem Relation Age of Onset   Hypertension Mother    Diabetes Mother    Alzheimer's disease Mother    Parkinson's disease Father    Alzheimer's disease Father    Colon cancer Neg Hx    Esophageal cancer Neg Hx    Rectal cancer Neg Hx    Stomach cancer Neg Hx    I have reviewed his medical, social, and family history in detail and updated the electronic medical record as necessary.    PHYSICAL EXAMINATION  BP 136/76   Pulse 86   Ht 5' 11"$  (1.803 m)   Wt 230 lb (104.3 kg)   BMI 32.08 kg/m  Wt Readings from Last 3 Encounters:  09/03/22 230 lb (104.3 kg)  07/25/22 226 lb 12.8 oz (102.9 kg)  07/17/22 225 lb (102.1 kg)  GEN: NAD, appears stated age,  accompanied by wife PSYCH: Cooperative, without pressured speech EYE: Conjunctivae pink, sclerae anicteric ENT: MMM CV: Nontachycardic RESP: No audible wheezing GI: NABS, soft, rounded, NT/ND, without rebound MSK/EXT: No lower extremity edema SKIN: No jaundice NEURO:  Alert & Oriented x 3, no focal deficits   REVIEW OF DATA  I reviewed the following data at the time of this encounter:  GI Procedures and Studies  December 2023 Colonoscoy Impression:               - Hemorrhoids found on digital rectal exam.                           - There was significant looping of the colon.                           - Inflammatory bowel disease. Confluent                            inflammation was found from the rectum to the                            transverse colon. There is a tattoo in the distal                            TC/proximal DC (ulcer is not present but                            inflammation remains). This was moderate in                            severity, slightly improved compared to previous                            examinations. Biopsied.                           -  Eight, 2 to 6 mm polyps in the descending colon,                            in the transverse colon and in the ascending colon,                            removed with a cold snare. Resected and retrieved.                           - Non-bleeding non-thrombosed internal hemorrhoids.  Pathology Diagnosis 1. Surgical [P], right colon bx - COLONIC MUCOSA WITH NO SPECIFIC PATHOLOGIC DIAGNOSIS. - NEGATIVE FOR ACTIVE COLITIS, CRYPT ARCHITECTURAL DISTORTION, DYSPLASIA AND MALIGNANCY. 2. Surgical [P], left colon bx - COLONIC MUCOSA WITH ACTIVE CHRONIC COLITIS WITH ULCERATION AND ACUTELY INFLAMED GRANULATION TISSUE (INFLAMMATORY PSEUDOPOLYPS), FOCAL (FRAGMENTS OF COLONIC MUCOSA WITHOUT ACTIVE INFLAMMATION OR CRYPT ARCHITECTURAL DISTORTION ARE PRESENT). - NEGATIVE FOR DYSPLASIA AND MALIGNANCY. - CMV  IMMUNOHISTOCHEMICAL (IHC) STAIN NEGATIVE, WITH SATISFACTORY CONTROLS. 3. Surgical [P], colon, rectum - COLORECTAL MUCOSA WITH MILD CRYPT ARCHITECTURAL DISARRAY. - NEGATIVE FOR ACTIVE PROCTITIS, DYSPLASIA AND MALIGNANCY. 4. Surgical [P], colon, ascending, transverse, descending, polyp (8) - ACTIVE CHRONIC COLITIS WITH GRANULATION TISSUE (INFLAMMATORY PSEUDOPOLYPS), FOCAL (FRAGMENTS OF COLONIC MUCOSA WITHOUT ACTIVE INFLAMMATION OR CRYPT ARCHITECTURAL DISTORTION ARE PRESENT). - NEGATIVE FOR DYSPLASIA AND MALIGNANCY. - CMV IMMUNOHISTOCHEMICAL (IHC) STAIN NEGATIVE, WITH SATISFACTORY CONTROLS.   Laboratory Studies  Reviewed those in epic and care everywhere  Imaging Studies  No new imaging to review   ASSESSMENT  Mr. Guedes is a 72 y.o. male with a pmh significant for chronic panulcerative colitis (now on 6MP + Remicade 102m/kg Q4W), prior C. difficile infection (requiring p.o. vancomycin and fidaxomycin), history of Candida esophagitis, chronic anemia with iron deficiency (s/p IV Iron infusions), hypertension, hyperlipidemia, prior VTE (now off AC).  The patient is seen today for evaluation and management of:  No diagnosis found.  The patient remains clinically and hemodynamically stable.  Clinically if we are going based just on symptoms I would say he is very close if not already in a remission.  With that being said, his biomarkers of his ESR remain elevated and he has had in the past elevations in his fecal calprotectin I am not able to adjust his 6-MP dosing anymore due to his WBC count.  When we saw his last colonoscopy he did have evidence of persistent changes of inflammation but significantly improved from prior.  It is not clear to me that we have had Remicade on for long enough dosing to know whether he will get complete healing from a mucosal standpoint though I would like to have that if possible.  I am hesitant about switching his medications though we certainly could consider the  possibility of Entyvio or Stelara or Rinvok.  I did discuss SOrson Apewhich was a medication that was brought up by the patient and wife, but this is only for use in Crohn's patients and cannot be used ulcerative colitis.  My preference would be to keep him as he is doing for at least another 4 to 6 months total.  At that time we can consider repeat colonoscopy and if he is still having issues with a mucosal healing standpoint then we can make a decision together whether we switch from Remicade to another agent versus continuing the course if he is doing  well and potentially trying to lengthen the intervals wean his visits if possible to every 6 weeks or back to every 8 weeks depending on things.  I will recheck his Remicade trough level and antibody levels with his next infusion.  We will try to obtain a fecal calprotectin as well at his convenience.  All patient questions were answered to the best of my ability, and the patient agrees to the aforementioned plan of action with follow-up as indicated.   PLAN  Continue Remicade 10 mg/kg every 4 weeks Continue 6-MP 75 mg daily TDM of Remicade in his March fusion Consider repeat endoscopic evaluation in 2024 summer  If patient still has evidence of nonmucosal healing at that time, then may need to consider possible transition of medication Entyvio/Stelara/Rinvok ESR/CRP/CMP/CBC to be obtained with each infusion Will obtain a fecal calprotectin as able   No orders of the defined types were placed in this encounter.    New Prescriptions   No medications on file   Modified Medications   No medications on file    Planned Follow Up No follow-ups on file.   Total Time in Face-to-Face and in Coordination of Care for patient including independent/personal interpretation/review of prior testing, medical history, examination, medication adjustment, communicating results with the patient directly, and documentation with the EHR is 25  minutes.   Justice Britain, MD [facility] Office # [phone]

## 2022-09-07 ENCOUNTER — Encounter: Payer: Self-pay | Admitting: Gastroenterology

## 2022-09-11 ENCOUNTER — Other Ambulatory Visit: Payer: Self-pay | Admitting: Hematology & Oncology

## 2022-09-11 ENCOUNTER — Other Ambulatory Visit: Payer: Self-pay | Admitting: Gastroenterology

## 2022-09-11 ENCOUNTER — Encounter (HOSPITAL_COMMUNITY): Payer: Medicare Other

## 2022-09-18 ENCOUNTER — Non-Acute Institutional Stay (HOSPITAL_COMMUNITY)
Admission: RE | Admit: 2022-09-18 | Discharge: 2022-09-18 | Disposition: A | Discharge status: Home / Self Care | Payer: Medicare Other | Source: Ambulatory Visit | Attending: Internal Medicine | Admitting: Internal Medicine

## 2022-09-18 DIAGNOSIS — K51018 Ulcerative (chronic) pancolitis with other complication: Secondary | ICD-10-CM | POA: Insufficient documentation

## 2022-09-18 MED ORDER — SODIUM CHLORIDE 0.9 % IV SOLN: INTRAVENOUS | Status: DC | PRN

## 2022-09-18 MED ORDER — SODIUM CHLORIDE 0.9 % IV SOLN
1000.0000 mg | Freq: Once | INTRAVENOUS | Status: AC
  Administered 2022-09-18: 1000 mg via INTRAVENOUS
  Filled 2022-09-18: qty 100

## 2022-09-18 NOTE — Progress Notes (Signed)
PATIENT CARE CENTER NOTE     Diagnosis: Ulcerative pancolitis with other complication ([place]) ([id number])     Provider: Justice Britain MD     Procedure: Remicade  '10mg'$ /kg     Note: Patient received Remicade infusion ( dose # 10 of 11) via PIV. No premeds required per orders.  Infusion titrated per protocol. Patient tolerated well with no adverse reaction.  Vital signs stable. Pt declined AVS. Patient scheduled for next infusion on 10/16/22.  Labs will be taken at next appointment. Patient alert, oriented and ambulatory at discharge.

## 2022-09-23 ENCOUNTER — Telehealth: Payer: Self-pay | Admitting: Family Medicine

## 2022-09-23 NOTE — Telephone Encounter (Signed)
Copied from [place] [id number]. Topic: Medicare AWV >> Sep 23, 2022 10:14 AM Devoria Glassing wrote: Reason for CRM: Called patient to schedule Medicare Annual Wellness Visit (AWV). Left message for patient to call back and schedule Medicare Annual Wellness Visit (AWV).  Last date of AWV: 10/01/21  Please schedule an appointment at any time with [facility].  If any questions, please contact me.  Thank you ,  Sherol Dade; [place] Direct Dial: [phone]

## 2022-09-30 ENCOUNTER — Other Ambulatory Visit: Payer: Medicare Other

## 2022-09-30 DIAGNOSIS — D500 Iron deficiency anemia secondary to blood loss (chronic): Secondary | ICD-10-CM

## 2022-10-05 LAB — CALPROTECTIN, FECAL: Calprotectin, Fecal: 212 ug/g — ABNORMAL HIGH (ref 0–120)

## 2022-10-16 ENCOUNTER — Non-Acute Institutional Stay (HOSPITAL_COMMUNITY)
Admission: RE | Admit: 2022-10-16 | Discharge: 2022-10-16 | Disposition: A | Discharge status: Home / Self Care | Payer: Medicare Other | Source: Ambulatory Visit | Attending: Internal Medicine | Admitting: Internal Medicine

## 2022-10-16 VITALS — BP 147/77 | HR 69 | Temp 97.3°F | Resp 16 | Wt 229.0 lb

## 2022-10-16 DIAGNOSIS — K51018 Ulcerative (chronic) pancolitis with other complication: Secondary | ICD-10-CM | POA: Insufficient documentation

## 2022-10-16 DIAGNOSIS — K51919 Ulcerative colitis, unspecified with unspecified complications: Secondary | ICD-10-CM

## 2022-10-16 LAB — COMPREHENSIVE METABOLIC PANEL
ALT: 30 U/L (ref 0–44)
AST: 26 U/L (ref 15–41)
Albumin: 4 g/dL (ref 3.5–5.0)
Alkaline Phosphatase: 63 U/L (ref 38–126)
Anion gap: 7 (ref 5–15)
BUN: 18 mg/dL (ref 8–23)
CO2: 26 mmol/L (ref 22–32)
Calcium: 8.9 mg/dL (ref 8.9–10.3)
Chloride: 105 mmol/L (ref 98–111)
Creatinine, Ser: 0.81 mg/dL (ref 0.61–1.24)
GFR, Estimated: >60 mL/min (ref >60)
Glucose, Bld: 98 mg/dL (ref 70–99)
Potassium: 4 mmol/L (ref 3.5–5.1)
Sodium: 138 mmol/L (ref 135–145)
Total Bilirubin: 0.8 mg/dL (ref 0.3–1.2)
Total Protein: 7.1 g/dL (ref 6.5–8.1)

## 2022-10-16 LAB — CBC WITH DIFFERENTIAL/PLATELET
Abs Immature Granulocytes: 0.02 10*3/uL (ref 0.00–0.07)
Basophils Absolute: 0 10*3/uL (ref 0.0–0.1)
Basophils Relative: 1 %
Eosinophils Absolute: 0.3 10*3/uL (ref 0.0–0.5)
Eosinophils Relative: 7 %
HCT: 40.3 % (ref 39.0–52.0)
Hemoglobin: 13.2 g/dL (ref 13.0–17.0)
Immature Granulocytes: 0 %
Lymphocytes Relative: 27 %
Lymphs Abs: 1.3 10*3/uL (ref 0.7–4.0)
MCH: 31.4 pg (ref 26.0–34.0)
MCHC: 32.8 g/dL (ref 30.0–36.0)
MCV: 95.7 fL (ref 80.0–100.0)
Monocytes Absolute: 0.5 10*3/uL (ref 0.1–1.0)
Monocytes Relative: 10 %
Neutro Abs: 2.6 10*3/uL (ref 1.7–7.7)
Neutrophils Relative %: 55 %
Platelets: 235 10*3/uL (ref 150–400)
RBC: 4.21 MIL/uL — ABNORMAL LOW (ref 4.22–5.81)
RDW: 15.1 % (ref 11.5–15.5)
WBC: 4.8 10*3/uL (ref 4.0–10.5)
nRBC: 0 % (ref 0.0–0.2)

## 2022-10-16 LAB — C-REACTIVE PROTEIN: CRP: <0.5 mg/dL (ref <1.0)

## 2022-10-16 MED ORDER — SODIUM CHLORIDE 0.9 % IV SOLN
1000.0000 mg | INTRAVENOUS | Status: DC
  Administered 2022-10-16: 1000 mg via INTRAVENOUS
  Filled 2022-10-16: qty 100

## 2022-10-16 MED ORDER — SODIUM CHLORIDE 0.9 % IV SOLN: INTRAVENOUS | Status: DC | PRN

## 2022-10-16 NOTE — Progress Notes (Signed)
PATIENT CARE CENTER NOTE     Diagnosis: Ulcerative pancolitis with other complication ([place]) ([id number])     Provider: Justice Britain MD     Procedure: Remicade  10mg /kg and lab draw     Note: Patient received Remicade infusion ( dose # 11 of 11) via PIV. No premeds required per orders.  Labs (CBC, CMP, CRP) ordered for every 2 months and drawn prior to infusion. Infusion titrated per protocol. Patient tolerated well with no adverse reaction.  Vital signs stable. Pt declined AVS. Patient scheduled for next infusion on 11/13/22.  Patient alert, oriented and ambulatory at discharge.

## 2022-10-21 ENCOUNTER — Other Ambulatory Visit: Payer: Self-pay

## 2022-10-21 DIAGNOSIS — K51919 Ulcerative colitis, unspecified with unspecified complications: Secondary | ICD-10-CM

## 2022-11-05 ENCOUNTER — Ambulatory Visit: Payer: Medicare Other | Admitting: Gastroenterology

## 2022-11-13 ENCOUNTER — Non-Acute Institutional Stay (HOSPITAL_COMMUNITY)
Admission: RE | Admit: 2022-11-13 | Discharge: 2022-11-13 | Disposition: A | Discharge status: Home / Self Care | Payer: Medicare Other | Source: Ambulatory Visit | Attending: Internal Medicine | Admitting: Internal Medicine

## 2022-11-13 DIAGNOSIS — K51919 Ulcerative colitis, unspecified with unspecified complications: Secondary | ICD-10-CM

## 2022-11-13 DIAGNOSIS — K51918 Ulcerative colitis, unspecified with other complication: Secondary | ICD-10-CM | POA: Insufficient documentation

## 2022-11-13 LAB — CBC WITH DIFFERENTIAL/PLATELET
Abs Immature Granulocytes: 0.02 10*3/uL (ref 0.00–0.07)
Basophils Absolute: 0 10*3/uL (ref 0.0–0.1)
Basophils Relative: 1 %
Eosinophils Absolute: 0.4 10*3/uL (ref 0.0–0.5)
Eosinophils Relative: 11 %
HCT: 40 % (ref 39.0–52.0)
Hemoglobin: 13 g/dL (ref 13.0–17.0)
Immature Granulocytes: 1 %
Lymphocytes Relative: 33 %
Lymphs Abs: 1.3 10*3/uL (ref 0.7–4.0)
MCH: 31.1 pg (ref 26.0–34.0)
MCHC: 32.5 g/dL (ref 30.0–36.0)
MCV: 95.7 fL (ref 80.0–100.0)
Monocytes Absolute: 0.5 10*3/uL (ref 0.1–1.0)
Monocytes Relative: 12 %
Neutro Abs: 1.8 10*3/uL (ref 1.7–7.7)
Neutrophils Relative %: 42 %
Platelets: 215 10*3/uL (ref 150–400)
RBC: 4.18 MIL/uL — ABNORMAL LOW (ref 4.22–5.81)
RDW: 14.8 % (ref 11.5–15.5)
WBC: 4 10*3/uL (ref 4.0–10.5)
nRBC: 0.5 % — ABNORMAL HIGH (ref 0.0–0.2)

## 2022-11-13 LAB — COMPREHENSIVE METABOLIC PANEL
ALT: 28 U/L (ref 0–44)
AST: 27 U/L (ref 15–41)
Albumin: 4 g/dL (ref 3.5–5.0)
Alkaline Phosphatase: 65 U/L (ref 38–126)
Anion gap: 9 (ref 5–15)
BUN: 17 mg/dL (ref 8–23)
CO2: 27 mmol/L (ref 22–32)
Calcium: 9.3 mg/dL (ref 8.9–10.3)
Chloride: 102 mmol/L (ref 98–111)
Creatinine, Ser: 0.88 mg/dL (ref 0.61–1.24)
GFR, Estimated: >60 mL/min (ref >60)
Glucose, Bld: 104 mg/dL — ABNORMAL HIGH (ref 70–99)
Potassium: 4.3 mmol/L (ref 3.5–5.1)
Sodium: 138 mmol/L (ref 135–145)
Total Bilirubin: 0.6 mg/dL (ref 0.3–1.2)
Total Protein: 7.4 g/dL (ref 6.5–8.1)

## 2022-11-13 LAB — SEDIMENTATION RATE: Sed Rate: 11 mm/hr (ref 0–16)

## 2022-11-13 MED ORDER — SODIUM CHLORIDE 0.9 % IV SOLN: INTRAVENOUS | Status: DC | PRN

## 2022-11-13 MED ORDER — SODIUM CHLORIDE 0.9 % IV SOLN
10.0000 mg/kg | INTRAVENOUS | Status: DC
  Administered 2022-11-13: 1000 mg via INTRAVENOUS
  Filled 2022-11-13: qty 100

## 2022-11-13 NOTE — Progress Notes (Signed)
PATIENT CARE CENTER NOTE     Diagnosis: Chronic ulcerative colitis, unspecified complication [K51.919]      Provider: Corliss Parish MD     Procedure: Remicade /kg and lab draw     Note: Patient received Remicade infusion (dose # 1 of 6) via PIV. No premeds required per orders.  Labs (CBCw/diff, CMP, CRP, Sed Rate) ordered monthly and drawn prior to infusion. Infusion titrated per protocol. Patient tolerated well with no adverse reaction.  Vital signs stable. Pt declined AVS. Patient will schedule next appointment at the front desk.  Patient alert, oriented and ambulatory at discharge.

## 2022-11-15 LAB — HIGH SENSITIVITY CRP: CRP, High Sensitivity: 0.45 mg/L (ref 0.00–3.00)

## 2022-11-18 ENCOUNTER — Encounter: Payer: Self-pay | Admitting: Gastroenterology

## 2022-11-18 ENCOUNTER — Other Ambulatory Visit: Payer: Medicare Other

## 2022-11-18 ENCOUNTER — Ambulatory Visit (INDEPENDENT_AMBULATORY_CARE_PROVIDER_SITE_OTHER): Payer: Medicare Other | Admitting: Gastroenterology

## 2022-11-18 VITALS — BP 140/68 | HR 80 | Ht 70.0 in | Wt 226.0 lb

## 2022-11-18 DIAGNOSIS — K51019 Ulcerative (chronic) pancolitis with unspecified complications: Secondary | ICD-10-CM

## 2022-11-18 DIAGNOSIS — Z9225 Personal history of immunosupression therapy: Secondary | ICD-10-CM

## 2022-11-18 DIAGNOSIS — K529 Noninfective gastroenteritis and colitis, unspecified: Secondary | ICD-10-CM | POA: Diagnosis not present

## 2022-11-18 NOTE — Patient Instructions (Signed)
Your provider has requested that you go to the basement level for lab work before leaving today. Press "B" on the elevator. The lab is located at the first door on the left as you exit the elevator.  You will be due for a recall colonoscopy in September. We will send you a reminder in the mail when it gets closer to that time.   _______________________________________________________  If your blood pressure at your visit was 140/90 or greater, please contact your primary care physician to follow up on this.  _______________________________________________________  If you are age 50 or older, your body mass index should be between 23-30. Your Body mass index is 32.43 kg/m. If this is out of the aforementioned range listed, please consider follow up with your Primary Care Provider.  If you are age 72 or younger, your body mass index should be between 19-25. Your Body mass index is 32.43 kg/m. If this is out of the aformentioned range listed, please consider follow up with your Primary Care Provider.   ________________________________________________________  The [place] [facility] providers would like to encourage you to use [facility] to communicate with providers for non-urgent requests or questions.  Due to long hold times on the telephone, sending your provider a message by [facility] may be a faster and more efficient way to get a response.  Please allow 48 business hours for a response.  Please remember that this is for non-urgent requests.  _______________________________________________________  Thank you for choosing me and [facility].  Dr. Meridee Score

## 2022-11-20 ENCOUNTER — Telehealth: Payer: Self-pay | Admitting: Family Medicine

## 2022-11-20 NOTE — Telephone Encounter (Signed)
Contacted Ashwin Tibbs to schedule their annual wellness visit. Appointment made for 12/18/2022.  Verlee Rossetti; Care Guide Ambulatory Clinical Support [place] [facility] Direct Dial: [phone]

## 2022-11-22 DIAGNOSIS — Z9225 Personal history of immunosupression therapy: Secondary | ICD-10-CM | POA: Insufficient documentation

## 2022-11-22 DIAGNOSIS — K529 Noninfective gastroenteritis and colitis, unspecified: Secondary | ICD-10-CM | POA: Insufficient documentation

## 2022-11-22 NOTE — Progress Notes (Signed)
GASTROENTEROLOGY OUTPATIENT CLINIC VISIT   Primary Care Provider Justin Dory, DO [address] [place] [address] [phone]  Patient Profile: Justin Rosales is a 72 y.o. male with a pmh significant for chronic panulcerative colitis (now on + Remicade 10mg /kg Q4W), prior C. difficile infection (requiring p.o. vancomycin and fidaxomycin), history of Candida esophagitis, chronic anemia with iron deficiency (s/p IV Iron infusions), hypertension, hyperlipidemia, prior VTE (now off AC).  The patient presents to the [facility] for an evaluation and management of problem(s) noted below:  Problem List 1. Ulcerative pancolitis with complication (HCC)   2. IBD (inflammatory bowel disease)   3. History of immunosuppression therapy     History of Present Illness Please see prior notes for full details of HPI.  Interval History The patient returns for follow-up and is accompanied by his wife.  Overall, the patient is doing even better than when I had last seen him.  He continues on his Remicade infusions every 4 weeks.  His last set of laboratories show no evidence of overt anemia his inflammatory markers continue to decrease.  His last fecal calprotectin did remain elevated however.  The patient is still potentially moving to the [place] area but not as of yet.  BMs per day -1-2 (formed to semiformed) Nocturnal BMs -none Blood -none Mucous -none Tenesmus -none Urgency -none Skin Manifestations -none Eye Manifestations -none Joint Manifestations -none  GI Review of Systems Positive as above Negative for dysphagia, odynophagia, pain, nausea, vomiting early satiety, melena, hematochezia   Review of Systems General: Denies fevers/chills/weight loss unintentionally HEENT: Denies oral lesions Cardiovascular: Denies chest pain Pulmonary: Denies shortness of breath Gastroenterological: See HPI Genitourinary: Denies darkened  urine Hematological: Still has some easy bruising or bleeding even while off anticoagulation Dermatological: Denies jaundice Psychological: Mood is stable   Medications Current Outpatient Medications  Medication Sig Dispense Refill   amLODipine-valsartan (EXFORGE) 5-160 MG tablet Take 1 tablet by mouth daily. 90 tablet 3   aspirin 81 MG EC tablet Take 1 tablet (81 mg total) by mouth daily. Swallow whole. 30 tablet 12   Bepotastine Besilate 1.5 % SOLN 1 drop 2 (two) times daily.     Cholecalciferol (DIALYVITE VITAMIN D 5000) 125 MCG (5000 UT) capsule Take 5,000 Units by mouth daily.     diphenhydrAMINE HCl, Sleep, (ZZZQUIL PO) Take 10 mLs by mouth daily as needed (sleep).     folic acid (FOLVITE) 1 MG tablet TAKE 2 TABLETS BY MOUTH DAILY 240 tablet 2   inFLIXimab in sodium chloride 0.9 % Inject into the vein. 01/16/2021 To start taking every 4 weeks.     mercaptopurine (PURINETHOL) 50 MG tablet TAKE 1 AND 1/2 TABLETS BY  MOUTH DAILY 180 tablet 3   Multiple Vitamin (MULTIVITAMIN WITH MINERALS) TABS tablet Take 1 tablet by mouth daily.     NON FORMULARY Take 1 tablet by mouth daily. Vital reds-energy supplement     rosuvastatin (CRESTOR) 20 MG tablet Take 1 tablet (20 mg total) by mouth daily. 90 tablet 3   Saccharomyces boulardii (FLORASTOR PO) Take 1 capsule by mouth in the morning and at bedtime.     tadalafil (CIALIS) 20 MG tablet Take 0.5-1 tablets (10-20 mg total) by mouth every other day as needed for erectile dysfunction. 10 tablet 11   Current Facility-Administered Medications  Medication Dose Route Frequency Provider Last Rate Last Admin   0.9 %  sodium chloride infusion  500 mL Intravenous Continuous Mansouraty, Vicente Serene  Montez Hageman., MD        Allergies No Known Allergies  Histories Past Medical History:  Diagnosis Date   Allergy    Anemia    Arthritis    Blood transfusion without reported diagnosis    C. 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 Dr Loman Chroman possibly With [facility] x2. last one done around 2012    Social History   Socioeconomic History   Marital status: Married    Spouse name: Justin Rosales   Number of children: 2   Years of education: Not on file   Highest education level: Not on file  Occupational History   Occupation: Estate agent   Occupation: retired  Tobacco Use   Smoking status: Former    Packs/day: 1.00    Years: 30.00    Additional pack years: 0.00    Total pack years: 30.00    Types: Cigarettes    Quit date: 2001    Years since quitting: 23.3   Smokeless tobacco: Never  Vaping Use   Vaping Use: Never used  Substance and Sexual Activity   Alcohol use: Yes    Comment: ocassionally   Drug use: Not Currently   Sexual activity: Not on file  Other Topics Concern   Not on file  Social History Narrative   Not on file   Social Determinants of Health   Financial Resource Strain: Low Risk  (10/01/2021)   Overall Financial Resource Strain (CARDIA)    Difficulty of Paying Living Expenses: Not hard at all  Food Insecurity: No Food Insecurity (10/01/2021)   Hunger Vital Sign    Worried About Running Out of Food in the Last Year: Never true    Ran Out of Food in the Last Year: Never true  Transportation Needs: No Transportation Needs (10/01/2021)   PRAPARE - Administrator, Civil Service (Medical): No    Lack of Transportation (Non-Medical): No  Physical Activity: Insufficiently Active (10/01/2021)   Exercise Vital Sign    Days of  Exercise per Week: 2 days    Minutes of Exercise per Session: 60 min  Stress: No Stress Concern Present (10/01/2021)   [employer] of Occupational Health - Occupational Stress Questionnaire    Feeling of Stress : Not at all  Social Connections: Socially Integrated (10/01/2021)   Social Connection and Isolation Panel [NHANES]    Frequency of Communication with Friends and Family: More than three times a week    Frequency of Social Gatherings with Friends and Family: More than three times a week    Attends Religious Services: 1 to 4 times per year    Active Member of [employer] or Organizations: Yes    Attends Banker Meetings: 1 to 4 times per year    Marital Status: Married  Catering manager Violence: Not At Risk (10/01/2021)   Humiliation, Afraid, Rape, and Kick questionnaire    Fear of Current or Ex-Partner: No    Emotionally Abused: No  Physically Abused: No    Sexually Abused: No   Family History  Problem Relation Age of Onset   Hypertension Mother    Diabetes Mother    Alzheimer's disease Mother    Parkinson's disease Father    Alzheimer's disease Father    Colon cancer Neg Hx    Esophageal cancer Neg Hx    Rectal cancer Neg Hx    Stomach cancer Neg Hx    I have reviewed his medical, social, and family history in detail and updated the electronic medical record as necessary.    PHYSICAL EXAMINATION  BP (!) 140/68   Pulse 80   Ht 5\' 10"  (1.778 m)   Wt 226 lb (102.5 kg)   BMI 32.43 kg/m  Wt Readings from Last 3 Encounters:  11/18/22 226 lb (102.5 kg)  11/13/22 230 lb (104.3 kg)  10/16/22 229 lb (103.9 kg)  GEN: NAD, appears stated age, accompanied by wife PSYCH: Cooperative, without pressured speech EYE: Conjunctivae pink, sclerae anicteric ENT: MMM CV: Nontachycardic RESP: No audible wheezing GI: NABS, soft, rounded, NT/ND, without rebound MSK/EXT: No lower extremity edema SKIN: No jaundice NEURO:  Alert & Oriented x 3, no focal  deficits   REVIEW OF DATA  I reviewed the following data at the time of this encounter:  GI Procedures and Studies  Previously reviewed  Laboratory Studies  Reviewed those in epic and care everywhere  Imaging Studies  No new imaging studies to review    ASSESSMENT  Mr. Kellar is a 72 y.o. male with a pmh significant for chronic panulcerative colitis (now on + Remicade 10mg /kg Q4W), prior C. difficile infection (requiring p.o. vancomycin and fidaxomycin), history of Candida esophagitis, chronic anemia with iron deficiency (s/p IV Iron infusions), hypertension, hyperlipidemia, prior VTE (now off AC).  The patient is seen today for evaluation and management of:  1. Ulcerative pancolitis with complication (HCC)   2. IBD (inflammatory bowel disease)   3. History of immunosuppression therapy    Patient is hemodynamically and clinically stable.  He seems to be in a near clinical remission however endoscopically he did not have deep mucosal remission and he still has elevated fecal calprotectin when last checked.  Thankfully, he no longer has anemia and his inflammatory markers have continued to decrease.  We have previously discussed consideration of transition his medication to [facility] or Stelara or potentially Skyrizi if it gets approval.  With this being said we have felt that this is mostly been significant ulcerative colitis but severe Crohn's colitis could still be an indeterminate colitis, we will need to keep in mind.  We have maxed out his 6-MP dosing based on his white blood cell count.  We did discuss the potential of having IBD consultation at one of the quaternary centers but he wants to defer on that for now.  I think that his overall clinical remission is where things stand and will continue to stand for now.  However, he has stated that he would not want any stone unturned.  My only concern is if we transition his therapy and he fails then did we do the right thing for him.  Time  will tell.  Will see what his fecal calprotectin shows.  Will see how his repeat labs look like in a couple of months.  If something else develops or changes then we can certainly change course.  If the fecal calprotectin starts elevating or symptoms develop, then there is no question will have to consider  changing course.  All patient questions were answered to the best of my ability, and the patient agrees to the aforementioned plan of action with follow-up as indicated.   PLAN  QuantiFERON with next set of labs Continue Remicade 10 mg/kg every 4 weeks Continue 6-MP 75 mg daily ESR/CRP/CMP/CBC with every other infusion Obtain fecal calprotectin Consider Entyvio/Stelara or Skyrizi if it becomes approved for transition in future   Orders Placed This Encounter  Procedures   Calprotectin, Fecal   QuantiFERON-TB Gold Plus     New Prescriptions   No medications on file   Modified Medications   No medications on file    Planned Follow Up No follow-ups on file.   Total Time in Face-to-Face and in Coordination of Care for patient including independent/personal interpretation/review of prior testing, medical history, examination, medication adjustment, communicating results with the patient directly, and documentation with the EHR is 30 minutes.   Corliss Parish, MD [facility] Office # [phone]

## 2022-11-23 ENCOUNTER — Encounter: Payer: Self-pay | Admitting: Gastroenterology

## 2022-11-24 ENCOUNTER — Inpatient Hospital Stay: Payer: Medicare Other

## 2022-11-24 ENCOUNTER — Inpatient Hospital Stay: Payer: Medicare Other | Attending: Hematology & Oncology

## 2022-11-24 ENCOUNTER — Inpatient Hospital Stay (HOSPITAL_BASED_OUTPATIENT_CLINIC_OR_DEPARTMENT_OTHER): Payer: Medicare Other | Admitting: Hematology & Oncology

## 2022-11-24 ENCOUNTER — Encounter: Payer: Self-pay | Admitting: Gastroenterology

## 2022-11-24 ENCOUNTER — Telehealth: Payer: Self-pay | Admitting: *Deleted

## 2022-11-24 ENCOUNTER — Encounter: Payer: Self-pay | Admitting: Hematology & Oncology

## 2022-11-24 VITALS — BP 162/61 | HR 83 | Temp 97.9°F | Resp 18 | Wt 226.8 lb

## 2022-11-24 DIAGNOSIS — I82431 Acute embolism and thrombosis of right popliteal vein: Secondary | ICD-10-CM | POA: Insufficient documentation

## 2022-11-24 DIAGNOSIS — K51911 Ulcerative colitis, unspecified with rectal bleeding: Secondary | ICD-10-CM | POA: Insufficient documentation

## 2022-11-24 DIAGNOSIS — K51019 Ulcerative (chronic) pancolitis with unspecified complications: Secondary | ICD-10-CM

## 2022-11-24 DIAGNOSIS — D500 Iron deficiency anemia secondary to blood loss (chronic): Secondary | ICD-10-CM | POA: Insufficient documentation

## 2022-11-24 DIAGNOSIS — K909 Intestinal malabsorption, unspecified: Secondary | ICD-10-CM | POA: Diagnosis not present

## 2022-11-24 LAB — CBC WITH DIFFERENTIAL (CANCER CENTER ONLY)
Abs Immature Granulocytes: 0.01 10*3/uL (ref 0.00–0.07)
Basophils Absolute: 0 10*3/uL (ref 0.0–0.1)
Basophils Relative: 1 %
Eosinophils Absolute: 0.4 10*3/uL (ref 0.0–0.5)
Eosinophils Relative: 7 %
HCT: 37.6 % — ABNORMAL LOW (ref 39.0–52.0)
Hemoglobin: 12.6 g/dL — ABNORMAL LOW (ref 13.0–17.0)
Immature Granulocytes: 0 %
Lymphocytes Relative: 32 %
Lymphs Abs: 1.6 10*3/uL (ref 0.7–4.0)
MCH: 31.8 pg (ref 26.0–34.0)
MCHC: 33.5 g/dL (ref 30.0–36.0)
MCV: 94.9 fL (ref 80.0–100.0)
Monocytes Absolute: 0.7 10*3/uL (ref 0.1–1.0)
Monocytes Relative: 13 %
Neutro Abs: 2.3 10*3/uL (ref 1.7–7.7)
Neutrophils Relative %: 47 %
Platelet Count: 216 10*3/uL (ref 150–400)
RBC: 3.96 MIL/uL — ABNORMAL LOW (ref 4.22–5.81)
RDW: 14.8 % (ref 11.5–15.5)
WBC Count: 4.9 10*3/uL (ref 4.0–10.5)
nRBC: 0 % (ref 0.0–0.2)

## 2022-11-24 LAB — IRON AND IRON BINDING CAPACITY (CC-WL,HP ONLY)
Iron: 44 ug/dL — ABNORMAL LOW (ref 45–182)
Saturation Ratios: 14 % — ABNORMAL LOW (ref 17.9–39.5)
TIBC: 319 ug/dL (ref 250–450)
UIBC: 275 ug/dL (ref 117–376)

## 2022-11-24 LAB — CMP (CANCER CENTER ONLY)
ALT: 23 U/L (ref 0–44)
AST: 20 U/L (ref 15–41)
Albumin: 4.2 g/dL (ref 3.5–5.0)
Alkaline Phosphatase: 83 U/L (ref 38–126)
Anion gap: 8 (ref 5–15)
BUN: 19 mg/dL (ref 8–23)
CO2: 25 mmol/L (ref 22–32)
Calcium: 9.2 mg/dL (ref 8.9–10.3)
Chloride: 106 mmol/L (ref 98–111)
Creatinine: 1.08 mg/dL (ref 0.61–1.24)
GFR, Estimated: >60 mL/min (ref >60)
Glucose, Bld: 124 mg/dL — ABNORMAL HIGH (ref 70–99)
Potassium: 4.5 mmol/L (ref 3.5–5.1)
Sodium: 139 mmol/L (ref 135–145)
Total Bilirubin: 0.3 mg/dL (ref 0.3–1.2)
Total Protein: 7.2 g/dL (ref 6.5–8.1)

## 2022-11-24 LAB — RETICULOCYTES
Immature Retic Fract: 13.5 % (ref 2.3–15.9)
RBC.: 3.96 MIL/uL — ABNORMAL LOW (ref 4.22–5.81)
Retic Count, Absolute: 76.4 10*3/uL (ref 19.0–186.0)
Retic Ct Pct: 1.9 % (ref 0.4–3.1)

## 2022-11-24 LAB — FERRITIN: Ferritin: 79 ng/mL (ref 24–336)

## 2022-11-24 NOTE — Progress Notes (Signed)
Hematology and Oncology Follow Up Visit  Justin Rosales [id number] 10-08-1950 72 y.o. 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  He is doing quite well.  We last saw him back in January.  At that point time, we had to give him some IV iron.  His ferritin was 51 with an iron saturation of 6%.  He is still on mercaptopurine and Remicade for the ulcerative colitis.  He has had no flareups of the ulcerative colitis.  He has had no issues with nausea or vomiting.  There is been no obvious melena or bright red blood per rectum.  He has had no cough.  He has had no rashes.  There has been no leg swelling.  He has had no issues with fever.  Thankfully, has been no problems with COVID.  Overall, I would have to say that his performance status is probably ECOG 1.   Current Outpatient Medications:    amLODipine-valsartan (EXFORGE) 5-160 MG tablet, Take 1 tablet by mouth daily., Disp: 90 tablet, Rfl: 3   aspirin 81 MG EC tablet, Take 1 tablet (81 mg total) by mouth daily. Swallow whole., Disp: 30 tablet, Rfl: 12   Bepotastine Besilate 1.5 % SOLN, 1 drop 2 (two) times daily., Disp: , Rfl:    Cholecalciferol (DIALYVITE VITAMIN D 5000) 125 MCG (5000 UT) capsule, Take 5,000 Units by mouth daily., Disp: , Rfl:    diphenhydrAMINE HCl, Sleep, (ZZZQUIL PO), Take 10 mLs by mouth daily as needed (sleep)., Disp: , Rfl:    folic acid (FOLVITE) 1 MG tablet, TAKE 2 TABLETS BY MOUTH DAILY, Disp: 240 tablet, Rfl: 2   inFLIXimab in sodium chloride 0.9 %, Inject into the vein. 01/16/2021 To start taking every 4 weeks., Disp: , Rfl:    mercaptopurine (PURINETHOL) 50 MG tablet, TAKE 1  AND 1/2 TABLETS BY  MOUTH DAILY, Disp: 180 tablet, Rfl: 3   Multiple Vitamin (MULTIVITAMIN WITH MINERALS) TABS tablet, Take 1 tablet by mouth daily., Disp: , Rfl:    NON FORMULARY, Take 1 tablet by mouth daily. Vital reds-energy supplement, Disp: , Rfl:    rosuvastatin (CRESTOR) 20 MG tablet, Take 1 tablet (20 mg total) by mouth daily., Disp: 90 tablet, Rfl: 3   Saccharomyces boulardii (FLORASTOR PO), Take 1 capsule by mouth in the morning and at bedtime., Disp: , Rfl:    tadalafil (CIALIS) 20 MG tablet, Take 0.5-1 tablets (10-20 mg total) by mouth every other day as needed for erectile dysfunction., Disp: 10 tablet, Rfl: 11  Current Facility-Administered Medications:    0.9 %  sodium chloride infusion, 500 mL, Intravenous, Continuous, Mansouraty, Netty Starring., MD  Allergies: No Known Allergies  Past Medical History, Surgical history, Social history, and Family History were reviewed and updated.  Review of Systems: Review of Systems  Constitutional:  Positive for fatigue.  HENT:  Negative.    Eyes: Negative.   Respiratory:  Positive for shortness of breath.   Cardiovascular: Negative.   Gastrointestinal:  Positive for blood in stool and diarrhea.  Endocrine: Negative.   Genitourinary: Negative.    Musculoskeletal: Negative.   Skin: Negative.   Neurological: Negative.   Hematological: Negative.  Psychiatric/Behavioral: Negative.      Physical Exam:  weight is 226 lb 12.8 oz (102.9 kg). His oral temperature is 97.9 F (36.6 C). His blood pressure is 162/61 (abnormal) and his pulse is 83. His respiration is 18 and oxygen saturation is 98%.   Wt Readings from Last 3 Encounters:  11/24/22 226 lb 12.8 oz (102.9 kg)  11/18/22 226 lb (102.5 kg)  11/13/22 230 lb (104.3 kg)    Physical Exam Vitals reviewed.  HENT:     Head: Normocephalic and atraumatic.  Eyes:     Pupils: Pupils are equal, round, and reactive to light.  Cardiovascular:     Rate and Rhythm: Normal rate and  regular rhythm.     Heart sounds: Normal heart sounds.  Pulmonary:     Effort: Pulmonary effort is normal.     Breath sounds: Normal breath sounds.  Abdominal:     General: Bowel sounds are normal.     Palpations: Abdomen is soft.  Musculoskeletal:        General: No tenderness or deformity. Normal range of motion.     Cervical back: Normal range of motion.  Lymphadenopathy:     Cervical: No cervical adenopathy.  Skin:    General: Skin is warm and dry.     Findings: No erythema or rash.  Neurological:     Mental Status: He is alert and oriented to person, place, and time.  Psychiatric:        Behavior: Behavior normal.        Thought Content: Thought content normal.        Judgment: Judgment normal.    Lab Results  Component Value Date   WBC 4.9 11/24/2022   HGB 12.6 (L) 11/24/2022   HCT 37.6 (L) 11/24/2022   MCV 94.9 11/24/2022   PLT 216 11/24/2022     Chemistry      Component Value Date/Time   NA 138 11/13/2022 0815   K 4.3 11/13/2022 0815   CL 102 11/13/2022 0815   CO2 27 11/13/2022 0815   BUN 17 11/13/2022 0815   CREATININE 0.88 11/13/2022 0815   CREATININE 0.86 07/25/2022 1456      Component Value Date/Time   CALCIUM 9.3 11/13/2022 0815   ALKPHOS 65 11/13/2022 0815   AST 27 11/13/2022 0815   AST 21 07/25/2022 1456   ALT 28 11/13/2022 0815   ALT 22 07/25/2022 1456   BILITOT 0.6 11/13/2022 0815   BILITOT 0.6 07/25/2022 1456       Impression and Plan: Justin Rosales is a very nice 72 year old white male.  He has ulcerative colitis.  He had Clostridium diarrhea.  He became profoundly iron deficient.    We will have to see how his iron levels are doing.  His hemoglobin dropped a little bit.  His MCV dropped a little bit.  I know that he will have a very busy summer.  Will try to get him through all of summer and then have him come back to see [place].   Josph Macho, MD 5/6/20248:11 AM

## 2022-11-24 NOTE — Telephone Encounter (Signed)
-----   Message from Josph Macho, MD sent at 11/24/2022  2:37 PM EDT ----- Please call and let him know that the iron level is on the low side.  He needs to have 1 dose of IV iron.  Please set this up.  Thanks.  Cindee Lame

## 2022-12-01 ENCOUNTER — Encounter: Payer: Self-pay | Admitting: Family Medicine

## 2022-12-01 ENCOUNTER — Ambulatory Visit (INDEPENDENT_AMBULATORY_CARE_PROVIDER_SITE_OTHER): Payer: Medicare Other | Admitting: Family Medicine

## 2022-12-01 VITALS — BP 118/80 | HR 88 | Temp 98.8°F | Ht 70.0 in | Wt 224.2 lb

## 2022-12-01 DIAGNOSIS — Z111 Encounter for screening for respiratory tuberculosis: Secondary | ICD-10-CM

## 2022-12-01 DIAGNOSIS — I10 Essential (primary) hypertension: Secondary | ICD-10-CM

## 2022-12-01 DIAGNOSIS — I700 Atherosclerosis of aorta: Secondary | ICD-10-CM

## 2022-12-01 LAB — COMPREHENSIVE METABOLIC PANEL
ALT: 31 U/L (ref 0–53)
AST: 28 U/L (ref 0–37)
Albumin: 4.2 g/dL (ref 3.5–5.2)
Alkaline Phosphatase: 74 U/L (ref 39–117)
BUN: 14 mg/dL (ref 6–23)
CO2: 27 mEq/L (ref 19–32)
Calcium: 9.5 mg/dL (ref 8.4–10.5)
Chloride: 104 mEq/L (ref 96–112)
Creatinine, Ser: 0.87 mg/dL (ref 0.40–1.50)
GFR: 86.7 mL/min (ref >60.00)
Glucose, Bld: 106 mg/dL — ABNORMAL HIGH (ref 70–99)
Potassium: 4.4 mEq/L (ref 3.5–5.1)
Sodium: 140 mEq/L (ref 135–145)
Total Bilirubin: 0.5 mg/dL (ref 0.2–1.2)
Total Protein: 7.1 g/dL (ref 6.0–8.3)

## 2022-12-01 LAB — LIPID PANEL
Cholesterol: 113 mg/dL (ref 0–200)
HDL: 48 mg/dL (ref >39.00)
LDL Cholesterol: 46 mg/dL (ref 0–99)
NonHDL: 64.71
Total CHOL/HDL Ratio: 2
Triglycerides: 93 mg/dL (ref 0.0–149.0)
VLDL: 18.6 mg/dL (ref 0.0–40.0)

## 2022-12-01 MED ORDER — SILDENAFIL CITRATE 100 MG PO TABS: 100.0000 mg | ORAL_TABLET | Freq: Every day | ORAL | 2 refills | Status: DC | PRN

## 2022-12-01 NOTE — Patient Instructions (Addendum)
Give [place] 2-3 business days to get the results of your labs back. The Tb testing can take around 1 week to return.   Keep the diet clean and stay active.  Let us know if you need anything.

## 2022-12-01 NOTE — Progress Notes (Signed)
Chief Complaint  Patient presents with   Follow-up    Subjective Justin Rosales is a 72 y.o. male who presents for hypertension follow up. He does monitor home blood pressures. Blood pressures ranging from 130's/60-70's on average. He is compliant with medications- Exforge 5-160 mg/d. Patient has these side effects of medication: none He is adhering to a healthy diet overall. Current exercise: walking No CP or SOB.    Hyperlipidemia Patient presents for dyslipidemia follow up. Currently being treated with Crestor 20 mg/d and compliance with treatment thus far has been good. He denies myalgias. Diet/exercise as above.  The patient is not known to have coexisting coronary artery disease.  Past Medical History:  Diagnosis Date   Allergy    Anemia    Arthritis    Blood transfusion without reported diagnosis    C. 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   Lower leg DVT (deep venous thromboembolism), acute, right (HCC) 09/28/2020   UC (ulcerative colitis) (HCC)     Exam BP 118/80 (BP Location: Left Arm, Patient Position: Sitting, Cuff Size: Normal)   Pulse 88   Temp 98.8 F (37.1 C) (Oral)   Ht 5\' 10"  (1.778 m)   Wt 224 lb 4 oz (101.7 kg)   SpO2 95%   BMI 32.18 kg/m  General:  well developed, well nourished, in no apparent distress Heart: RRR, no bruits, no LE edema Lungs: clear to auscultation, no accessory muscle use Psych: well oriented with normal range of affect and appropriate judgment/insight  Essential hypertension  Aortic atherosclerosis (HCC) - Plan: Comprehensive metabolic panel, Lipid panel  Screening-pulmonary TB - Plan: QuantiFERON-TB Gold Plus  Chronic, stable.  Continue Exforge 5-160 mg  daily.  Counseled on diet and exercise. Chronic, stable.  Continue Crestor 20 mg daily. Needs this done to help the GI team as they may be starting Biologics. F/u in 6 mo. The patient voiced understanding and agreement to the plan.  Jilda Roche [place], DO 12/01/22  10:16 AM

## 2022-12-02 ENCOUNTER — Inpatient Hospital Stay: Payer: Medicare Other

## 2022-12-02 VITALS — BP 134/65 | HR 70 | Temp 99.3°F | Resp 18 | Ht 70.0 in | Wt 224.0 lb

## 2022-12-02 DIAGNOSIS — K909 Intestinal malabsorption, unspecified: Secondary | ICD-10-CM

## 2022-12-02 DIAGNOSIS — D631 Anemia in chronic kidney disease: Secondary | ICD-10-CM

## 2022-12-02 DIAGNOSIS — D500 Iron deficiency anemia secondary to blood loss (chronic): Secondary | ICD-10-CM | POA: Diagnosis not present

## 2022-12-02 DIAGNOSIS — D508 Other iron deficiency anemias: Secondary | ICD-10-CM

## 2022-12-02 MED ORDER — SODIUM CHLORIDE 0.9 % IV SOLN
1000.0000 mg | Freq: Once | INTRAVENOUS | Status: AC
  Administered 2022-12-02: 1000 mg via INTRAVENOUS
  Filled 2022-12-02: qty 10

## 2022-12-02 MED ORDER — SODIUM CHLORIDE 0.9 % IV SOLN: Freq: Once | INTRAVENOUS | Status: AC

## 2022-12-02 NOTE — Progress Notes (Signed)
Patient refused to wait 30 minutes post infusion. Released stble and ASX.

## 2022-12-02 NOTE — Patient Instructions (Signed)

## 2022-12-03 LAB — QUANTIFERON-TB GOLD PLUS
Mitogen-NIL: >10 IU/mL
NIL: 0.97 IU/mL
QuantiFERON-TB Gold Plus: NEGATIVE
TB1-NIL: <0 IU/mL
TB2-NIL: 0.04 IU/mL

## 2022-12-09 ENCOUNTER — Other Ambulatory Visit: Payer: Self-pay

## 2022-12-09 ENCOUNTER — Ambulatory Visit: Payer: Medicare Other

## 2022-12-09 DIAGNOSIS — K529 Noninfective gastroenteritis and colitis, unspecified: Secondary | ICD-10-CM

## 2022-12-09 DIAGNOSIS — K51019 Ulcerative (chronic) pancolitis with unspecified complications: Secondary | ICD-10-CM

## 2022-12-09 IMAGING — US US EXTREM LOW VENOUS*R*
1 series · 13 of 24 positions shown · non-contrast
Comparison: None available

CLINICAL DATA: Prior DVT right calf



[Series 1: us extrem low venous*right* · 13 of 43 slices shown]
[im 1/43]
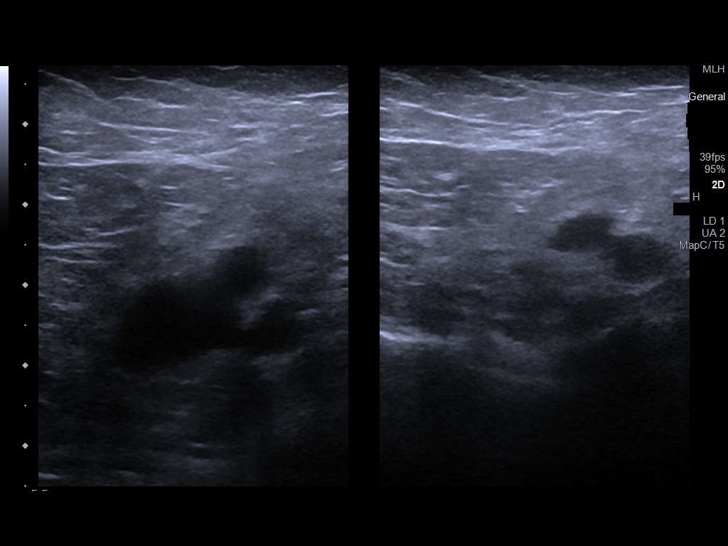
[im 4/43]
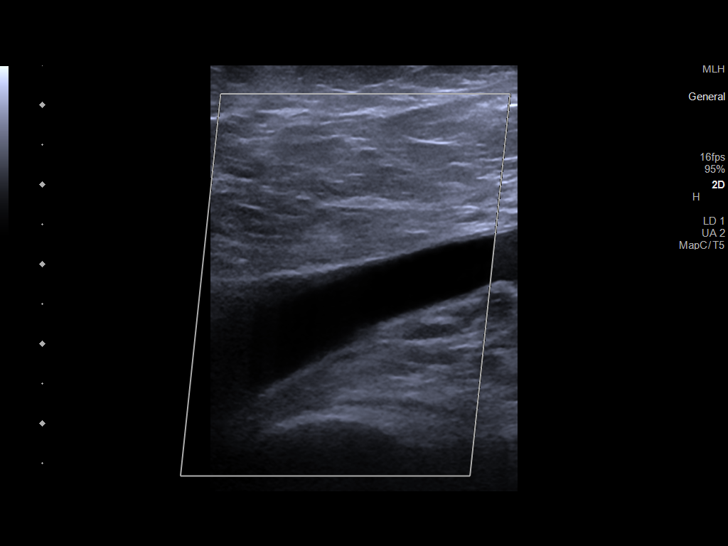
[im 8/43]
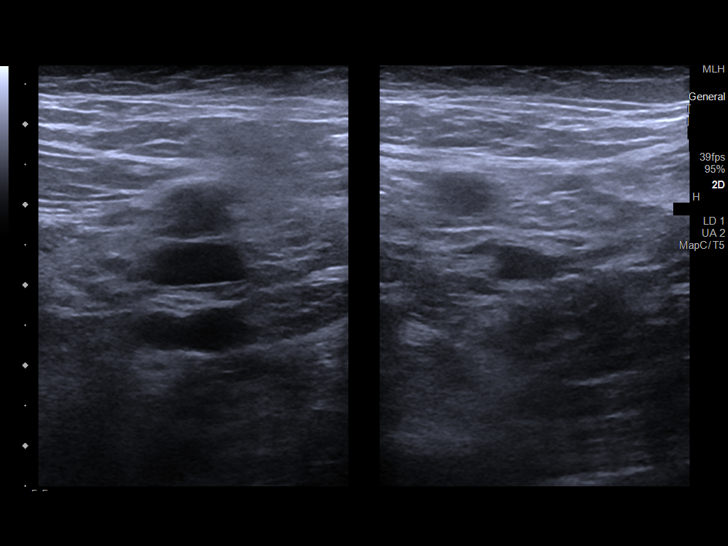
[im 11/43]
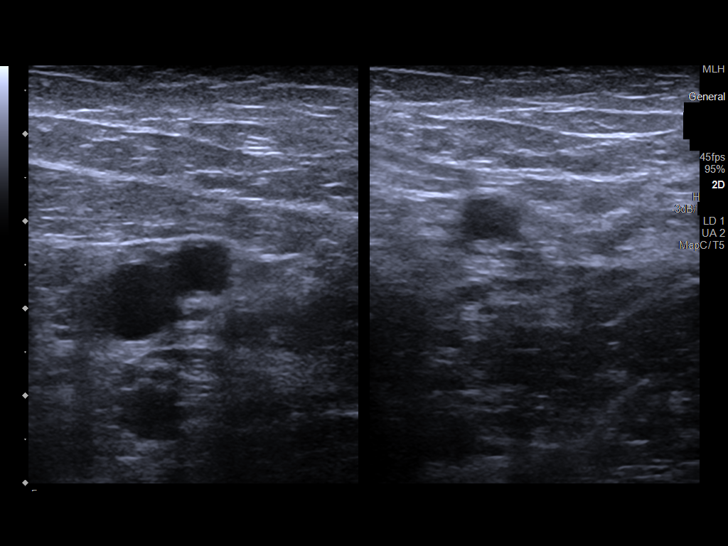
[im 15/43]
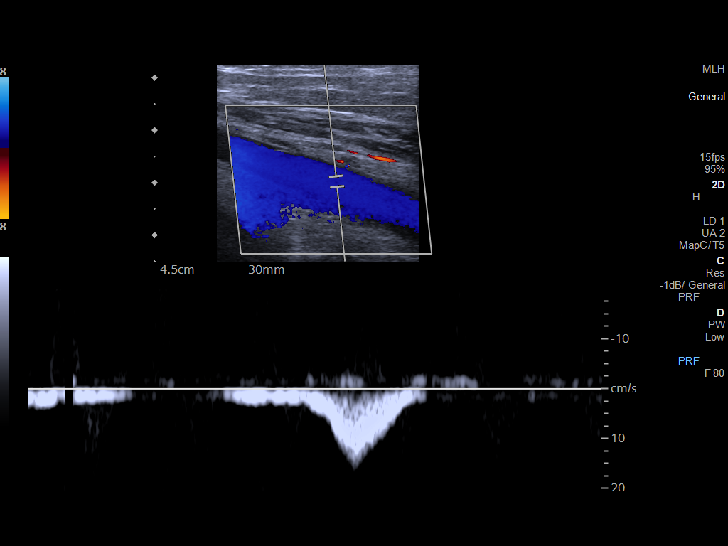
[im 19/43]
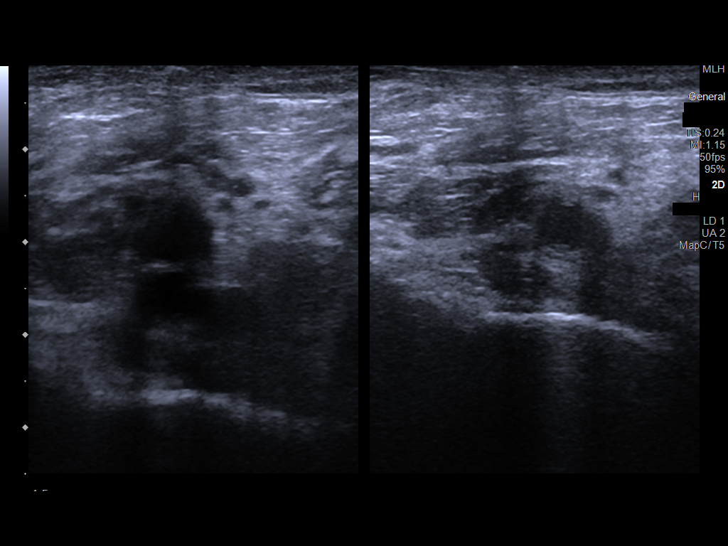
[im 22/43]
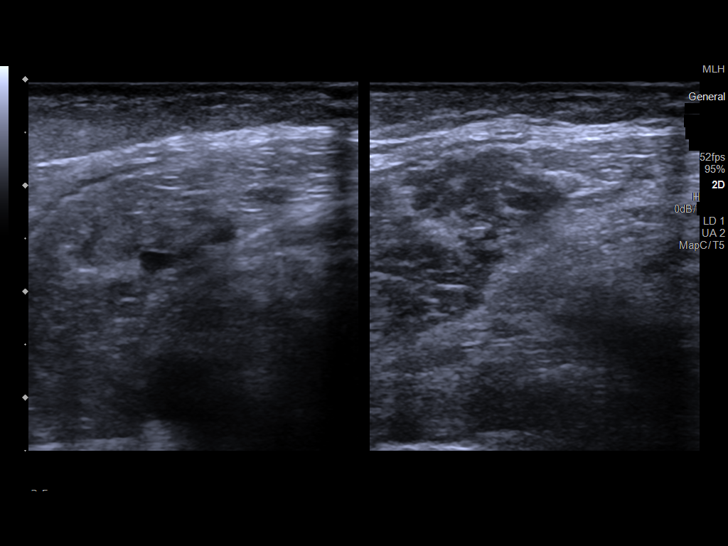
[im 24/43]
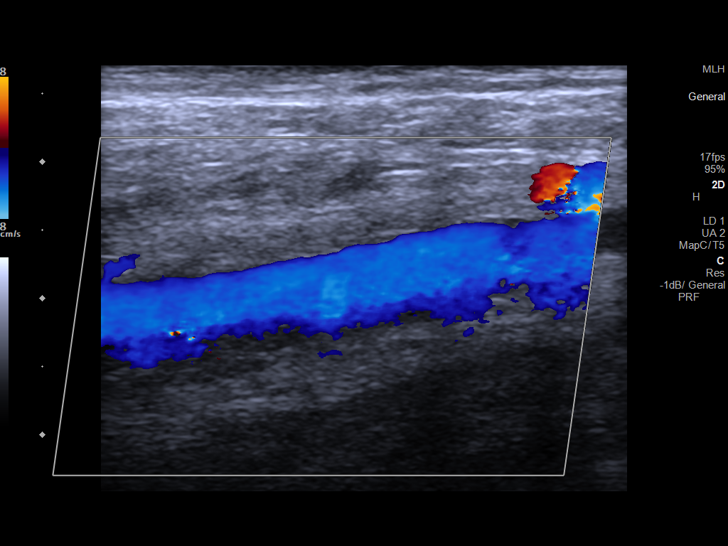
[im 28/43]
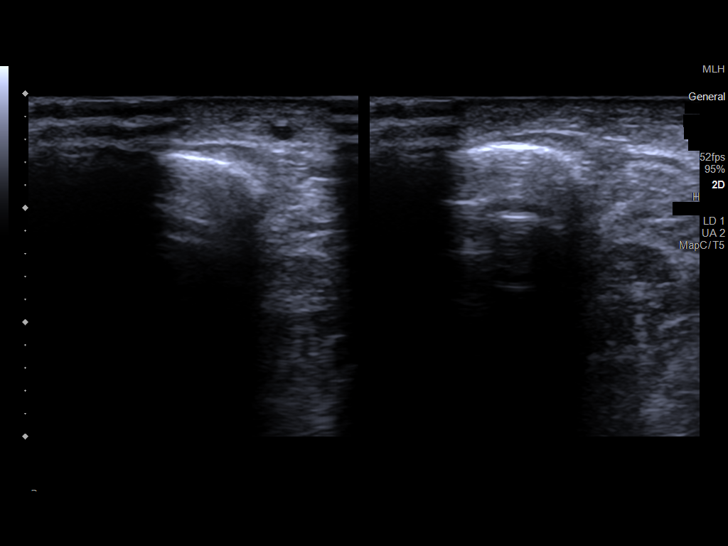
[im 32/43]
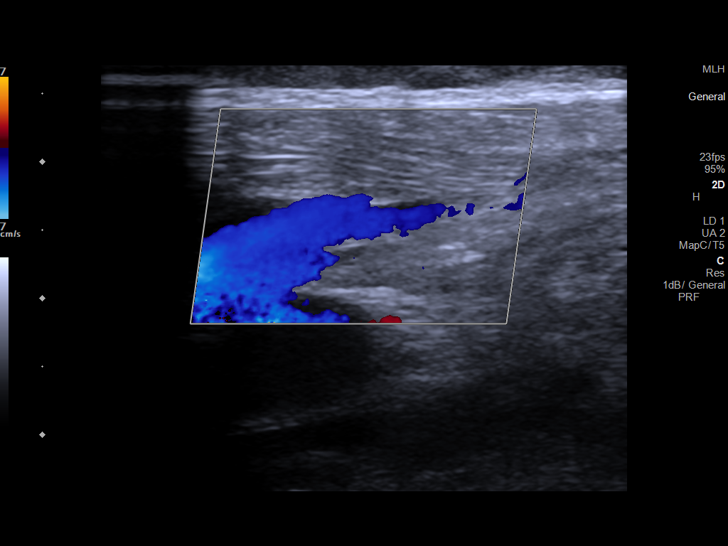
[im 35/43]
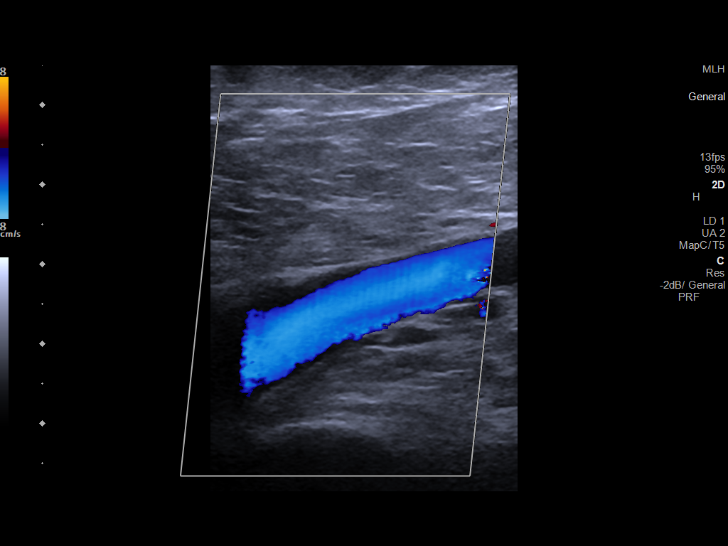
[im 39/43]
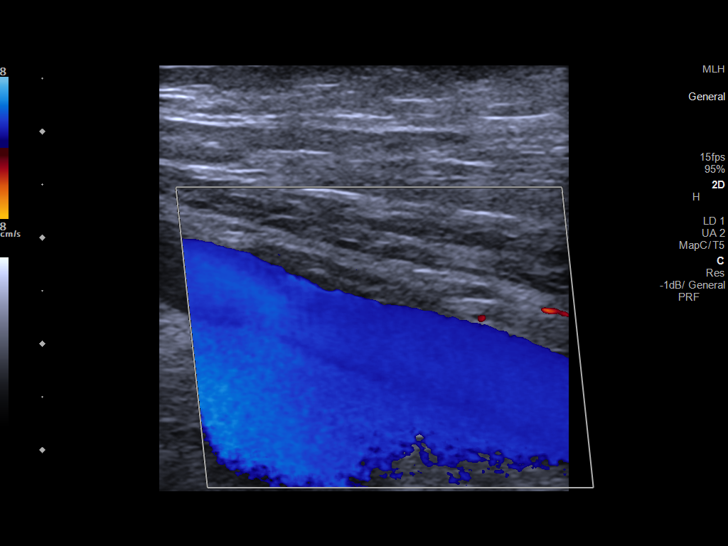
[im 43/43]
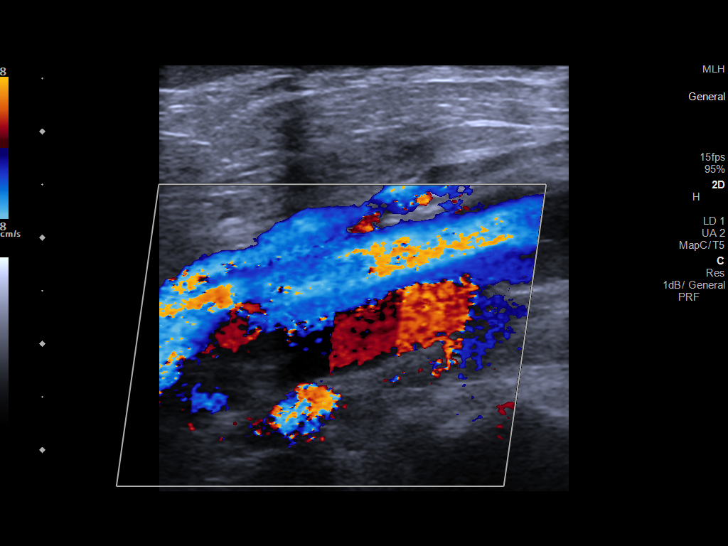

[13 of 24 positions shown; findings below may reference images not displayed]

FINDINGS: Contralateral Common Femoral Vein: Respiratory phasicity is normal
and symmetric with the symptomatic side. No evidence of thrombus.
Normal compressibility.

Common Femoral Vein: No evidence of thrombus. Normal
compressibility, respiratory phasicity and response to augmentation.

Saphenofemoral Junction: No evidence of thrombus. Normal
compressibility and flow on color Doppler imaging.

Profunda Femoral Vein: No evidence of thrombus. Normal
compressibility and flow on color Doppler imaging.

Femoral Vein: No evidence of thrombus. Normal compressibility,
respiratory phasicity and response to augmentation.

Popliteal Vein: No evidence of thrombus. Normal compressibility,
respiratory phasicity and response to augmentation.

Calf Veins: No evidence of thrombus. Normal compressibility and flow
on color Doppler imaging.
IMPRESSION: No evidence of deep venous thrombosis.

## 2022-12-11 ENCOUNTER — Non-Acute Institutional Stay (HOSPITAL_COMMUNITY)
Admission: RE | Admit: 2022-12-11 | Discharge: 2022-12-11 | Disposition: A | Discharge status: Home / Self Care | Payer: Medicare Other | Source: Ambulatory Visit | Attending: Internal Medicine | Admitting: Internal Medicine

## 2022-12-11 VITALS — BP 140/65 | HR 64 | Temp 97.3°F | Resp 16 | Wt 228.0 lb

## 2022-12-11 DIAGNOSIS — K51919 Ulcerative colitis, unspecified with unspecified complications: Secondary | ICD-10-CM | POA: Diagnosis present

## 2022-12-11 LAB — CBC WITH DIFFERENTIAL/PLATELET
Abs Immature Granulocytes: 0.01 10*3/uL (ref 0.00–0.07)
Basophils Absolute: 0 10*3/uL (ref 0.0–0.1)
Basophils Relative: 1 %
Eosinophils Absolute: 0.3 10*3/uL (ref 0.0–0.5)
Eosinophils Relative: 9 %
HCT: 39 % (ref 39.0–52.0)
Hemoglobin: 12.6 g/dL — ABNORMAL LOW (ref 13.0–17.0)
Immature Granulocytes: 0 %
Lymphocytes Relative: 37 %
Lymphs Abs: 1.3 10*3/uL (ref 0.7–4.0)
MCH: 31.3 pg (ref 26.0–34.0)
MCHC: 32.3 g/dL (ref 30.0–36.0)
MCV: 97 fL (ref 80.0–100.0)
Monocytes Absolute: 0.5 10*3/uL (ref 0.1–1.0)
Monocytes Relative: 13 %
Neutro Abs: 1.4 10*3/uL — ABNORMAL LOW (ref 1.7–7.7)
Neutrophils Relative %: 40 %
Platelets: 199 10*3/uL (ref 150–400)
RBC: 4.02 MIL/uL — ABNORMAL LOW (ref 4.22–5.81)
RDW: 15.5 % (ref 11.5–15.5)
WBC: 3.6 10*3/uL — ABNORMAL LOW (ref 4.0–10.5)
nRBC: 0 % (ref 0.0–0.2)

## 2022-12-11 LAB — SEDIMENTATION RATE: Sed Rate: 16 mm/hr (ref 0–16)

## 2022-12-11 LAB — COMPREHENSIVE METABOLIC PANEL
ALT: 28 U/L (ref 0–44)
AST: 30 U/L (ref 15–41)
Albumin: 3.9 g/dL (ref 3.5–5.0)
Alkaline Phosphatase: 70 U/L (ref 38–126)
Anion gap: 7 (ref 5–15)
BUN: 22 mg/dL (ref 8–23)
CO2: 23 mmol/L (ref 22–32)
Calcium: 8.5 mg/dL — ABNORMAL LOW (ref 8.9–10.3)
Chloride: 106 mmol/L (ref 98–111)
Creatinine, Ser: 0.83 mg/dL (ref 0.61–1.24)
GFR, Estimated: >60 mL/min (ref >60)
Glucose, Bld: 108 mg/dL — ABNORMAL HIGH (ref 70–99)
Potassium: 4.6 mmol/L (ref 3.5–5.1)
Sodium: 136 mmol/L (ref 135–145)
Total Bilirubin: 0.6 mg/dL (ref 0.3–1.2)
Total Protein: 7.2 g/dL (ref 6.5–8.1)

## 2022-12-11 MED ORDER — SODIUM CHLORIDE 0.9 % IV SOLN
10.0000 mg/kg | Freq: Once | INTRAVENOUS | Status: AC
  Administered 2022-12-11: 1000 mg via INTRAVENOUS
  Filled 2022-12-11: qty 100

## 2022-12-11 MED ORDER — SODIUM CHLORIDE 0.9 % IV SOLN: INTRAVENOUS | Status: DC | PRN

## 2022-12-11 NOTE — Progress Notes (Signed)
PATIENT CARE CENTER NOTE     Diagnosis: Chronic ulcerative colitis, unspecified complication [K51.919]      Provider: Corliss Parish MD     Procedure: Remicade 10mg /kg and lab draw     Note: Patient received Remicade infusion (dose # 2 of 6) via PIV. No premeds required per orders.  Labs (CBCw/diff, CMP, CRP, Sed Rate) ordered monthly and drawn prior to infusion. Infusion titrated per protocol. Patient tolerated well with no adverse reaction.  Vital signs stable. Patient declined AVS and will schedule next appointment at the front desk.  Patient alert, oriented and ambulatory at discharge.

## 2022-12-12 ENCOUNTER — Other Ambulatory Visit: Payer: Self-pay

## 2022-12-12 DIAGNOSIS — K51019 Ulcerative (chronic) pancolitis with unspecified complications: Secondary | ICD-10-CM

## 2022-12-12 LAB — HIGH SENSITIVITY CRP: CRP, High Sensitivity: 1.71 mg/L (ref 0.00–3.00)

## 2022-12-17 LAB — CALPROTECTIN, FECAL: Calprotectin, Fecal: 447 ug/g — ABNORMAL HIGH (ref 0–120)

## 2022-12-18 ENCOUNTER — Ambulatory Visit (INDEPENDENT_AMBULATORY_CARE_PROVIDER_SITE_OTHER): Payer: Medicare Other | Admitting: *Deleted

## 2022-12-18 DIAGNOSIS — Z0000 Encounter for general adult medical examination without abnormal findings: Secondary | ICD-10-CM | POA: Diagnosis not present

## 2022-12-18 NOTE — Patient Instructions (Signed)
Justin Rosales , Thank you for taking time to come for your Medicare Wellness Visit. I appreciate your ongoing commitment to your health goals. Please review the following plan we discussed and let me know if I can assist you in the future.     This is a list of the screening recommended for you and due dates:  Health Maintenance  Topic Date Due   Zoster (Shingles) Vaccine (1 of 2) Never done   Flu Shot  02/19/2023   Colon Cancer Screening  06/28/2023   Medicare Annual Wellness Visit  12/18/2023   DTaP/Tdap/Td vaccine (2 - Td or Tdap) 03/21/2025   Pneumonia Vaccine  Completed   Hepatitis C Screening  Completed   HPV Vaccine  Aged Out   COVID-19 Vaccine  Discontinued    Next appointment: Follow up in one year for your annual wellness visit.   Preventive Care 6 Years and Older, Male Preventive care refers to lifestyle choices and visits with your health care provider that can promote health and wellness. What does preventive care include? A yearly physical exam. This is also called an annual well check. Dental exams once or twice a year. Routine eye exams. Ask your health care provider how often you should have your eyes checked. Personal lifestyle choices, including: Daily care of your teeth and gums. Regular physical activity. Eating a healthy diet. Avoiding tobacco and drug use. Limiting alcohol use. Practicing safe sex. Taking low doses of aspirin every day. Taking vitamin and mineral supplements as recommended by your health care provider. What happens during an annual well check? The services and screenings done by your health care provider during your annual well check will depend on your age, overall health, lifestyle risk factors, and family history of disease. Counseling  Your health care provider may ask you questions about your: Alcohol use. Tobacco use. Drug use. Emotional well-being. Home and relationship well-being. Sexual activity. Eating habits. History of  falls. Memory and ability to understand (cognition). Work and work Astronomer. Screening  You may have the following tests or measurements: Height, weight, and BMI. Blood pressure. Lipid and cholesterol levels. These may be checked every 5 years, or more frequently if you are over 60 years old. Skin check. Lung cancer screening. You may have this screening every year starting at age 3 if you have a 30-pack-year history of smoking and currently smoke or have quit within the past 15 years. Fecal occult blood test (FOBT) of the stool. You may have this test every year starting at age 87. Flexible sigmoidoscopy or colonoscopy. You may have a sigmoidoscopy every 5 years or a colonoscopy every 10 years starting at age 50. Prostate cancer screening. Recommendations will vary depending on your family history and other risks. Hepatitis C blood test. Hepatitis B blood test. Sexually transmitted disease (STD) testing. Diabetes screening. This is done by checking your blood sugar (glucose) after you have not eaten for a while (fasting). You may have this done every 1-3 years. Abdominal aortic aneurysm (AAA) screening. You may need this if you are a current or former smoker. Osteoporosis. You may be screened starting at age 73 if you are at high risk. Talk with your health care provider about your test results, treatment options, and if necessary, the need for more tests. Vaccines  Your health care provider may recommend certain vaccines, such as: Influenza vaccine. This is recommended every year. Tetanus, diphtheria, and acellular pertussis (Tdap, Td) vaccine. You may need a Td booster every 10 years.  Zoster vaccine. You may need this after age 77. Pneumococcal 13-valent conjugate (PCV13) vaccine. One dose is recommended after age 15. Pneumococcal polysaccharide (PPSV23) vaccine. One dose is recommended after age 67. Talk to your health care provider about which screenings and vaccines you need and  how often you need them. This information is not intended to replace advice given to you by your health care provider. Make sure you discuss any questions you have with your health care provider. Document Released: 08/03/2015 Document Revised: 03/26/2016 Document Reviewed: 05/08/2015 Elsevier Interactive Patient Education  2017 [employer].  Fall Prevention in the Home Falls can cause injuries. They can happen to people of all ages. There are many things you can do to make your home safe and to help prevent falls. What can I do on the outside of my home? Regularly fix the edges of walkways and driveways and fix any cracks. Remove anything that might make you trip as you walk through a door, such as a raised step or threshold. Trim any bushes or trees on the path to your home. Use bright outdoor lighting. Clear any walking paths of anything that might make someone trip, such as rocks or tools. Regularly check to see if handrails are loose or broken. Make sure that both sides of any steps have handrails. Any raised decks and porches should have guardrails on the edges. Have any leaves, snow, or ice cleared regularly. Use sand or salt on walking paths during winter. Clean up any spills in your garage right away. This includes oil or grease spills. What can I do in the bathroom? Use night lights. Install grab bars by the toilet and in the tub and shower. Do not use towel bars as grab bars. Use non-skid mats or decals in the tub or shower. If you need to sit down in the shower, use a plastic, non-slip stool. Keep the floor dry. Clean up any water that spills on the floor as soon as it happens. Remove soap buildup in the tub or shower regularly. Attach bath mats securely with double-sided non-slip rug tape. Do not have throw rugs and other things on the floor that can make you trip. What can I do in the bedroom? Use night lights. Make sure that you have a light by your bed that is easy to  reach. Do not use any sheets or blankets that are too big for your bed. They should not hang down onto the floor. Have a firm chair that has side arms. You can use this for support while you get dressed. Do not have throw rugs and other things on the floor that can make you trip. What can I do in the kitchen? Clean up any spills right away. Avoid walking on wet floors. Keep items that you use a lot in easy-to-reach places. If you need to reach something above you, use a strong step stool that has a grab bar. Keep electrical cords out of the way. Do not use floor polish or wax that makes floors slippery. If you must use wax, use non-skid floor wax. Do not have throw rugs and other things on the floor that can make you trip. What can I do with my stairs? Do not leave any items on the stairs. Make sure that there are handrails on both sides of the stairs and use them. Fix handrails that are broken or loose. Make sure that handrails are as long as the stairways. Check any carpeting to make sure that  it is firmly attached to the stairs. Fix any carpet that is loose or worn. Avoid having throw rugs at the top or bottom of the stairs. If you do have throw rugs, attach them to the floor with carpet tape. Make sure that you have a light switch at the top of the stairs and the bottom of the stairs. If you do not have them, ask someone to add them for you. What else can I do to help prevent falls? Wear shoes that: Do not have high heels. Have rubber bottoms. Are comfortable and fit you well. Are closed at the toe. Do not wear sandals. If you use a stepladder: Make sure that it is fully opened. Do not climb a closed stepladder. Make sure that both sides of the stepladder are locked into place. Ask someone to hold it for you, if possible. Clearly mark and make sure that you can see: Any grab bars or handrails. First and last steps. Where the edge of each step is. Use tools that help you move  around (mobility aids) if they are needed. These include: Canes. Walkers. Scooters. Crutches. Turn on the lights when you go into a dark area. Replace any light bulbs as soon as they burn out. Set up your furniture so you have a clear path. Avoid moving your furniture around. If any of your floors are uneven, fix them. If there are any pets around you, be aware of where they are. Review your medicines with your doctor. Some medicines can make you feel dizzy. This can increase your chance of falling. Ask your doctor what other things that you can do to help prevent falls. This information is not intended to replace advice given to you by your health care provider. Make sure you discuss any questions you have with your health care provider. Document Released: 05/03/2009 Document Revised: 12/13/2015 Document Reviewed: 08/11/2014 Elsevier Interactive Patient Education  2017 [employer].

## 2022-12-18 NOTE — Progress Notes (Signed)
Subjective:  Pt completed ADLs, Fall risk, and SDOH during e-check in on 12/11/22.  Answers verified with pt.    Justin Rosales is a 72 y.o. male who presents for Medicare Annual/Subsequent preventive examination.  I connected with  Cherylann Parr on 12/18/22 by a audio enabled telemedicine application and verified that I am speaking with the correct person using two identifiers.  Patient Location: Home  Provider Location: Office/Clinic  I discussed the limitations of evaluation and management by telemedicine. The patient expressed understanding and agreed to proceed.   Review of Systems     Cardiac Risk Factors include: advanced age (>45men, >55 women);male gender;dyslipidemia;hypertension;obesity (BMI >30kg/m2)     Objective:    There were no vitals filed for this visit. There is no height or weight on file to calculate BMI.     12/18/2022   10:21 AM 11/24/2022    8:06 AM 07/25/2022    3:20 PM 03/26/2022    8:52 AM 12/19/2021    9:58 AM 10/01/2021    3:10 PM 09/23/2021    8:55 AM  Advanced Directives  Does Patient Have a Medical Advance Directive? No Yes Yes No No Yes No  Type of Special educational needs teacher of [place];Living will Living will;Healthcare Power of [employer] Power of Attorney   Does patient want to make changes to medical advance directive?   No - Patient declined No - Patient declined     Copy of Healthcare Power of Attorney in Chart?  No - copy requested No - copy requested   No - copy requested   Would patient like information on creating a medical advance directive? No - Patient declined  No - Patient declined  No - Patient declined  No - Patient declined    Current Medications (verified) Outpatient Encounter Medications as of 12/18/2022  Medication Sig   amLODipine-valsartan (EXFORGE) 5-160 MG tablet Take 1 tablet by mouth daily.   aspirin 81 MG EC tablet Take 1 tablet (81 mg total) by mouth daily. Swallow whole.   Bepotastine Besilate 1.5 %  SOLN 1 drop 2 (two) times daily.   Cholecalciferol (DIALYVITE VITAMIN D 5000) 125 MCG (5000 UT) capsule Take 5,000 Units by mouth daily.   diphenhydrAMINE HCl, Sleep, (ZZZQUIL PO) Take 10 mLs by mouth daily as needed (sleep).   folic acid (FOLVITE) 1 MG tablet TAKE 2 TABLETS BY MOUTH DAILY   inFLIXimab in sodium chloride 0.9 % Inject into the vein. 01/16/2021 To start taking every 4 weeks.   mercaptopurine (PURINETHOL) 50 MG tablet TAKE 1 AND 1/2 TABLETS BY  MOUTH DAILY   Multiple Vitamin (MULTIVITAMIN WITH MINERALS) TABS tablet Take 1 tablet by mouth daily.   NON FORMULARY Take 1 tablet by mouth daily. Vital reds-energy supplement   rosuvastatin (CRESTOR) 20 MG tablet Take 1 tablet (20 mg total) by mouth daily.   Saccharomyces boulardii (FLORASTOR PO) Take 1 capsule by mouth in the morning and at bedtime.   sildenafil (VIAGRA) 100 MG tablet Take 1 tablet (100 mg total) by mouth daily as needed for erectile dysfunction.   Facility-Administered Encounter Medications as of 12/18/2022  Medication   0.9 %  sodium chloride infusion    Allergies (verified) Patient has no known allergies.   History: Past Medical History:  Diagnosis Date   Allergy    Anemia    Arthritis    Blood transfusion without reported diagnosis    C. 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 Dr Loman Chroman possibly With [facility] x2. last one done around 2012    Family History  Problem Relation Age of Onset   Hypertension Mother     Diabetes Mother    Alzheimer's disease Mother    Arthritis Mother    Parkinson's disease Father    Alzheimer's disease Father    Colon cancer Neg Hx    Esophageal cancer Neg Hx    Rectal cancer Neg Hx    Stomach cancer Neg Hx    Social History   Socioeconomic History   Marital status: Married    Spouse name: [name]   Number of children: 2   Years of education: Not on file   Highest education level: Some college, no degree  Occupational History   Occupation: Estate agent   Occupation: retired  Tobacco Use   Smoking status: Former    Packs/day: 1.00    Years: 30.00    Additional pack years: 0.00    Total pack years: 30.00    Types: Cigarettes    Quit date: 2001    Years since quitting: 23.4   Smokeless tobacco: Never  Vaping Use   Vaping Use: Never used  Substance and Sexual Activity   Alcohol use: Yes    Alcohol/week: 1.0 standard drink of alcohol    Types: 1 Cans of beer per week    Comment: ocassionally   Drug use: Not Currently   Sexual activity: Yes    Birth control/protection: None  Other Topics Concern   Not on file  Social History Narrative   Not on file   Social Determinants of Health   Financial Resource Strain: Low Risk  (12/11/2022)   Overall Financial Resource Strain (CARDIA)    Difficulty of Paying Living Expenses: Not hard at all  Food Insecurity: No Food Insecurity (12/11/2022)   Hunger Vital Sign    Worried About Running Out of Food in the Last Year: Never true    Ran Out of Food in the Last Year: Never true  Transportation Needs: No Transportation Needs (12/11/2022)   PRAPARE - Administrator, Civil Service (Medical): No    Lack of Transportation (Non-Medical): No  Physical Activity: Sufficiently Active (12/11/2022)   Exercise Vital Sign    Days of Exercise per Week: 4 days    Minutes of Exercise per Session: 50 min  Stress: No Stress Concern Present (12/11/2022)   [employer] of Occupational Health - Occupational  Stress Questionnaire    Feeling of Stress : Not at all  Social Connections: Socially Integrated (12/11/2022)   Social Connection and Isolation Panel [NHANES]    Frequency of Communication with Friends and Family: More than three times a week    Frequency of Social Gatherings with Friends and Family: Twice a week    Attends Religious Services: More than 4 times per year    Active Member of [employer] or Organizations: Yes    Attends Banker Meetings: More than 4 times per year    Marital  Status: Married    Tobacco Counseling Counseling given: Not Answered   Clinical Intake:  Pre-visit preparation completed: Yes  Pain : No/denies pain  Nutritional Risks: None Diabetes: No  How often do you need to have someone help you when you read instructions, pamphlets, or other written materials from your doctor or pharmacy?: 1 - Never   Activities of Daily Living    12/11/2022    8:37 AM  In your present state of health, do you have any difficulty performing the following activities:  Hearing? 0  Vision? 0  Difficulty concentrating or making decisions? 0  Walking or climbing stairs? 0  Dressing or bathing? 0  Doing errands, shopping? 0  Preparing Food and eating ? N  Using the Toilet? N  In the past six months, have you accidently leaked urine? N  Do you have problems with loss of bowel control? N  Managing your Medications? N  Managing your Finances? N  Housekeeping or managing your Housekeeping? N    Patient Care Team: Sharlene Dory, DO as PCP - General (Family Medicine)  Indicate any recent Medical Services you may have received from other than [facility] providers in the past year (date may be approximate).     Assessment:   This is a routine wellness examination for [place].  Hearing/Vision screen No results found.  Dietary issues and exercise activities discussed: Current Exercise Habits: Home exercise routine, Type of exercise: walking, Time (Minutes):  50, Frequency (Times/Week): 4, Weekly Exercise (Minutes/Week): 200, Intensity: Moderate, Exercise limited by: None identified   Goals Addressed   None    Depression Screen    12/18/2022   10:23 AM 06/02/2022    6:59 AM 10/01/2021    3:09 PM 12/07/2020    8:19 AM  PHQ 2/9 Scores  PHQ - 2 Score 0 0 0 0  PHQ- 9 Score  0      Fall Risk    12/11/2022    8:37 AM 06/02/2022    6:59 AM 10/01/2021    3:11 PM  Fall Risk   Falls in the past year? 0 0 1  Number falls in past yr: 0 0 1  Injury with Fall? 0 0 1  Comment   skined left arm  Risk for fall due to : No Fall Risks No Fall Risks Impaired vision  Follow up Falls evaluation completed Falls evaluation completed Falls prevention discussed    FALL RISK PREVENTION PERTAINING TO THE HOME:  Any stairs in or around the home? Yes  If so, are there any without handrails? No  Home free of loose throw rugs in walkways, pet beds, electrical cords, etc? Yes  Adequate lighting in your home to reduce risk of falls? Yes   ASSISTIVE DEVICES UTILIZED TO PREVENT FALLS:  Life alert? No  Use of a cane, walker or w/c? No  Grab bars in the bathroom? Yes  Shower chair or bench in shower? No  Elevated toilet seat or a handicapped toilet? Yes   TIMED UP AND GO:  Was the test performed?  No, audio visit .    Cognitive Function:        12/18/2022   10:25 AM 10/01/2021    3:13 PM  6CIT Screen  What Year? 0 points 0 points  What month? 0 points 0 points  What time? 0 points 0 points  Count back from 20 0 points 0 points  Months in reverse 0 points 0 points  Repeat phrase 0 points 0  points  Total Score 0 points 0 points    Immunizations Immunization History  Administered Date(s) Administered   Fluad Quad(high Dose 65+) 05/10/2019, 05/09/2021   Influenza-Unspecified 04/20/2018   PFIZER(Purple Top)SARS-COV-2 Vaccination 09/02/2019, 09/27/2019   PNEUMOCOCCAL CONJUGATE-20 05/09/2021   Pneumococcal Polysaccharide-23 10/04/2018   Tdap  03/22/2015   Zoster, Live 03/21/2014    TDAP status: Up to date  Flu Vaccine status: Up to date  Pneumococcal vaccine status: Up to date  Covid-19 vaccine status: Declined, Education has been provided regarding the importance of this vaccine but patient still declined. Advised may receive this vaccine at local pharmacy or Health Dept.or vaccine clinic. Aware to provide a copy of the vaccination record if obtained from local pharmacy or Health Dept. Verbalized acceptance and understanding.  Qualifies for Shingles Vaccine? Yes   Zostavax completed Yes   Shingrix Completed?: No.    Education has been provided regarding the importance of this vaccine. Patient has been advised to call insurance company to determine out of pocket expense if they have not yet received this vaccine. Advised may also receive vaccine at local pharmacy or Health Dept. Verbalized acceptance and understanding.  Screening Tests Health Maintenance  Topic Date Due   Zoster Vaccines- Shingrix (1 of 2) Never done   Medicare Annual Wellness (AWV)  10/02/2022   INFLUENZA VACCINE  02/19/2023   Colonoscopy  06/28/2023   DTaP/Tdap/Td (2 - Td or Tdap) 03/21/2025   Pneumonia Vaccine 67+ Years old  Completed   Hepatitis C Screening  Completed   HPV VACCINES  Aged Out   COVID-19 Vaccine  Discontinued    Health Maintenance  Health Maintenance Due  Topic Date Due   Zoster Vaccines- Shingrix (1 of 2) Never done   Medicare Annual Wellness (AWV)  10/02/2022    Colorectal cancer screening: Type of screening: Colonoscopy. Completed 06/27/22. Repeat every 1 years  Lung Cancer Screening: (Low Dose CT Chest recommended if Age 29-80 years, 30 pack-year currently smoking OR have quit w/in 15years.) does not qualify.   Additional Screening:  Hepatitis C Screening: does qualify; Completed 10/04/18  Vision Screening: Recommended annual ophthalmology exams for early detection of glaucoma and other disorders of the eye. Is the  patient up to date with their annual eye exam?  Yes  Who is the provider or what is the name of the office in which the patient attends annual eye exams? Dr. Carlynn Purl If pt is not established with a provider, would they like to be referred to a provider to establish care? No .   Dental Screening: Recommended annual dental exams for proper oral hygiene  Community Resource Referral / Chronic Care Management: CRR required this visit?  No   CCM required this visit?  No      Plan:     I have personally reviewed and noted the following in the patient's chart:   Medical and social history Use of alcohol, tobacco or illicit drugs  Current medications and supplements including opioid prescriptions. Patient is not currently taking opioid prescriptions. Functional ability and status Nutritional status Physical activity Advanced directives List of other physicians Hospitalizations, surgeries, and ER visits in previous 12 months Vitals Screenings to include cognitive, depression, and falls Referrals and appointments  In addition, I have reviewed and discussed with patient certain preventive protocols, quality metrics, and best practice recommendations. A written personalized care plan for preventive services as well as general preventive health recommendations were provided to patient.   Due to this being a telephonic visit,  the after visit summary with patients personalized plan was offered to patient via mail or my-chart. Patient would like to access on my-chart.  Donne Anon, [place]   12/18/2022   Nurse Notes: None

## 2023-01-08 ENCOUNTER — Non-Acute Institutional Stay (HOSPITAL_COMMUNITY)
Admission: RE | Admit: 2023-01-08 | Discharge: 2023-01-08 | Disposition: A | Discharge status: Home / Self Care | Payer: Medicare Other | Source: Ambulatory Visit | Attending: Internal Medicine | Admitting: Internal Medicine

## 2023-01-08 DIAGNOSIS — K51919 Ulcerative colitis, unspecified with unspecified complications: Secondary | ICD-10-CM | POA: Insufficient documentation

## 2023-01-08 MED ORDER — SODIUM CHLORIDE 0.9 % IV SOLN
10.0000 mg/kg | INTRAVENOUS | Status: DC
  Administered 2023-01-08: 1000 mg via INTRAVENOUS
  Filled 2023-01-08: qty 100

## 2023-01-08 MED ORDER — SODIUM CHLORIDE 0.9 % IV SOLN: INTRAVENOUS | Status: DC | PRN

## 2023-01-08 NOTE — Progress Notes (Signed)
PATIENT CARE CENTER NOTE     Diagnosis: Chronic ulcerative colitis, unspecified complication [K51.919]      Provider: Corliss Parish MD     Procedure: Remicade 10mg /kg     Note: Patient received Remicade infusion (dose # 3 of 6) via PIV. No premeds required per orders.  Labs ordered for every 2 months and were drawn during last month's infusion. Infusion titrated per protocol. Patient tolerated well with no adverse reaction.  Vital signs stable. Patient declined AVS and will schedule next appointment at the front desk.  Patient alert, oriented and ambulatory at discharge.

## 2023-02-05 ENCOUNTER — Non-Acute Institutional Stay (HOSPITAL_COMMUNITY)
Admission: RE | Admit: 2023-02-05 | Discharge: 2023-02-05 | Disposition: A | Discharge status: Home / Self Care | Payer: Medicare Other | Source: Ambulatory Visit | Attending: Internal Medicine | Admitting: Internal Medicine

## 2023-02-05 DIAGNOSIS — K51919 Ulcerative colitis, unspecified with unspecified complications: Secondary | ICD-10-CM | POA: Insufficient documentation

## 2023-02-05 DIAGNOSIS — K51019 Ulcerative (chronic) pancolitis with unspecified complications: Secondary | ICD-10-CM

## 2023-02-05 LAB — CBC WITH DIFFERENTIAL/PLATELET
Abs Immature Granulocytes: 0.01 10*3/uL (ref 0.00–0.07)
Basophils Absolute: 0 10*3/uL (ref 0.0–0.1)
Basophils Relative: 1 %
Eosinophils Absolute: 0.3 10*3/uL (ref 0.0–0.5)
Eosinophils Relative: 7 %
HCT: 41.4 % (ref 39.0–52.0)
Hemoglobin: 13.4 g/dL (ref 13.0–17.0)
Immature Granulocytes: 0 %
Lymphocytes Relative: 35 %
Lymphs Abs: 1.3 10*3/uL (ref 0.7–4.0)
MCH: 31.2 pg (ref 26.0–34.0)
MCHC: 32.4 g/dL (ref 30.0–36.0)
MCV: 96.5 fL (ref 80.0–100.0)
Monocytes Absolute: 0.5 10*3/uL (ref 0.1–1.0)
Monocytes Relative: 13 %
Neutro Abs: 1.7 10*3/uL (ref 1.7–7.7)
Neutrophils Relative %: 44 %
Platelets: 216 10*3/uL (ref 150–400)
RBC: 4.29 MIL/uL (ref 4.22–5.81)
RDW: 14.6 % (ref 11.5–15.5)
WBC: 3.8 10*3/uL — ABNORMAL LOW (ref 4.0–10.5)
nRBC: 0 % (ref 0.0–0.2)

## 2023-02-05 LAB — COMPREHENSIVE METABOLIC PANEL
ALT: 29 U/L (ref 0–44)
AST: 24 U/L (ref 15–41)
Albumin: 4 g/dL (ref 3.5–5.0)
Alkaline Phosphatase: 70 U/L (ref 38–126)
Anion gap: 7 (ref 5–15)
BUN: 18 mg/dL (ref 8–23)
CO2: 25 mmol/L (ref 22–32)
Calcium: 8.9 mg/dL (ref 8.9–10.3)
Chloride: 105 mmol/L (ref 98–111)
Creatinine, Ser: 0.81 mg/dL (ref 0.61–1.24)
GFR, Estimated: >60 mL/min (ref >60)
Glucose, Bld: 99 mg/dL (ref 70–99)
Potassium: 3.8 mmol/L (ref 3.5–5.1)
Sodium: 137 mmol/L (ref 135–145)
Total Bilirubin: 0.5 mg/dL (ref 0.3–1.2)
Total Protein: 7.6 g/dL (ref 6.5–8.1)

## 2023-02-05 LAB — SEDIMENTATION RATE: Sed Rate: 18 mm/hr — ABNORMAL HIGH (ref 0–16)

## 2023-02-05 LAB — C-REACTIVE PROTEIN: CRP: 0.7 mg/dL (ref <1.0)

## 2023-02-05 MED ORDER — SODIUM CHLORIDE 0.9 % IV SOLN: INTRAVENOUS | Status: DC | PRN

## 2023-02-05 MED ORDER — SODIUM CHLORIDE 0.9 % IV SOLN
10.0000 mg/kg | Freq: Once | INTRAVENOUS | Status: AC
  Administered 2023-02-05: 1000 mg via INTRAVENOUS
  Filled 2023-02-05: qty 100

## 2023-02-05 NOTE — Progress Notes (Signed)
PATIENT CARE CENTER NOTE     Diagnosis: Chronic ulcerative colitis, unspecified complication [K51.919]      Provider: Corliss Parish MD     Procedure: Remicade 10mg /kg infusion and lab work     Note: Patient received Remicade infusion (dose # 4 of 6) via PIV. No premeds required per orders.  Labs drawn (CBC w/diff, CMP, CRP, Sed Rate) prior to infusion and will be due again in 2 months. Infusion titrated per protocol. Patient tolerated well with no adverse reaction.  Vital signs stable. Patient declined AVS and already has next appointment scheduled.  Patient alert, oriented and ambulatory at discharge.

## 2023-03-05 ENCOUNTER — Non-Acute Institutional Stay (HOSPITAL_COMMUNITY)
Admission: AD | Admit: 2023-03-05 | Discharge: 2023-03-05 | Disposition: A | Discharge status: Home / Self Care | Payer: Medicare Other | Source: Ambulatory Visit | Attending: Internal Medicine | Admitting: Internal Medicine

## 2023-03-05 DIAGNOSIS — K51919 Ulcerative colitis, unspecified with unspecified complications: Secondary | ICD-10-CM | POA: Insufficient documentation

## 2023-03-05 MED ORDER — SODIUM CHLORIDE 0.9 % IV SOLN: INTRAVENOUS | Status: DC | PRN

## 2023-03-05 MED ORDER — SODIUM CHLORIDE 0.9 % IV SOLN
10.0000 mg/kg | INTRAVENOUS | Status: DC
  Administered 2023-03-05: 1000 mg via INTRAVENOUS
  Filled 2023-03-05: qty 100

## 2023-03-05 NOTE — Progress Notes (Signed)
PATIENT CARE CENTER NOTE  Diagnosis: Chronic ulcerative colitis, unspecified complication [K51.919]    Provider: Corliss Parish, MD  Procedure: Remicade 10mg /kg   Note: Pt received Remicade infusion (dose #5 of 6) via PIV. No pre-medications required per orders. Labs will be drawn at next appointment per orders. Infusion is titrated per protocol. Pt tolerated infusion with no adverse reaction. AVS offered, but pt declined. Pts next appointment already scheduled for September 12th. Patient is alert, oriented, and ambulatory at discharge.

## 2023-03-21 ENCOUNTER — Other Ambulatory Visit: Payer: Self-pay | Admitting: Family Medicine

## 2023-03-21 DIAGNOSIS — I10 Essential (primary) hypertension: Secondary | ICD-10-CM

## 2023-03-21 DIAGNOSIS — I700 Atherosclerosis of aorta: Secondary | ICD-10-CM

## 2023-03-25 ENCOUNTER — Other Ambulatory Visit: Payer: Medicare Other

## 2023-03-25 ENCOUNTER — Ambulatory Visit (INDEPENDENT_AMBULATORY_CARE_PROVIDER_SITE_OTHER): Payer: Medicare Other | Admitting: Gastroenterology

## 2023-03-25 VITALS — BP 144/78 | HR 76 | Ht 70.0 in | Wt 223.0 lb

## 2023-03-25 DIAGNOSIS — K529 Noninfective gastroenteritis and colitis, unspecified: Secondary | ICD-10-CM

## 2023-03-25 DIAGNOSIS — K51019 Ulcerative (chronic) pancolitis with unspecified complications: Secondary | ICD-10-CM | POA: Diagnosis not present

## 2023-03-25 MED ORDER — NA SULFATE-K SULFATE-MG SULF 17.5-3.13-1.6 GM/177ML PO SOLN: 1.0000 | ORAL | 0 refills | Status: DC

## 2023-03-25 NOTE — Patient Instructions (Signed)
Your provider has requested that you go to the basement level for lab work Fecal Calprotectin on 05/20/23 and Infliximab Trough Level on 06/25/23 . Press "B" on the elevator. The lab is located at the first door on the left as you exit the elevator.   We have sent the following medications to your pharmacy for you to pick up at your convenience: Suprep   You have been scheduled for a colonoscopy. Please follow written instructions given to you at your visit today.   Please pick up your prep supplies at the pharmacy within the next 1-3 days.  If you use inhalers (even only as needed), please bring them with you on the day of your procedure.  DO NOT TAKE 7 DAYS PRIOR TO TEST- Trulicity (dulaglutide) Ozempic, Wegovy (semaglutide) Mounjaro (tirzepatide) Bydureon Bcise (exanatide extended release)  DO NOT TAKE 1 DAY PRIOR TO YOUR TEST Rybelsus (semaglutide) Adlyxin (lixisenatide) Victoza (liraglutide) Byetta (exanatide) ___________________________________________________________________________  Due to recent changes in healthcare laws, you may see the results of your imaging and laboratory studies on MyChart before your provider has had a chance to review them.  We understand that in some cases there may be results that are confusing or concerning to you. Not all laboratory results come back in the same time frame and the provider may be waiting for multiple results in order to interpret others.  Please give [place] 48 hours in order for your provider to thoroughly review all the results before contacting the office for clarification of your results.   Thank you for choosing me and  Gastroenterology.  Dr. Meridee Score

## 2023-03-27 ENCOUNTER — Encounter: Payer: Self-pay | Admitting: Hematology & Oncology

## 2023-03-27 ENCOUNTER — Inpatient Hospital Stay (HOSPITAL_BASED_OUTPATIENT_CLINIC_OR_DEPARTMENT_OTHER): Payer: Medicare Other | Admitting: Hematology & Oncology

## 2023-03-27 ENCOUNTER — Other Ambulatory Visit: Payer: Self-pay

## 2023-03-27 ENCOUNTER — Inpatient Hospital Stay: Payer: Medicare Other | Attending: Hematology & Oncology

## 2023-03-27 ENCOUNTER — Encounter: Payer: Self-pay | Admitting: *Deleted

## 2023-03-27 VITALS — BP 156/72 | HR 78 | Temp 98.2°F | Resp 18 | Ht 70.0 in | Wt 224.8 lb

## 2023-03-27 DIAGNOSIS — I82431 Acute embolism and thrombosis of right popliteal vein: Secondary | ICD-10-CM | POA: Diagnosis not present

## 2023-03-27 DIAGNOSIS — D631 Anemia in chronic kidney disease: Secondary | ICD-10-CM | POA: Diagnosis not present

## 2023-03-27 DIAGNOSIS — K909 Intestinal malabsorption, unspecified: Secondary | ICD-10-CM

## 2023-03-27 DIAGNOSIS — N189 Chronic kidney disease, unspecified: Secondary | ICD-10-CM | POA: Insufficient documentation

## 2023-03-27 DIAGNOSIS — D500 Iron deficiency anemia secondary to blood loss (chronic): Secondary | ICD-10-CM | POA: Diagnosis present

## 2023-03-27 DIAGNOSIS — K51911 Ulcerative colitis, unspecified with rectal bleeding: Secondary | ICD-10-CM | POA: Insufficient documentation

## 2023-03-27 LAB — CMP (CANCER CENTER ONLY)
ALT: 24 U/L (ref 0–44)
AST: 28 U/L (ref 15–41)
Albumin: 4.3 g/dL (ref 3.5–5.0)
Alkaline Phosphatase: 72 U/L (ref 38–126)
Anion gap: 3 — ABNORMAL LOW (ref 5–15)
BUN: 14 mg/dL (ref 8–23)
CO2: 29 mmol/L (ref 22–32)
Calcium: 9.8 mg/dL (ref 8.9–10.3)
Chloride: 103 mmol/L (ref 98–111)
Creatinine: 0.87 mg/dL (ref 0.61–1.24)
GFR, Estimated: >60 mL/min (ref >60)
Glucose, Bld: 115 mg/dL — ABNORMAL HIGH (ref 70–99)
Potassium: 4.6 mmol/L (ref 3.5–5.1)
Sodium: 135 mmol/L (ref 135–145)
Total Bilirubin: 0.6 mg/dL (ref 0.3–1.2)
Total Protein: 7.3 g/dL (ref 6.5–8.1)

## 2023-03-27 LAB — CBC WITH DIFFERENTIAL (CANCER CENTER ONLY)
Abs Immature Granulocytes: 0.03 10*3/uL (ref 0.00–0.07)
Basophils Absolute: 0 10*3/uL (ref 0.0–0.1)
Basophils Relative: 1 %
Eosinophils Absolute: 0.3 10*3/uL (ref 0.0–0.5)
Eosinophils Relative: 8 %
HCT: 40.2 % (ref 39.0–52.0)
Hemoglobin: 13.4 g/dL (ref 13.0–17.0)
Immature Granulocytes: 1 %
Lymphocytes Relative: 34 %
Lymphs Abs: 1.4 10*3/uL (ref 0.7–4.0)
MCH: 31.5 pg (ref 26.0–34.0)
MCHC: 33.3 g/dL (ref 30.0–36.0)
MCV: 94.4 fL (ref 80.0–100.0)
Monocytes Absolute: 0.5 10*3/uL (ref 0.1–1.0)
Monocytes Relative: 11 %
Neutro Abs: 1.8 10*3/uL (ref 1.7–7.7)
Neutrophils Relative %: 45 %
Platelet Count: 199 10*3/uL (ref 150–400)
RBC: 4.26 MIL/uL (ref 4.22–5.81)
RDW: 14.3 % (ref 11.5–15.5)
WBC Count: 4 10*3/uL (ref 4.0–10.5)
nRBC: 0 % (ref 0.0–0.2)

## 2023-03-27 LAB — RETICULOCYTES
Immature Retic Fract: 14.3 % (ref 2.3–15.9)
RBC.: 4.23 MIL/uL (ref 4.22–5.81)
Retic Count, Absolute: 83.3 10*3/uL (ref 19.0–186.0)
Retic Ct Pct: 2 % (ref 0.4–3.1)

## 2023-03-27 LAB — IRON AND IRON BINDING CAPACITY (CC-WL,HP ONLY)
Iron: 108 ug/dL (ref 45–182)
Saturation Ratios: 33 % (ref 17.9–39.5)
TIBC: 326 ug/dL (ref 250–450)
UIBC: 218 ug/dL (ref 117–376)

## 2023-03-27 LAB — FERRITIN: Ferritin: 154 ng/mL (ref 24–336)

## 2023-03-27 NOTE — Progress Notes (Signed)
Hematology and Oncology Follow Up Visit  Rodriquez Winkles [id number] May 26, 1951 72 y.o. 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 Wiederholt is back for follow-up.  We last saw him back in May.  At that time, his iron saturation only 14%.  We gave some IV iron.  He did well with this.  He feels well.  He is about to go on vacation again.  He will be going down to [place] for a couple weeks.  He has had no problems with fatigue or weakness.  He has had no flareups of his ulcerative colitis.  He has had no diarrhea.  There is been no melena or bright red blood per rectum.  He is going have a colonoscopy I think in November.  He continues on mercaptopurine and Remicade.  He has had no cough or shortness of breath.  He has had no leg swelling or pain.  He has had no rashes.    Currently, I would say his performance status is probably ECOG 0.    Current Outpatient Medications:    amLODipine-valsartan (EXFORGE) 5-160 MG tablet, TAKE 1 TABLET BY MOUTH DAILY, Disp: 120 tablet, Rfl: 2   aspirin 81 MG EC tablet, Take 1 tablet (81 mg total) by mouth daily. Swallow whole., Disp: 30 tablet, Rfl: 12   Bepotastine Besilate 1.5 % SOLN, 1 drop 2 (two) times daily., Disp: , Rfl:    Cholecalciferol (DIALYVITE VITAMIN D 5000) 125 MCG (5000 UT) capsule, Take 5,000 Units by mouth daily., Disp: , Rfl:    diphenhydrAMINE HCl, Sleep, (ZZZQUIL PO), Take 10 mLs by mouth daily as needed (sleep)., Disp: , Rfl:    folic acid (FOLVITE) 1 MG tablet, TAKE 2 TABLETS BY MOUTH DAILY, Disp: 240 tablet, Rfl: 2   inFLIXimab in sodium chloride 0.9 %, Inject into the vein. 01/16/2021 To start taking every 4 weeks., Disp: ,  Rfl:    mercaptopurine (PURINETHOL) 50 MG tablet, TAKE 1 AND 1/2 TABLETS BY  MOUTH DAILY, Disp: 180 tablet, Rfl: 3   Multiple Vitamin (MULTIVITAMIN WITH MINERALS) TABS tablet, Take 1 tablet by mouth daily., Disp: , Rfl:    NON FORMULARY, Take 1 tablet by mouth daily. Vital reds-energy supplement, Disp: , Rfl:    rosuvastatin (CRESTOR) 20 MG tablet, TAKE 1 TABLET BY MOUTH DAILY, Disp: 120 tablet, Rfl: 2   Saccharomyces boulardii (FLORASTOR PO), Take 1 capsule by mouth in the morning and at bedtime., Disp: , Rfl:    sildenafil (VIAGRA) 100 MG tablet, Take 1 tablet (100 mg total) by mouth daily as needed for erectile dysfunction., Disp: 30 tablet, Rfl: 2   Na Sulfate-K Sulfate-Mg Sulf (SUPREP BOWEL PREP KIT) 17.5-3.13-1.6 GM/177ML SOLN, Take 1 kit by mouth as directed. For colonoscopy prep (Patient not taking: Reported on 03/27/2023), Disp: 354 mL, Rfl: 0  Allergies: No Known Allergies  Past Medical History, Surgical history, Social history, and Family History were reviewed and updated.  Review of Systems: Review of Systems  Constitutional:  Positive for fatigue.  HENT:  Negative.    Eyes: Negative.   Respiratory:  Positive for shortness of breath.   Cardiovascular: Negative.   Gastrointestinal:  Positive for blood in stool and diarrhea.  Endocrine: Negative.   Genitourinary: Negative.    Musculoskeletal: Negative.   Skin: Negative.   Neurological: Negative.   Hematological: Negative.   Psychiatric/Behavioral: Negative.      Physical Exam:  height is 5\' 10"  (1.778 m) and weight is 224 lb 12.8 oz (102 kg). His oral temperature is 98.2 F (36.8 C). His blood pressure is 156/72 (abnormal) and his pulse is 78. His respiration is 18 and oxygen saturation is 97%.   Wt Readings from Last 3 Encounters:  03/27/23 224 lb 12.8 oz (102 kg)  03/25/23 223 lb (101.2 kg)  03/05/23 226 lb (102.5 kg)    Physical Exam Vitals reviewed.  HENT:     Head: Normocephalic and atraumatic.  Eyes:      Pupils: Pupils are equal, round, and reactive to light.  Cardiovascular:     Rate and Rhythm: Normal rate and regular rhythm.     Heart sounds: Normal heart sounds.  Pulmonary:     Effort: Pulmonary effort is normal.     Breath sounds: Normal breath sounds.  Abdominal:     General: Bowel sounds are normal.     Palpations: Abdomen is soft.  Musculoskeletal:        General: No tenderness or deformity. Normal range of motion.     Cervical back: Normal range of motion.  Lymphadenopathy:     Cervical: No cervical adenopathy.  Skin:    General: Skin is warm and dry.     Findings: No erythema or rash.  Neurological:     Mental Status: He is alert and oriented to person, place, and time.  Psychiatric:        Behavior: Behavior normal.        Thought Content: Thought content normal.        Judgment: Judgment normal.    Lab Results  Component Value Date   WBC 4.0 03/27/2023   HGB 13.4 03/27/2023   HCT 40.2 03/27/2023   MCV 94.4 03/27/2023   PLT 199 03/27/2023     Chemistry      Component Value Date/Time   NA 137 02/05/2023 0820   K 3.8 02/05/2023 0820   CL 105 02/05/2023 0820   CO2 25 02/05/2023 0820   BUN 18 02/05/2023 0820   CREATININE 0.81 02/05/2023 0820   CREATININE 1.08 11/24/2022 0751      Component Value Date/Time   CALCIUM 8.9 02/05/2023 0820   ALKPHOS 70 02/05/2023 0820   AST 24 02/05/2023 0820   AST 20 11/24/2022 0751   ALT 29 02/05/2023 0820   ALT 23 11/24/2022 0751   BILITOT 0.5 02/05/2023 0820   BILITOT 0.3 11/24/2022 0751       Impression and Plan: Mr. Dewoody is a very nice 72 year old white male.  He has ulcerative colitis.  He had Clostridium diarrhea.  He became profoundly iron deficient.    I would have to believe that his iron levels should be okay.  His hemoglobin is holding steady.  His MCV is nice and high.  We will going plan to get him back after the Holiday season.  I am happy that he is doing so well.  Hopefully, the colonoscopy does not  show any issues with the colitis.   Josph Macho, MD 9/6/20248:07 AM

## 2023-03-29 ENCOUNTER — Encounter: Payer: Self-pay | Admitting: Gastroenterology

## 2023-03-29 NOTE — Progress Notes (Signed)
GASTROENTEROLOGY OUTPATIENT CLINIC VISIT   Primary Care Provider Sharlene Dory, DO [address] [place] [address] [phone]  Patient Profile: Justin Rosales is a 72 y.o. male with a pmh significant for chronic panulcerative colitis (now on + Remicade 10mg /kg Q4W), prior C. difficile infection (requiring p.o. vancomycin and fidaxomycin), history of Candida esophagitis, chronic anemia with iron deficiency (s/p IV Iron infusions), hypertension, hyperlipidemia, prior VTE (now off AC).  The patient presents to the [facility] for an evaluation and management of problem(s) noted below:  Problem List 1. Ulcerative pancolitis with complication (HCC)   2. IBD (inflammatory bowel disease)     History of Present Illness Please see prior notes for full details of HPI.  Interval History The patient returns for scheduled follow-up (today he is unaccompanied).  Patient states no significant changes since his last clinic visit with [place].  I have seen his laboratories every other infusion and overall things have been relatively stable.  He does still have an elevated ESR and his fecal calprotectin earlier this year still remained elevated but certainly much improved from prior.  He is ready to schedule another colonoscopy to see where things stand this year.  He thinks this will help him and [place] decide upon potential needs for transitioning medications.  He is not moving to [place] at this point.  BMs per day -1-2 (formed to semiformed mostly) Nocturnal BMs -none Blood -none Mucous -none Tenesmus -none Urgency -none Skin Manifestations -none Eye Manifestations -none Joint Manifestations -none  GI Review of Systems Positive as above Negative for dysphagia, odynophagia, pain, nausea, vomiting, early satiety, melena, hematochezia   Review of Systems General: Denies fevers/chills/weight loss unintentionally HEENT: Denies new oral  lesions Cardiovascular: Denies chest pain Pulmonary: Denies shortness of breath Gastroenterological: See HPI Genitourinary: Denies darkened urine Hematological: Bruising is improved from prior now that he is off anticoagulation Dermatological: Denies jaundice Psychological: Mood is stable   Medications Current Outpatient Medications  Medication Sig Dispense Refill   amLODipine-valsartan (EXFORGE) 5-160 MG tablet TAKE 1 TABLET BY MOUTH DAILY 120 tablet 2   aspirin 81 MG EC tablet Take 1 tablet (81 mg total) by mouth daily. Swallow whole. 30 tablet 12   Bepotastine Besilate 1.5 % SOLN 1 drop 2 (two) times daily.     Cholecalciferol (DIALYVITE VITAMIN D 5000) 125 MCG (5000 UT) capsule Take 5,000 Units by mouth daily.     diphenhydrAMINE HCl, Sleep, (ZZZQUIL PO) Take 10 mLs by mouth daily as needed (sleep).     folic acid (FOLVITE) 1 MG tablet TAKE 2 TABLETS BY MOUTH DAILY 240 tablet 2   inFLIXimab in sodium chloride 0.9 % Inject into the vein. 01/16/2021 To start taking every 4 weeks.     mercaptopurine (PURINETHOL) 50 MG tablet TAKE 1 AND 1/2 TABLETS BY  MOUTH DAILY 180 tablet 3   Multiple Vitamin (MULTIVITAMIN WITH MINERALS) TABS tablet Take 1 tablet by mouth daily.     Na Sulfate-K Sulfate-Mg Sulf (SUPREP BOWEL PREP KIT) 17.5-3.13-1.6 GM/177ML SOLN Take 1 kit by mouth as directed. For colonoscopy prep (Patient not taking: Reported on 03/27/2023) 354 mL 0   NON FORMULARY Take 1 tablet by mouth daily. Vital reds-energy supplement     rosuvastatin (CRESTOR) 20 MG tablet TAKE 1 TABLET BY MOUTH DAILY 120 tablet 2   Saccharomyces boulardii (FLORASTOR PO) Take 1 capsule by mouth in the morning and at bedtime.     sildenafil (VIAGRA) 100 MG tablet  Take 1 tablet (100 mg total) by mouth daily as needed for erectile dysfunction. 30 tablet 2   No current facility-administered medications for this visit.    Allergies No Known Allergies  Histories Past Medical History:  Diagnosis Date   Allergy     Anemia    Arthritis    Blood transfusion without reported diagnosis    C. 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 Dr Loman Chroman possibly With [facility] x2. last one done around 2012    Social History   Socioeconomic History   Marital status: Married    Spouse name: [name]   Number of children: 2   Years of education: Not on file   Highest education level: Some college, no degree  Occupational History   Occupation: Estate agent   Occupation: retired  Tobacco Use   Smoking status: Former    Current packs/day: 0.00    Average packs/day: 1 pack/day for 30.0 years (30.0 ttl pk-yrs)    Types: Cigarettes    Start date: 63    Quit date: 2001    Years since quitting: 23.7   Smokeless tobacco: Never  Vaping Use   Vaping status: Never Used  Substance and Sexual Activity   Alcohol use: Yes    Alcohol/week: 1.0 standard drink of alcohol    Types: 1 Cans of beer per week    Comment: ocassionally   Drug use: Not Currently   Sexual activity: Yes    Birth control/protection: None  Other Topics Concern   Not on file  Social History Narrative   Not on file   Social Determinants of Health   Financial Resource Strain: Low Risk  (12/11/2022)   Overall Financial Resource Strain (CARDIA)    Difficulty of Paying Living Expenses: Not hard at all  Food Insecurity: No Food Insecurity (12/11/2022)   Hunger Vital Sign    Worried About Running Out of Food in the Last Year: Never true    Ran Out of  Food in the Last Year: Never true  Transportation Needs: No Transportation Needs (12/11/2022)   PRAPARE - Administrator, Civil Service (Medical): No    Lack of Transportation (Non-Medical): No  Physical Activity: Sufficiently Active (12/11/2022)   Exercise Vital Sign    Days of Exercise per Week: 4 days    Minutes of Exercise per Session: 50 min  Stress: No Stress Concern Present (12/11/2022)   [employer] of Occupational Health - Occupational Stress Questionnaire    Feeling of Stress : Not at all  Social Connections: Socially Integrated (12/11/2022)   Social Connection and Isolation Panel [NHANES]    Frequency of Communication with Friends and Family: More than three times a week    Frequency of Social Gatherings with Friends and Family: Twice a week    Attends Religious Services: More than 4 times per year    Active Member of [employer] or Organizations: Yes    Attends Banker Meetings: More than 4  times per year    Marital Status: Married  Catering manager Violence: Not At Risk (12/18/2022)   Humiliation, Afraid, Rape, and Kick questionnaire    Fear of Current or Ex-Partner: No    Emotionally Abused: No    Physically Abused: No    Sexually Abused: No   Family History  Problem Relation Age of Onset   Hypertension Mother    Diabetes Mother    Alzheimer's disease Mother    Arthritis Mother    Parkinson's disease Father    Alzheimer's disease Father    Colon cancer Neg Hx    Esophageal cancer Neg Hx    Rectal cancer Neg Hx    Stomach cancer Neg Hx    I have reviewed his medical, social, and family history in detail and updated the electronic medical record as necessary.    PHYSICAL EXAMINATION  BP (!) 144/78   Pulse 76   Ht 5\' 10"  (1.778 m)   Wt 223 lb (101.2 kg)   SpO2 98%   BMI 32.00 kg/m  Wt Readings from Last 3 Encounters:  03/27/23 224 lb 12.8 oz (102 kg)  03/25/23 223 lb (101.2 kg)  03/05/23 226 lb (102.5 kg)  GEN: NAD, appears stated  age, accompanied by wife PSYCH: Cooperative, without pressured speech EYE: Conjunctivae pink, sclerae anicteric ENT: MMM CV: Nontachycardic RESP: No audible wheezing GI: NABS, soft, rounded, NT/ND, without rebound MSK/EXT: No lower extremity edema SKIN: No jaundice NEURO:  Alert & Oriented x 3, no focal deficits   REVIEW OF DATA  I reviewed the following data at the time of this encounter:  GI Procedures and Studies  Previously reviewed  Laboratory Studies  Reviewed those in epic and care everywhere  Imaging Studies  No new imaging studies to review    ASSESSMENT  Mr. Madani is a 72 y.o. male with a pmh significant for chronic panulcerative colitis (now on + Remicade 10mg /kg Q4W), prior C. difficile infection (requiring p.o. vancomycin and fidaxomycin), history of Candida esophagitis, chronic anemia with iron deficiency (s/p IV Iron infusions), hypertension, hyperlipidemia, prior VTE (now off AC).  The patient is seen today for evaluation and management of:  1. Ulcerative pancolitis with complication (HCC)   2. IBD (inflammatory bowel disease)    The patient is both clinically and hemodynamically stable at this time.  He has a near clinical remission but endoscopically with still having evidence of mucosal inflammation and elevations in his fecal calprotectin.  We have previously discussed consideration of Entyvio or Stelara or Skyrizi and now there is approval for Cristy Folks so it certainly could be considered as well.  We cannot move his 6-MP dosing any further.  We are going to plan to repeat his colonoscopy this year.  We will get a fecal calprotectin to be drawn right before or within a couple weeks of the colonoscopy being completed.  He is going to get Remicade therapeutic drug monitoring performed either in November or December.  As I have spoken with him and his wife previously my concern is that if we try to transition him and he fails we will we lose the ability to go back to  Remicade, time will tell.  I will likely discuss his case with one of my colleagues as I have previously but for now he does feel well he is not having overt or progressive anemia and he is comfortable with his current infusion scheduling.    The risks and benefits of endoscopic evaluation were discussed  with the patient; these include but are not limited to the risk of perforation, infection, bleeding, missed lesions, lack of diagnosis, severe illness requiring hospitalization, as well as anesthesia and sedation related illnesses.  The patient and/or family is agreeable to proceed.  All patient questions were answered to the best of my ability, and the patient agrees to the aforementioned plan of action with follow-up as indicated.   PLAN  Continue Remicade 10 mg/kg every 4 weeks Continue 6-MP 75 mg daily ESR/CRP/CMP/CBC with every other infusion Proceed with scheduling colonoscopy later this year Obtain fecal calprotectin around the time of his colonoscopy TDM to be drawn in November or December (prior to infusion) Consider Entyvio/Stelara/Skyrizi pending his results from above   Orders Placed This Encounter  Procedures   Calprotectin, Fecal   Infliximab+Ab (Serial Monitor)   Ambulatory referral to Gastroenterology     New Prescriptions   NA SULFATE-K SULFATE-MG SULF (SUPREP BOWEL PREP KIT) 17.5-3.13-1.6 GM/177ML SOLN    Take 1 kit by mouth as directed. For colonoscopy prep   Modified Medications   No medications on file    Planned Follow Up No follow-ups on file.   Total Time in Face-to-Face and in Coordination of Care for patient including independent/personal interpretation/review of prior testing, medical history, examination, medication adjustment, communicating results with the patient directly, and documentation with the EHR is 25 minutes.   Corliss Parish, MD [facility] Office # [phone]

## 2023-03-30 IMAGING — CT CT CHEST SUPER D W/O CM
2 of 5 series · 15 of 36 positions shown, 18 images · non-contrast
Comparison: CT July 11, 2020

CLINICAL DATA: Follow-up pulmonary nodule seen on prior CT

EXAM:
CT CHEST WITHOUT CONTRAST
TECHNIQUE: Multidetector CT imaging of the chest was performed using thin slice
collimation for electromagnetic bronchoscopy planning purposes,
without intravenous contrast.

[Series 4: thins · axial · 0.82mm/px · z∈[-313,-1]mm · 12 of 450 slices shown, 15 images]
[im 30/450  mediastinal]
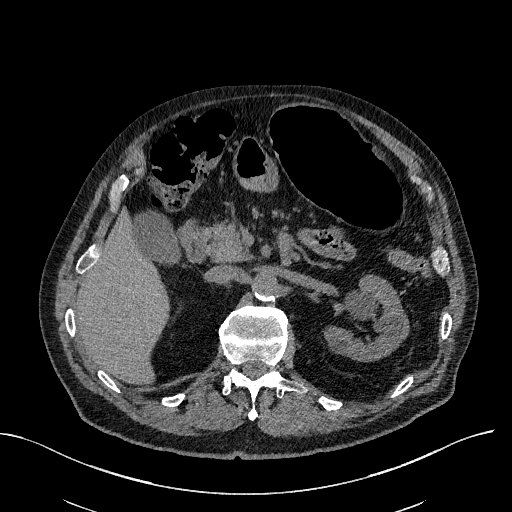
[im 30/450  lung]
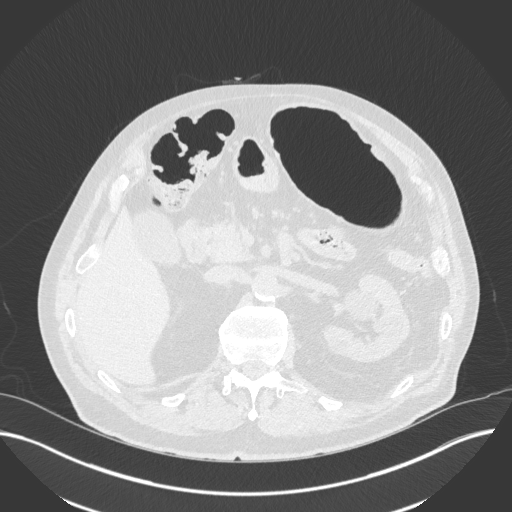
[im 60/450  lung]
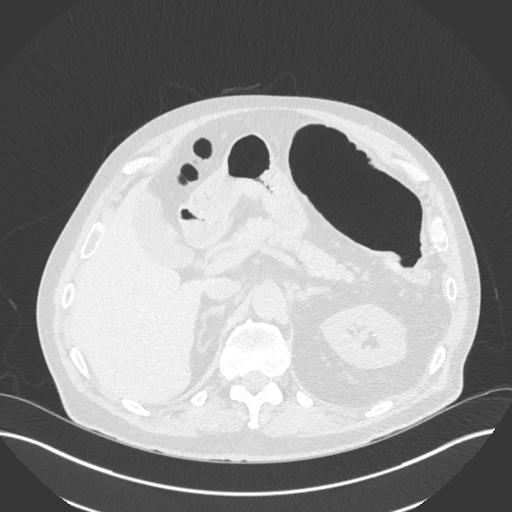
[im 90/450  lung]
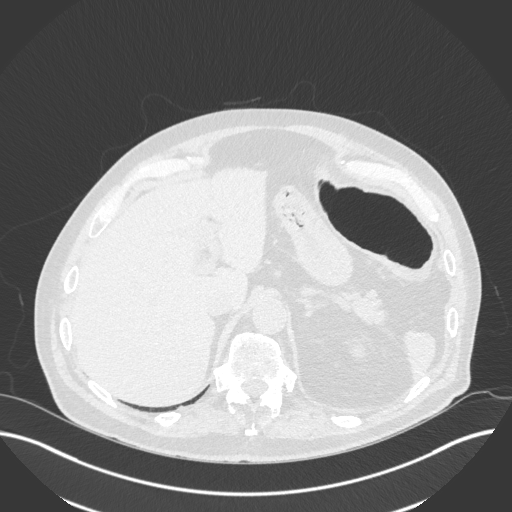
[im 150/450  lung]
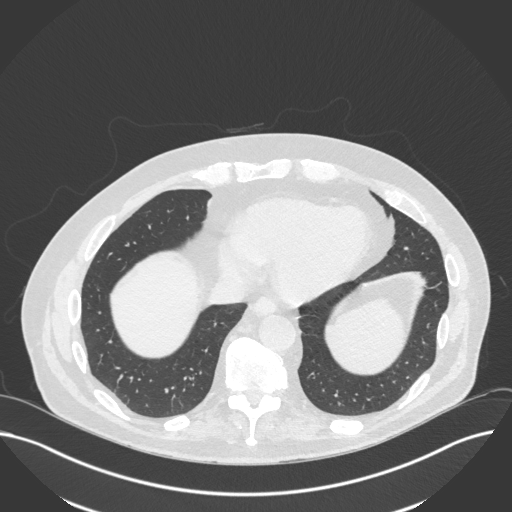
[im 180/450  mediastinal]
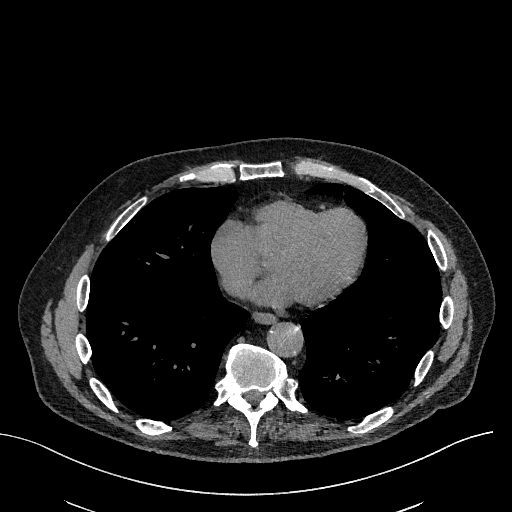
[im 180/450  lung]
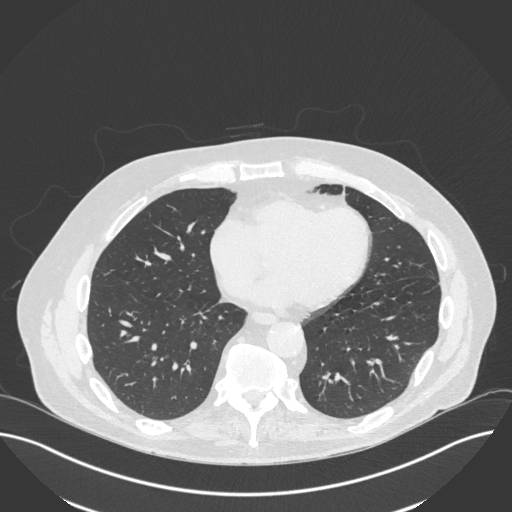
[im 210/450  lung]
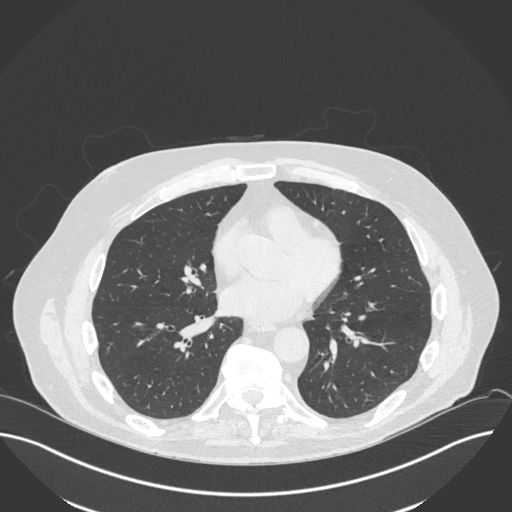
[im 240/450  lung]
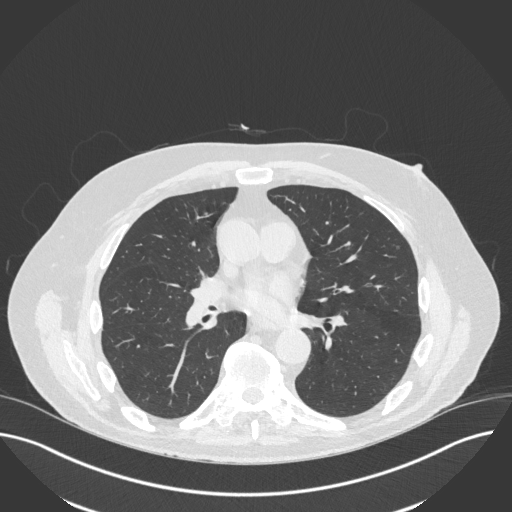
[im 270/450  lung]
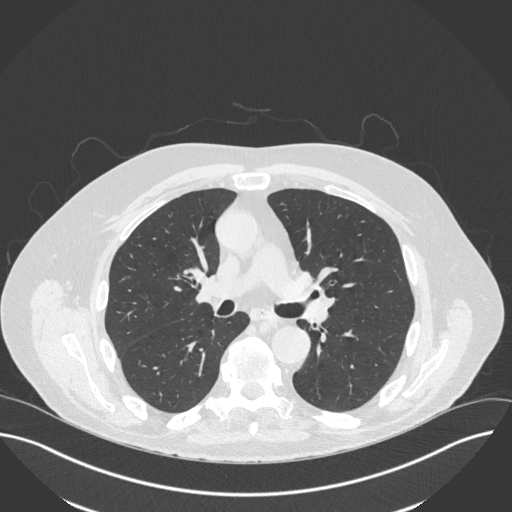
[im 300/450  mediastinal]
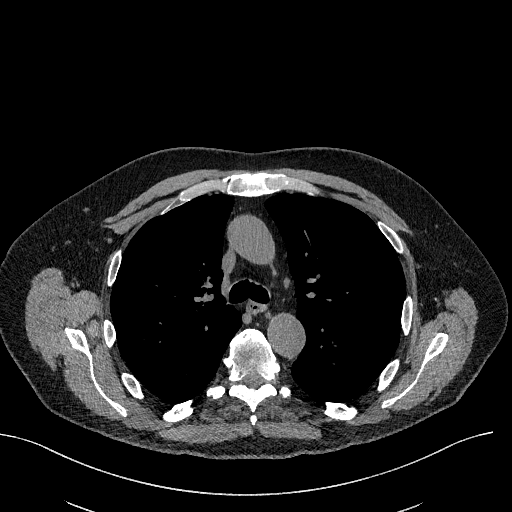
[im 300/450  lung]
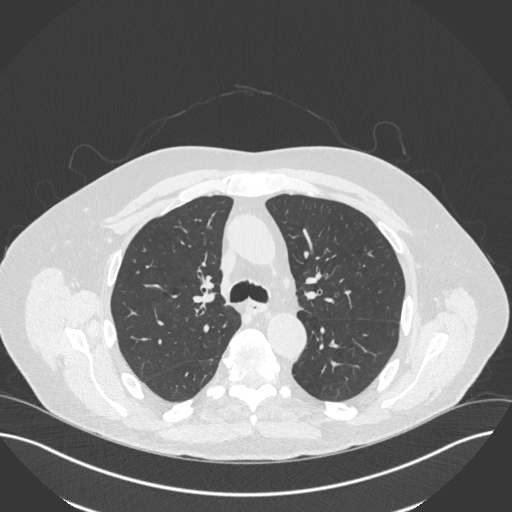
[im 360/450  lung]
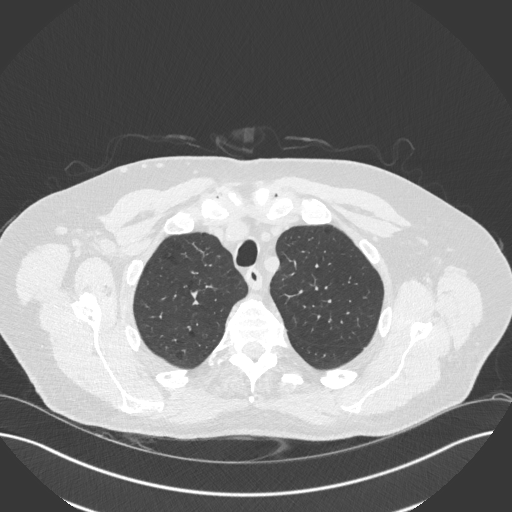
[im 390/450  lung]
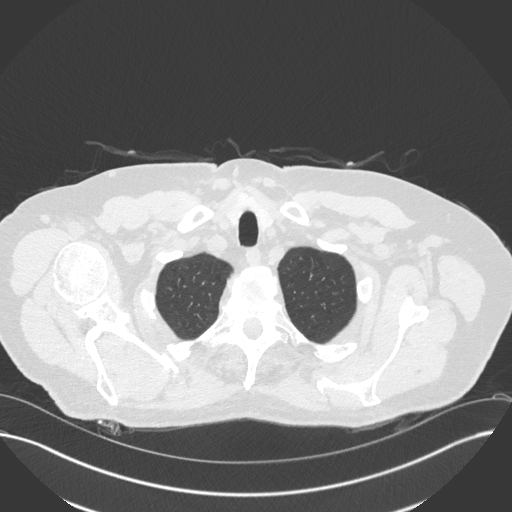
[im 420/450  lung]
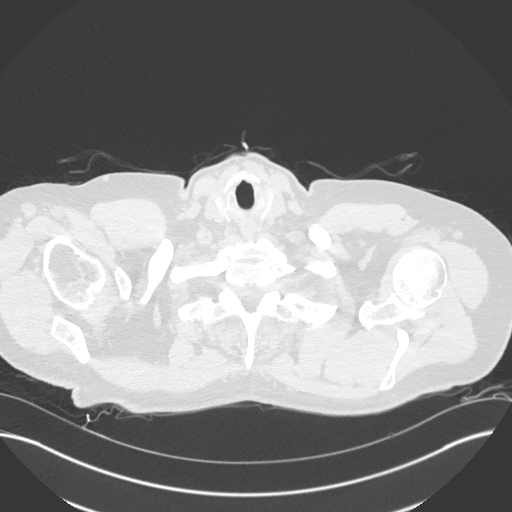

[Series 5: coronal · coronal · 0.70mm/px · 3 of 81 slices shown]
[im 17/81  lung]
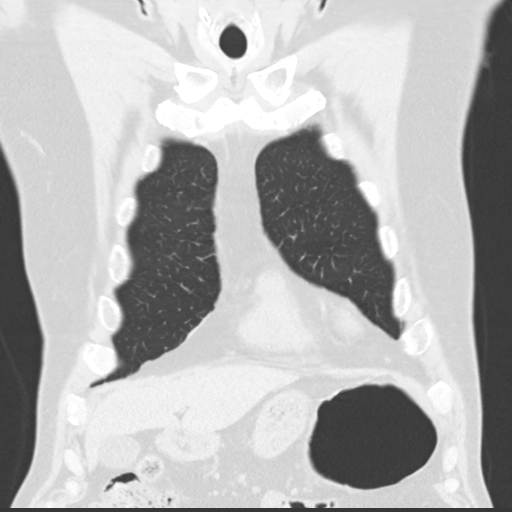
[im 33/81  lung]
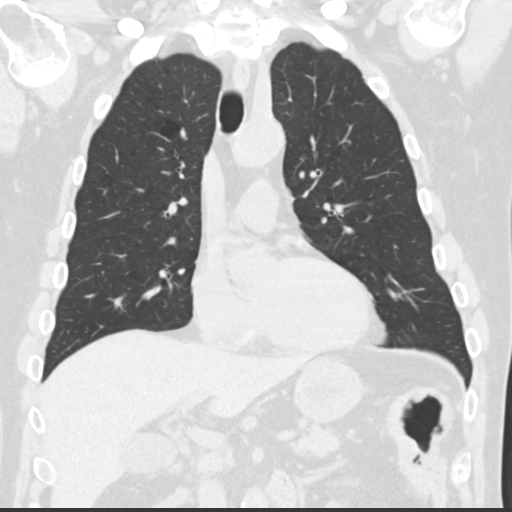
[im 49/81  lung]
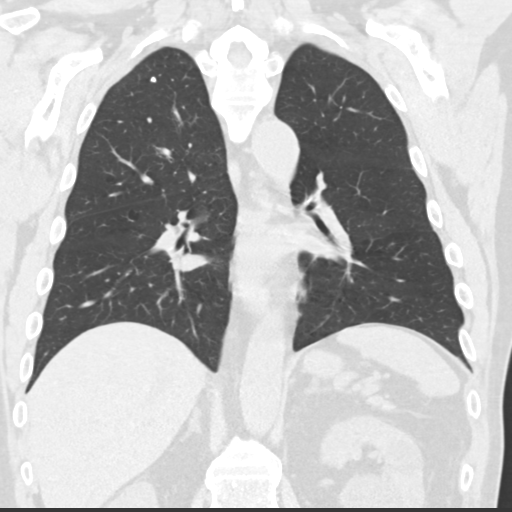

[15 of 36 positions shown; findings below may reference images not displayed]

FINDINGS: Cardiovascular: Aortic atherosclerosis without aneurysmal dilation.
Normal caliber central pulmonary arteries. Left anterior descending
coronary artery calcifications. Normal size heart. No significant
pericardial effusion/thickening.

Mediastinum/Nodes: No discrete thyroid nodule. No pathologically
enlarged mediastinal, hilar or axillary lymph nodes, noting limited
sensitivity for the detection of hilar adenopathy on this
noncontrast study. The trachea and esophagus are grossly
unremarkable.

Lungs/Pleura: Slightly increased confluence of the nodular solid
component of the mixed ground-glass and consolidation in the medial
aspect of the right lower lobe superior segment measuring 12 mm with
a solid component measuring 8 mm. Interval resolution of the 4 mm
subpleural nodule in the apical segment of the right upper lobe.
Right upper lobe calcified granuloma on image [DATE]. Mild
centrilobular emphysema. No new suspicious pulmonary nodules or
masses. No pleural effusion. No pneumothorax.

Upper Abdomen: Left renal cyst.  No acute abnormality.

Musculoskeletal: Thoracic diffuse idiopathic skeletal hyperostosis.
No acute osseous abnormality.
IMPRESSION: 1. Slightly increased confluences of a solid portion of and
irregular sub-solid pulmonary nodule in the medial aspect of the
right lower lobe superior segment, none specific and possibly
infectious or inflammatory but adenocarcinoma is also considered.
Consider follow-up PET-CT for further evaluation, if non
hypermetabolic on PET-CT. Continue surveillance with non contrasted
chest CT would be suggested.
2. Resolution of the 4 mm right upper lobe pulmonary nodule. No new
suspicious pulmonary nodules or masses.
3. Aortic Atherosclerosis (W7AL2-LCC.C) and Emphysema (W7AL2-CGY.G).

## 2023-04-02 ENCOUNTER — Non-Acute Institutional Stay (HOSPITAL_COMMUNITY)
Admission: RE | Admit: 2023-04-02 | Discharge: 2023-04-02 | Disposition: A | Discharge status: Home / Self Care | Payer: Medicare Other | Source: Ambulatory Visit | Attending: Internal Medicine | Admitting: Internal Medicine

## 2023-04-02 VITALS — BP 136/67 | HR 65 | Temp 97.7°F | Resp 16

## 2023-04-02 DIAGNOSIS — K5100 Ulcerative (chronic) pancolitis without complications: Secondary | ICD-10-CM | POA: Diagnosis present

## 2023-04-02 DIAGNOSIS — K51919 Ulcerative colitis, unspecified with unspecified complications: Secondary | ICD-10-CM

## 2023-04-02 LAB — CBC WITH DIFFERENTIAL/PLATELET
Abs Immature Granulocytes: 0.01 10*3/uL (ref 0.00–0.07)
Basophils Absolute: 0 10*3/uL (ref 0.0–0.1)
Basophils Relative: 1 %
Eosinophils Absolute: 0.4 10*3/uL (ref 0.0–0.5)
Eosinophils Relative: 9 %
HCT: 42.4 % (ref 39.0–52.0)
Hemoglobin: 13.5 g/dL (ref 13.0–17.0)
Immature Granulocytes: 0 %
Lymphocytes Relative: 32 %
Lymphs Abs: 1.2 10*3/uL (ref 0.7–4.0)
MCH: 31 pg (ref 26.0–34.0)
MCHC: 31.8 g/dL (ref 30.0–36.0)
MCV: 97.2 fL (ref 80.0–100.0)
Monocytes Absolute: 0.5 10*3/uL (ref 0.1–1.0)
Monocytes Relative: 14 %
Neutro Abs: 1.6 10*3/uL — ABNORMAL LOW (ref 1.7–7.7)
Neutrophils Relative %: 44 %
Platelets: 210 10*3/uL (ref 150–400)
RBC: 4.36 MIL/uL (ref 4.22–5.81)
RDW: 14.6 % (ref 11.5–15.5)
WBC: 3.7 10*3/uL — ABNORMAL LOW (ref 4.0–10.5)
nRBC: 0 % (ref 0.0–0.2)

## 2023-04-02 LAB — COMPREHENSIVE METABOLIC PANEL
ALT: 33 U/L (ref 0–44)
AST: 32 U/L (ref 15–41)
Albumin: 4.1 g/dL (ref 3.5–5.0)
Alkaline Phosphatase: 84 U/L (ref 38–126)
Anion gap: 6 (ref 5–15)
BUN: 17 mg/dL (ref 8–23)
CO2: 23 mmol/L (ref 22–32)
Calcium: 9 mg/dL (ref 8.9–10.3)
Chloride: 108 mmol/L (ref 98–111)
Creatinine, Ser: 0.96 mg/dL (ref 0.61–1.24)
GFR, Estimated: >60 mL/min (ref >60)
Glucose, Bld: 106 mg/dL — ABNORMAL HIGH (ref 70–99)
Potassium: 4.3 mmol/L (ref 3.5–5.1)
Sodium: 137 mmol/L (ref 135–145)
Total Bilirubin: 0.8 mg/dL (ref 0.3–1.2)
Total Protein: 7.7 g/dL (ref 6.5–8.1)

## 2023-04-02 LAB — C-REACTIVE PROTEIN: CRP: <0.5 mg/dL (ref <1.0)

## 2023-04-02 LAB — SEDIMENTATION RATE: Sed Rate: 8 mm/h (ref 0–16)

## 2023-04-02 MED ORDER — SODIUM CHLORIDE 0.9 % IV SOLN
10.0000 mg/kg | INTRAVENOUS | Status: DC
  Administered 2023-04-02: 1000 mg via INTRAVENOUS
  Filled 2023-04-02: qty 100

## 2023-04-02 MED ORDER — SODIUM CHLORIDE 0.9 % IV SOLN: INTRAVENOUS | Status: DC | PRN

## 2023-04-02 NOTE — Progress Notes (Signed)
PATIENT CARE CENTER NOTE     Diagnosis: Chronic ulcerative colitis, unspecified complication [K51.919]      Provider: Corliss Parish MD     Procedure: Remicade 10mg /kg infusion and lab work     Note: Patient received Remicade infusion (dose # 6 of 6) via PIV. No premeds required per orders.  Labs drawn (CBC w/diff, CMP, CRP, Sed Rate) prior to infusion and will be due again in 2 months. Infusion titrated per protocol. Patient tolerated well with no adverse reaction.  Vital signs stable. Patient declined AVS and already has next appointment scheduled.  Patient alert, oriented and ambulatory at discharge.

## 2023-04-17 ENCOUNTER — Other Ambulatory Visit: Payer: Self-pay

## 2023-04-17 DIAGNOSIS — K51019 Ulcerative (chronic) pancolitis with unspecified complications: Secondary | ICD-10-CM

## 2023-04-27 ENCOUNTER — Encounter (HOSPITAL_COMMUNITY): Payer: Medicare Other

## 2023-04-30 ENCOUNTER — Non-Acute Institutional Stay (HOSPITAL_COMMUNITY)
Admission: RE | Admit: 2023-04-30 | Discharge: 2023-04-30 | Disposition: A | Discharge status: Home / Self Care | Payer: Medicare Other | Source: Ambulatory Visit | Attending: Internal Medicine | Admitting: Internal Medicine

## 2023-04-30 ENCOUNTER — Other Ambulatory Visit: Payer: Self-pay

## 2023-04-30 DIAGNOSIS — K51019 Ulcerative (chronic) pancolitis with unspecified complications: Secondary | ICD-10-CM | POA: Diagnosis present

## 2023-04-30 MED ORDER — SODIUM CHLORIDE 0.9 % IV SOLN: INTRAVENOUS | Status: DC | PRN

## 2023-04-30 MED ORDER — SODIUM CHLORIDE 0.9 % IV SOLN
10.0000 mg/kg | INTRAVENOUS | Status: DC
  Administered 2023-04-30: 1000 mg via INTRAVENOUS
  Filled 2023-04-30: qty 100

## 2023-04-30 NOTE — Progress Notes (Signed)
PATIENT CARE CENTER NOTE     Diagnosis:  Ulcerative pancolitis with complication [facility]) [K51.019]      Provider: Corliss Parish, MD     Procedure: Remicade 10mg /kg infusion      Note: Patient received Remicade infusion (dose # 1 of 1) via PIV. No premeds required per orders.  Labs due every 2 months and were drawn last month. Infusion titrated per protocol. Patient tolerated well with no adverse reaction.  Vital signs stable. Patient declined AVS and already has next appointment scheduled.  Patient alert, oriented and ambulatory at discharge.

## 2023-05-12 ENCOUNTER — Encounter: Payer: Self-pay | Admitting: Gastroenterology

## 2023-05-20 ENCOUNTER — Ambulatory Visit: Payer: Medicare Other

## 2023-05-20 ENCOUNTER — Other Ambulatory Visit: Payer: Self-pay

## 2023-05-20 DIAGNOSIS — K51019 Ulcerative (chronic) pancolitis with unspecified complications: Secondary | ICD-10-CM

## 2023-05-20 DIAGNOSIS — K529 Noninfective gastroenteritis and colitis, unspecified: Secondary | ICD-10-CM

## 2023-05-22 ENCOUNTER — Encounter: Payer: Self-pay | Admitting: Gastroenterology

## 2023-05-23 LAB — CALPROTECTIN, FECAL: Calprotectin, Fecal: 490 ug/g — ABNORMAL HIGH (ref 0–120)

## 2023-05-28 ENCOUNTER — Encounter: Payer: Self-pay | Admitting: Gastroenterology

## 2023-05-28 ENCOUNTER — Non-Acute Institutional Stay (HOSPITAL_COMMUNITY)
Admission: RE | Admit: 2023-05-28 | Discharge: 2023-05-28 | Disposition: A | Discharge status: Home / Self Care | Payer: Medicare Other | Source: Ambulatory Visit | Attending: Internal Medicine | Admitting: Internal Medicine

## 2023-05-28 DIAGNOSIS — I700 Atherosclerosis of aorta: Secondary | ICD-10-CM | POA: Insufficient documentation

## 2023-05-28 DIAGNOSIS — D631 Anemia in chronic kidney disease: Secondary | ICD-10-CM | POA: Diagnosis not present

## 2023-05-28 DIAGNOSIS — K909 Intestinal malabsorption, unspecified: Secondary | ICD-10-CM | POA: Insufficient documentation

## 2023-05-28 DIAGNOSIS — K51019 Ulcerative (chronic) pancolitis with unspecified complications: Secondary | ICD-10-CM | POA: Insufficient documentation

## 2023-05-28 LAB — CBC WITH DIFFERENTIAL/PLATELET
Abs Immature Granulocytes: 0.01 10*3/uL (ref 0.00–0.07)
Basophils Absolute: 0 10*3/uL (ref 0.0–0.1)
Basophils Relative: 1 %
Eosinophils Absolute: 0.3 10*3/uL (ref 0.0–0.5)
Eosinophils Relative: 7 %
HCT: 41.2 % (ref 39.0–52.0)
Hemoglobin: 13.7 g/dL (ref 13.0–17.0)
Immature Granulocytes: 0 %
Lymphocytes Relative: 31 %
Lymphs Abs: 1.3 10*3/uL (ref 0.7–4.0)
MCH: 32.2 pg (ref 26.0–34.0)
MCHC: 33.3 g/dL (ref 30.0–36.0)
MCV: 96.7 fL (ref 80.0–100.0)
Monocytes Absolute: 0.5 10*3/uL (ref 0.1–1.0)
Monocytes Relative: 11 %
Neutro Abs: 2.1 10*3/uL (ref 1.7–7.7)
Neutrophils Relative %: 50 %
Platelets: 209 10*3/uL (ref 150–400)
RBC: 4.26 MIL/uL (ref 4.22–5.81)
RDW: 14.6 % (ref 11.5–15.5)
WBC: 4.2 10*3/uL (ref 4.0–10.5)
nRBC: 0 % (ref 0.0–0.2)

## 2023-05-28 LAB — COMPREHENSIVE METABOLIC PANEL
ALT: 27 U/L (ref 0–44)
AST: 24 U/L (ref 15–41)
Albumin: 4.1 g/dL (ref 3.5–5.0)
Alkaline Phosphatase: 64 U/L (ref 38–126)
Anion gap: 10 (ref 5–15)
BUN: 18 mg/dL (ref 8–23)
CO2: 24 mmol/L (ref 22–32)
Calcium: 9.2 mg/dL (ref 8.9–10.3)
Chloride: 103 mmol/L (ref 98–111)
Creatinine, Ser: 0.91 mg/dL (ref 0.61–1.24)
GFR, Estimated: >60 mL/min (ref >60)
Glucose, Bld: 97 mg/dL (ref 70–99)
Potassium: 4 mmol/L (ref 3.5–5.1)
Sodium: 137 mmol/L (ref 135–145)
Total Bilirubin: 0.7 mg/dL (ref <1.2)
Total Protein: 7.3 g/dL (ref 6.5–8.1)

## 2023-05-28 LAB — SEDIMENTATION RATE: Sed Rate: 7 mm/h (ref 0–16)

## 2023-05-28 MED ORDER — SODIUM CHLORIDE 0.9 % IV SOLN
10.0000 mg/kg | INTRAVENOUS | Status: DC
  Administered 2023-05-28: 1000 mg via INTRAVENOUS
  Filled 2023-05-28: qty 100

## 2023-05-28 MED ORDER — SODIUM CHLORIDE 0.9 % IV SOLN: INTRAVENOUS | Status: DC | PRN

## 2023-05-28 NOTE — Progress Notes (Signed)
PATIENT CARE CENTER NOTE     Diagnosis: Ulcerative pancolitis with complication [facility]) [K51.019]       Provider: Corliss Parish MD     Procedure: Remicade 10mg /kg infusion and lab work     Note: Patient received Remicade infusion (dose # 1 of 6) via PIV. No premeds required per orders.  Labs drawn (CBC w/diff, CMP, CRP, Sed Rate) prior to infusion and will be due again in 2 months. Infusion titrated per protocol. Patient tolerated well with no adverse reaction.  Vital signs stable. Patient declined AVS and already has next appointment scheduled.  Patient alert, oriented and ambulatory at discharge.

## 2023-05-29 LAB — HIGH SENSITIVITY CRP: CRP, High Sensitivity: 0.43 mg/L (ref 0.00–3.00)

## 2023-06-02 ENCOUNTER — Encounter (HOSPITAL_COMMUNITY): Payer: Medicare Other

## 2023-06-03 ENCOUNTER — Ambulatory Visit: Payer: Medicare Other | Admitting: Gastroenterology

## 2023-06-03 ENCOUNTER — Encounter: Payer: Self-pay | Admitting: Gastroenterology

## 2023-06-03 VITALS — BP 121/76 | HR 87 | Temp 97.8°F | Resp 24 | Ht 70.0 in | Wt 223.0 lb

## 2023-06-03 DIAGNOSIS — D125 Benign neoplasm of sigmoid colon: Secondary | ICD-10-CM

## 2023-06-03 DIAGNOSIS — K51019 Ulcerative (chronic) pancolitis with unspecified complications: Secondary | ICD-10-CM

## 2023-06-03 DIAGNOSIS — D123 Benign neoplasm of transverse colon: Secondary | ICD-10-CM | POA: Diagnosis not present

## 2023-06-03 MED ORDER — SODIUM CHLORIDE 0.9 % IV SOLN: 500.0000 mL | Freq: Once | INTRAVENOUS | Status: DC

## 2023-06-03 NOTE — Progress Notes (Signed)
Vss nad trans to pacu 

## 2023-06-03 NOTE — Progress Notes (Signed)
Pt's states no medical or surgical changes since previsit or office visit. VS assessed by K.P ?

## 2023-06-03 NOTE — Op Note (Signed)
[facility] Patient Name: Justin Rosales Procedure Date: 06/03/2023 10:42 AM MRN: [id number] Endoscopist: Corliss Parish , MD, [id number] Age: 72 Referring MD:  Date of Birth: March 05, 1951 Gender: Male Account #: [id number] Procedure:                Colonoscopy Indications:              Follow-up of chronic ulcerative pancolitis, Disease                            activity assessment of chronic ulcerative                            pancolitis, Assess therapeutic response to therapy                            of chronic ulcerative pancolitis, Elevated Fecal                            Calprotectin Medicines:                Monitored Anesthesia Care Procedure:                Pre-Anesthesia Assessment:                           - Prior to the procedure, a History and Physical                            was performed, and patient medications and                            allergies were reviewed. The patient's tolerance of                            previous anesthesia was also reviewed. The risks                            and benefits of the procedure and the sedation                            options and risks were discussed with the patient.                            All questions were answered, and informed consent                            was obtained. Prior Anticoagulants: The patient has                            taken no anticoagulant or antiplatelet agents. ASA                            Grade Assessment: II - A patient with mild systemic  disease. After reviewing the risks and benefits,                            the patient was deemed in satisfactory condition to                            undergo the procedure.                           After obtaining informed consent, the colonoscope                            was passed under direct vision. Throughout the                            procedure, the patient's blood pressure, pulse, and                             oxygen saturations were monitored continuously. The                            Olympus Scope SN [id number] was introduced through the                            anus and advanced to the the cecum, identified by                            appendiceal orifice and ileocecal valve. The                            colonoscopy was performed without difficulty. The                            patient tolerated the procedure. The quality of the                            bowel preparation was adequate. The terminal ileum,                            ileocecal valve, appendiceal orifice, and rectum                            were photographed. Scope In: 10:48:25 AM Scope Out: 11:28:38 AM Scope Withdrawal Time: 0 hours 37 minutes 24 seconds  Total Procedure Duration: 0 hours 40 minutes 13 seconds  Findings:                 The digital rectal exam findings include                            hemorrhoids. Pertinent negatives include no                            palpable rectal lesions.  The colon (entire examined portion) revealed                            significantly excessive looping.                           Normal mucosa was found in the transverse colon, at                            the hepatic flexure, in the ascending colon and in                            the cecum.                           A polypoid lesion was found at the splenic flexure                            near the previous tattoo. The lesion was                            semi-sessile. It is within a significant amount of                            previous scar. There is no bleeding present.                            Biopsies were taken with a cold forceps for                            histology to rule out DALM. Area distal to all of                            this was tattooed with an injection of Spot (carbon                            black) as well.                            A diffuse area of granular mucosa was found in the                            recto-sigmoid colon, in the sigmoid colon and in                            the descending colon. Biopsies were taken with a                            cold forceps for histology.                           Multiple semi-sessile polyps were found in the  recto-sigmoid colon, sigmoid colon and transverse                            colon. The polyps were 2 to 8 mm in size. These                            polyps were removed with a cold snare. Resection                            and retrieval were complete.                           Normal mucosa was found in the rectum. Biopsies                            were taken with a cold forceps for histology. Complications:            No immediate complications. Estimated Blood Loss:     Estimated blood loss was minimal. Impression:               - Hemorrhoids found on digital rectal exam.                           - There was significant looping of the colon.                           - Normal mucosa in the transverse colon, at the                            hepatic flexure, in the ascending colon and in the                            cecum.                           - Rule out malignancy, polypoid lesion at the                            splenic flexure. Biopsied. Tattooed.                           - Granularity in the recto-sigmoid colon, in the                            sigmoid colon and in the descending colon. Biopsied.                           - Multiple 2 to 8 mm polyps at the recto-sigmoid                            colon, in the sigmoid colon and in the transverse                            colon, removed with a cold snare.  Resected and                            retrieved.                           - Normal mucosa in the rectum. Biopsied. Recommendation:           - The patient will be observed post-procedure,                             until all discharge criteria are met.                           - Discharge patient to home.                           - Patient has a contact number available for                            emergencies. The signs and symptoms of potential                            delayed complications were discussed with the                            patient. Return to normal activities tomorrow.                            Written discharge instructions were provided to the                            patient.                           - Resume previous diet.                           - Continue present medications.                           - Await pathology results.                           - Repeat colonoscopy date to be determined after                            pending pathology results are reviewed for                            surveillance.                           - If dysplasia is found will have to discuss                            colectomy.                           -  The findings and recommendations were discussed                            with the patient.                           - The findings and recommendations were discussed                            with the patient's family. Corliss Parish, MD 06/03/2023 11:43:15 AM

## 2023-06-03 NOTE — Patient Instructions (Signed)
Discharge instructions given. Handouts on polyps and Hemorrhoids. Resume previous medications. Biopsies taken. See report recommendations. YOU HAD AN ENDOSCOPIC PROCEDURE TODAY AT THE [facility]:   Refer to the procedure report that was given to you for any specific questions about what was found during the examination.  If the procedure report does not answer your questions, please call your gastroenterologist to clarify.  If you requested that your care partner not be given the details of your procedure findings, then the procedure report has been included in a sealed envelope for you to review at your convenience later.  YOU SHOULD EXPECT: Some feelings of bloating in the abdomen. Passage of more gas than usual.  Walking can help get rid of the air that was put into your GI tract during the procedure and reduce the bloating. If you had a lower endoscopy (such as a colonoscopy or flexible sigmoidoscopy) you may notice spotting of blood in your stool or on the toilet paper. If you underwent a bowel prep for your procedure, you may not have a normal bowel movement for a few days.  Please Note:  You might notice some irritation and congestion in your nose or some drainage.  This is from the oxygen used during your procedure.  There is no need for concern and it should clear up in a day or so.  SYMPTOMS TO REPORT IMMEDIATELY:  Following lower endoscopy (colonoscopy or flexible sigmoidoscopy):  Excessive amounts of blood in the stool  Significant tenderness or worsening of abdominal pains  Swelling of the abdomen that is new, acute  Fever of 100F or higher   For urgent or emergent issues, a gastroenterologist can be reached at any hour by calling [phone]. Do not use MyChart messaging for urgent concerns.    DIET:  We do recommend a small meal at first, but then you may proceed to your regular diet.  Drink plenty of fluids but you should avoid alcoholic beverages for 24  hours.  ACTIVITY:  You should plan to take it easy for the rest of today and you should NOT DRIVE or use heavy machinery until tomorrow (because of the sedation medicines used during the test).    FOLLOW UP: Our staff will call the number listed on your records the next business day following your procedure.  We will call around 7:15- 8:00 am to check on you and address any questions or concerns that you may have regarding the information given to you following your procedure. If we do not reach you, we will leave a message.     If any biopsies were taken you will be contacted by phone or by letter within the next 1-3 weeks.  Please call us at [phone] if you have not heard about the biopsies in 3 weeks.    SIGNATURES/CONFIDENTIALITY: You and/or your care partner have signed paperwork which will be entered into your electronic medical record.  These signatures attest to the fact that that the information above on your After Visit Summary has been reviewed and is understood.  Full responsibility of the confidentiality of this discharge information lies with you and/or your care-partner.

## 2023-06-03 NOTE — Progress Notes (Signed)
GASTROENTEROLOGY PROCEDURE H&P NOTE   Primary Care Physician: Sharlene Dory, DO  HPI: Justin Rosales is a 72 y.o. male who presents for Colonoscopy for evaluation of underlying Ulcerative Colitis with elevated fecal calprotectin but improving inflammatory markers and no longer anemic.  Past Medical History:  Diagnosis Date   Allergy    Anemia    Arthritis    Blood transfusion without reported diagnosis    C. 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 Dr Loman Chroman possibly With [facility] x2. last one done around 2012    Current Outpatient Medications  Medication Sig Dispense Refill   amLODipine-valsartan (EXFORGE) 5-160 MG tablet TAKE 1 TABLET BY MOUTH DAILY 120 tablet 2   aspirin 81 MG EC tablet Take 1 tablet (81 mg total) by mouth daily. Swallow whole. 30 tablet 12   Bepotastine Besilate 1.5 % SOLN 1 drop 2 (two) times daily.     Cholecalciferol (DIALYVITE VITAMIN D 5000) 125 MCG (5000 UT) capsule Take 5,000 Units by mouth daily.     diphenhydrAMINE HCl, Sleep, (ZZZQUIL PO) Take 10 mLs by mouth daily as needed (sleep).     folic acid (FOLVITE) 1 MG tablet TAKE 2 TABLETS BY MOUTH DAILY 240 tablet 2   inFLIXimab in sodium chloride 0.9 % Inject into the vein. 01/16/2021 To start taking every 4 weeks.     mercaptopurine (PURINETHOL) 50 MG tablet TAKE 1 AND 1/2 TABLETS BY  MOUTH DAILY 180 tablet 3   Multiple Vitamin (MULTIVITAMIN WITH MINERALS) TABS tablet Take 1 tablet by mouth daily.      Na Sulfate-K Sulfate-Mg Sulf (SUPREP BOWEL PREP KIT) 17.5-3.13-1.6 GM/177ML SOLN Take 1 kit by mouth as directed. For colonoscopy prep (Patient not taking: Reported on 03/27/2023) 354 mL 0   NON FORMULARY Take 1 tablet by mouth daily. Vital reds-energy supplement     rosuvastatin (CRESTOR) 20 MG tablet TAKE 1 TABLET BY MOUTH DAILY 120 tablet 2   Saccharomyces boulardii (FLORASTOR PO) Take 1 capsule by mouth in the morning and at bedtime.     sildenafil (VIAGRA) 100 MG tablet Take 1 tablet (100 mg total) by mouth daily as needed for erectile dysfunction. 30 tablet 2   No current facility-administered medications for this visit.    Current Outpatient Medications:    amLODipine-valsartan (EXFORGE) 5-160 MG tablet, TAKE 1 TABLET BY MOUTH DAILY, Disp: 120 tablet, Rfl: 2   aspirin 81 MG EC tablet, Take 1 tablet (81 mg total) by mouth daily. Swallow whole., Disp: 30 tablet, Rfl: 12   Bepotastine Besilate 1.5 % SOLN, 1 drop 2 (two) times daily., Disp: , Rfl:    Cholecalciferol (DIALYVITE VITAMIN D 5000) 125 MCG (5000 UT) capsule, Take 5,000 Units by mouth daily., Disp: , Rfl:    diphenhydrAMINE HCl, Sleep, (ZZZQUIL PO), Take 10 mLs by mouth daily as needed (sleep)., Disp: , Rfl:    folic acid (FOLVITE) 1 MG tablet, TAKE 2 TABLETS BY MOUTH DAILY, Disp: 240 tablet, Rfl: 2  inFLIXimab in sodium chloride 0.9 %, Inject into the vein. 01/16/2021 To start taking every 4 weeks., Disp: , Rfl:    mercaptopurine (PURINETHOL) 50 MG tablet, TAKE 1 AND 1/2 TABLETS BY  MOUTH DAILY, Disp: 180 tablet, Rfl: 3   Multiple Vitamin (MULTIVITAMIN WITH MINERALS) TABS tablet, Take 1 tablet by mouth daily., Disp: , Rfl:    Na Sulfate-K Sulfate-Mg Sulf (SUPREP BOWEL PREP KIT) 17.5-3.13-1.6 GM/177ML SOLN, Take 1 kit by mouth as directed. For colonoscopy prep (Patient not taking: Reported on 03/27/2023), Disp: 354 mL, Rfl: 0   NON FORMULARY, Take 1 tablet by mouth daily. Vital reds-energy supplement, Disp: , Rfl:    rosuvastatin  (CRESTOR) 20 MG tablet, TAKE 1 TABLET BY MOUTH DAILY, Disp: 120 tablet, Rfl: 2   Saccharomyces boulardii (FLORASTOR PO), Take 1 capsule by mouth in the morning and at bedtime., Disp: , Rfl:    sildenafil (VIAGRA) 100 MG tablet, Take 1 tablet (100 mg total) by mouth daily as needed for erectile dysfunction., Disp: 30 tablet, Rfl: 2 No Known Allergies Family History  Problem Relation Age of Onset   Hypertension Mother    Diabetes Mother    Alzheimer's disease Mother    Arthritis Mother    Parkinson's disease Father    Alzheimer's disease Father    Colon cancer Neg Hx    Esophageal cancer Neg Hx    Rectal cancer Neg Hx    Stomach cancer Neg Hx    Social History   Socioeconomic History   Marital status: Married    Spouse name: [name]   Number of children: 2   Years of education: Not on file   Highest education level: Some college, no degree  Occupational History   Occupation: Estate agent   Occupation: retired  Tobacco Use   Smoking status: Former    Current packs/day: 0.00    Average packs/day: 1 pack/day for 30.0 years (30.0 ttl pk-yrs)    Types: Cigarettes    Start date: 48    Quit date: 2001    Years since quitting: 23.8   Smokeless tobacco: Never  Vaping Use   Vaping status: Never Used  Substance and Sexual Activity   Alcohol use: Yes    Alcohol/week: 1.0 standard drink of alcohol    Types: 1 Cans of beer per week    Comment: ocassionally   Drug use: Not Currently   Sexual activity: Yes    Birth control/protection: None  Other Topics Concern   Not on file  Social History Narrative   Not on file   Social Determinants of Health   Financial Resource Strain: Low Risk  (12/11/2022)   Overall Financial Resource Strain (CARDIA)    Difficulty of Paying Living Expenses: Not hard at all  Food Insecurity: No Food Insecurity (12/11/2022)   Hunger Vital Sign    Worried About Running Out of Food in the Last Year: Never true    Ran Out of Food in the Last Year: Never  true  Transportation Needs: No Transportation Needs (12/11/2022)   PRAPARE - Administrator, Civil Service (Medical): No    Lack of Transportation (Non-Medical): No  Physical Activity: Sufficiently Active (12/11/2022)   Exercise Vital Sign    Days of Exercise per Week: 4 days    Minutes of Exercise per Session: 50 min  Stress: No Stress Concern Present (12/11/2022)   [employer] of Occupational Health - Occupational Stress Questionnaire    Feeling of Stress : Not at all  Social Connections: Socially Integrated (12/11/2022)   Social Connection and Isolation Panel [NHANES]    Frequency of Communication with Friends and Family: More than three times a week    Frequency of Social Gatherings with Friends and Family: Twice a week    Attends Religious Services: More than 4 times per year    Active Member of [employer] or Organizations: Yes    Attends Engineer, structural: More than 4 times per year    Marital Status: Married  Catering manager Violence: Not At Risk (12/18/2022)   Humiliation, Afraid, Rape, and Kick questionnaire    Fear of Current or Ex-Partner: No    Emotionally Abused: No    Physically Abused: No    Sexually Abused: No    Physical Exam: There were no vitals filed for this visit. There is no height or weight on file to calculate BMI. GEN: NAD EYE: Sclerae anicteric ENT: MMM CV: Non-tachycardic GI: Soft, NT/ND NEURO:  Alert & Oriented x 3  Lab Results: No results for input(s): "WBC", "HGB", "HCT", "PLT" in the last 72 hours. BMET No results for input(s): "NA", "K", "CL", "CO2", "GLUCOSE", "BUN", "CREATININE", "CALCIUM" in the last 72 hours. LFT No results for input(s): "PROT", "ALBUMIN", "AST", "ALT", "ALKPHOS", "BILITOT", "BILIDIR", "IBILI" in the last 72 hours. PT/INR No results for input(s): "LABPROT", "INR" in the last 72 hours.   Impression / Plan: This is a 72 y.o.male who presents for Colonoscopy for evaluation of underlying Ulcerative  Colitis with elevated fecal calprotectin but improving inflammatory markers and no longer anemic.  The risks and benefits of endoscopic evaluation/treatment were discussed with the patient and/or family; these include but are not limited to the risk of perforation, infection, bleeding, missed lesions, lack of diagnosis, severe illness requiring hospitalization, as well as anesthesia and sedation related illnesses.  The patient's history has been reviewed, patient examined, no change in status, and deemed stable for procedure.  The patient and/or family is agreeable to proceed.    Corliss Parish, MD  Gastroenterology Advanced Endoscopy Office # [phone]

## 2023-06-03 NOTE — Progress Notes (Signed)
Called to room to assist during endoscopic procedure.  Patient ID and intended procedure confirmed with present staff. Received instructions for my participation in the procedure from the performing physician.  

## 2023-06-04 ENCOUNTER — Telehealth: Payer: Self-pay | Admitting: *Deleted

## 2023-06-04 NOTE — Telephone Encounter (Signed)
  Follow up Call-     06/03/2023   10:00 AM 06/27/2022    3:05 PM 05/22/2021    8:10 AM  Call back number  Post procedure Call Back phone  # [phone]  Permission to leave phone message Yes Yes Yes     Patient questions:  Do you have a fever, pain , or abdominal swelling? No. Pain Score  0 *  Have you tolerated food without any problems? Yes.    Have you been able to return to your normal activities? Yes.    Do you have any questions about your discharge instructions: Diet   N0 Medications  No Follow up visit  No.  Do you have questions or concerns about your Care? No.  Actions: * If pain score is 4 or above: No action needed, pain <4.

## 2023-06-05 ENCOUNTER — Encounter: Payer: Medicare Other | Admitting: Family Medicine

## 2023-06-05 LAB — SURGICAL PATHOLOGY

## 2023-06-08 ENCOUNTER — Encounter: Payer: Self-pay | Admitting: Gastroenterology

## 2023-06-08 ENCOUNTER — Other Ambulatory Visit: Payer: Self-pay

## 2023-06-08 DIAGNOSIS — K51019 Ulcerative (chronic) pancolitis with unspecified complications: Secondary | ICD-10-CM

## 2023-06-09 ENCOUNTER — Encounter: Payer: Self-pay | Admitting: Family Medicine

## 2023-06-09 ENCOUNTER — Ambulatory Visit: Payer: Medicare Other | Admitting: Family Medicine

## 2023-06-09 VITALS — BP 128/72 | HR 86 | Temp 98.0°F | Resp 16 | Ht 70.0 in | Wt 226.0 lb

## 2023-06-09 DIAGNOSIS — Z0000 Encounter for general adult medical examination without abnormal findings: Secondary | ICD-10-CM

## 2023-06-09 DIAGNOSIS — I10 Essential (primary) hypertension: Secondary | ICD-10-CM

## 2023-06-09 LAB — COMPREHENSIVE METABOLIC PANEL WITH GFR
ALT: 25 U/L (ref 0–53)
AST: 21 U/L (ref 0–37)
Albumin: 4.3 g/dL (ref 3.5–5.2)
Alkaline Phosphatase: 64 U/L (ref 39–117)
BUN: 14 mg/dL (ref 6–23)
CO2: 28 meq/L (ref 19–32)
Calcium: 9 mg/dL (ref 8.4–10.5)
Chloride: 102 meq/L (ref 96–112)
Creatinine, Ser: 0.78 mg/dL (ref 0.40–1.50)
GFR: 89.28 mL/min
Glucose, Bld: 96 mg/dL (ref 70–99)
Potassium: 4.1 meq/L (ref 3.5–5.1)
Sodium: 138 meq/L (ref 135–145)
Total Bilirubin: 0.5 mg/dL (ref 0.2–1.2)
Total Protein: 7.1 g/dL (ref 6.0–8.3)

## 2023-06-09 LAB — LIPID PANEL
Cholesterol: 123 mg/dL (ref 0–200)
HDL: 50.1 mg/dL (ref >39.00)
LDL Cholesterol: 47 mg/dL (ref 0–99)
NonHDL: 73.21
Total CHOL/HDL Ratio: 2
Triglycerides: 130 mg/dL (ref 0.0–149.0)
VLDL: 26 mg/dL (ref 0.0–40.0)

## 2023-06-09 LAB — CBC
HCT: 40.9 % (ref 39.0–52.0)
Hemoglobin: 13.7 g/dL (ref 13.0–17.0)
MCHC: 33.4 g/dL (ref 30.0–36.0)
MCV: 95.9 fL (ref 78.0–100.0)
Platelets: 260 10*3/uL (ref 150.0–400.0)
RBC: 4.26 Mil/uL (ref 4.22–5.81)
RDW: 15.4 % (ref 11.5–15.5)
WBC: 4.6 10*3/uL (ref 4.0–10.5)

## 2023-06-09 NOTE — Patient Instructions (Addendum)
Give [place] 2-3 business days to get the results of your labs back.   Keep the diet clean and stay active.  The Shingrix vaccine (for shingles) is a 2 shot series spaced 2-6 months apart. It can make people feel low energy, achy and almost like they have the flu for 48 hours after injection. 1/5 people can have nausea and/or vomiting. Please plan accordingly when deciding on when to get this shot. Call your pharmacy to get this. The second shot of the series is less severe regarding the side effects, but it still lasts 48 hours.   Please get me a copy of your advanced directive form at your convenience.   Let us know if you need anything.

## 2023-06-09 NOTE — Progress Notes (Signed)
Chief Complaint  Patient presents with   Annual Exam    Annual Exam    Well Male Justin Rosales is here for a complete physical.   His last physical was >1 year ago.  Current diet: in general, a "healthy" diet.   Current exercise: walking, golfing Weight trend: stable Fatigue out of ordinary? No. Seat belt? Yes.   Advanced directive? Yes  Health maintenance Shingrix- No Colonoscopy- Yes Tetanus- Yes Hep C- Yes Lung cancer screening- Yes Pneumonia vaccine- Yes  Past Medical History:  Diagnosis Date   Allergy    Anemia    Arthritis    Blood transfusion without reported diagnosis    C. 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 Dr Loman Chroman possibly With [facility] x2. last one done around 2012     Medications  Current Outpatient Medications on File Prior to Visit  Medication Sig Dispense Refill   amLODipine-valsartan (EXFORGE) 5-160 MG tablet TAKE 1 TABLET BY MOUTH DAILY 120 tablet 2   aspirin 81 MG EC tablet Take 1 tablet (81 mg total) by mouth daily. Swallow whole. 30 tablet 12   Bepotastine Besilate 1.5 % SOLN 1 drop 2 (two) times daily.     Cholecalciferol (DIALYVITE VITAMIN D 5000) 125 MCG (5000 UT) capsule Take 5,000 Units by mouth daily.     diphenhydrAMINE HCl, Sleep, (ZZZQUIL PO) Take 10 mLs by mouth daily as needed (sleep).     folic acid (FOLVITE) 1 MG tablet TAKE 2 TABLETS BY MOUTH DAILY 240 tablet 2   inFLIXimab in sodium chloride 0.9 % Inject into the vein. 01/16/2021 To  start taking every 4 weeks.     mercaptopurine (PURINETHOL) 50 MG tablet TAKE 1 AND 1/2 TABLETS BY  MOUTH DAILY 180 tablet 3   Multiple Vitamin (MULTIVITAMIN WITH MINERALS) TABS tablet Take 1 tablet by mouth daily.     Na Sulfate-K Sulfate-Mg Sulf (SUPREP BOWEL PREP KIT) 17.5-3.13-1.6 GM/177ML SOLN Take 1 kit by mouth as directed. For colonoscopy prep 354 mL 0   NON FORMULARY Take 1 tablet by mouth daily. Vital reds-energy supplement     rosuvastatin (CRESTOR) 20 MG tablet TAKE 1 TABLET BY MOUTH DAILY 120 tablet 2   Saccharomyces boulardii (FLORASTOR PO) Take 1 capsule by mouth in the morning and at bedtime.     sildenafil (VIAGRA) 100 MG tablet Take 1 tablet (100 mg total) by mouth daily as needed for erectile dysfunction. 30 tablet 2    Allergies No Known Allergies  Family History Family History  Problem Relation Age of Onset   Hypertension Mother    Diabetes Mother    Alzheimer's disease Mother    Arthritis Mother    Parkinson's disease Father    Alzheimer's disease Father    Colon cancer Neg Hx    Esophageal cancer Neg Hx    Rectal cancer Neg Hx    Stomach cancer Neg Hx     Review of Systems: Constitutional:  no fevers Eye:  no recent significant change in vision Ears:  No changes in hearing Nose/Mouth/Throat:  no complaints of nasal congestion, no sore throat Cardiovascular: no chest pain Respiratory:  No shortness of breath Gastrointestinal:  No change in bowel habits GU:  No frequency Integumentary:  no abnormal skin lesions reported Neurologic:  no headaches Endocrine:  denies unexplained weight changes  Exam BP 128/72 (BP Location: Left Arm, Patient Position: Sitting, Cuff Size: Normal)   Pulse 86   Temp 98 F (36.7 C) (Oral)   Resp 16   Ht 5\' 10"  (1.778 m)   Wt 226 lb (102.5 kg)   SpO2 95%   BMI 32.43 kg/m  General:  well developed, well nourished, in no apparent distress Skin:  no significant moles, warts, or growths Head:  no masses, lesions, or  tenderness Eyes:  pupils equal and round, sclera anicteric without injection Ears:  canals without lesions, TMs shiny without retraction, no obvious effusion, no erythema Nose:  nares patent, mucosa normal Throat/Pharynx:  lips and gingiva without lesion; tongue and uvula midline; non-inflamed pharynx; no exudates or postnasal drainage Lungs:  clear to auscultation, breath sounds equal bilaterally, no respiratory distress Cardio:  regular rate and rhythm, no LE edema or bruits Rectal: Deferred GI: BS+, S, NT, ND, no masses or organomegaly Musculoskeletal:  symmetrical muscle groups noted without atrophy or deformity Neuro:  gait normal; deep tendon reflexes normal and symmetric Psych: well oriented with normal range of affect and appropriate judgment/insight  Assessment and Plan  Well adult exam  Essential hypertension - Plan: CBC, Comprehensive metabolic panel, Lipid panel   Well 72 y.o. male. Counseled on diet and exercise. Shingrix rec'd to get at pharmacy.  Advanced directive form requested today.  Other orders as above. Follow up in 6 mo.  The patient voiced understanding and agreement to the plan.  Jilda Roche [place], DO 06/09/23 10:23 AM

## 2023-06-25 ENCOUNTER — Non-Acute Institutional Stay (HOSPITAL_COMMUNITY)
Admission: RE | Admit: 2023-06-25 | Discharge: 2023-06-25 | Disposition: A | Discharge status: Home / Self Care | Payer: Medicare Other | Source: Ambulatory Visit | Attending: Internal Medicine | Admitting: Internal Medicine

## 2023-06-25 ENCOUNTER — Other Ambulatory Visit: Payer: Medicare Other

## 2023-06-25 DIAGNOSIS — K51019 Ulcerative (chronic) pancolitis with unspecified complications: Secondary | ICD-10-CM | POA: Insufficient documentation

## 2023-06-25 MED ORDER — SODIUM CHLORIDE 0.9 % IV SOLN: INTRAVENOUS | Status: DC | PRN

## 2023-06-25 MED ORDER — SODIUM CHLORIDE 0.9 % IV SOLN
1000.0000 mg | INTRAVENOUS | Status: DC
  Administered 2023-06-25: 1000 mg via INTRAVENOUS
  Filled 2023-06-25: qty 100

## 2023-06-25 NOTE — Progress Notes (Signed)
PATIENT CARE CENTER NOTE  Diagnosis: Ulcerative pancolitis with complication [facility]) [K51.019]     Provider: Corliss Parish MD   Procedure: Remicade 10mg /kg infusion   Note: Patient received Remicade infusion (dose #2 of 6) via PIV. No pre-meds required per orders. Labs due every 2 months and were drawn last month. Infusion titrated per protocol. Pt tolerated well with no adverse reaction. Vital signs are stable. Pt declined AVS and has already scheduled next appointment for July 23, 2023. Pt is alert, oriented, and ambulatory at discharge.

## 2023-07-02 ENCOUNTER — Other Ambulatory Visit: Payer: Self-pay

## 2023-07-02 DIAGNOSIS — K51019 Ulcerative (chronic) pancolitis with unspecified complications: Secondary | ICD-10-CM

## 2023-07-02 LAB — THIOPURINE METABOLITES
6 MMP(6-Methylmercaptopurine): <500 pmol/8x10(8)RBC (ref <5700)
6 TG(6-Thioguanine): 257 pmol/8x10(8)RBC (ref 235–400)

## 2023-07-02 MED ORDER — MERCAPTOPURINE 50 MG PO TABS: 100.0000 mg | ORAL_TABLET | Freq: Every day | ORAL | 3 refills | Status: DC

## 2023-07-09 LAB — INFLIXIMAB+AB (SERIAL MONITOR): Infliximab Drug Level: 34 ug/mL

## 2023-07-22 ENCOUNTER — Encounter: Payer: Self-pay | Admitting: Gastroenterology

## 2023-07-23 ENCOUNTER — Non-Acute Institutional Stay (HOSPITAL_COMMUNITY)
Admission: RE | Admit: 2023-07-23 | Discharge: 2023-07-23 | Disposition: A | Discharge status: Home / Self Care | Payer: Medicare Other | Source: Ambulatory Visit | Attending: Internal Medicine | Admitting: Internal Medicine

## 2023-07-23 VITALS — BP 148/68 | HR 71 | Temp 97.2°F | Resp 16 | Wt 232.0 lb

## 2023-07-23 DIAGNOSIS — K51919 Ulcerative colitis, unspecified with unspecified complications: Secondary | ICD-10-CM

## 2023-07-23 DIAGNOSIS — K51019 Ulcerative (chronic) pancolitis with unspecified complications: Secondary | ICD-10-CM | POA: Diagnosis present

## 2023-07-23 LAB — COMPREHENSIVE METABOLIC PANEL
ALT: 36 U/L (ref 0–44)
AST: 23 U/L (ref 15–41)
Albumin: 4 g/dL (ref 3.5–5.0)
Alkaline Phosphatase: 57 U/L (ref 38–126)
Anion gap: 7 (ref 5–15)
BUN: 17 mg/dL (ref 8–23)
CO2: 23 mmol/L (ref 22–32)
Calcium: 9.3 mg/dL (ref 8.9–10.3)
Chloride: 106 mmol/L (ref 98–111)
Creatinine, Ser: 0.69 mg/dL (ref 0.61–1.24)
GFR, Estimated: >60 mL/min (ref >60)
Glucose, Bld: 97 mg/dL (ref 70–99)
Potassium: 4 mmol/L (ref 3.5–5.1)
Sodium: 136 mmol/L (ref 135–145)
Total Bilirubin: 0.6 mg/dL (ref 0.0–1.2)
Total Protein: 7.4 g/dL (ref 6.5–8.1)

## 2023-07-23 LAB — CBC WITH DIFFERENTIAL/PLATELET
Abs Immature Granulocytes: 0.01 10*3/uL (ref 0.00–0.07)
Basophils Absolute: 0 10*3/uL (ref 0.0–0.1)
Basophils Relative: 1 %
Eosinophils Absolute: 0.3 10*3/uL (ref 0.0–0.5)
Eosinophils Relative: 8 %
HCT: 39.4 % (ref 39.0–52.0)
Hemoglobin: 12.8 g/dL — ABNORMAL LOW (ref 13.0–17.0)
Immature Granulocytes: 0 %
Lymphocytes Relative: 34 %
Lymphs Abs: 1.3 10*3/uL (ref 0.7–4.0)
MCH: 31.8 pg (ref 26.0–34.0)
MCHC: 32.5 g/dL (ref 30.0–36.0)
MCV: 97.8 fL (ref 80.0–100.0)
Monocytes Absolute: 0.4 10*3/uL (ref 0.1–1.0)
Monocytes Relative: 9 %
Neutro Abs: 1.9 10*3/uL (ref 1.7–7.7)
Neutrophils Relative %: 48 %
Platelets: 226 10*3/uL (ref 150–400)
RBC: 4.03 MIL/uL — ABNORMAL LOW (ref 4.22–5.81)
RDW: 14.8 % (ref 11.5–15.5)
WBC: 3.9 10*3/uL — ABNORMAL LOW (ref 4.0–10.5)
nRBC: 0 % (ref 0.0–0.2)

## 2023-07-23 LAB — C-REACTIVE PROTEIN: CRP: <0.5 mg/dL (ref <1.0)

## 2023-07-23 LAB — SEDIMENTATION RATE: Sed Rate: 20 mm/h — ABNORMAL HIGH (ref 0–16)

## 2023-07-23 MED ORDER — SODIUM CHLORIDE 0.9 % IV SOLN
10.0000 mg/kg | INTRAVENOUS | Status: DC
  Administered 2023-07-23: 1100 mg via INTRAVENOUS
  Filled 2023-07-23: qty 110

## 2023-07-23 MED ORDER — SODIUM CHLORIDE 0.9 % IV SOLN: INTRAVENOUS | Status: DC | PRN

## 2023-07-23 NOTE — Progress Notes (Signed)
 PATIENT CARE CENTER NOTE     Diagnosis: Ulcerative pancolitis with complication [facility]) [K51.019]       Provider: Aloha Finner MD     Procedure: Remicade  10mg /kg infusion and lab work     Note: Patient received Remicade  infusion (dose # 3 of 6) via PIV. No premeds required per orders.  Labs drawn (CBC w/diff, CMP, CRP, Sed Rate) prior to infusion and will be due again in 2 months. Infusion titrated per protocol. Patient tolerated well with no adverse reaction.  Vital signs stable. Patient declined AVS and will schedule next appointment at the front desk. Patient alert, oriented and ambulatory at discharge.

## 2023-07-24 NOTE — Telephone Encounter (Signed)
 Please let Mr. Detienne, know that if he is doing well, then I think that would be reasonable. Thanks. GM

## 2023-07-26 IMAGING — US US ABDOMINAL AORTA SCREENING AAA
1 series · 14 of 20 positions shown · non-contrast
Comparison: None.

CLINICAL DATA: Male between 65-75 years of age with a smoking
history.

EXAM:
US ABDOMINAL AORTA MEDICARE SCREENING
TECHNIQUE: Ultrasound examination of the abdominal aorta was performed as a
screening evaluation for abdominal aortic aneurysm.

[Series 1: us abdominal aorta screening aaa · 14 of 20 slices shown]
[im 1/20]
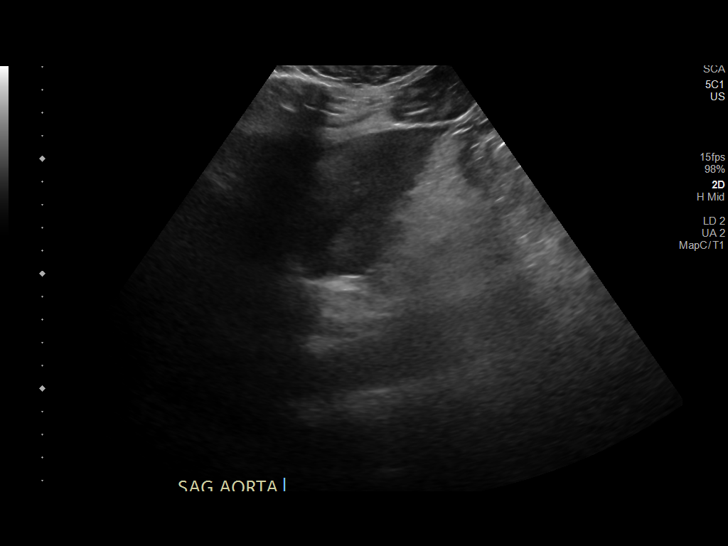
[im 3/20]
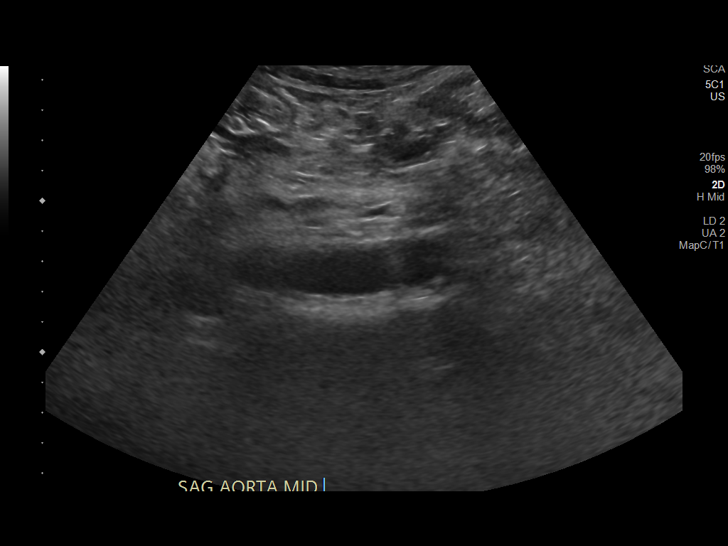
[im 4/20]
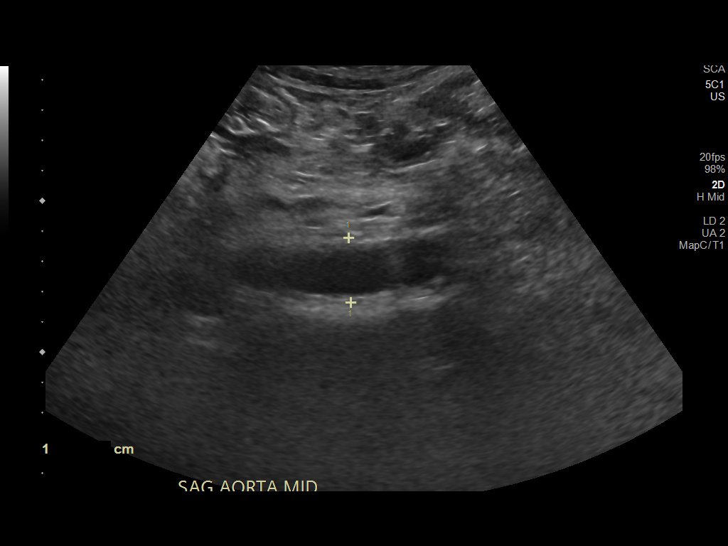
[im 6/20]
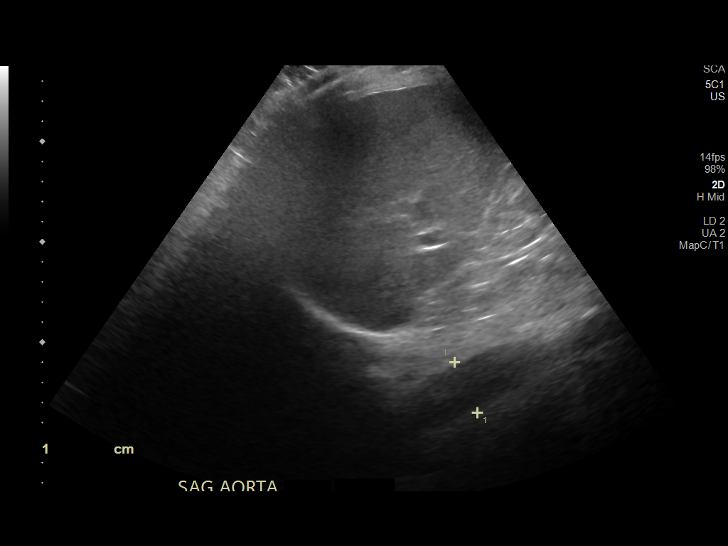
[im 7/20]
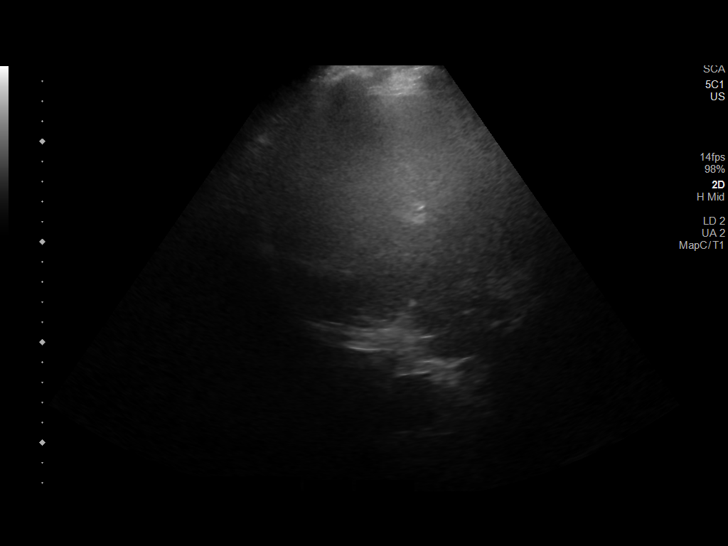
[im 8/20]
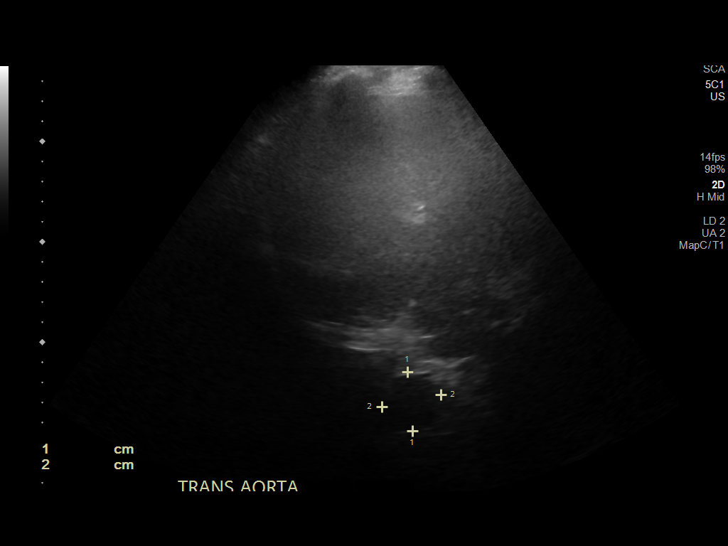
[im 10/20]
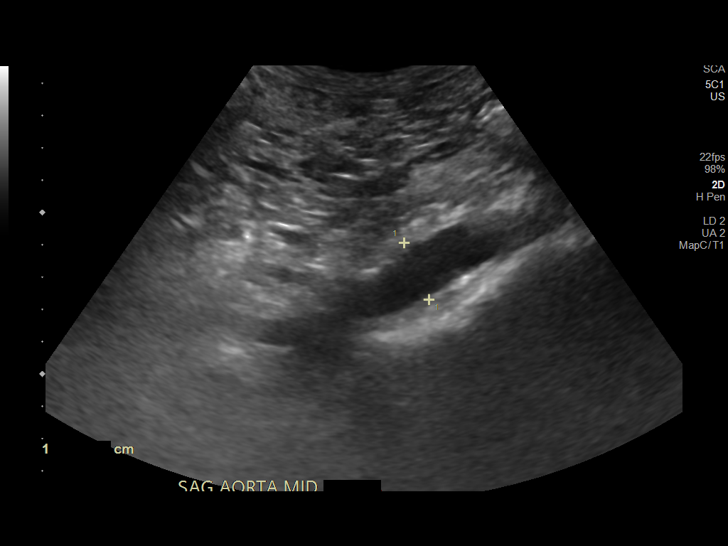
[im 11/20]
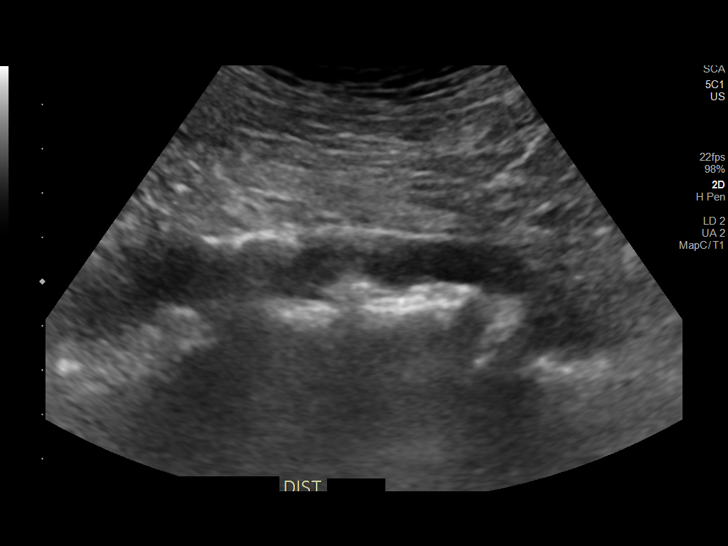
[im 13/20]
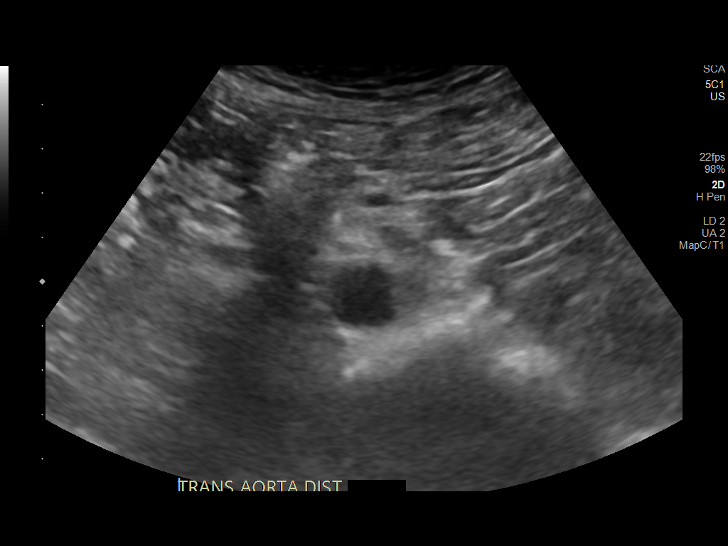
[im 14/20]
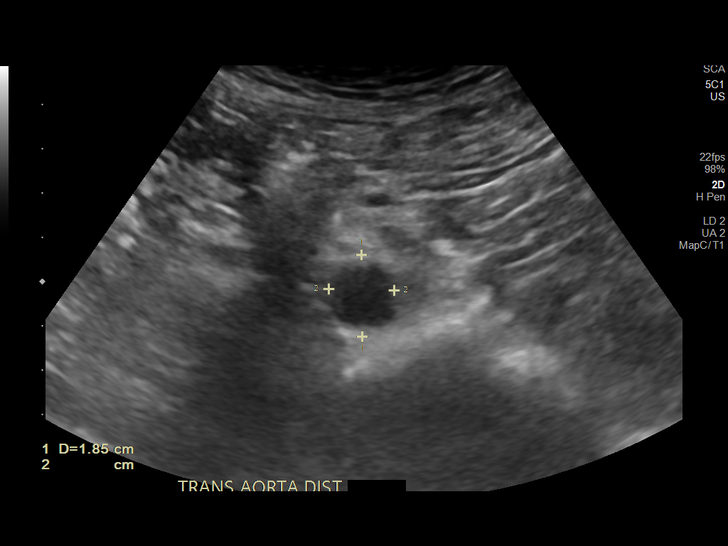
[im 16/20]
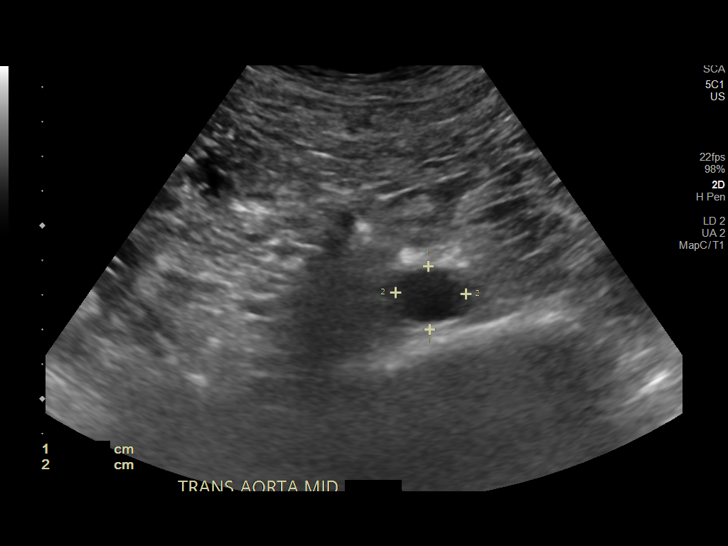
[im 17/20]
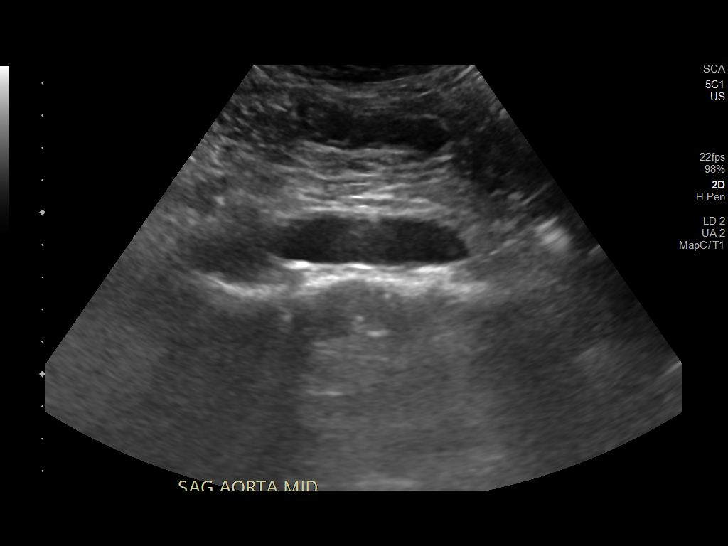
[im 18/20]
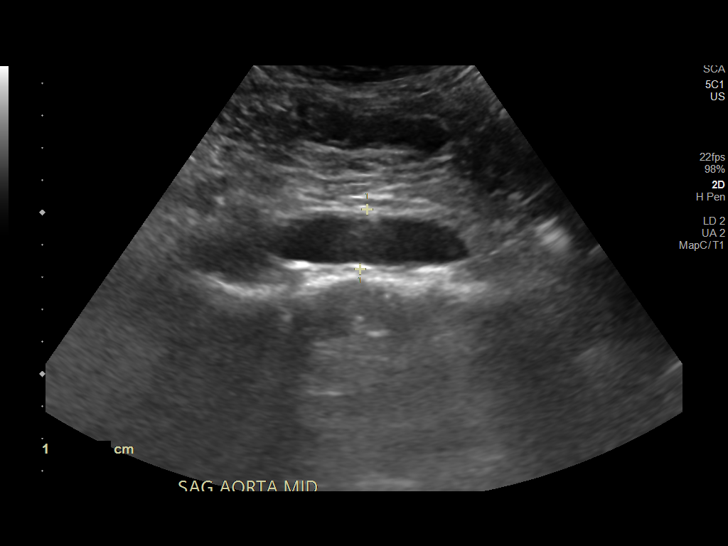
[im 20/20]
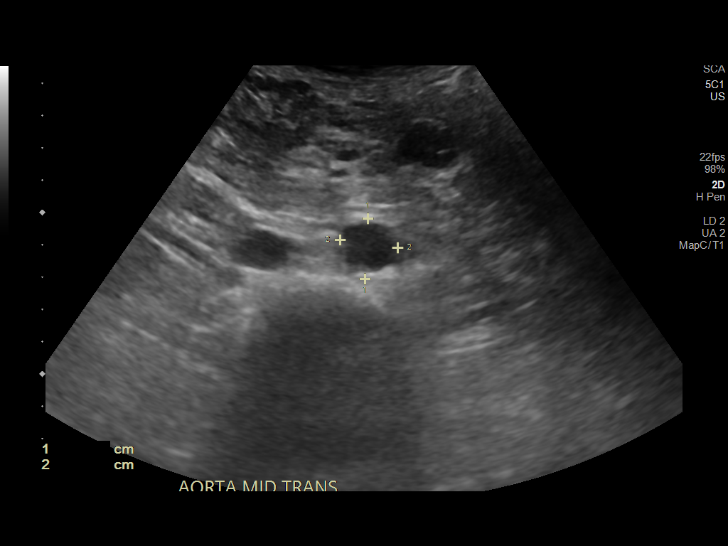

[14 of 20 positions shown; findings below may reference images not displayed]

FINDINGS: Abdominal aortic measurements as follows:

Proximal: 2.8 cm. One transverse measurement of 3 cm is not felt to
be very accurate as the aorta is poorly visualized due to poor
sonographic penetration through the entire liver.

Mid:  1.8 x 2.0 cm

Distal:  1.5 x 1.9 cm

Significant atherosclerosis is seen throughout the abdominal aorta,
especially in the distal abdominal aorta.
IMPRESSION: Significant aortic atherosclerosis without evidence of aneurysmal
disease. Atherosclerosis is especially prominent in the distal
abdominal aorta and there may be risk of developing significant
aortoiliac occlusive disease. Correlation suggested with any
claudication symptoms.

## 2023-08-08 ENCOUNTER — Other Ambulatory Visit: Payer: Self-pay | Admitting: Hematology & Oncology

## 2023-08-11 ENCOUNTER — Encounter: Payer: Self-pay | Admitting: Gastroenterology

## 2023-08-12 ENCOUNTER — Ambulatory Visit: Payer: Medicare Other | Admitting: Family Medicine

## 2023-08-12 VITALS — BP 128/76 | HR 95 | Temp 100.4°F | Resp 16 | Ht 70.0 in | Wt 227.2 lb

## 2023-08-12 DIAGNOSIS — J40 Bronchitis, not specified as acute or chronic: Secondary | ICD-10-CM

## 2023-08-12 MED ORDER — DOXYCYCLINE HYCLATE 100 MG PO TABS: 100.0000 mg | ORAL_TABLET | Freq: Two times a day (BID) | ORAL | 0 refills | Status: AC

## 2023-08-12 MED ORDER — PROMETHAZINE-DM 6.25-15 MG/5ML PO SYRP: 5.0000 mL | ORAL_SOLUTION | Freq: Four times a day (QID) | ORAL | 0 refills | Status: DC | PRN

## 2023-08-12 MED ORDER — PREDNISONE 20 MG PO TABS: 40.0000 mg | ORAL_TABLET | Freq: Every day | ORAL | 0 refills | Status: AC

## 2023-08-12 NOTE — Patient Instructions (Addendum)
Continue to push fluids, practice good hand hygiene, and cover your mouth if you cough.  If you start having fevers, shaking or shortness of breath, seek immediate care.  OK to take Tylenol 1000 mg (2 extra strength tabs) or 975 mg (3 regular strength tabs) every 6 hours as needed.  OK to wait 1-2 days before taking the doxycycline and only if no better.   Do not drink alcohol, do any illicit/street drugs, drive or do anything that requires alertness while on this cough medicine.   Let us know if you need anything.

## 2023-08-12 NOTE — Progress Notes (Signed)
Chief Complaint  Patient presents with   Cough    Cough and congestion   Justin Rosales here for URI complaints.  Duration: 4 days  Associated symptoms: Fever (102 F), sinus congestion, rhinorrhea, sore throat, wheezing, myalgia, and productive cough Denies: sinus pain, itchy watery eyes, ear pain, ear drainage, and shortness of breath Treatment to date: Tylenol, Mucinex DM, Dayquil/Nyquil Sick contacts: No; flew to [place] recently Tested neg for covid at home.   Past Medical History:  Diagnosis Date   Allergy    Anemia    Arthritis    Blood transfusion without reported diagnosis    C. 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   Lower leg DVT (deep venous thromboembolism), acute, right (HCC) 09/28/2020   UC (ulcerative colitis) (HCC)     Objective BP 128/76   Pulse 95   Temp (!) 100.4 F (38 C) (Oral)   Resp 16   Ht 5\' 10"  (1.778 m)   Wt 227 lb 3.2 oz (103.1 kg)   SpO2 96%   BMI 32.60 kg/m  General: Awake, alert, appears stated age HEENT: AT, [place], ears patent b/l and TM's neg, nares patent w/o discharge, pharynx pink and without exudates, MMM, no sinus ttp Neck: No masses or asymmetry Heart: RRR Lungs: Diffuse expiratory wheezing noted, no accessory muscle use Psych: Age appropriate judgment and insight, normal mood and affect  Wheezy bronchitis - Plan: doxycycline (VIBRA-TABS) 100 MG tablet, predniSONE (DELTASONE) 20 MG tablet, promethazine-dextromethorphan (PROMETHAZINE-DM) 6.25-15 MG/5ML syrup  Acute illness w systemic symptoms (fever). Flu testing, covid and strep test neg. 5-day prednisone burst 40 mg daily.  He is in partial remission with his UC and would prefer to avoid antibiotic therapy.  If no  improvement the next couple days, he will take the antibiotic.  Cough syrup as above.  Warnings about drowsiness verbalized. Continue to push fluids, practice good hand hygiene, cover mouth when coughing. F/u prn. If starting to experience worsening symptoms, shaking, or shortness of breath, seek immediate care. Pt voiced understanding and agreement to the plan.  Jilda Roche [place], DO 08/12/23 10:56 AM

## 2023-08-13 ENCOUNTER — Encounter: Payer: Self-pay | Admitting: Gastroenterology

## 2023-08-14 ENCOUNTER — Encounter: Payer: Self-pay | Admitting: Hematology & Oncology

## 2023-08-14 ENCOUNTER — Inpatient Hospital Stay: Payer: Medicare Other | Admitting: Hematology & Oncology

## 2023-08-14 ENCOUNTER — Inpatient Hospital Stay: Payer: Medicare Other | Attending: Hematology & Oncology

## 2023-08-14 VITALS — BP 162/72 | HR 80 | Temp 98.3°F | Resp 17 | Ht 70.0 in | Wt 226.0 lb

## 2023-08-14 DIAGNOSIS — N189 Chronic kidney disease, unspecified: Secondary | ICD-10-CM | POA: Insufficient documentation

## 2023-08-14 DIAGNOSIS — K909 Intestinal malabsorption, unspecified: Secondary | ICD-10-CM

## 2023-08-14 DIAGNOSIS — D500 Iron deficiency anemia secondary to blood loss (chronic): Secondary | ICD-10-CM | POA: Diagnosis present

## 2023-08-14 DIAGNOSIS — D631 Anemia in chronic kidney disease: Secondary | ICD-10-CM

## 2023-08-14 DIAGNOSIS — K51911 Ulcerative colitis, unspecified with rectal bleeding: Secondary | ICD-10-CM | POA: Diagnosis not present

## 2023-08-14 DIAGNOSIS — I82431 Acute embolism and thrombosis of right popliteal vein: Secondary | ICD-10-CM | POA: Insufficient documentation

## 2023-08-14 DIAGNOSIS — K51219 Ulcerative (chronic) proctitis with unspecified complications: Secondary | ICD-10-CM | POA: Diagnosis not present

## 2023-08-14 LAB — CMP (CANCER CENTER ONLY)
ALT: 58 U/L — ABNORMAL HIGH (ref 0–44)
AST: 36 U/L (ref 15–41)
Albumin: 3.9 g/dL (ref 3.5–5.0)
Alkaline Phosphatase: 52 U/L (ref 38–126)
Anion gap: 10 (ref 5–15)
BUN: 23 mg/dL (ref 8–23)
CO2: 25 mmol/L (ref 22–32)
Calcium: 8.7 mg/dL — ABNORMAL LOW (ref 8.9–10.3)
Chloride: 103 mmol/L (ref 98–111)
Creatinine: 0.86 mg/dL (ref 0.61–1.24)
GFR, Estimated: >60 mL/min (ref >60)
Glucose, Bld: 103 mg/dL — ABNORMAL HIGH (ref 70–99)
Potassium: 3.5 mmol/L (ref 3.5–5.1)
Sodium: 138 mmol/L (ref 135–145)
Total Bilirubin: 0.5 mg/dL (ref 0.0–1.2)
Total Protein: 6.7 g/dL (ref 6.5–8.1)

## 2023-08-14 LAB — CBC WITH DIFFERENTIAL (CANCER CENTER ONLY)
Abs Immature Granulocytes: 0.07 10*3/uL (ref 0.00–0.07)
Basophils Absolute: 0 10*3/uL (ref 0.0–0.1)
Basophils Relative: 0 %
Eosinophils Absolute: 0 10*3/uL (ref 0.0–0.5)
Eosinophils Relative: 0 %
HCT: 37.4 % — ABNORMAL LOW (ref 39.0–52.0)
Hemoglobin: 12.8 g/dL — ABNORMAL LOW (ref 13.0–17.0)
Immature Granulocytes: 1 %
Lymphocytes Relative: 20 %
Lymphs Abs: 1.3 10*3/uL (ref 0.7–4.0)
MCH: 31.6 pg (ref 26.0–34.0)
MCHC: 34.2 g/dL (ref 30.0–36.0)
MCV: 92.3 fL (ref 80.0–100.0)
Monocytes Absolute: 0.6 10*3/uL (ref 0.1–1.0)
Monocytes Relative: 9 %
Neutro Abs: 4.6 10*3/uL (ref 1.7–7.7)
Neutrophils Relative %: 70 %
Platelet Count: 213 10*3/uL (ref 150–400)
RBC: 4.05 MIL/uL — ABNORMAL LOW (ref 4.22–5.81)
RDW: 14.5 % (ref 11.5–15.5)
WBC Count: 6.6 10*3/uL (ref 4.0–10.5)
nRBC: 0 % (ref 0.0–0.2)

## 2023-08-14 LAB — IRON AND IRON BINDING CAPACITY (CC-WL,HP ONLY)
Iron: 71 ug/dL (ref 45–182)
Saturation Ratios: 27 % (ref 17.9–39.5)
TIBC: 265 ug/dL (ref 250–450)
UIBC: 194 ug/dL (ref 117–376)

## 2023-08-14 LAB — FERRITIN: Ferritin: 354 ng/mL — ABNORMAL HIGH (ref 24–336)

## 2023-08-14 LAB — RETICULOCYTES
Immature Retic Fract: 10.6 % (ref 2.3–15.9)
RBC.: 4.01 MIL/uL — ABNORMAL LOW (ref 4.22–5.81)
Retic Count, Absolute: 33.7 10*3/uL (ref 19.0–186.0)
Retic Ct Pct: 0.8 % (ref 0.4–3.1)

## 2023-08-14 NOTE — Progress Notes (Signed)
Hematology and Oncology Follow Up Visit  Justin Rosales [id number] March 06, 1951 73 y.o. 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 Justin Rosales is back for follow-up.  Unfortunately, he got sick after coming back on a trip to [place].  I think he was down there over the Holidays.  He did have a nice time.  A lot of family members also got sick.  He currently is on some doxycycline.  He has had no obvious bleeding.  There is been no flareups of the ulcerative colitis.  When we last saw him back in September, his ferritin was 154 with an iron saturation of 33%.  He had a colonoscopy in November.  He tolerated this well.  Multiple biopsies were taken.  There is no evidence of malignancy on the biopsies that were taken.  He has had no leg swelling.  He has had no rashes.  He has had no headache.  Thankfully, he has had no issues with COVID.  Overall, I would say that his performance status is probably ECOG 1.      Current Outpatient Medications:    amLODipine-valsartan (EXFORGE) 5-160 MG tablet, TAKE 1 TABLET BY MOUTH DAILY, Disp: 120 tablet, Rfl: 2   aspirin 81 MG EC tablet, Take 1 tablet (81 mg total) by mouth daily. Swallow whole., Disp: 30 tablet, Rfl: 12   Bepotastine Besilate 1.5 % SOLN, 1 drop 2 (two) times daily., Disp: , Rfl:    Cholecalciferol (DIALYVITE VITAMIN D 5000) 125 MCG (5000 UT) capsule, Take 5,000 Units by mouth daily., Disp: , Rfl:    diphenhydrAMINE HCl, Sleep, (ZZZQUIL PO), Take 10 mLs by mouth daily as needed (sleep)., Disp: , Rfl:    doxycycline (VIBRA-TABS) 100 MG tablet, Take 1 tablet (100 mg total) by mouth 2 (two) times daily for 7 days., Disp: 14 tablet,  Rfl: 0   folic acid (FOLVITE) 1 MG tablet, TAKE 2 TABLETS BY MOUTH DAILY, Disp: 240 tablet, Rfl: 2   inFLIXimab in sodium chloride 0.9 %, Inject into the vein. 01/16/2021 To start taking every 4 weeks., Disp: , Rfl:    mercaptopurine (PURINETHOL) 50 MG tablet, Take 2 tablets (100 mg total) by mouth daily., Disp: 180 tablet, Rfl: 3   Multiple Vitamin (MULTIVITAMIN WITH MINERALS) TABS tablet, Take 1 tablet by mouth daily., Disp: , Rfl:    Na Sulfate-K Sulfate-Mg Sulf (SUPREP BOWEL PREP KIT) 17.5-3.13-1.6 GM/177ML SOLN, Take 1 kit by mouth as directed. For colonoscopy prep, Disp: 354 mL, Rfl: 0   NON FORMULARY, Take 1 tablet by mouth daily. Vital reds-energy supplement, Disp: , Rfl:    predniSONE (DELTASONE) 20 MG tablet, Take 2 tablets (40 mg total) by mouth daily with breakfast for 5 days., Disp: 10 tablet, Rfl: 0   promethazine-dextromethorphan (PROMETHAZINE-DM) 6.25-15 MG/5ML syrup, Take 5 mLs by mouth 4 (four) times daily as needed for cough., Disp: 118 mL, Rfl: 0   rosuvastatin (CRESTOR) 20 MG tablet, TAKE 1 TABLET BY MOUTH DAILY, Disp: 120 tablet, Rfl: 2   Saccharomyces boulardii (FLORASTOR PO), Take 1 capsule by mouth in the morning and at bedtime., Disp: , Rfl:    sildenafil (VIAGRA) 100 MG tablet, Take 1 tablet (100 mg total) by  mouth daily as needed for erectile dysfunction., Disp: 30 tablet, Rfl: 2  Allergies: No Known Allergies  Past Medical History, Surgical history, Social history, and Family History were reviewed and updated.  Review of Systems: Review of Systems  Constitutional:  Positive for fatigue.  HENT:  Negative.    Eyes: Negative.   Respiratory:  Positive for shortness of breath.   Cardiovascular: Negative.   Gastrointestinal:  Positive for blood in stool and diarrhea.  Endocrine: Negative.   Genitourinary: Negative.    Musculoskeletal: Negative.   Skin: Negative.   Neurological: Negative.   Hematological: Negative.   Psychiatric/Behavioral: Negative.       Physical Exam:  height is 5\' 10"  (1.778 m) and weight is 226 lb (102.5 kg). His oral temperature is 98.3 F (36.8 C). His blood pressure is 162/72 (abnormal) and his pulse is 80. His respiration is 17 and oxygen saturation is 98%.   Wt Readings from Last 3 Encounters:  08/14/23 226 lb (102.5 kg)  08/12/23 227 lb 3.2 oz (103.1 kg)  07/23/23 232 lb (105.2 kg)    Physical Exam Vitals reviewed.  HENT:     Head: Normocephalic and atraumatic.  Eyes:     Pupils: Pupils are equal, round, and reactive to light.  Cardiovascular:     Rate and Rhythm: Normal rate and regular rhythm.     Heart sounds: Normal heart sounds.  Pulmonary:     Effort: Pulmonary effort is normal.     Breath sounds: Normal breath sounds.  Abdominal:     General: Bowel sounds are normal.     Palpations: Abdomen is soft.  Musculoskeletal:        General: No tenderness or deformity. Normal range of motion.     Cervical back: Normal range of motion.  Lymphadenopathy:     Cervical: No cervical adenopathy.  Skin:    General: Skin is warm and dry.     Findings: No erythema or rash.  Neurological:     Mental Status: He is alert and oriented to person, place, and time.  Psychiatric:        Behavior: Behavior normal.        Thought Content: Thought content normal.        Judgment: Judgment normal.    Lab Results  Component Value Date   WBC 6.6 08/14/2023   HGB 12.8 (L) 08/14/2023   HCT 37.4 (L) 08/14/2023   MCV 92.3 08/14/2023   PLT 213 08/14/2023     Chemistry      Component Value Date/Time   NA 136 07/23/2023 0838   K 4.0 07/23/2023 0838   CL 106 07/23/2023 0838   CO2 23 07/23/2023 0838   BUN 17 07/23/2023 0838   CREATININE 0.69 07/23/2023 0838   CREATININE 0.87 03/27/2023 0755      Component Value Date/Time   CALCIUM 9.3 07/23/2023 0838   ALKPHOS 57 07/23/2023 0838   AST 23 07/23/2023 0838   AST 28 03/27/2023 0755   ALT 36 07/23/2023 0838   ALT 24 03/27/2023 0755   BILITOT 0.6 07/23/2023  0838   BILITOT 0.6 03/27/2023 0755       Impression and Plan: Mr. Justin Rosales is a very nice 73 year old white male.  He has ulcerative colitis.  He had Clostridium diarrhea.  He became profoundly iron deficient.    Having his blood counts are doing great.  We will see what his iron levels look like.  I would think that this should be okay.  He definitely does not need any Retacrit today.  We will plan to him back to see [place] in another 4 months.  Josph Macho, MD 1/24/20258:46 AM

## 2023-08-27 ENCOUNTER — Ambulatory Visit (HOSPITAL_COMMUNITY)
Admission: RE | Admit: 2023-08-27 | Discharge: 2023-08-27 | Disposition: A | Discharge status: Home / Self Care | Payer: Medicare Other | Source: Ambulatory Visit | Attending: Internal Medicine | Admitting: Internal Medicine

## 2023-08-27 DIAGNOSIS — K51919 Ulcerative colitis, unspecified with unspecified complications: Secondary | ICD-10-CM | POA: Insufficient documentation

## 2023-08-27 MED ORDER — SODIUM CHLORIDE 0.9 % IV SOLN: INTRAVENOUS | Status: DC | PRN

## 2023-08-27 MED ORDER — SODIUM CHLORIDE 0.9 % IV SOLN
10.0000 mg/kg | INTRAVENOUS | Status: DC
  Administered 2023-08-27: 1000 mg via INTRAVENOUS
  Filled 2023-08-27: qty 100

## 2023-08-27 NOTE — Progress Notes (Signed)
 PATIENT CARE CENTER NOTE     Diagnosis: Ulcerative pancolitis with complication [facility]) [K51.019]       Provider: Aloha Finner MD     Procedure: Remicade  10mg /kg infusion and lab work     Note: Patient received Remicade  infusion (dose # 4 of 6) via PIV. No premeds required per orders.  Labs due every 2 months and were drawn last month. Infusion titrated per protocol. Patient tolerated well with no adverse reaction.  Vital signs stable. Patient declined AVS and will schedule next appointment at the front desk. Patient alert, oriented and ambulatory at discharge.

## 2023-09-03 ENCOUNTER — Encounter (HOSPITAL_COMMUNITY): Payer: Medicare Other

## 2023-09-30 ENCOUNTER — Ambulatory Visit (HOSPITAL_COMMUNITY)
Admission: RE | Admit: 2023-09-30 | Discharge: 2023-09-30 | Disposition: A | Discharge status: Home / Self Care | Payer: Medicare Other | Source: Ambulatory Visit | Attending: Internal Medicine | Admitting: Internal Medicine

## 2023-09-30 ENCOUNTER — Encounter: Payer: Self-pay | Admitting: Gastroenterology

## 2023-09-30 VITALS — BP 134/73 | HR 73 | Temp 98.2°F | Resp 16 | Wt 224.4 lb

## 2023-09-30 DIAGNOSIS — K51919 Ulcerative colitis, unspecified with unspecified complications: Secondary | ICD-10-CM | POA: Diagnosis present

## 2023-09-30 LAB — CBC WITH DIFFERENTIAL/PLATELET
Abs Immature Granulocytes: 0.01 10*3/uL (ref 0.00–0.07)
Basophils Absolute: 0 10*3/uL (ref 0.0–0.1)
Basophils Relative: 1 %
Eosinophils Absolute: 0.2 10*3/uL (ref 0.0–0.5)
Eosinophils Relative: 4 %
HCT: 39.6 % (ref 39.0–52.0)
Hemoglobin: 12.9 g/dL — ABNORMAL LOW (ref 13.0–17.0)
Immature Granulocytes: 0 %
Lymphocytes Relative: 34 %
Lymphs Abs: 1.2 10*3/uL (ref 0.7–4.0)
MCH: 31.5 pg (ref 26.0–34.0)
MCHC: 32.6 g/dL (ref 30.0–36.0)
MCV: 96.8 fL (ref 80.0–100.0)
Monocytes Absolute: 0.5 10*3/uL (ref 0.1–1.0)
Monocytes Relative: 15 %
Neutro Abs: 1.7 10*3/uL (ref 1.7–7.7)
Neutrophils Relative %: 46 %
Platelets: 250 10*3/uL (ref 150–400)
RBC: 4.09 MIL/uL — ABNORMAL LOW (ref 4.22–5.81)
RDW: 14.7 % (ref 11.5–15.5)
WBC: 3.7 10*3/uL — ABNORMAL LOW (ref 4.0–10.5)
nRBC: 0 % (ref 0.0–0.2)

## 2023-09-30 LAB — COMPREHENSIVE METABOLIC PANEL
ALT: 32 U/L (ref 0–44)
AST: 22 U/L (ref 15–41)
Albumin: 3.9 g/dL (ref 3.5–5.0)
Alkaline Phosphatase: 62 U/L (ref 38–126)
Anion gap: 9 (ref 5–15)
BUN: 23 mg/dL (ref 8–23)
CO2: 24 mmol/L (ref 22–32)
Calcium: 9.3 mg/dL (ref 8.9–10.3)
Chloride: 106 mmol/L (ref 98–111)
Creatinine, Ser: 0.78 mg/dL (ref 0.61–1.24)
GFR, Estimated: >60 mL/min (ref >60)
Glucose, Bld: 105 mg/dL — ABNORMAL HIGH (ref 70–99)
Potassium: 3.9 mmol/L (ref 3.5–5.1)
Sodium: 139 mmol/L (ref 135–145)
Total Bilirubin: 0.7 mg/dL (ref 0.0–1.2)
Total Protein: 7.2 g/dL (ref 6.5–8.1)

## 2023-09-30 LAB — C-REACTIVE PROTEIN: CRP: 0.5 mg/dL (ref <1.0)

## 2023-09-30 LAB — SEDIMENTATION RATE: Sed Rate: 27 mm/h — ABNORMAL HIGH (ref 0–16)

## 2023-09-30 MED ORDER — SODIUM CHLORIDE 0.9 % IV SOLN
10.0000 mg/kg | Freq: Once | INTRAVENOUS | Status: AC
  Administered 2023-09-30: 1000 mg via INTRAVENOUS
  Filled 2023-09-30: qty 100

## 2023-09-30 MED ORDER — SODIUM CHLORIDE 0.9 % IV SOLN: INTRAVENOUS | Status: DC | PRN

## 2023-09-30 NOTE — Progress Notes (Signed)
 PATIENT CARE CENTER NOTE   Diagnosis: Ulcerative pancolitis with complication [facility]) [K51.019]     Provider: Corliss Parish MD     Procedure: Remicade 10mg /kg infusion + labs     Note: Patient received Remicade infusion ( dose #5 of 6) via PIV. No premeds required per orders. Labs collected per orders ( CBC w/diff, CMP, CRP, Sed rate). Infusion titrated per protocol. Patient tolerated well with no adverse reaction. Vital signs stable. Pt declined AVS. Patient to RTC in 1 month for next infusion, states he has already scheduled this. Alert, oriented and ambulatory at discharge.

## 2023-10-01 ENCOUNTER — Encounter (HOSPITAL_COMMUNITY): Payer: Medicare Other

## 2023-10-05 ENCOUNTER — Encounter: Payer: Self-pay | Admitting: Family Medicine

## 2023-10-05 ENCOUNTER — Ambulatory Visit: Admitting: Family Medicine

## 2023-10-05 VITALS — BP 128/86 | HR 82 | Temp 97.9°F | Ht 70.0 in | Wt 227.6 lb

## 2023-10-05 DIAGNOSIS — L570 Actinic keratosis: Secondary | ICD-10-CM | POA: Diagnosis not present

## 2023-10-05 DIAGNOSIS — C44319 Basal cell carcinoma of skin of other parts of face: Secondary | ICD-10-CM | POA: Diagnosis not present

## 2023-10-05 DIAGNOSIS — D489 Neoplasm of uncertain behavior, unspecified: Secondary | ICD-10-CM

## 2023-10-05 NOTE — Progress Notes (Signed)
 Chief Complaint  Patient presents with   Skin Check    Patient presents today for have a spot on his face checked.    Justin Rosales is a 73 y.o. male here for a skin complaint.  Duration: 1 months Location: L cheeks Pruritic? Yes Painful? No Drainage? No New soaps/lotions/topicals/detergents? No Sick contacts? No Other associated symptoms: bleeding, scabs and then returns.  Therapies tried thus far: none  Skin lesion on nose present for nearly a year. Scales but does not bleed. Not growing. No pain, itching, drainage.  No new topicals.  Has not tried anything.  There is a skin lesion on the left side of the neck present for a few weeks.  No new lotions, soaps, topicals, or detergents.  Has not tried any thing at home.  No bleeding, pain, itching, or drainage.  Past Medical History:  Diagnosis Date   Allergy    Anemia    Arthritis    Blood transfusion without reported diagnosis    C. 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 No drainage, erythema, TTP, fluctuance, excessive warmth Psych: Age appropriate judgment and insight   Left anterior nose   Left lateral neck   Left cheek  Procedure note; shave biopsy Informed consent was obtained. The area was cleaned with alcohol and injected with 0.5 mL of 1% lidocaine with epinephrine. A Dermablade was slightly bent and used to cut  under the area of interest. The specimen was placed in a sterile specimen cup and sent to the lab. The area was then cauterized ensuring adequate hemostasis. The area was dressed with triple antibiotic ointment and a bandage. There were no complications noted. The patient tolerated the procedure well.  Procedure note: cryotherapy Verbal consent obtained 2 skin lesions treated Liquid nitrogen was applied via a thin spray creating an ice ball with 1-2 mm corona surrounding the lesion The patient tolerated the procedure well There were no immediate complications noted  Neoplasm of uncertain behavior - Plan: PR SHVG SKIN LESION 1 F/E/E/N/L/M DIAM 0.6-1.0 CM  Actinic keratoses - Plan: PR DESTRUCTION PREMALIGNANT LESION 1ST, PR DESTRUCTION PREMALIGNANT LESION 2-14 EA  Shave biopsy today.  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  Troponins are rising down. Cryotherapy today. F/u prn. The patient voiced understanding and agreement to the plan.  Jilda Roche [place], DO 10/05/23 11:46 AM

## 2023-10-05 NOTE — Patient Instructions (Addendum)
 Do not shower for the rest of the day. When you do wash it, use only soap and water. Do not vigorously scrub. Apply triple antibiotic ointment (like Neosporin) twice daily. Keep the area clean and dry.   Things to look out for: increasing pain not relieved by ibuprofen/acetaminophen, fevers, spreading redness, drainage of pus, or foul odor.  Give [place] 1 week to get the results of your biopsy back.  Let us know if you need anything.

## 2023-10-07 ENCOUNTER — Encounter: Payer: Self-pay | Admitting: Family Medicine

## 2023-10-07 ENCOUNTER — Other Ambulatory Visit: Payer: Self-pay | Admitting: Family Medicine

## 2023-10-07 DIAGNOSIS — C44310 Basal cell carcinoma of skin of unspecified parts of face: Secondary | ICD-10-CM

## 2023-10-07 LAB — DERMATOLOGY PATHOLOGY

## 2023-10-27 ENCOUNTER — Ambulatory Visit: Payer: Medicare Other | Admitting: Gastroenterology

## 2023-10-27 ENCOUNTER — Encounter: Payer: Self-pay | Admitting: Gastroenterology

## 2023-10-27 ENCOUNTER — Other Ambulatory Visit

## 2023-10-27 VITALS — BP 126/74 | HR 80 | Ht 70.0 in | Wt 231.4 lb

## 2023-10-27 DIAGNOSIS — K51019 Ulcerative (chronic) pancolitis with unspecified complications: Secondary | ICD-10-CM | POA: Diagnosis not present

## 2023-10-27 DIAGNOSIS — K529 Noninfective gastroenteritis and colitis, unspecified: Secondary | ICD-10-CM

## 2023-10-27 DIAGNOSIS — Z9225 Personal history of immunosupression therapy: Secondary | ICD-10-CM | POA: Diagnosis not present

## 2023-10-27 NOTE — Patient Instructions (Addendum)
 Appointment with Dr. Doy Hutching on 12/31/23 at 1:30 pm.   Your provider has requested that you go to the basement level for lab work before leaving today. Press "B" on the elevator. The lab is located at the first door on the left as you exit the elevator.  Due to recent changes in healthcare laws, you may see the results of your imaging and laboratory studies on MyChart before your provider has had a chance to review them.  We understand that in some cases there may be results that are confusing or concerning to you. Not all laboratory results come back in the same time frame and the provider may be waiting for multiple results in order to interpret others.  Please give [place] 48 hours in order for your provider to thoroughly review all the results before contacting the office for clarification of your results.   Thank you for choosing me and [facility].  Dr. Meridee Score

## 2023-10-27 NOTE — Progress Notes (Unsigned)
 GASTROENTEROLOGY OUTPATIENT CLINIC VISIT   Primary Care Provider Sharlene Dory, DO [address] [place] [address] [phone]  Patient Profile: Justin Rosales is a 73 y.o. male with a pmh significant for chronic panulcerative colitis (now on + Remicade 10mg /kg Q4W), prior C. difficile infection (requiring p.o. vancomycin and fidaxomycin), history of Candida esophagitis, chronic anemia with iron deficiency (s/p IV Iron infusions), hypertension, hyperlipidemia, prior VTE (now off AC).  The patient presents to the [facility] for an evaluation and management of problem(s) noted below:  Problem List No diagnosis found.   History of Present Illness Please see prior notes for full details of HPI.  Interval History The patient returns for scheduled follow-up (today he is unaccompanied).  Patient states no significant changes since his last clinic visit with [place].  I have seen his laboratories every other infusion and overall things have been relatively stable.  He does still have an elevated ESR and his fecal calprotectin earlier this year still remained elevated but certainly much improved from prior.  He is ready to schedule another colonoscopy to see where things stand this year.  He thinks this will help him and [place] decide upon potential needs for transitioning medications.  He is not moving to [place] at this point.  BMs per day -1-2 (formed to semiformed mostly) Nocturnal BMs -none Blood -none Mucous -none Tenesmus -none Urgency -none Skin Manifestations -none Eye Manifestations -none Joint Manifestations -none  GI Review of Systems Positive as above Negative for dysphagia, odynophagia, pain, nausea, vomiting, early satiety, melena, hematochezia   Review of Systems General: Denies fevers/chills/weight loss unintentionally HEENT: Denies new oral lesions Cardiovascular: Denies chest pain Pulmonary: Denies shortness of  breath Gastroenterological: See HPI Genitourinary: Denies darkened urine Hematological: Bruising is improved from prior now that he is off anticoagulation Dermatological: Denies jaundice Psychological: Mood is stable   Medications Current Outpatient Medications  Medication Sig Dispense Refill   amLODipine-valsartan (EXFORGE) 5-160 MG tablet TAKE 1 TABLET BY MOUTH DAILY 120 tablet 2   aspirin 81 MG EC tablet Take 1 tablet (81 mg total) by mouth daily. Swallow whole. 30 tablet 12   Bepotastine Besilate 1.5 % SOLN 1 drop 2 (two) times daily.     Cholecalciferol (DIALYVITE VITAMIN D 5000) 125 MCG (5000 UT) capsule Take 5,000 Units by mouth daily.     diphenhydrAMINE HCl, Sleep, (ZZZQUIL PO) Take 10 mLs by mouth daily as needed (sleep).     folic acid (FOLVITE) 1 MG tablet TAKE 2 TABLETS BY MOUTH DAILY 240 tablet 2   inFLIXimab in sodium chloride 0.9 % Inject into the vein. 01/16/2021 To start taking every 4 weeks.     mercaptopurine (PURINETHOL) 50 MG tablet Take 2 tablets (100 mg total) by mouth daily. 180 tablet 3   Multiple Vitamin (MULTIVITAMIN WITH MINERALS) TABS tablet Take 1 tablet by mouth daily.     NON FORMULARY Take 1 tablet by mouth daily. Vital reds-energy supplement     rosuvastatin (CRESTOR) 20 MG tablet TAKE 1 TABLET BY MOUTH DAILY 120 tablet 2   Saccharomyces boulardii (FLORASTOR PO) Take 1 capsule by mouth in the morning and at bedtime.     sildenafil (VIAGRA) 100 MG tablet Take 1 tablet (100 mg total) by mouth daily as needed for erectile dysfunction. 30 tablet 2   No current facility-administered medications for this visit.    Allergies No Known Allergies  Histories Past Medical History:  Diagnosis Date   Allergy  Anemia    Arthritis    Blood transfusion without reported diagnosis    C. 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 Dr Loman Chroman possibly With [facility] x2. last one done around 2012    Social History   Socioeconomic History   Marital status: Married    Spouse name: [name]   Number of children: 2   Years of education: Not on file   Highest education level: Associate degree: academic program  Occupational History   Occupation: Estate agent   Occupation: retired  Tobacco Use   Smoking status: Former    Current packs/day: 0.00    Average packs/day: 1 pack/day for 30.0 years (30.0 ttl pk-yrs)    Types: Cigarettes    Start date: 85    Quit date: 2001    Years since quitting: 24.2   Smokeless tobacco: Never  Vaping Use   Vaping status: Never Used  Substance and Sexual Activity   Alcohol use: Yes    Alcohol/week: 1.0 standard drink of alcohol    Types: 1 Cans of beer per week    Comment: ocassionally   Drug use: Not Currently   Sexual activity: Yes    Birth control/protection: None  Other Topics Concern   Not on file  Social History Narrative   Not on file   Social Drivers of Health   Financial Resource Strain: Low Risk  (08/12/2023)   Overall Financial Resource Strain (CARDIA)    Difficulty of Paying Living Expenses: Not hard at all  Food Insecurity: No Food Insecurity (08/12/2023)   Hunger Vital Sign    Worried About Running Out of Food in the Last Year: Never true    Ran Out of Food in the Last Year: Never true  Transportation Needs: No Transportation Needs (08/12/2023)   PRAPARE - Administrator, Civil Service (Medical): No    Lack of Transportation (Non-Medical): No   Physical Activity: Insufficiently Active (08/12/2023)   Exercise Vital Sign    Days of Exercise per Week: 3 days    Minutes of Exercise per Session: 20 min  Stress: No Stress Concern Present (12/11/2022)   [employer] of Occupational Health - Occupational Stress Questionnaire    Feeling of Stress : Not at all  Social Connections: Socially Integrated (08/12/2023)   Social Connection and Isolation Panel [NHANES]    Frequency of Communication with Friends and Family: More than three times a week    Frequency of Social Gatherings with Friends and Family: More than three times a week    Attends Religious Services: 1 to 4 times per year    Active Member of [employer] or Organizations: Patient declined    Attends Engineer, structural: More than 4 times per year    Marital Status: Married  Catering manager Violence: Not At Risk (12/18/2022)   Humiliation, Afraid, Rape, and Kick questionnaire    Fear of Current or Ex-Partner: No    Emotionally  Abused: No    Physically Abused: No    Sexually Abused: No   Family History  Problem Relation Age of Onset   Hypertension Mother    Diabetes Mother    Alzheimer's disease Mother    Arthritis Mother    Parkinson's disease Father    Alzheimer's disease Father    Dementia Sister    Colon cancer Neg Hx    Esophageal cancer Neg Hx    Rectal cancer Neg Hx    Stomach cancer Neg Hx    I have reviewed his medical, social, and family history in detail and updated the electronic medical record as necessary.    PHYSICAL EXAMINATION  BP 126/74   Pulse 80   Ht 5\' 10"  (1.778 m)   Wt 231 lb 6 oz (105 kg)   SpO2 96%   BMI 33.20 kg/m  Wt Readings from Last 3 Encounters:  10/27/23 231 lb 6 oz (105 kg)  10/05/23 227 lb 9.6 oz (103.2 kg)  09/30/23 224 lb 6.4 oz (101.8 kg)  GEN: NAD, appears stated age, accompanied by wife PSYCH: Cooperative, without pressured speech EYE: Conjunctivae pink, sclerae anicteric ENT: MMM CV: Nontachycardic RESP: No  audible wheezing GI: NABS, soft, rounded, NT/ND, without rebound MSK/EXT: No lower extremity edema SKIN: No jaundice NEURO:  Alert & Oriented x 3, no focal deficits   REVIEW OF DATA  I reviewed the following data at the time of this encounter:  GI Procedures and Studies  Previously reviewed  Laboratory Studies  Reviewed those in epic and care everywhere  Imaging Studies  No new imaging studies to review    ASSESSMENT  Mr. Tieu is a 73 y.o. male with a pmh significant for chronic panulcerative colitis (now on + Remicade 10mg /kg Q4W), prior C. difficile infection (requiring p.o. vancomycin and fidaxomycin), history of Candida esophagitis, chronic anemia with iron deficiency (s/p IV Iron infusions), hypertension, hyperlipidemia, prior VTE (now off AC).  The patient is seen today for evaluation and management of:  No diagnosis found.  The patient is both clinically and hemodynamically stable at this time.  He has a near clinical remission but endoscopically with still having evidence of mucosal inflammation and elevations in his fecal calprotectin.  We have previously discussed consideration of Entyvio or Stelara or Skyrizi and now there is approval for Cristy Folks so it certainly could be considered as well.  We cannot move his 6-MP dosing any further.  We are going to plan to repeat his colonoscopy this year.  We will get a fecal calprotectin to be drawn right before or within a couple weeks of the colonoscopy being completed.  He is going to get Remicade therapeutic drug monitoring performed either in November or December.  As I have spoken with him and his wife previously my concern is that if we try to transition him and he fails we will we lose the ability to go back to Remicade, time will tell.  I will likely discuss his case with one of my colleagues as I have previously but for now he does feel well he is not having overt or progressive anemia and he is comfortable with his current  infusion scheduling.    The risks and benefits of endoscopic evaluation were discussed with the patient; these include but are not limited to the risk of perforation, infection, bleeding, missed lesions, lack of diagnosis, severe illness requiring hospitalization, as well as anesthesia and sedation related illnesses.  The patient and/or family is agreeable  to proceed.  All patient questions were answered to the best of my ability, and the patient agrees to the aforementioned plan of action with follow-up as indicated.   PLAN  Continue Remicade 10 mg/kg every 4 weeks Continue 6-MP 75 mg daily ESR/CRP/CMP/CBC with every other infusion Proceed with scheduling colonoscopy later this year Obtain fecal calprotectin around the time of his colonoscopy TDM to be drawn in November or December (prior to infusion) Consider Entyvio/Stelara/Skyrizi pending his results from above   No orders of the defined types were placed in this encounter.    New Prescriptions   No medications on file   Modified Medications   No medications on file    Planned Follow Up No follow-ups on file.   Total Time in Face-to-Face and in Coordination of Care for patient including independent/personal interpretation/review of prior testing, medical history, examination, medication adjustment, communicating results with the patient directly, and documentation with the EHR is 25 minutes.   Corliss Parish, MD [facility] Advanced Endoscopy Office # [phone]

## 2023-10-28 ENCOUNTER — Encounter: Payer: Self-pay | Admitting: Gastroenterology

## 2023-10-29 ENCOUNTER — Non-Acute Institutional Stay (HOSPITAL_COMMUNITY)
Admission: RE | Admit: 2023-10-29 | Discharge: 2023-10-29 | Disposition: A | Discharge status: Home / Self Care | Payer: Medicare Other | Source: Ambulatory Visit | Attending: Internal Medicine | Admitting: Internal Medicine

## 2023-10-29 ENCOUNTER — Other Ambulatory Visit: Payer: Self-pay

## 2023-10-29 DIAGNOSIS — K51019 Ulcerative (chronic) pancolitis with unspecified complications: Secondary | ICD-10-CM

## 2023-10-29 MED ORDER — SODIUM CHLORIDE 0.9 % IV SOLN: INTRAVENOUS | Status: DC | PRN

## 2023-10-29 MED ORDER — SODIUM CHLORIDE 0.9 % IV SOLN
10.0000 mg/kg | INTRAVENOUS | Status: DC
  Administered 2023-10-29: 1100 mg via INTRAVENOUS
  Filled 2023-10-29: qty 110

## 2023-10-29 NOTE — Progress Notes (Signed)
 PATIENT CARE CENTER NOTE     Diagnosis: Ulcerative pancolitis with complication [facility]) [K51.019]       Provider: Corliss Parish MD     Procedure: Remicade 10mg /kg infusion     Note: Patient received Remicade infusion (dose # 6 of 6) via PIV. No premeds required per orders.  Labs due every 2 months and were drawn last month. Infusion titrated per protocol. Patient tolerated well with no adverse reaction.  Vital signs stable. Patient declined AVS and has already scheduled next appointment. Patient alert, oriented and ambulatory at discharge.

## 2023-11-02 LAB — THIOPURINE METABOLITES
6 MMP(6-Methylmercaptopurine): 2943 pmol/8x10(8)RBC (ref <5700)
6 TG(6-Thioguanine): 250 pmol/8x10(8)RBC (ref 235–400)

## 2023-11-04 ENCOUNTER — Encounter: Payer: Self-pay | Admitting: Gastroenterology

## 2023-11-05 LAB — SERIAL MONITORING

## 2023-11-05 LAB — INFLIXIMAB+AB (SERIAL MONITOR): Infliximab Drug Level: 31 ug/mL

## 2023-11-07 ENCOUNTER — Encounter: Payer: Self-pay | Admitting: Gastroenterology

## 2023-11-26 ENCOUNTER — Ambulatory Visit (HOSPITAL_COMMUNITY)
Admission: RE | Admit: 2023-11-26 | Discharge: 2023-11-26 | Disposition: A | Discharge status: Home / Self Care | Payer: Medicare Other | Source: Ambulatory Visit | Attending: Internal Medicine | Admitting: Internal Medicine

## 2023-11-26 DIAGNOSIS — K51019 Ulcerative (chronic) pancolitis with unspecified complications: Secondary | ICD-10-CM | POA: Diagnosis present

## 2023-11-26 LAB — CBC WITH DIFFERENTIAL/PLATELET
Abs Immature Granulocytes: 0.01 10*3/uL (ref 0.00–0.07)
Basophils Absolute: 0 10*3/uL (ref 0.0–0.1)
Basophils Relative: 1 %
Eosinophils Absolute: 0.2 10*3/uL (ref 0.0–0.5)
Eosinophils Relative: 6 %
HCT: 41.7 % (ref 39.0–52.0)
Hemoglobin: 13.6 g/dL (ref 13.0–17.0)
Immature Granulocytes: 0 %
Lymphocytes Relative: 35 %
Lymphs Abs: 1.4 10*3/uL (ref 0.7–4.0)
MCH: 31.6 pg (ref 26.0–34.0)
MCHC: 32.6 g/dL (ref 30.0–36.0)
MCV: 97 fL (ref 80.0–100.0)
Monocytes Absolute: 0.5 10*3/uL (ref 0.1–1.0)
Monocytes Relative: 12 %
Neutro Abs: 1.7 10*3/uL (ref 1.7–7.7)
Neutrophils Relative %: 46 %
Platelets: 198 10*3/uL (ref 150–400)
RBC: 4.3 MIL/uL (ref 4.22–5.81)
RDW: 15.1 % (ref 11.5–15.5)
WBC: 3.8 10*3/uL — ABNORMAL LOW (ref 4.0–10.5)
nRBC: 0 % (ref 0.0–0.2)

## 2023-11-26 LAB — COMPREHENSIVE METABOLIC PANEL WITH GFR
ALT: 38 U/L (ref 0–44)
AST: 36 U/L (ref 15–41)
Albumin: 3.9 g/dL (ref 3.5–5.0)
Alkaline Phosphatase: 66 U/L (ref 38–126)
Anion gap: 9 (ref 5–15)
BUN: 20 mg/dL (ref 8–23)
CO2: 23 mmol/L (ref 22–32)
Calcium: 9.1 mg/dL (ref 8.9–10.3)
Chloride: 107 mmol/L (ref 98–111)
Creatinine, Ser: 0.69 mg/dL (ref 0.61–1.24)
GFR, Estimated: >60 mL/min (ref >60)
Glucose, Bld: 113 mg/dL — ABNORMAL HIGH (ref 70–99)
Potassium: 4.4 mmol/L (ref 3.5–5.1)
Sodium: 139 mmol/L (ref 135–145)
Total Bilirubin: 1 mg/dL (ref 0.0–1.2)
Total Protein: 7.6 g/dL (ref 6.5–8.1)

## 2023-11-26 LAB — SEDIMENTATION RATE: Sed Rate: 15 mm/h (ref 0–16)

## 2023-11-26 MED ORDER — SODIUM CHLORIDE 0.9 % IV SOLN: INTRAVENOUS | Status: DC | PRN

## 2023-11-26 MED ORDER — SODIUM CHLORIDE 0.9 % IV SOLN
10.0000 mg/kg | INTRAVENOUS | Status: DC
  Administered 2023-11-26: 1000 mg via INTRAVENOUS
  Filled 2023-11-26: qty 100

## 2023-11-26 NOTE — Progress Notes (Signed)
 PATIENT CARE CENTER NOTE     Diagnosis: Ulcerative pancolitis with complication [facility]) [K51.019]       Provider: Yong Henle MD     Procedure: Remicade  10mg /kg infusion and lab work     Note: Patient received Remicade  infusion (dose # 1 of 6) via PIV. No premeds required per orders.  Labs drawn (CBC w/diff, CMP, CRP, Sed Rate) prior to infusion and will be due again in 2 months. Infusion titrated per protocol. Patient tolerated well with no adverse reaction.  Vital signs stable. Patient declined AVS and has already scheduled next appointment. Patient alert, oriented and ambulatory at discharge.

## 2023-11-27 LAB — HIGH SENSITIVITY CRP: CRP, High Sensitivity: 3.4 mg/L — ABNORMAL HIGH (ref 0.00–3.00)

## 2023-11-28 ENCOUNTER — Encounter: Payer: Self-pay | Admitting: Gastroenterology

## 2023-11-30 ENCOUNTER — Other Ambulatory Visit: Payer: Self-pay

## 2023-11-30 DIAGNOSIS — K51019 Ulcerative (chronic) pancolitis with unspecified complications: Secondary | ICD-10-CM

## 2023-12-07 ENCOUNTER — Ambulatory Visit: Payer: Medicare Other | Admitting: Family Medicine

## 2023-12-16 ENCOUNTER — Telehealth: Payer: Self-pay

## 2023-12-16 DIAGNOSIS — K51019 Ulcerative (chronic) pancolitis with unspecified complications: Secondary | ICD-10-CM

## 2023-12-16 NOTE — Telephone Encounter (Signed)
 April I was told to send this to you since his infusions are at the pt care center and not  [facility] infusion. Dr Brice Campi wants the pt to continue remicade  every 4 weeks but insurance is denying the frequency.       Z6109 - noncovered-exceeds frequency - his next appointment is on 12/24/23  ? Boner, [employer], [place];  Seward Dao  Thank you for your quick reply. I will let the patient know it is being worked on by your team.   Best,     Natalie     From: Mansouraty, Coye Diver [email or web address]> Sent: Wednesday, Dec 16, 2023 3:06 PM To: Boner, Natalie [email or web address]>; Delroy Fields, Azhane Eckart [email or web address]> Subject: Re: J1745 - noncovered-exceeds frequency - his next appointment is on 12/24/23     Thanks for this.  This has allowed him to achieve near clinical remission though endoscopic remission remains an issue.  I will forward to my team, so we can begin the Appeals process.     Michaeal Davis,  How do we begin the appeal process for this or PTP?  Thanks.     With best regards,   Yong Henle, MD   [facility]   Advanced Therapeutic Endoscopy   From: Boner, Natalie [email or web address]> Sent: Wednesday, Dec 16, 2023 2:58 PM To: Mansouraty, Gabriel [email or web address]> Subject: FW: J1745 - noncovered-exceeds frequency - his next appointment is on 12/24/23     Dr. Brice Campi,     The patient below asked me to forward this email to you regarding his infusions ordered by your office. He receives his infusions at the Patient Care Center and has his next one scheduled for 12/24/23     Please advise,           Cornelius Dill, [facility]  Primary Care [facility]  [facility] Administrator  Direct Dial: [phone]  Cell: [phone]  Fax: [phone]  Website:  [email or web address]        From: Duey Ghent [email or web address]> Sent: Wednesday, Dec 16, 2023 12:10 PM To: Boner, Natalie [email or web address]> Cc: DeHart, Stephanie [email or web address]> Subject: J1745 - noncovered-exceeds frequency - his next appointment is on 12/24/23     Good Morning,     I have the patient below that [facility] Medicare is denying J1745 for noncovered/exceeds frequency. The frequency of the service is rendered as requested per the physician (Dr. Yong Henle). The patient is scheduled to come in for his next appointment on 12/24/23.          [id number] - DOS 06/17/22 - denied - Reconsideration submitted & upheld     Per UHC: The claim was processed correctly. For that reason, we are upholding the original decision on CPT code J1745 because, after further investigation, it was identified that the code has been billed without the matching diagnosis code, and it is also denied because the administration code is more frequent than FDA guidelines. For more details, please refer to Medications/Drugs (Outpatient/Part B) - Medicare Advantage Coverage Summary ([email or web address]), Maximum Dosage and Frequency - Marketing executive ([email or web address]). Thank you for giving us  the opportunity to address your concerns. We appreciate your patience while the team worked to identify and resolve the issue. If you disagree with this determination, the provider may move forward with the appeals process. In your formal appeal, please provide a formal appeal letter that expresses the intent to appeal and all supporting documentation and medical records, as  applicable. You may submit an appeal online via LINK or the Provider Portal. How this decision was made Our review determined benefits for this claim based on the member's health plan, UnitedHealthcare reimbursement policies and the terms of your Participation Agreement. Our review was  also based on guidance from the Centers for Medicare & Medicaid Services (CMS).        [id number] - DOS 08/14/22 - denied - Reconsideration submitted & upheld. Appeal submitted & upheld  [id number] - DOS 02/05/23 - denied - Reconsideration submitted & upheld  [id number] - DOS 04/02/23 - denied - Reconsideration submitted & upheld  [id number] - DOS 11/26/23 -- still in process with [facility]     PLAN: Continue Remicade  10 mg/kg every 4 weeks     Per the FDA site: Ulcerative Colitis 5mg /kg at 0, 2 and 6 weeks, then every 8 weeks.             Per CMS guidelines: Article - Billing and Coding: Infliximab  and biosimilars ([id number])     Approved Dx per Remicade  guidelines:  billing-guide-for-remicade -and-infliximab .pdf          Thanks,  Duey Ghent  Denial Coordinator  [place] [facility] Revenue Integrity  Direct Dial: [phone] l Fax: [phone]  [email or web address]>

## 2023-12-19 ENCOUNTER — Telehealth: Payer: Self-pay

## 2023-12-19 NOTE — Telephone Encounter (Signed)
 Auth Submission: APPROVED Site of care: Site of care: [facility] Payer: UHC medicare Medication & CPT/J Code(s) submitted: Remicade  (Infliximab ) J1745 Route of submission (phone, fax, portal): portal Phone # Fax # Auth type: Buy/Bill PB Units/visits requested: 10mg /kg every 4 weeks for 12 cycles Reference number: [id number] Approval from: 12/19/23 to 12/18/24   Approval letter is in the media tab.

## 2023-12-24 ENCOUNTER — Ambulatory Visit (HOSPITAL_COMMUNITY)
Admission: RE | Admit: 2023-12-24 | Discharge: 2023-12-24 | Disposition: A | Discharge status: Home / Self Care | Payer: Medicare Other | Source: Ambulatory Visit | Attending: Internal Medicine | Admitting: Internal Medicine

## 2023-12-24 DIAGNOSIS — K51019 Ulcerative (chronic) pancolitis with unspecified complications: Secondary | ICD-10-CM | POA: Insufficient documentation

## 2023-12-24 MED ORDER — SODIUM CHLORIDE 0.9 % IV SOLN: INTRAVENOUS | Status: DC | PRN

## 2023-12-24 MED ORDER — SODIUM CHLORIDE 0.9 % IV SOLN: 10.0000 mg/kg | Freq: Once | INTRAVENOUS | Status: DC

## 2023-12-24 MED ORDER — SODIUM CHLORIDE 0.9 % IV SOLN
1000.0000 mg | INTRAVENOUS | Status: DC
  Administered 2023-12-24: 1000 mg via INTRAVENOUS
  Filled 2023-12-24: qty 100

## 2023-12-24 NOTE — Progress Notes (Signed)
 PATIENT CARE CENTER NOTE  Diagnosis: Ulcerative pancolitis with complication (HCC) [K51.019]    Provider: Yong Henle MD   Procedure: Remicade  10mg /kg infusion (1,000 mg)   Note: Patient received Remicade  infusion (dose #2 of 6) via PIV. No pre-meds required per orders. Labs due every 2 month, and were drawn last month. Infusion titrated per protocol. Pt tolerated the infusion well with no adverse reaction. Vital signs are stable. Pt declined AVS. Pts next infusion schedule has already been scheduled. Pt is alert, oriented, and ambulatory at discharge.

## 2023-12-25 ENCOUNTER — Ambulatory Visit: Payer: Self-pay | Admitting: Hematology & Oncology

## 2023-12-25 ENCOUNTER — Encounter: Payer: Self-pay | Admitting: Hematology & Oncology

## 2023-12-25 ENCOUNTER — Inpatient Hospital Stay: Payer: Medicare Other | Attending: Hematology & Oncology

## 2023-12-25 ENCOUNTER — Inpatient Hospital Stay (HOSPITAL_BASED_OUTPATIENT_CLINIC_OR_DEPARTMENT_OTHER): Payer: Medicare Other | Admitting: Hematology & Oncology

## 2023-12-25 VITALS — BP 147/62 | HR 80 | Temp 98.3°F | Resp 18 | Ht 70.0 in | Wt 229.0 lb

## 2023-12-25 DIAGNOSIS — K909 Intestinal malabsorption, unspecified: Secondary | ICD-10-CM | POA: Diagnosis not present

## 2023-12-25 DIAGNOSIS — K51911 Ulcerative colitis, unspecified with rectal bleeding: Secondary | ICD-10-CM | POA: Diagnosis not present

## 2023-12-25 DIAGNOSIS — N189 Chronic kidney disease, unspecified: Secondary | ICD-10-CM | POA: Diagnosis not present

## 2023-12-25 DIAGNOSIS — D631 Anemia in chronic kidney disease: Secondary | ICD-10-CM | POA: Insufficient documentation

## 2023-12-25 DIAGNOSIS — I82431 Acute embolism and thrombosis of right popliteal vein: Secondary | ICD-10-CM | POA: Diagnosis not present

## 2023-12-25 DIAGNOSIS — K51219 Ulcerative (chronic) proctitis with unspecified complications: Secondary | ICD-10-CM

## 2023-12-25 DIAGNOSIS — D500 Iron deficiency anemia secondary to blood loss (chronic): Secondary | ICD-10-CM | POA: Insufficient documentation

## 2023-12-25 LAB — CBC WITH DIFFERENTIAL (CANCER CENTER ONLY)
Abs Immature Granulocytes: 0.02 10*3/uL (ref 0.00–0.07)
Basophils Absolute: 0 10*3/uL (ref 0.0–0.1)
Basophils Relative: 1 %
Eosinophils Absolute: 0.3 10*3/uL (ref 0.0–0.5)
Eosinophils Relative: 6 %
HCT: 38.4 % — ABNORMAL LOW (ref 39.0–52.0)
Hemoglobin: 13 g/dL (ref 13.0–17.0)
Immature Granulocytes: 0 %
Lymphocytes Relative: 40 %
Lymphs Abs: 1.9 10*3/uL (ref 0.7–4.0)
MCH: 31.4 pg (ref 26.0–34.0)
MCHC: 33.9 g/dL (ref 30.0–36.0)
MCV: 92.8 fL (ref 80.0–100.0)
Monocytes Absolute: 0.4 10*3/uL (ref 0.1–1.0)
Monocytes Relative: 9 %
Neutro Abs: 2.1 10*3/uL (ref 1.7–7.7)
Neutrophils Relative %: 44 %
Platelet Count: 226 10*3/uL (ref 150–400)
RBC: 4.14 MIL/uL — ABNORMAL LOW (ref 4.22–5.81)
RDW: 14.8 % (ref 11.5–15.5)
WBC Count: 4.7 10*3/uL (ref 4.0–10.5)
nRBC: 0 % (ref 0.0–0.2)

## 2023-12-25 LAB — CMP (CANCER CENTER ONLY)
ALT: 33 U/L (ref 0–44)
AST: 27 U/L (ref 15–41)
Albumin: 4.2 g/dL (ref 3.5–5.0)
Alkaline Phosphatase: 68 U/L (ref 38–126)
Anion gap: 7 (ref 5–15)
BUN: 17 mg/dL (ref 8–23)
CO2: 25 mmol/L (ref 22–32)
Calcium: 9.4 mg/dL (ref 8.9–10.3)
Chloride: 105 mmol/L (ref 98–111)
Creatinine: 0.92 mg/dL (ref 0.61–1.24)
GFR, Estimated: >60 mL/min (ref >60)
Glucose, Bld: 114 mg/dL — ABNORMAL HIGH (ref 70–99)
Potassium: 4.2 mmol/L (ref 3.5–5.1)
Sodium: 137 mmol/L (ref 135–145)
Total Bilirubin: 0.7 mg/dL (ref 0.0–1.2)
Total Protein: 7.3 g/dL (ref 6.5–8.1)

## 2023-12-25 LAB — IRON AND IRON BINDING CAPACITY (CC-WL,HP ONLY)
Iron: 88 ug/dL (ref 45–182)
Saturation Ratios: 27 % (ref 17.9–39.5)
TIBC: 329 ug/dL (ref 250–450)
UIBC: 241 ug/dL (ref 117–376)

## 2023-12-25 LAB — FERRITIN: Ferritin: 159 ng/mL (ref 24–336)

## 2023-12-25 LAB — RETICULOCYTES
Immature Retic Fract: 11.1 % (ref 2.3–15.9)
RBC.: 4.16 MIL/uL — ABNORMAL LOW (ref 4.22–5.81)
Retic Count, Absolute: 72.8 10*3/uL (ref 19.0–186.0)
Retic Ct Pct: 1.8 % (ref 0.4–3.1)

## 2023-12-25 NOTE — Progress Notes (Signed)
 Hematology and Oncology Follow Up Visit  Justin Rosales [id number] [birth year] 73 y.o. 12/25/2023   Principle Diagnosis:  Iron  deficiency anemia secondary to GI blood loss of malabsorption secondary to ulcerative colitis Thromboembolism in the right popliteal vein Erythropoietin  deficient anemia  Current Therapy:   IV iron  as needed - Monoferric  given on 12/02/2022   Retacrit  40,000 units subcu weekly for Hgb <11     Interim History:  Justin Rosales is back for follow-up.  We last saw him back in January.  At that time, he had been doing well.  He was up in [place] for part of May.  He is feeling well up to [facility] and went to [place].  The prior drove about 3000 miles.  Sounds like a lot of fairly quiet Summer.  He has had no problems with the ulcerative colitis.  These go see a new gastroenterologist.  He currently is on Remicade  every 4 weeks.  He does take the 2 purine.  His iron  studies have been doing okay.  When we last saw him, his ferritin was 354 with an iron  saturation of 27%.  He has had no melena or bright red blood per rectum.  He has had no fever.  He has had no nausea or vomiting.  There is been no rashes.  He has had no leg swelling.  He has had no problems with COVID or Influenza.  Overall, I would say his performance status is probably ECOG 1.        Current Outpatient Medications:    amLODipine -valsartan  (EXFORGE ) 5-160 MG tablet, TAKE 1 TABLET BY MOUTH DAILY, Disp: 120 tablet, Rfl: 2   aspirin  81 MG EC tablet, Take 1 tablet (81 mg total) by mouth daily. Swallow whole., Disp: 30 tablet, Rfl: 12   Bepotastine Besilate 1.5 % SOLN, 1 drop 2 (two) times daily., Disp: , Rfl:    Cholecalciferol (DIALYVITE VITAMIN D 5000) 125 MCG (5000 UT) capsule, Take 5,000 Units by mouth daily., Disp: , Rfl:    diphenhydrAMINE HCl, Sleep, (ZZZQUIL PO), Take 10 mLs by mouth daily as needed (sleep)., Disp: , Rfl:    folic acid  (FOLVITE ) 1 MG tablet, TAKE 2 TABLETS BY MOUTH DAILY, Disp:  240 tablet, Rfl: 2   inFLIXimab  in sodium chloride  0.9 %, Inject into the vein. 01/16/2021 To start taking every 4 weeks., Disp: , Rfl:    mercaptopurine  (PURINETHOL ) 50 MG tablet, Take 2 tablets (100 mg total) by mouth daily., Disp: 180 tablet, Rfl: 3   Multiple Vitamin (MULTIVITAMIN WITH MINERALS) TABS tablet, Take 1 tablet by mouth daily., Disp: , Rfl:    NON FORMULARY, Take 1 tablet by mouth daily. Vital reds-energy supplement, Disp: , Rfl:    rosuvastatin  (CRESTOR ) 20 MG tablet, TAKE 1 TABLET BY MOUTH DAILY, Disp: 120 tablet, Rfl: 2   Saccharomyces boulardii (FLORASTOR PO), Take 1 capsule by mouth in the morning and at bedtime., Disp: , Rfl:    sildenafil  (VIAGRA ) 100 MG tablet, Take 1 tablet (100 mg total) by mouth daily as needed for erectile dysfunction., Disp: 30 tablet, Rfl: 2  Allergies: No Known Allergies  Past Medical History, Surgical history, Social history, and Family History were reviewed and updated.  Review of Systems: Review of Systems  Constitutional:  Positive for fatigue.  HENT:  Negative.    Eyes: Negative.   Respiratory:  Positive for shortness of breath.   Cardiovascular: Negative.   Gastrointestinal:  Positive for blood in stool and diarrhea.  Endocrine: Negative.  Genitourinary: Negative.    Musculoskeletal: Negative.   Skin: Negative.   Neurological: Negative.   Hematological: Negative.   Psychiatric/Behavioral: Negative.      Physical Exam:  height is 5\' 10"  (1.778 m) and weight is 229 lb (103.9 kg). His oral temperature is 98.3 F (36.8 C). His blood pressure is 147/62 (abnormal) and his pulse is 80. His respiration is 18 and oxygen saturation is 98%.   Wt Readings from Last 3 Encounters:  12/25/23 229 lb (103.9 kg)  12/24/23 231 lb (104.8 kg)  11/26/23 228 lb (103.4 kg)    Physical Exam Vitals reviewed.  HENT:     Head: Normocephalic and atraumatic.  Eyes:     Pupils: Pupils are equal, round, and reactive to light.  Cardiovascular:      Rate and Rhythm: Normal rate and regular rhythm.     Heart sounds: Normal heart sounds.  Pulmonary:     Effort: Pulmonary effort is normal.     Breath sounds: Normal breath sounds.  Abdominal:     General: Bowel sounds are normal.     Palpations: Abdomen is soft.  Musculoskeletal:        General: No tenderness or deformity. Normal range of motion.     Cervical back: Normal range of motion.  Lymphadenopathy:     Cervical: No cervical adenopathy.  Skin:    General: Skin is warm and dry.     Findings: No erythema or rash.  Neurological:     Mental Status: He is alert and oriented to person, place, and time.  Psychiatric:        Behavior: Behavior normal.        Thought Content: Thought content normal.        Judgment: Judgment normal.    Lab Results  Component Value Date   WBC 4.7 12/25/2023   HGB 13.0 12/25/2023   HCT 38.4 (L) 12/25/2023   MCV 92.8 12/25/2023   PLT 226 12/25/2023     Chemistry      Component Value Date/Time   NA 139 11/26/2023 0825   K 4.4 11/26/2023 0825   CL 107 11/26/2023 0825   CO2 23 11/26/2023 0825   BUN 20 11/26/2023 0825   CREATININE 0.69 11/26/2023 0825   CREATININE 0.86 08/14/2023 0802      Component Value Date/Time   CALCIUM  9.1 11/26/2023 0825   ALKPHOS 66 11/26/2023 0825   AST 36 11/26/2023 0825   AST 36 08/14/2023 0802   ALT 38 11/26/2023 0825   ALT 58 (H) 08/14/2023 0802   BILITOT 1.0 11/26/2023 0825   BILITOT 0.5 08/14/2023 0802       Impression and Plan: Justin Rosales is a very nice 73 year old white male.  He has ulcerative colitis.  He had Clostridium diarrhea.  He became profoundly iron  deficient.    Currently, he is really doing nicely.  His blood counts look quite good.  I am glad that he is gone see the new Gastroenterologist.  And so that he may get put on one of the newer medications for inflammatory bowel disease.  As always, we will go ahead and plan to get him back in another 5 months or so.  We will get him through  the Summer. Aaron Aas  Ivor Mars, MD 6/6/20258:12 AM

## 2023-12-30 NOTE — Progress Notes (Addendum)
 [facility] Return Visit   Referring Provider Jobe Mulder, DO [address] [place],  [place] [address]  Primary Care Provider [place], Shellie Dials, DO  Patient Profile: Justin Rosales is a 73 y.o. male with a past medical history noteworthy for DVT, HTN, pulmonary nodules, anemia  who returns to the [facility] for follow-up of the problem(s) noted below.  Problem List: Pan ulcerative colitis diagnosed 2002 History of prior C. 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 Brice Campi   Current GI Meds  Infliximab  10 mg/kg every 4 weeks - TDM 4.2025  IFX 33, no Ab 6-mercaptopurine  100 mg p.o. twice daily  - TDM 10/2023  6-TGN 250  6-MMP 2943 Florastor  Interval History  Justin Rosales presents to the office today accompanied by his wife at the request of Dr. Brice Campi for his second opinion regarding management of his inflammatory bowel disease.  His IBD history is noteworthy for the following: -- UC diagnosed ~ 2002 tx'd with SAZ -- Hospitalized 2021 with C. Diff colitis (vancomycin , fidaxomicin )  c/b DVT -- Initiated IFX + 6-MP 2022 with dose optimization of IFX to 10 mg.kg Q4 weeks   While Justin Rosales has had improvement in his symptoms and inflammation on combination anti-TNF and thiopurine therapy, he has not been able to achieve endoscopic remission Fecal calprotectin remains elevated-last 490 and 04/2023 Colonoscopies have disclosed a persistent polypoid lesion at his splenic flexure that have not yielded dysplasia but have remained concerning for possible evolution of dysplasia Noteworthy that he has had 3 basal cell carcinomas including  some that have required Mohs surgery potentially related to being on combination/thiopurine therapy  From a symptom perspective he is currently having 2-4 mostly formed bowel movements a day No blood but he does see some mucus Minimal fecal urgency No nocturnal bowel movements Denies abdominal pain or cramping No upper GI tract symptoms of GERD, nausea, vomiting, dysphagia or odynophagia  With respect to extraintestinal manifestations, he reports a prior history of iritis that improved on anti-TNF therapy - no current eye symptoms Denies unexplained fevers, chills, joint pain, skin rashes or oral ulcers  He endorses ongoing symptoms of significant fatigue -feels that this may be related to ongoing inflammation Labs 12/2023: Normal hemoglobin and iron   Weight has been stable and notes that he has gained a significant amount of weight -focusing on healthy maintenance of weight  No known family history of colorectal cancer No known family history of lymphoma  During today's visit, Justin Rosales commented on whether or not he could have an alternate diagnosis of Crohn's disease In reviewing his chart, I find no definitive endoscopic or radiographic evidence of Crohn's disease One CT scan in 2021 showed possible jejunal thickening, however, this was in the setting of acute infectious illness. Rectal sparing noted on a previous colonoscopy was attributed to the use of rectal therapies Discussed that if further investigation required IBD serologies could be obtained  GI Review of Symptoms Significant for minimal fecal urgency. Otherwise negative.  General Review of Systems  Review of systems is significant for the pertinent positives and negatives as listed per the HPI.  Full ROS is otherwise negative.  Inflammatory Bowel Disease  History  UC diagnosed ~ 2002 tx'd with SAZ Hospitalized 2021 with C. Diff colitis (vancomycin , fidaxomicin )  c/b DVT Initiated IFX + 6-MP 2022 with dose optimization of  IFX to 10 mg/kg Q4 weeks   IBD Medication History Oral and rectal mesalamine Sulfasalazine  Hydrocortisone suppositories Prednisone  Azathioprine Infliximab   Past Medical History   Past Medical History:  Diagnosis Date   Allergy    Anemia    Arthritis    Blood transfusion without reported diagnosis    C. 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 Dr Andra Banco possibly With [facility] x2. last one done around 2012      Allergies and Medications   No Known Allergies @MEDSTODAY @  Family His   Family History  Problem Relation Age of Onset   Hypertension Mother    Diabetes Mother    Alzheimer's disease Mother    Arthritis Mother    Parkinson's disease Father    Alzheimer's disease Father    Dementia Sister    Colon cancer Neg Hx    Esophageal cancer Neg Hx    Rectal cancer Neg Hx    Stomach cancer Neg Hx    Social History   Social History   Tobacco Use   Smoking status: Former    Current packs/day: 0.00    Average packs/day: 1 pack/day for 30.0 years (30.0 ttl pk-yrs)    Types: Cigarettes    Start date: 54    Quit date: 2001    Years since quitting: 24.4   Smokeless tobacco: Never  Vaping Use   Vaping status: Never Used  Substance Use Topics   Alcohol use: Yes    Alcohol/week: 1.0 standard drink of alcohol    Types: 1 Cans of beer per week    Comment: ocassionally   Drug use: Not Currently   Thayer reports that he  quit smoking about 24 years ago. His smoking use included cigarettes. He started smoking about 54 years ago. He has a 30 pack-year smoking history. He has never used smokeless tobacco. He reports current alcohol use of about 1.0 standard drink of alcohol per week. He reports that he does not currently use drugs.  Vital Signs and Physical Examination   Vitals:   12/31/23 1322  BP: 136/80  Pulse: 96   Body mass index is 32.86 kg/m. Weight: 229 lb (103.9 kg)  General: Well developed, well nourished, no acute distress Head: Normocephalic and atraumatic, currently with a bandage over left forehead from recent skin cancer removal Eyes: Sclerae anicteric, EOMI Lungs: Clear throughout to auscultation Heart: Regular rate and rhythm; No murmurs, rubs or bruits Abdomen: Soft, non tender and non distended. No masses, hepatosplenomegaly or hernias noted. Normal Bowel sounds Rectal: Deferred Musculoskeletal: Symmetrical with no gross deformities    Review of Data   The following data was reviewed at the time of this encounter:   Laboratory Studies  Latest Ref Rng & Units 12/25/2023    7:48 AM 11/26/2023    8:25 AM 09/30/2023    8:30 AM  CBC  WBC 4.0 - 10.5 K/uL 4.7  3.8  3.7   Hemoglobin 13.0 - 17.0 g/dL 16.1  09.6  04.5   Hematocrit 39.0 - 52.0 % 38.4  41.7  39.6   Platelets 150 - 400 K/uL 226  198  250     Lab Results  Component Value Date   LIPASE 23 06/18/2020      Latest Ref Rng & Units 12/25/2023    7:48 AM 11/26/2023    8:25 AM 09/30/2023    8:30 AM  CMP  Glucose 70 - 99 mg/dL 409  811  914   BUN 8 - 23 mg/dL 17  20  23    Creatinine 0.61 - 1.24 mg/dL 7.82  9.56  2.13   Sodium 135 - 145 mmol/L 137  139  139   Potassium 3.5 - 5.1 mmol/L 4.2  4.4  3.9   Chloride 98 - 111 mmol/L 105  107  106   CO2 22 - 32 mmol/L 25  23  24    Calcium  8.9 - 10.3 mg/dL 9.4  9.1  9.3   Total Protein 6.5 - 8.1 g/dL 7.3  7.6  7.2   Total Bilirubin 0.0 - 1.2 mg/dL 0.7  1.0  0.7   Alkaline Phos  38 - 126 U/L 68  66  62   AST 15 - 41 U/L 27  36  22   ALT 0 - 44 U/L 33  38  32     CRP   Lab Results  Component Value Date   IRON  88 12/25/2023   TIBC 329 12/25/2023   FERRITIN 159 12/25/2023   IBD Labs  Prebiologic Labs   Therapeutic Drug Monitoring Thiopurine metabolite levels:    Biologic level and antibodies:  Infliximab  level and antibodies   Fecal Calprotectin    Imaging Studies  CTE 07/18/2022 Focal moderate to severe dilatation of the mid transverse colon with some dependent fluid and air. Just proximal this is a loop of proximal transverse colon with some nodular wall thickening and submucosal hyperenhancement. With the patient's history this could represent an area of colitis. Please correlate with the findings at endoscopy and correlate to exact location abnormality. More distally the colon is decompressed. And more proximally the small bowel and the very proximal colon is nondilated. No signs of obstruction.   No inflammatory stranding, free air or fluid at this time   Multifocal degenerative changes of the spine and pelvis. There is partial fusion of the sacroiliac joints.  CTAP 06/21/2020 1. Respiratory motion degrades image quality, limiting the evaluation of mid and lower lung zone segmental and subsegmental pulmonary arteries. Otherwise, no pulmonary embolus. 2. Nodular lesions in the right upper and right lower lobes, possibly infectious or inflammatory in etiology. Malignancy cannot be excluded. Non-contrast chest CT at 3-6 months is recommended. If the nodules are stable at time of repeat CT, then future CT at 18-24 months (from today's scan) is considered optional for low-risk patients, but is recommended for high-risk patients. This recommendation follows the consensus statement: Guidelines for Management of Incidental Pulmonary Nodules Detected on CT Images: From the Fleischner Society 2017; Radiology 2017; 284:228-243. 3. Mild  proximal jejunal wall thickening with wall thickening involving the distal transverse and proximal sigmoid colon, as well as pericolonic inflammatory haziness. Sigmoid colon may be involved as well. Findings are slightly  progressive and indicative of an infectious or inflammatory enterocolitis. 4.  Aortic atherosclerosis (ICD10-I70.0).  CTAP 06/18/2020 1. Left colon is diffusely distended and featureless with mild circumferential wall thickening starting the distal transverse colon through the splenic flexure down to the rectum mild associated pericolonic edema/inflammation. Imaging features are compatible with infectious/inflammatory colitis 2. Bilateral renal cysts. 3. Aortic Atherosclerosis (ICD10-I70.0).      GI Procedures and Studies  Colonoscopy 06/03/2023 - Hemorrhoids found on digital rectal exam.  - There was significant looping of the colon.  - Normal mucosa in the transverse colon, at the hepatic flexure, in the ascending colon and in the cecum.  - Rule out malignancy, polypoid lesion at the splenic flexure. Biopsied. Tattooed.  - Granularity in the recto-sigmoid colon, in the sigmoid colon and in the descending colon. Biopsied.  - Multiple 2 to 8 mm polyps at the recto-sigmoid colon, in the sigmoid colon and in the transverse colon, removed with a cold snare. Resected and retrieved. - Normal mucosa in the rectum. Biopsied.  Path:  1. Surgical [P], right colon bx :      -  COLONIC MUCOSA WITH MILD ARCHITECTURAL DISARRAY SUGGESTIVE OF A CHRONIC      INACTIVE COLITIS GIVEN THE HISTORY OF ULCERATIVE COLITIS.      -  NEGATIVE FOR ACTIVITY, GRANULOMAS, VIRAL CYTOPATHIC EFFECT AND DYSPLASIA.       2. Surgical [P], left colon bx :      -  COLONIC MUCOSA WITH ARCHITECTURAL DISARRAY CONSISTENT WITH A CHRONIC INACTIVE      COLITIS GIVEN THE HISTORY OF ULCERATIVE COLITIS.      -  NEGATIVE FOR ACTIVITY, GRANULOMAS, VIRAL CYTOPATHIC EFFECT AND DYSPLASIA.       3. Surgical [P],  colon, rectum bx :      -  COLONIC MUCOSA WITH ARCHITECTURAL DISARRAY AND PANETH CELL METAPLASIA      CONSISTENT WITH A CHRONIC INACTIVE COLITIS GIVEN THE HISTORY OF ULCERATIVE      COLITIS.      -  NEGATIVE FOR ACTIVITY, GRANULOMAS, VIRAL CYTOPATHIC EFFECT AND DYSPLASIA.       4. Surgical [P], colon, transverse, sigmoid, polyp (6) :      -  GRANULATION TISSUE/INFLAMMATORY POLYPS       5. Surgical [P], colon, splenic flexure bx :      -  FOCAL COLONIC MUCOSA WITH EXTENSIVE REACTIVE/REPARATIVE CHANGE IN THE      BACKGROUND OF LAMINA PROPRIA EDEMA, GRANULATION TISSUE AND FIBROPURULENT DEBRIS      CONSISTENT WITH ULCERATION.      -  AN IMMUNOHISTOCHEMICAL STAIN FOR CMV IS PENDING AND WILL BE REPORTED IN AN      ADDENDUM.  Colonoscopy 06/27/2022  - Hemorrhoids found on digital rectal exam.  - There was significant looping of the colon.  - Inflammatory bowel disease. Confluent inflammation was found from the rectum to the transverse colon. There is a tattoo in the distal TC/proximal DC (ulcer is not present but inflammation remains). This was moderate in severity, slightly improved compared to previous examinations. Biopsied.  - Eight, 2 to 6 mm polyps in the descending colon, in the transverse colon and in the ascending colon, removed with a cold snare. Resected and retrieved.  - Non-bleeding non-thrombosed internal hemorrhoids.   Path    Colonoscopy 05/22/2021 - Hemorrhoids found on digital rectal exam.  - The examined portion of the ileum was normal.  - Diverticulosis in the ascending colon.  - One 3 mm polyp in  the ascending colon, removed with a cold snare. Resected and retrieved.  - Normal mucosa in the proximal transverse colon, at the hepatic flexure, in the ascending colon and in the cecum. Biopsied.  - Scar at the hepatic flexure.  - A single (solitary) ulcer in the mid transverse colon and in the distal transverse colon. Biopsied. Tattooed.  - Diffuse moderate mucosal changes  were found in the mid transverse colon and in the distal transverse colon secondary to colitis.  - Colitis. Inflammation was found from the sigmoid colon to the splenic flexure. This was moderate in severity and severe, improved compared to previous examinations. Biopsied.  - Normal mucosa in the rectum. Biopsied.  - Non-bleeding non-thrombosed external and internal hemorrhoids.  Path:   EGD/Colonoscopy 07/26/2020  - Esophageal plaques were found, suspicious for candidiasis. Biopsied.  - Z-line regular, 43 cm from the incisors.  - Erythematous mucosa in the stomach. No other gross lesions in the stomach. Biopsied.  - No gross lesions in the duodenal bulb, in the first portion of the duodenum and in the second portion of the duodenum. Biopsied.  - Hemorrhoids found on digital rectal exam.  - Stool in the entire examined colon.  - Normal mucosa in the transverse colon, in the ascending colon and in the cecum. Biopsied.  - Severe (Mayo Score 3) ulcerative colitis, worsened since the last examination. Fluid aspiration for C. difficile performed and Biopsied.  - Non-bleeding non-thrombosed external and internal hemorrhoids.  Path:   Colonoscopy 07/18/2019 - One 3 mm polyp at the hepatic flexure, removed with a cold snare. Resected and retrieved.  - One 5 mm polyp in the sigmoid colon, removed with a cold snare. Resected and retrieved.  - Diverticulosis in the ascending colon. - The descending colon, transverse colon, ascending colon and cecum are normal. Biopsied.  - Features of previously diagnosed UC were noted in the rectum, rectosigmoid colon, and distal sigmoid colon. There was mildly active inflammation in the distal rectum (erythema, single ulcer), with inactive features of chronic inflammation in the remainder of the proximal rectum, rectosigmoid, and distal sigmoid colon, characterized by scarring and loss of vascularity. Biopsied.  - Non-bleeding internal  hemorrhoids.  Path:   Colonoscopy 06/30/2019 - Normal terminal ileum - Normal colon from cecum to hepatic flexure - Distal to hepatic flexure changes were seen consistent with ulcerative colitis in a continuous and circumferential fashion without skip to areas - Rectum appeared to be relatively spared probably due to use of hydrocortisone suppositories and mesalamine enema/suppositories Path not available  VCE 04/2007 Good prep, capsule root to cecum, minimal erythema in the duodenal bulb, otherwise normal small bowel without lesions  EGD/Colonoscopy 04/02/2007 - Possible mild Candida esophagitis - Nodular tissue inside the pylorus in the proximal duodenal bulb  - Mid sigmoid colon diverticulosis - Small internal hemorrhoids - In the distal 23 cm of the rectosigmoid colon some changes were seen consistent with mild colitis and proctitis with erythema, edema, granularity -there was some relative sparing of the distal rectum - Normal terminal ileum  Path not available  Clinical Impression  It is my clinical impression that Justin Rosales is a 73 y.o. male with;  Pan ulcerative colitis diagnosed 2002 History of prior C. 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  In 2021 he incurred a severe flare after contracting C. difficile infection resulting in inpatient hospitalization.  His course was further complicated by a DVT at that time.  After treatment of his C. difficile infection he was initiated on combination therapy with infliximab  and 6-MP in 2022.  Over the course of that year his infliximab  dosing was optimized to 10 mg/kg every 4 weeks due to  low serum infliximab  level.  Most recent therapeutic drug monitoring in April 2025 documented therapeutic anti-TNF and thiopurine levels.  While his degree of inflammation appears to have slowly improved on combination therapy, his disease has not achieved endoscopic remission.  Justin Rosales is also endorsing ongoing symptoms of fatigue that he attributes to active IBD and inflammation.  It is also noteworthy that he is developing multiple basal cell carcinomas which may be a sequelae of combination/thiopurine therapy.  Reviewed that statistically the highest risk of lymphoma in the setting of IBD is with the use of combination therapy and in males over 65.  In light of these factors, Justin Rosales is open to consideration of transitioning to alternate IBD therapies but has consternation regarding the possibility of a flare given how difficult it has been to achieve remission of his disease.  At today's visit we discussed performing a restaging/surveillance IBD colonoscopy with chromoendoscopy this summer to reassess extent and severity of his inflammation.  With use of chromoendoscopy will also assess for any dysplastic changes related to his splenic flexure polypoid lesion that would direct the course of his treatment plan towards a surgical avenue.  From a medical management perspective we reviewed that other medication options could include: Stelara, Skyrizi, Tremfya, Omvoh or Entyvio.  Current AGA guidelines and data provides the strongest evidence for the use of Stelara after transitioning from anti-TNF and biologic experience patients with ulcerative colitis.  Skyrizi, Tremfya and Omvoh also have utility but potentially rank ordered after the use of Stelara.  Justin Rosales reports that he tends to be steroid responsive which is favorable if he transitions to another therapy and requires a corticosteroid bridge while awaiting other forms of Biologics to take effect.  I agree with Dr. Brice Campi that avoidance of Jak inhibitors  is appropriate given previous history of DVT.    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  It is currently responding well to anti-TNF therapy.  If medication changes are entertained in the future after his pending colonoscopy, it would be appropriate to engage in a multidisciplinary discussion with his ophthalmologist regarding the efficacy of alternate Biologics in the setting of iritis.  We briefly discussed options of colectomy for management of ulcerative colitis.  Justin Rosales is not inclined to consider elective surgery at this time with the mindset that he would like to trial other Biologics which is certainly reasonable given that he is only utilized 1 advanced therapy.  Plan  Schedule IBD restaging and surveillance colonoscopy with chromoendoscopy at hospital-based unit In the interim, continue infliximab  10 mg/kg every 4 weeks in conjunction with 6-MP 100 mg daily Continue quarterly immune suppressant laboratory monitoring Continue periodic disease activity assessments with fecal calprotectin Continue probiotic; limit the use of antibiotics in the setting of previous C. difficile Monitor weight and anthropometrics  IBD Health Maintenance  Vaccinations Influenza: PCV13: PPSV23: COVID19: HAV/HBV: Shingles: HPV:  DEXA PRN if requires further steroids  Eye Exam Follows with ophthalmology for iritis  Skin Exam Follows with dermatology due to [facility]  Surveillance Colonoscopy Scheduling IBD surveillance colonoscopy with chromoendoscopy summer 2025  Tobacco Use Remote use none current  Depression Screen    Planned Follow Up Justin Rosales return to Dr. Marolyn Sis care -I will update him after IBD surveillance colonoscopy with chromoendoscopy is completed and can discuss further decision making  The patient or caregiver verbalized understanding of the material covered, with no barriers to understanding. All questions were answered. Patient or  caregiver is agreeable with the plan outlined above.    It was a pleasure to see Justin Rosales.  If you have any questions or concerns regarding this evaluation, do not hesitate to contact me.  Eugenia Hess, MD [facility]

## 2023-12-31 ENCOUNTER — Encounter: Payer: Self-pay | Admitting: Pediatrics

## 2023-12-31 ENCOUNTER — Ambulatory Visit: Admitting: Pediatrics

## 2023-12-31 VITALS — BP 136/80 | HR 96 | Ht 70.0 in | Wt 229.0 lb

## 2023-12-31 DIAGNOSIS — C4490 Unspecified malignant neoplasm of skin, unspecified: Secondary | ICD-10-CM

## 2023-12-31 DIAGNOSIS — K51019 Ulcerative (chronic) pancolitis with unspecified complications: Secondary | ICD-10-CM

## 2023-12-31 DIAGNOSIS — Z9225 Personal history of immunosupression therapy: Secondary | ICD-10-CM | POA: Diagnosis not present

## 2023-12-31 DIAGNOSIS — Z86718 Personal history of other venous thrombosis and embolism: Secondary | ICD-10-CM | POA: Diagnosis not present

## 2023-12-31 DIAGNOSIS — R5383 Other fatigue: Secondary | ICD-10-CM

## 2023-12-31 DIAGNOSIS — H209 Unspecified iridocyclitis: Secondary | ICD-10-CM

## 2023-12-31 NOTE — Patient Instructions (Signed)
 We will contact you to schedule your procedure.  Thank you for entrusting me with your care and for choosing [facility], Dr. Eugenia Hess   _______________________________________________________  If your blood pressure at your visit was 140/90 or greater, please contact your primary care physician to follow up on this.  _______________________________________________________  If you are age 73 or older, your body mass index should be between 23-30. Your Body mass index is 32.86 kg/m. If this is out of the aforementioned range listed, please consider follow up with your Primary Care Provider.  If you are age 17 or younger, your body mass index should be between 19-25. Your Body mass index is 32.86 kg/m. If this is out of the aformentioned range listed, please consider follow up with your Primary Care Provider.   ________________________________________________________  The [facility] providers would like to encourage you to use MYCHART to communicate with providers for non-urgent requests or questions.  Due to long hold times on the telephone, sending your provider a message by [facility] may be a faster and more efficient way to get a response.  Please allow 48 business hours for a response.  Please remember that this is for non-urgent requests.  _______________________________________________________

## 2024-01-01 ENCOUNTER — Telehealth: Payer: Self-pay

## 2024-01-01 NOTE — Telephone Encounter (Signed)
-----   Message from Truddie Furrow sent at 01/01/2024  8:18 AM EDT ----- Regarding: Scheduling Colonoscopy with chromoendoscopy at hospital [facility] -  I saw Mr. Fleck in the office yesterday and we discussed scheduling a colonoscopy with chromoendoscopy (dye technique to highlight precancerous lesions).  I have been in contact with Demetrio Finder at [facility] and they will be able to have me perform the case there in the future with the supplies that I need.  Please schedule a future colonoscopy with chromoendoscopy at [facility] -the case needs to be booked for 60 minutes (not the typical 30-minute colonoscopy).  The patient was aware of the future procedure when I saw him in clinic yesterday and the clinic staff gave him bowel prep instructions so he should have that information.  I think it would be fine to schedule the procedure anytime this summer or early fall that the patient is available.  Thanks,  Haskell Linker

## 2024-01-05 ENCOUNTER — Other Ambulatory Visit: Payer: Self-pay

## 2024-01-05 ENCOUNTER — Ambulatory Visit: Admitting: Family Medicine

## 2024-01-05 DIAGNOSIS — K51019 Ulcerative (chronic) pancolitis with unspecified complications: Secondary | ICD-10-CM

## 2024-01-05 DIAGNOSIS — K529 Noninfective gastroenteritis and colitis, unspecified: Secondary | ICD-10-CM

## 2024-01-05 DIAGNOSIS — Z9225 Personal history of immunosupression therapy: Secondary | ICD-10-CM

## 2024-01-05 NOTE — Telephone Encounter (Signed)
 Called and spoke with patient to scheduled hospital colonoscopy with chromoendoscopy. Patient has been scheduled for a telephone PV on Monday, 02/22/24 at 12:30 pm, he knows to expect a call from the nurse to review history, prep, and instructions. Colonoscopy with chromoendoscopy has been scheduled on Monday, 03/07/24 at 9:30 am. Patient is aware that procedure is at [facility] with Dr. Yvone Herd. Patient is aware that he will need to arrive by 8 am with a care partner. I advised patient that his colonoscopy will be an hour vs typical 30 minutes due to the dye technique that Dr. Yvone Herd will be performing. Patient verbalized understanding of all information and had no concerns at the end of the call.   I spoke with Demetrio Finder, she is aware of the date of procedure so that they will have chromoendoscopy. Will email her as well.

## 2024-01-06 ENCOUNTER — Ambulatory Visit

## 2024-01-07 ENCOUNTER — Ambulatory Visit

## 2024-01-20 ENCOUNTER — Other Ambulatory Visit: Payer: Self-pay | Admitting: Gastroenterology

## 2024-01-21 ENCOUNTER — Ambulatory Visit (HOSPITAL_COMMUNITY)
Admission: RE | Admit: 2024-01-21 | Discharge: 2024-01-21 | Disposition: A | Discharge status: Home / Self Care | Source: Ambulatory Visit | Attending: Internal Medicine | Admitting: Internal Medicine

## 2024-01-21 DIAGNOSIS — K51019 Ulcerative (chronic) pancolitis with unspecified complications: Secondary | ICD-10-CM | POA: Insufficient documentation

## 2024-01-21 LAB — CBC WITH DIFFERENTIAL/PLATELET
Abs Immature Granulocytes: 0.01 10*3/uL (ref 0.00–0.07)
Basophils Absolute: 0 10*3/uL (ref 0.0–0.1)
Basophils Relative: 1 %
Eosinophils Absolute: 0.3 10*3/uL (ref 0.0–0.5)
Eosinophils Relative: 7 %
HCT: 42.5 % (ref 39.0–52.0)
Hemoglobin: 13.7 g/dL (ref 13.0–17.0)
Immature Granulocytes: 0 %
Lymphocytes Relative: 42 %
Lymphs Abs: 1.8 10*3/uL (ref 0.7–4.0)
MCH: 31.4 pg (ref 26.0–34.0)
MCHC: 32.2 g/dL (ref 30.0–36.0)
MCV: 97.5 fL (ref 80.0–100.0)
Monocytes Absolute: 0.5 10*3/uL (ref 0.1–1.0)
Monocytes Relative: 11 %
Neutro Abs: 1.7 10*3/uL (ref 1.7–7.7)
Neutrophils Relative %: 39 %
Platelets: 225 10*3/uL (ref 150–400)
RBC: 4.36 MIL/uL (ref 4.22–5.81)
RDW: 15 % (ref 11.5–15.5)
WBC: 4.4 10*3/uL (ref 4.0–10.5)
nRBC: 0 % (ref 0.0–0.2)

## 2024-01-21 LAB — BASIC METABOLIC PANEL WITH GFR
Anion gap: 9 (ref 5–15)
BUN: 12 mg/dL (ref 8–23)
CO2: 24 mmol/L (ref 22–32)
Calcium: 9.2 mg/dL (ref 8.9–10.3)
Chloride: 105 mmol/L (ref 98–111)
Creatinine, Ser: 0.73 mg/dL (ref 0.61–1.24)
GFR, Estimated: >60 mL/min (ref >60)
Glucose, Bld: 105 mg/dL — ABNORMAL HIGH (ref 70–99)
Potassium: 4.2 mmol/L (ref 3.5–5.1)
Sodium: 138 mmol/L (ref 135–145)

## 2024-01-21 LAB — SEDIMENTATION RATE: Sed Rate: 12 mm/h (ref 0–16)

## 2024-01-21 LAB — C-REACTIVE PROTEIN: CRP: <0.5 mg/dL (ref <1.0)

## 2024-01-21 MED ORDER — SODIUM CHLORIDE 0.9 % IV SOLN
1000.0000 mg | INTRAVENOUS | Status: DC
  Administered 2024-01-21: 1000 mg via INTRAVENOUS
  Filled 2024-01-21: qty 100

## 2024-01-21 MED ORDER — SODIUM CHLORIDE 0.9 % IV SOLN: INTRAVENOUS | Status: DC | PRN

## 2024-01-21 NOTE — Progress Notes (Signed)
 PATIENT CARE CENTER NOTE     Diagnosis: Ulcerative pancolitis with complication [facility]) [K51.019]       Provider: Aloha Finner MD     Procedure: Remicade  10mg /kg infusion and lab work     Note: Patient received Remicade  infusion (dose # 3 of 6) via PIV. No premeds required per orders.  Labs drawn (CBC w/diff, CMP, CRP, Sed Rate) prior to infusion and will be due again in 2 months. Infusion titrated per protocol. Patient tolerated well with no adverse reaction.  Vital signs stable. Patient declined AVS and has already scheduled next appointment. Patient alert, oriented and ambulatory at discharge.

## 2024-01-28 ENCOUNTER — Ambulatory Visit: Payer: Self-pay | Admitting: Gastroenterology

## 2024-02-18 ENCOUNTER — Non-Acute Institutional Stay (HOSPITAL_COMMUNITY)
Admission: RE | Admit: 2024-02-18 | Discharge: 2024-02-18 | Disposition: A | Discharge status: Home / Self Care | Source: Ambulatory Visit | Attending: Internal Medicine | Admitting: Internal Medicine

## 2024-02-18 DIAGNOSIS — K51019 Ulcerative (chronic) pancolitis with unspecified complications: Secondary | ICD-10-CM | POA: Diagnosis present

## 2024-02-18 MED ORDER — SODIUM CHLORIDE 0.9 % IV SOLN: INTRAVENOUS | Status: DC | PRN

## 2024-02-18 MED ORDER — SODIUM CHLORIDE 0.9 % IV SOLN
1000.0000 mg | INTRAVENOUS | Status: DC
  Administered 2024-02-18: 1000 mg via INTRAVENOUS
  Filled 2024-02-18: qty 100

## 2024-02-18 NOTE — Progress Notes (Signed)
 PATIENT CARE CENTER NOTE     Diagnosis: Ulcerative pancolitis with complication [facility]) [K51.019]       Provider: Aloha Finner MD     Procedure: Remicade  10mg /kg infusion and lab work     Note: Patient received Remicade  infusion (dose # 4 of 6) via PIV. No premeds required per orders.  Labs drawn with last infusion so will not be due until next month. Infusion titrated per protocol. Patient tolerated well with no adverse reaction.  Vital signs stable. Patient declined AVS and has already scheduled next appointment. Patient alert, oriented and ambulatory at discharge.

## 2024-02-22 ENCOUNTER — Ambulatory Visit (AMBULATORY_SURGERY_CENTER)

## 2024-02-22 ENCOUNTER — Ambulatory Visit: Admitting: Family Medicine

## 2024-02-22 VITALS — Ht 70.0 in | Wt 230.0 lb

## 2024-02-22 DIAGNOSIS — K51011 Ulcerative (chronic) pancolitis with rectal bleeding: Secondary | ICD-10-CM

## 2024-02-22 MED ORDER — NA SULFATE-K SULFATE-MG SULF 17.5-3.13-1.6 GM/177ML PO SOLN: 1.0000 | Freq: Once | ORAL | 0 refills | Status: AC

## 2024-02-22 NOTE — Progress Notes (Signed)

## 2024-02-23 ENCOUNTER — Ambulatory Visit: Admitting: Family Medicine

## 2024-02-23 ENCOUNTER — Encounter: Payer: Self-pay | Admitting: Family Medicine

## 2024-02-23 VITALS — BP 128/70 | HR 81 | Temp 98.0°F | Resp 16 | Ht 70.0 in | Wt 230.0 lb

## 2024-02-23 DIAGNOSIS — I10 Essential (primary) hypertension: Secondary | ICD-10-CM | POA: Diagnosis not present

## 2024-02-23 DIAGNOSIS — I700 Atherosclerosis of aorta: Secondary | ICD-10-CM

## 2024-02-23 MED ORDER — SILDENAFIL CITRATE 100 MG PO TABS: 100.0000 mg | ORAL_TABLET | Freq: Every day | ORAL | 2 refills | Status: AC | PRN

## 2024-02-23 NOTE — Progress Notes (Signed)
 CC: F/u   Subjective Justin Rosales is a 73 y.o. male who presents for hypertension follow up. He does monitor home blood pressures. Blood pressures ranging from 140's/70's on average. He is compliant with medications- Exforge  5-160 mg/d. Patient has these side effects of medication: none He is usually adhering to a healthy diet overall. Current exercise: none No CP or SOB.   Hyperlipidemia Patient presents for hyperlipidemia follow up. Currently being treated with Crestor  20 mg/d and compliance with treatment thus far has been good. He denies myalgias. Diet/exercise as above.  The patient is not known to have coexisting coronary artery disease.   Past Medical History:  Diagnosis Date   Allergy    Anemia    Arthritis    Blood transfusion without reported diagnosis    C. 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   Goals of care, counseling/discussion 08/28/2020   Hypertension    Iron  deficiency anemia due to chronic blood loss 08/28/2020   Iron  malabsorption 08/28/2020   Lower leg DVT (deep venous thromboembolism), acute, right (HCC) 09/28/2020   Melanoma (HCC)    UC (ulcerative colitis) (HCC)     Exam BP 128/70 (BP Location: Right Leg, Patient Position: Sitting)   Pulse 81   Temp 98 F (36.7 C) (Oral)   Resp 16   Ht 5' 10 (1.778 m)   Wt 230 lb (104.3 kg)   SpO2 95%   BMI 33.00 kg/m  General:  well developed, well nourished, in no apparent distress Heart: RRR, no bruits, no LE edema Lungs: clear to auscultation, no accessory muscle use Psych: well oriented with normal range of affect and appropriate judgment/insight  Essential hypertension  Aortic atherosclerosis (HCC)  Chronic, tentatively stable.  Holding several of the elevated, he will keep an eye on things and improve his physical activity.  Send a message in a month if readings are still elevated.  Counseled  on diet and exercise. Chronic, stable.  Continue Crestor  20 mg daily. F/u in 6 mo. The patient voiced understanding and agreement to the plan.  Mabel [place], DO 02/23/24  11:52 AM

## 2024-02-23 NOTE — Patient Instructions (Signed)
 Send me a message in 1 month with some blood pressure readings.   Keep the diet clean and stay active.  Aim to do some physical exertion for 150 minutes per week. This is typically divided into 5 days per week, 30 minutes per day. The activity should be enough to get your heart rate up. Anything is better than nothing if you have time constraints.  Check your blood pressures 2-3 times per week, alternating the time of day you check it. If it is high, considering waiting 1-2 minutes and rechecking. If it gets higher, your anxiety is likely creeping up and we should avoid rechecking.   Let us  know if you need anything.

## 2024-02-24 ENCOUNTER — Other Ambulatory Visit: Payer: Self-pay | Admitting: Family Medicine

## 2024-02-24 DIAGNOSIS — I10 Essential (primary) hypertension: Secondary | ICD-10-CM

## 2024-02-24 DIAGNOSIS — I700 Atherosclerosis of aorta: Secondary | ICD-10-CM

## 2024-02-26 ENCOUNTER — Encounter (HOSPITAL_COMMUNITY): Payer: Self-pay | Admitting: Pediatrics

## 2024-02-29 ENCOUNTER — Telehealth: Payer: Self-pay | Admitting: Gastroenterology

## 2024-02-29 NOTE — Telephone Encounter (Signed)
 Procedure:Colonoscopy Procedure date: 03/07/24 Procedure location: [facility] Arrival Time: 8:00 am Spoke with the patient Y/N: Yes Any prep concerns? No  Has the patient obtained the prep from the pharmacy ? Yes Do you have a care partner and transportation: Yes Any additional concerns? No

## 2024-03-07 ENCOUNTER — Ambulatory Visit (HOSPITAL_COMMUNITY)

## 2024-03-07 ENCOUNTER — Encounter (HOSPITAL_COMMUNITY)
Admission: RE | Disposition: A | Discharge status: Home / Self Care | Payer: Self-pay | Source: Home / Self Care | Attending: Pediatrics

## 2024-03-07 ENCOUNTER — Ambulatory Visit (HOSPITAL_COMMUNITY)
Admission: RE | Admit: 2024-03-07 | Discharge: 2024-03-07 | Disposition: A | Discharge status: Home / Self Care | Attending: Pediatrics | Admitting: Pediatrics

## 2024-03-07 ENCOUNTER — Other Ambulatory Visit: Payer: Self-pay

## 2024-03-07 ENCOUNTER — Ambulatory Visit (HOSPITAL_BASED_OUTPATIENT_CLINIC_OR_DEPARTMENT_OTHER)

## 2024-03-07 DIAGNOSIS — D125 Benign neoplasm of sigmoid colon: Secondary | ICD-10-CM | POA: Diagnosis not present

## 2024-03-07 DIAGNOSIS — M1990 Unspecified osteoarthritis, unspecified site: Secondary | ICD-10-CM | POA: Diagnosis not present

## 2024-03-07 DIAGNOSIS — K5140 Inflammatory polyps of colon without complications: Secondary | ICD-10-CM | POA: Diagnosis not present

## 2024-03-07 DIAGNOSIS — K529 Noninfective gastroenteritis and colitis, unspecified: Secondary | ICD-10-CM

## 2024-03-07 DIAGNOSIS — I10 Essential (primary) hypertension: Secondary | ICD-10-CM

## 2024-03-07 DIAGNOSIS — K51019 Ulcerative (chronic) pancolitis with unspecified complications: Secondary | ICD-10-CM

## 2024-03-07 DIAGNOSIS — D124 Benign neoplasm of descending colon: Secondary | ICD-10-CM

## 2024-03-07 DIAGNOSIS — Z87891 Personal history of nicotine dependence: Secondary | ICD-10-CM | POA: Diagnosis not present

## 2024-03-07 DIAGNOSIS — K5100 Ulcerative (chronic) pancolitis without complications: Secondary | ICD-10-CM | POA: Insufficient documentation

## 2024-03-07 DIAGNOSIS — Z86718 Personal history of other venous thrombosis and embolism: Secondary | ICD-10-CM | POA: Diagnosis not present

## 2024-03-07 DIAGNOSIS — Z1211 Encounter for screening for malignant neoplasm of colon: Secondary | ICD-10-CM

## 2024-03-07 DIAGNOSIS — Z8261 Family history of arthritis: Secondary | ICD-10-CM | POA: Insufficient documentation

## 2024-03-07 DIAGNOSIS — K635 Polyp of colon: Secondary | ICD-10-CM

## 2024-03-07 DIAGNOSIS — Z8249 Family history of ischemic heart disease and other diseases of the circulatory system: Secondary | ICD-10-CM | POA: Diagnosis not present

## 2024-03-07 DIAGNOSIS — Z8711 Personal history of peptic ulcer disease: Secondary | ICD-10-CM | POA: Diagnosis not present

## 2024-03-07 DIAGNOSIS — Z9225 Personal history of immunosupression therapy: Secondary | ICD-10-CM

## 2024-03-07 DIAGNOSIS — K633 Ulcer of intestine: Secondary | ICD-10-CM | POA: Diagnosis not present

## 2024-03-07 SURGERY — COLONOSCOPY
Anesthesia: Monitor Anesthesia Care

## 2024-03-07 MED ORDER — SODIUM CHLORIDE 0.9 % IV SOLN: INTRAVENOUS | Status: DC

## 2024-03-07 MED ORDER — PROPOFOL 10 MG/ML IV BOLUS
INTRAVENOUS | Status: DC | PRN
  Administered 2024-03-07 (×2): 20 mg via INTRAVENOUS

## 2024-03-07 MED ORDER — METHYLENE BLUE (ANTIDOTE) 1 % IV SOLN
INTRAVENOUS | Status: AC
Start: 2024-03-07 — End: 2024-03-07
  Filled 2024-03-07: qty 20

## 2024-03-07 MED ORDER — STERILE WATER FOR IRRIGATION IR SOLN
Status: DC | PRN
  Administered 2024-03-07: 120 mL

## 2024-03-07 MED ORDER — LIDOCAINE 2% (20 MG/ML) 5 ML SYRINGE
INTRAMUSCULAR | Status: DC | PRN
  Administered 2024-03-07: 40 mg via INTRAVENOUS

## 2024-03-07 MED ORDER — METHYLENE BLUE (ANTIDOTE) 1 % IV SOLN
INTRAVENOUS | Status: DC | PRN
  Administered 2024-03-07: 10 mL via SUBMUCOSAL

## 2024-03-07 MED ORDER — PROPOFOL 500 MG/50ML IV EMUL
INTRAVENOUS | Status: DC | PRN
Start: 2024-03-07 — End: 2024-03-07
  Administered 2024-03-07: 180 ug/kg/min via INTRAVENOUS

## 2024-03-07 MED ORDER — PHENYLEPHRINE 80 MCG/ML (10ML) SYRINGE FOR IV PUSH (FOR BLOOD PRESSURE SUPPORT)
PREFILLED_SYRINGE | INTRAVENOUS | Status: DC | PRN
  Administered 2024-03-07 (×8): 80 ug via INTRAVENOUS

## 2024-03-07 NOTE — Op Note (Signed)
 [facility] Patient Name: Justin Rosales Procedure Date: 03/07/2024 MRN: [id number] Attending MD: Inocente Hausen , MD, [id number] Date of Birth: April 20, 1951 CSN: [id number] Age: 73 Admit Type: Outpatient Procedure:                Colonoscopy Indications:              High risk colon cancer surveillance: Ulcerative                            pancolitis of 8 (or more) years duration, Last                            colonoscopy: November 2024, Incidental - Diarrhea,                            Patient is currently on infliximab  10 mg/kg every 4                            weeks and 6-MP 100 mg orally daily. Previous                            colonoscopies in 2023 and 2024 showed abnormal                            mucosa at the splenic flexure visually concerning                            for dysplasia versus scarring without dysplasia on                            biopsies. Providers:                Inocente Hausen, MD, Collene Edu, RN, Curtistine Bishop,                            Technician Referring MD:              Medicines:                Monitored Anesthesia Care Complications:            No immediate complications. Estimated blood loss:                            Minimal. Estimated Blood Loss:     Estimated blood loss was minimal. Procedure:                Pre-Anesthesia Assessment:                           - Prior to the procedure, a History and Physical                            was performed, and patient medications and                            allergies were reviewed. The patient's tolerance  of                            previous anesthesia was also reviewed. The risks                            and benefits of the procedure and the sedation                            options and risks were discussed with the patient.                            All questions were answered, and informed consent                            was obtained. Prior Anticoagulants: The  patient has                            taken no anticoagulant or antiplatelet agents. ASA                            Grade Assessment: III - A patient with severe                            systemic disease. After reviewing the risks and                            benefits, the patient was deemed in satisfactory                            condition to undergo the procedure.                           After obtaining informed consent, the colonoscope                            was passed under direct vision. Throughout the                            procedure, the patient's blood pressure, pulse, and                            oxygen saturations were monitored continuously. The                            CF-HQ190L ([id number]) Olympus colonoscope was                            introduced through the anus and advanced to the                            terminal ileum. The colonoscopy was performed  without difficulty. The patient tolerated the                            procedure well. The quality of the bowel                            preparation was good. The terminal ileum, ileocecal                            valve, appendiceal orifice, and rectum were                            photographed. Scope In: 9:51:02 AM Scope Out: 10:42:38 AM Scope Withdrawal Time: 0 hours 47 minutes 20 seconds  Total Procedure Duration: 0 hours 51 minutes 36 seconds  Findings:      The perianal and digital rectal examinations were normal. Pertinent       negatives include normal sphincter tone and no palpable rectal lesions.      Inflammation was not found based on the endoscopic appearance of the       mucosa in the cecum, ascending colon, transverse colon and proximal       descending colon. This was graded as Mayo Score 0 (normal or inactive       disease). There was extensive scarring and scattered pseudopolyps       predominantly throughout the left colon extending to the splenic        flexure. The mucosa of the rectosigmoid, descending colon and distal       transverse colon demonstrated pallor, loss of haustral markings and loss       of vascularity. The tissue in these regions showed minimal Mayo 1       inflammation. Some of the mucosal changes seen may be due more to       scarring than active inflammation. A few scattered pseudopolyps were       also seen in the ascending and transverse colon.      Three sessile polyps were found in the descending colon. The polyps were       5 to 6 mm in size. These polyps were removed with a cold snare.       Resection and retrieval were complete.      The region of the splenic flexure showed abnormal raised, nodular tissue       similar to prior appearance on colonoscopy in 2024. There was associated       webbing and scarring in the distal transverse colon.      Chromoendoscopy with methylene blue  was performed throughout the colon.       The borders of the splenic flexure region highlighted the raised,       nodular tissue. No other areas of abnormal tissue were identified.       Extensive biopsies were taken with cold forceps at the splenic flexure       for histology.      Three sessile polyps were found in the descending colon. The polyps were       5 to 6 mm in size. These polyps were removed with a cold snare.       Resection and retrieval were complete.      Four biopsies were taken every 10 cm with a cold forceps  from the       ascending colon, transverse colon, descending colon, sigmoid colon and       rectum for ulcerative colitis surveillance. These biopsy specimens were       sent to Pathology.      The retroflexed view of the distal rectum and anal verge was normal and       showed no anal or rectal abnormalities. Impression:               - Inactive (Mayo Score 0) ulcerative colitis in the                            cecum, ascending colon, transverse colon and                            proximal descending  colon.                           - Mild Mayo 1 ulcerative colitis in the                            rectosigmoid and distal descending colon. There was                            extensive scarring in the left colon. Mucosal                            abnormalities seen may be more reflective of                            scarring than true active inflammation.                           - Unchanged appearance of splenic flexure lesion                            compared to colonoscopy in 2024. Chromoendoscopy                            was performed to highlight edges of the lesion with                            extensive biopsies performed. No other abnormal                            raised lesions seen under chromoendoscopy                            throughout the colon.                           - Three 5 to 6 mm polyps in the descending colon,                            removed with a cold snare. Resected and retrieved.                           -  The distal rectum and anal verge are normal on                            retroflexion view.                           - Biopsies for surveillance were taken from the                            ascending colon, transverse colon, descending                            colon, sigmoid colon and rectum. Moderate Sedation:      Not Applicable - Patient had care per Anesthesia. Recommendation:           - Discharge patient to home (ambulatory).                           - Await pathology results.                           - The findings and recommendations were discussed                            with the patient's family.                           - Patient has a contact number available for                            emergencies. The signs and symptoms of potential                            delayed complications were discussed with the                            patient. Return to normal activities tomorrow.                             Written discharge instructions were provided to the                            patient. Procedure Code(s):        --- Professional ---                           [phone], Colonoscopy, flexible; with removal of                            tumor(s), polyp(s), or other lesion(s) by snare                            technique                           [id number], 59, Colonoscopy, flexible; with biopsy,  single or multiple Diagnosis Code(s):        --- Professional ---                           K51.00, Ulcerative (chronic) pancolitis without                            complications CPT copyright 2022 [employer]. All rights reserved. The codes documented in this report are preliminary and upon coder review may  be revised to meet current compliance requirements. Inocente Hausen, MD 03/07/2024 11:06:56 AM This report has been signed electronically. Number of Addenda: 0

## 2024-03-07 NOTE — Anesthesia Postprocedure Evaluation (Signed)
 Anesthesia Post Note  Patient: Justin Rosales  Procedure(s) Performed: COLONOSCOPY POLYPECTOMY, INTESTINE     Patient location during evaluation: PACU Anesthesia Type: MAC Level of consciousness: awake and alert Pain management: pain level controlled Vital Signs Assessment: post-procedure vital signs reviewed and stable Respiratory status: spontaneous breathing, nonlabored ventilation, respiratory function stable and patient connected to nasal cannula oxygen Cardiovascular status: stable and blood pressure returned to baseline Postop Assessment: no apparent nausea or vomiting Anesthetic complications: no   No notable events documented.  Last Vitals:  Vitals:   03/07/24 1115 03/07/24 1120  BP:  (!) 153/70  Pulse: 66 66  Resp: (!) 22 18  Temp:    SpO2: 96% 96%    Last Pain:  Vitals:   03/07/24 1120  TempSrc:   PainSc: 0-No pain                 Thom JONELLE Peoples

## 2024-03-07 NOTE — Transfer of Care (Signed)
 Immediate Anesthesia Transfer of Care Note  Patient: Justin Rosales  Procedure(s) Performed: COLONOSCOPY POLYPECTOMY, INTESTINE  Patient Location: Endoscopy Unit  Anesthesia Type:MAC  Level of Consciousness: sedated  Airway & Oxygen Therapy: Patient Spontanous Breathing and Patient connected to face mask oxygen  Post-op Assessment: Report given to RN and Post -op Vital signs reviewed and stable  Post vital signs: Reviewed and stable  Last Vitals:  Vitals Value Taken Time  BP 87/56 03/07/24 10:50  Temp    Pulse 60 03/07/24 10:51  Resp 22 03/07/24 10:51  SpO2 99 % 03/07/24 10:51  Vitals shown include unfiled device data.  Last Pain:  Vitals:   03/07/24 0848  TempSrc: Temporal  PainSc: 0-No pain         Complications: No notable events documented.

## 2024-03-07 NOTE — Discharge Instructions (Signed)

## 2024-03-07 NOTE — Anesthesia Preprocedure Evaluation (Addendum)
 Anesthesia Evaluation  Patient identified by MRN, date of birth, ID band Patient awake    Reviewed: Allergy & Precautions, H&P , NPO status , Patient's Chart, lab work & pertinent test results  History of Anesthesia Complications Negative for: history of anesthetic complications  Airway Mallampati: II  TM Distance: >3 FB Neck ROM: Full    Dental no notable dental hx.    Pulmonary former smoker   Pulmonary exam normal breath sounds clear to auscultation       Cardiovascular hypertension, (-) Past MI Normal cardiovascular exam Rhythm:Regular Rate:Normal  Hx of DVT   Neuro/Psych neg Seizures negative neurological ROS  negative psych ROS   GI/Hepatic Neg liver ROS, PUD,,,ulcerative pancolitis   Endo/Other  negative endocrine ROS    Renal/GU negative Renal ROS  negative genitourinary   Musculoskeletal  (+) Arthritis ,    Abdominal   Peds negative pediatric ROS (+)  Hematology negative hematology ROS (+)   Anesthesia Other Findings   Reproductive/Obstetrics negative OB ROS                              Anesthesia Physical Anesthesia Plan  ASA: 3  Anesthesia Plan: MAC   Post-op Pain Management:    Induction: Intravenous  PONV Risk Score and Plan: 1 and Propofol  infusion and Treatment may vary due to age or medical condition  Airway Management Planned: Natural Airway  Additional Equipment:   Intra-op Plan:   Post-operative Plan:   Informed Consent: I have reviewed the patients History and Physical, chart, labs and discussed the procedure including the risks, benefits and alternatives for the proposed anesthesia with the patient or authorized representative who has indicated his/her understanding and acceptance.     Dental advisory given  Plan Discussed with: CRNA  Anesthesia Plan Comments:          Anesthesia Quick Evaluation

## 2024-03-07 NOTE — H&P (Signed)
 [name]   Primary Care Physician:  Frann Mabel Mt, DO   Reason for Procedure:  Restage ulcerative colitis disease activity and perform surveillance to rule out dysplasia  Plan:    Colonoscopy with chromoendoscopy     HPI: Justin Rosales is a 73 y.o. male undergoing colonoscopy with chromoendoscopy for restaging of ulcerative colitis disease activity and to rule out dysplastic lesions.  Patient has a history of pan ulcerative colitis diagnosed in 2002.  After severe flare 2 to 3 years ago was transitioned to infliximab  10 mg/kg every 4 weeks in conjunction with 6-MP 100 mg daily.  Patient's symptoms are improved but not optimally controlled.  Previous colonoscopies in 2023 and 2024 have demonstrated abnormal tissue at the splenic flexure concerning for early dysplasia but biopsies have been negative.  Chromoendoscopy will be performed today to reassess this lesion.   Past Medical History:  Diagnosis Date   Allergy    Anemia    Arthritis    Blood transfusion without reported diagnosis    C. 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 Dr Timm possibly With [facility] x2. last one done around 2012     Prior to Admission medications   Medication Sig Start Date End Date Taking? Authorizing Provider  amLODipine -valsartan  (EXFORGE ) 5-160 MG tablet TAKE 1 TABLET BY MOUTH DAILY 02/25/24  Yes Wendling, Mabel Mt, DO  Cholecalciferol  (DIALYVITE VITAMIN D 5000) 125 MCG (5000 UT) capsule Take 5,000 Units by mouth daily.   Yes [provider]  folic acid  (FOLVITE ) 1 MG tablet TAKE 2 TABLETS BY MOUTH DAILY 08/08/23  Yes Ennever, Maude SAUNDERS, MD  mercaptopurine  (PURINETHOL ) 50 MG tablet TAKE 1 AND 1/2 TABLETS BY MOUTH  DAILY 01/21/24  Yes Mansouraty, Gabriel Jr., MD  Multiple Vitamin (MULTIVITAMIN WITH MINERALS) TABS tablet Take 1 tablet by mouth daily. 06/28/20  Yes Patsy Lenis, MD  NON FORMULARY Take 1 tablet by mouth daily. Vital reds-energy supplement   Yes [provider]  rosuvastatin  (CRESTOR ) 20 MG tablet TAKE 1 TABLET BY MOUTH DAILY 02/25/24  Yes Frann Mabel Mt, DO  sildenafil  (VIAGRA ) 100 MG tablet Take 1 tablet (100 mg total) by mouth daily as needed for erectile dysfunction. 02/23/24  Yes Frann Mabel Mt, DO  Bepotastine Besilate 1.5 % SOLN 1 drop 2 (two) times daily. 09/07/20   [provider]  inFLIXimab  in sodium chloride  0.9 % Inject into the vein. 01/16/2021 To start taking every 4 weeks.    [provider]  Saccharomyces boulardii (FLORASTOR PO) Take 1 capsule by mouth in the morning and at bedtime.    [provider]    Current Facility-Administered Medications  Medication Dose Route Frequency Provider Last Rate Last Admin   0.9 %  sodium chloride  infusion   Intravenous Continuous Sherial Ebrahim, Inocente HERO, MD        Allergies as of 01/05/2024   (No Known Allergies)    Family History  Problem Relation Age of Onset   Hypertension Mother    Diabetes Mother    Alzheimer's disease Mother    Arthritis Mother    Parkinson's disease Father    Alzheimer's disease Father    Dementia Sister    Colon cancer Neg Hx    Esophageal cancer Neg Hx    Rectal cancer Neg Hx    Stomach cancer Neg Hx    Colon polyps Neg Hx     Social History   Socioeconomic History   Marital status: Married    Spouse name: Joen   Number of children: 2   Years of education: Not on file    Highest education level: Associate degree: academic program  Occupational History   Occupation: Estate agent   Occupation: retired  Tobacco Use   Smoking status: Former    Current packs/day: 0.00    Average packs/day: 1 pack/day for 30.0 years (30.0 ttl pk-yrs)    Types: Cigarettes    Start date: 20    Quit date: 2001    Years since quitting: 24.6   Smokeless tobacco: Never  Vaping Use   Vaping status: Never Used  Substance and Sexual Activity   Alcohol use: Yes    Alcohol/week: 1.0 standard drink of alcohol    Types: 1 Cans of beer per week    Comment: ocassionally   Drug use: Not Currently   Sexual activity: Yes    Birth control/protection: None  Other Topics Concern   Not on file  Social History Narrative   Not on file   Social Drivers of Health   Financial Resource Strain: Low Risk  (02/16/2024)   Overall Financial Resource Strain (CARDIA)    Difficulty of Paying Living Expenses: Not very hard  Food Insecurity: No Food Insecurity (02/16/2024)   Hunger Vital Sign    Worried About Running Out of Food in the Last Year: Never true    Ran Out of Food in the Last Year: Never true  Transportation Needs: No Transportation Needs (02/16/2024)   PRAPARE - Administrator, Civil Service (Medical): No    Lack of Transportation (Non-Medical): No  Physical Activity: Insufficiently Active (02/16/2024)   Exercise Vital Sign    Days of Exercise per Week: 3 days    Minutes of Exercise per Session: 30 min  Stress: No Stress Concern Present (02/16/2024)   [employer] of Occupational Health - Occupational Stress Questionnaire    Feeling of Stress: Not at all  Social Connections: Socially Integrated (02/16/2024)   Social Connection and Isolation Panel    Frequency of Communication with Friends and Family: Three times a week    Frequency of Social Gatherings with Friends and Family: Once a week    Attends Religious Services: More than 4 times per year    Active  Member of [employer] or Organizations: Yes    Attends Engineer, structural: More than 4 times per year    Marital Status: Married  Catering manager Violence: Not At Risk (12/18/2022)   Humiliation, Afraid, Rape, and Kick questionnaire    Fear of Current or Ex-Partner: No    Emotionally Abused: No    Physically Abused: No    Sexually Abused: No    Review of Systems:  All other review of systems negative except as mentioned in the HPI.  Physical Exam: Vital signs BP (!) 184/70   Pulse 86   Temp (!) 97.1 F (36.2 C) (Temporal)   Resp 12  Ht 5' 10 (1.778 m)   Wt 104.3 kg   SpO2 96%   BMI 33.00 kg/m   General:   Alert,  Well-developed, well-nourished, pleasant and cooperative in NAD Lungs:  Clear throughout to auscultation.   Heart:  Regular rate and rhythm; no murmurs, clicks, rubs,  or gallops. Abdomen:  Soft, nontender and nondistended. Normal bowel sounds.   Neuro/Psych:  Normal mood and affect. A and O x 3  Inocente Hausen, MD [facility]

## 2024-03-08 LAB — SURGICAL PATHOLOGY

## 2024-03-09 ENCOUNTER — Encounter (HOSPITAL_COMMUNITY): Payer: Self-pay | Admitting: Pediatrics

## 2024-03-13 ENCOUNTER — Ambulatory Visit: Payer: Self-pay | Admitting: Pediatrics

## 2024-03-17 ENCOUNTER — Non-Acute Institutional Stay (HOSPITAL_COMMUNITY)
Admission: RE | Admit: 2024-03-17 | Discharge: 2024-03-17 | Disposition: A | Discharge status: Home / Self Care | Source: Ambulatory Visit | Attending: Internal Medicine | Admitting: Internal Medicine

## 2024-03-17 VITALS — BP 142/79 | HR 67 | Temp 97.9°F | Resp 16

## 2024-03-17 DIAGNOSIS — K51019 Ulcerative (chronic) pancolitis with unspecified complications: Secondary | ICD-10-CM | POA: Insufficient documentation

## 2024-03-17 DIAGNOSIS — Z8719 Personal history of other diseases of the digestive system: Secondary | ICD-10-CM | POA: Diagnosis not present

## 2024-03-17 LAB — CBC WITH DIFFERENTIAL/PLATELET
Abs Immature Granulocytes: 0.02 K/uL (ref 0.00–0.07)
Basophils Absolute: 0 K/uL (ref 0.0–0.1)
Basophils Relative: 1 %
Eosinophils Absolute: 0.4 K/uL (ref 0.0–0.5)
Eosinophils Relative: 8 %
HCT: 43 % (ref 39.0–52.0)
Hemoglobin: 13.5 g/dL (ref 13.0–17.0)
Immature Granulocytes: 0 %
Lymphocytes Relative: 33 %
Lymphs Abs: 1.6 K/uL (ref 0.7–4.0)
MCH: 30.5 pg (ref 26.0–34.0)
MCHC: 31.4 g/dL (ref 30.0–36.0)
MCV: 97.1 fL (ref 80.0–100.0)
Monocytes Absolute: 0.6 K/uL (ref 0.1–1.0)
Monocytes Relative: 12 %
Neutro Abs: 2.2 K/uL (ref 1.7–7.7)
Neutrophils Relative %: 46 %
Platelets: 236 K/uL (ref 150–400)
RBC: 4.43 MIL/uL (ref 4.22–5.81)
RDW: 14.9 % (ref 11.5–15.5)
WBC: 4.8 K/uL (ref 4.0–10.5)
nRBC: 0 % (ref 0.0–0.2)

## 2024-03-17 LAB — BASIC METABOLIC PANEL WITH GFR
Anion gap: 10 (ref 5–15)
BUN: 17 mg/dL (ref 8–23)
CO2: 24 mmol/L (ref 22–32)
Calcium: 9.5 mg/dL (ref 8.9–10.3)
Chloride: 105 mmol/L (ref 98–111)
Creatinine, Ser: 0.81 mg/dL (ref 0.61–1.24)
GFR, Estimated: >60 mL/min (ref >60)
Glucose, Bld: 105 mg/dL — ABNORMAL HIGH (ref 70–99)
Potassium: 4.5 mmol/L (ref 3.5–5.1)
Sodium: 140 mmol/L (ref 135–145)

## 2024-03-17 LAB — SEDIMENTATION RATE: Sed Rate: 15 mm/h (ref 0–16)

## 2024-03-17 MED ORDER — SODIUM CHLORIDE 0.9 % IV SOLN: INTRAVENOUS | Status: DC | PRN

## 2024-03-17 MED ORDER — SODIUM CHLORIDE 0.9 % IV SOLN
10.0000 mg/kg | INTRAVENOUS | Status: DC
  Administered 2024-03-17: 1000 mg via INTRAVENOUS
  Filled 2024-03-17: qty 100

## 2024-03-17 NOTE — Progress Notes (Signed)
 PATIENT CARE CENTER NOTE     Diagnosis: Ulcerative pancolitis with complication [facility]) [K51.019]       Provider: Aloha Finner MD     Procedure: Remicade  10mg /kg infusion and lab work     Note: Patient received Remicade  infusion (dose # 5 of 6) via PIV. No premeds required per orders.  Labs drawn (CBC w/diff, CMP, CRP, Sed Rate) prior to infusion and will be due again in 2 months. Infusion titrated per protocol. Patient tolerated well with no adverse reaction.  Vital signs stable. Patient declined AVS and has already scheduled next appointment. Patient alert, oriented and ambulatory at discharge.

## 2024-03-18 LAB — HIGH SENSITIVITY CRP: CRP, High Sensitivity: 0.54 mg/L (ref 0.00–3.00)

## 2024-03-22 ENCOUNTER — Ambulatory Visit: Payer: Self-pay | Admitting: Gastroenterology

## 2024-03-22 ENCOUNTER — Encounter: Payer: Self-pay | Admitting: Pediatrics

## 2024-03-26 NOTE — Progress Notes (Signed)
 Patient with history of Chronic Inflammatory Bowel Disease, Ulcerative Colitis with evaluate for active/inactive disease activity is reasoning for CRP evaluation.  Thank you.

## 2024-03-29 NOTE — Progress Notes (Signed)
 "  [facility] Return Visit   Referring Provider Frann Mabel Mt, DO [address] [place],  [place] [address]  Primary Care Provider [place], Mabel Mt, DO  Patient Profile: Justin Rosales is a 73 y.o. male with a past medical history noteworthy for DVT, HTN, pulmonary nodules, anemia  who returns to the [facility] for follow-up of the problem(s) noted below.  Problem List: Pan ulcerative colitis diagnosed 2002 History of prior C. difficile infection requiring vancomycin  and fidaxomicin  History of iritis History of anemia History of colon tubular adenomas History of skin cancer  (BCC x 3) History of DVT - not currently on anticoagulation History of Candida esophagitis   History of Present Illness   Mr. Torok was last seen in the GI office 12/31/2023   Current GI Meds  Infliximab  10 mg/kg every 4 weeks - TDM 4.2025  IFX 33, no Ab 6-mercaptopurine  100 mg p.o.  daily  - TDM 10/2023  6-TGN 250  6-MMP 2943 Florastor  Interval History  Discussed the use of AI scribe software for clinical note transcription with the patient, who gave verbal consent to proceed.  History of Present Illness Justin Rosales returns to the office today to review the results of his recent restaging and surveillance colonoscopy and to discuss changes in medical treatment.  Colonoscopy with chromoendoscopy performed 03/07/2024 - Inactive (Mayo Score 0) ulcerative colitis in the cecum, ascending colon, transverse colon and proximal descending colon.  - Mild Mayo 1 ulcerative colitis in the rectosigmoid and distal descending colon. There was extensive scarring in the left colon. Mucosal abnormalities seen may be more reflective of scarring than true active inflammation.  - Unchanged appearance of splenic flexure lesion compared to colonoscopy in 2024. Chromoendoscopy was performed to highlight edges of the lesion with extensive biopsies performed. No other  abnormal raised lesions seen under chromoendoscopy throughout the colon.  - Three 5 to 6 mm polyps in the descending colon, removed with a cold snare. Resected and retrieved.  - The distal rectum and anal verge are normal on retroflexion view  Pathology did not show any evidence of active inflammation or dysplasia.  The splenic flexure lesion demonstrated inflamed granulation tissue consistent with ulcer.  At today's visit, we discussed the following aspects of Justin Rosales's care concerns:  Fatigue - Significant fatigue since last Remicade  infusion in early August - Fatigue is atypical, as it usually resolves the day after infusion and is now persistent  Gastrointestinal symptoms - Two to four bowel movements per day with mucus - No blood in stool - Stools are not plain diarrhea - Able to control urgency - No nocturnal bowel movements - Some abdominal pain or cramps  Medication therapy and concerns - Currently receiving Remicade  infusions every four weeks + 6 MP 100 mg daily - Concerned about side effects, including risk of skin cancer - Has a pertinent history of BCC x 3 - Desires to change medication regimen  Inflammatory markers and disease monitoring - Elevated calprotectin levels, which is concerning to him - Recent colonoscopies indicate stable disease - Labs 03/17/2024 -CBC, CMP, ESR and CRP - Discussed that fecal calprotectin is a surrogate for assessment of inflammatory activity in the bowel.  Well-documented that some patients with IBD have elevated baseline fecal calprotectin assessments that need to be taken into consideration when interpreting results   GI Review of Symptoms Significant for minimal fecal urgency. Otherwise negative.  General Review of Systems  Review of systems is significant for the  pertinent positives and negatives as listed per the HPI.  Full ROS is otherwise negative.  Inflammatory Bowel Disease History  UC diagnosed ~ 2002 tx'd with  SAZ Hospitalized 2021 with C. Diff colitis (vancomycin , fidaxomicin )  c/b DVT Initiated IFX + 6-MP 2022 with dose optimization of IFX to 10 mg/kg Q4 weeks   IBD Medication History Oral and rectal mesalamine Sulfasalazine  Hydrocortisone suppositories Prednisone  Azathioprine Infliximab   Past Medical History   Past Medical History:  Diagnosis Date   Allergy    Anemia    Arthritis    Blood transfusion without reported diagnosis    C. 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 Dr Timm possibly With [facility] x2. last one done around 2012    COLONOSCOPY N/A 03/07/2024   Procedure: COLONOSCOPY;  Surgeon: Suzann Inocente HERO, MD;  Location: [facility];  Service: Gastroenterology;  Laterality: N/A;  with chromoendoscopy   POLYPECTOMY  03/07/2024   Procedure: POLYPECTOMY, INTESTINE;  Surgeon: Suzann Inocente HERO, MD;  Location: [facility];  Service: Gastroenterology;;     Allergies and Medications   No Known Allergies  Current Meds  Medication Sig   amLODipine -valsartan  (EXFORGE ) 5-160 MG tablet TAKE 1 TABLET BY MOUTH DAILY   Bepotastine Besilate 1.5 % SOLN 1 drop 2 (two) times daily.   Cholecalciferol (DIALYVITE VITAMIN D 5000) 125 MCG (5000 UT) capsule Take 5,000 Units by mouth daily.   folic acid  (FOLVITE ) 1 MG tablet TAKE 2 TABLETS BY MOUTH DAILY   inFLIXimab  in sodium chloride  0.9 % Inject into the vein. 01/16/2021 To start taking every 4  weeks.   mercaptopurine  (PURINETHOL ) 50 MG tablet TAKE 1 AND 1/2 TABLETS BY MOUTH  DAILY   Multiple Vitamin (MULTIVITAMIN WITH MINERALS) TABS tablet Take 1 tablet by mouth daily.   NON FORMULARY Take 1 tablet by mouth daily. Vital reds-energy supplement   rosuvastatin  (CRESTOR ) 20 MG tablet TAKE 1 TABLET BY MOUTH DAILY   Saccharomyces boulardii (FLORASTOR PO) Take 1 capsule by mouth in the morning and at bedtime.   sildenafil  (VIAGRA ) 100 MG tablet Take 1 tablet (100 mg total) by mouth daily as needed for erectile dysfunction.    Family His   Family History  Problem Relation Age of Onset   Hypertension Mother    Diabetes Mother    Alzheimer's disease Mother    Arthritis Mother    Parkinson's disease Father    Alzheimer's disease Father    Dementia Sister    Colon cancer Neg Hx    Esophageal cancer Neg Hx    Rectal cancer Neg Hx    Stomach cancer Neg Hx    Colon polyps Neg Hx    Social History   Social History   Tobacco Use   Smoking status: Former    Current packs/day: 0.00    Average packs/day: 1 pack/day for 30.0 years (30.0 ttl pk-yrs)    Types: Cigarettes    Start  date: 66    Quit date: 2001    Years since quitting: 24.7   Smokeless tobacco: Never  Vaping Use   Vaping status: Never Used  Substance Use Topics   Alcohol use: Yes    Alcohol/week: 1.0 standard drink of alcohol    Types: 1 Cans of beer per week    Comment: ocassionally   Drug use: Not Currently   Maribel reports that he quit smoking about 24 years ago. His smoking use included cigarettes. He started smoking about 54 years ago. He has a 30 pack-year smoking history. He has never used smokeless tobacco. He reports current alcohol use of about 1.0 standard drink of alcohol per week. He reports that he does not currently use drugs.  Vital Signs and Physical Examination   Vitals:   03/30/24 1546  BP: 138/80  Pulse: 97    Body mass index is 32.86 kg/m. Weight: 229 lb (103.9 kg)  General: Well  developed, well nourished, no acute distress Head: Normocephalic and atraumatic, currently with a bandage over left forehead from recent skin cancer removal Eyes: Sclerae anicteric, EOMI Musculoskeletal: Symmetrical with no gross deformities    Review of Data   The following data was reviewed at the time of this encounter:   Laboratory Studies      Latest Ref Rng & Units 03/17/2024    9:01 AM 01/21/2024    8:15 AM 12/25/2023    7:48 AM  CBC  WBC 4.0 - 10.5 K/uL 4.8  4.4  4.7   Hemoglobin 13.0 - 17.0 g/dL 86.4  86.2  86.9   Hematocrit 39.0 - 52.0 % 43.0  42.5  38.4   Platelets 150 - 400 K/uL 236  225  226     Lab Results  Component Value Date   LIPASE 23 06/18/2020      Latest Ref Rng & Units 03/17/2024    9:01 AM 01/21/2024    8:15 AM 12/25/2023    7:48 AM  CMP  Glucose 70 - 99 mg/dL 894  894  885   BUN 8 - 23 mg/dL 17  12  17    Creatinine 0.61 - 1.24 mg/dL 9.18  9.26  9.07   Sodium 135 - 145 mmol/L 140  138  137   Potassium 3.5 - 5.1 mmol/L 4.5  4.2  4.2   Chloride 98 - 111 mmol/L 105  105  105   CO2 22 - 32 mmol/L 24  24  25    Calcium  8.9 - 10.3 mg/dL 9.5  9.2  9.4   Total Protein 6.5 - 8.1 g/dL   7.3   Total Bilirubin 0.0 - 1.2 mg/dL   0.7   Alkaline Phos 38 - 126 U/L   68   AST 15 - 41 U/L   27   ALT 0 - 44 U/L   33     CRP   Lab Results  Component Value Date   IRON  88 12/25/2023   TIBC 329 12/25/2023   FERRITIN 159 12/25/2023   IBD Labs  Prebiologic Labs 11/2022 QuantiFERON gold negative  07/2020 Hepatitis B surface antigen negative Hepatitis B surface antibody nonreactive Hepatitis B core antibody total nonreactive Hepatitis A antibody nonreactive    Therapeutic Drug Monitoring Thiopurine metabolite levels:    Biologic level and antibodies:  Infliximab  level and antibodies   Fecal Calprotectin    Imaging Studies  CTE 07/18/2022 Focal moderate to severe dilatation of the mid transverse colon with some dependent fluid and air.  Just  proximal this is a loop of proximal transverse colon with some nodular wall thickening and submucosal hyperenhancement. With the patient's history this could represent an area of colitis. Please correlate with the findings at endoscopy and correlate to exact location abnormality. More distally the colon is decompressed. And more proximally the small bowel and the very proximal colon is nondilated. No signs of obstruction.   No inflammatory stranding, free air or fluid at this time   Multifocal degenerative changes of the spine and pelvis. There is partial fusion of the sacroiliac joints.  CTAP 06/21/2020 1. Respiratory motion degrades image quality, limiting the evaluation of mid and lower lung zone segmental and subsegmental pulmonary arteries. Otherwise, no pulmonary embolus. 2. Nodular lesions in the right upper and right lower lobes, possibly infectious or inflammatory in etiology. Malignancy cannot be excluded. Non-contrast chest CT at 3-6 months is recommended. If the nodules are stable at time of repeat CT, then future CT at 18-24 months (from today's scan) is considered optional for low-risk patients, but is recommended for high-risk patients. This recommendation follows the consensus statement: Guidelines for Management of Incidental Pulmonary Nodules Detected on CT Images: From the Fleischner Society 2017; Radiology 2017; 284:228-243. 3. Mild proximal jejunal wall thickening with wall thickening involving the distal transverse and proximal sigmoid colon, as well as pericolonic inflammatory haziness. Sigmoid colon may be involved as well. Findings are slightly progressive and indicative of an infectious or inflammatory enterocolitis. 4.  Aortic atherosclerosis (ICD10-I70.0).  CTAP 06/18/2020 1. Left colon is diffusely distended and featureless with mild circumferential wall thickening starting the distal transverse colon through the splenic flexure down to the rectum mild  associated pericolonic edema/inflammation. Imaging features are compatible with infectious/inflammatory colitis 2. Bilateral renal cysts. 3. Aortic Atherosclerosis (ICD10-I70.0).    GI Procedures and Studies  Colonoscopy 03/07/2024 - Inactive (Mayo Score 0) ulcerative colitis in the cecum, ascending colon, transverse colon and proximal descending colon.  - Mild Mayo 1 ulcerative colitis in the rectosigmoid and distal descending colon. There was extensive scarring in the left colon. Mucosal abnormalities seen may be more reflective of scarring than true active inflammation.  - Unchanged appearance of splenic flexure lesion compared to colonoscopy in 2024. Chromoendoscopy was performed to highlight edges of the lesion with extensive biopsies performed. No other abnormal raised lesions seen under chromoendoscopy throughout the colon.  - Three 5 to 6 mm polyps in the descending colon, removed with a cold snare. Resected and retrieved.  - The distal rectum and anal verge are normal on retroflexion view  Pathology did not show any evidence of active inflammation or dysplasia.  The splenic flexure lesion demonstrated inflamed granulation tissue consistent with ulcer.  Colonoscopy 06/03/2023 - Hemorrhoids found on digital rectal exam.  - There was significant looping of the colon.  - Normal mucosa in the transverse colon, at the hepatic flexure, in the ascending colon and in the cecum.  - Rule out malignancy, polypoid lesion at the splenic flexure. Biopsied. Tattooed.  - Granularity in the recto-sigmoid colon, in the sigmoid colon and in the descending colon. Biopsied.  - Multiple 2 to 8 mm polyps at the recto-sigmoid colon, in the sigmoid colon and in the transverse colon, removed with a cold snare. Resected and retrieved. - Normal mucosa in the rectum. Biopsied.  Path:  1. Surgical [P], right colon bx :      -  COLONIC MUCOSA WITH MILD ARCHITECTURAL DISARRAY SUGGESTIVE OF A CHRONIC       INACTIVE  COLITIS GIVEN THE HISTORY OF ULCERATIVE COLITIS.      -  NEGATIVE FOR ACTIVITY, GRANULOMAS, VIRAL CYTOPATHIC EFFECT AND DYSPLASIA.       2. Surgical [P], left colon bx :      -  COLONIC MUCOSA WITH ARCHITECTURAL DISARRAY CONSISTENT WITH A CHRONIC INACTIVE      COLITIS GIVEN THE HISTORY OF ULCERATIVE COLITIS.      -  NEGATIVE FOR ACTIVITY, GRANULOMAS, VIRAL CYTOPATHIC EFFECT AND DYSPLASIA.       3. Surgical [P], colon, rectum bx :      -  COLONIC MUCOSA WITH ARCHITECTURAL DISARRAY AND PANETH CELL METAPLASIA      CONSISTENT WITH A CHRONIC INACTIVE COLITIS GIVEN THE HISTORY OF ULCERATIVE      COLITIS.      -  NEGATIVE FOR ACTIVITY, GRANULOMAS, VIRAL CYTOPATHIC EFFECT AND DYSPLASIA.       4. Surgical [P], colon, transverse, sigmoid, polyp (6) :      -  GRANULATION TISSUE/INFLAMMATORY POLYPS       5. Surgical [P], colon, splenic flexure bx :      -  FOCAL COLONIC MUCOSA WITH EXTENSIVE REACTIVE/REPARATIVE CHANGE IN THE      BACKGROUND OF LAMINA PROPRIA EDEMA, GRANULATION TISSUE AND FIBROPURULENT DEBRIS      CONSISTENT WITH ULCERATION.      -  AN IMMUNOHISTOCHEMICAL STAIN FOR CMV IS PENDING AND WILL BE REPORTED IN AN      ADDENDUM.  Colonoscopy 06/27/2022  - Hemorrhoids found on digital rectal exam.  - There was significant looping of the colon.  - Inflammatory bowel disease. Confluent inflammation was found from the rectum to the transverse colon. There is a tattoo in the distal TC/proximal DC (ulcer is not present but inflammation remains). This was moderate in severity, slightly improved compared to previous examinations. Biopsied.  - Eight, 2 to 6 mm polyps in the descending colon, in the transverse colon and in the ascending colon, removed with a cold snare. Resected and retrieved.  - Non-bleeding non-thrombosed internal hemorrhoids.   Path    Colonoscopy 05/22/2021 - Hemorrhoids found on digital rectal exam.  - The examined portion of the ileum was normal.  -  Diverticulosis in the ascending colon.  - One 3 mm polyp in the ascending colon, removed with a cold snare. Resected and retrieved.  - Normal mucosa in the proximal transverse colon, at the hepatic flexure, in the ascending colon and in the cecum. Biopsied.  - Scar at the hepatic flexure.  - A single (solitary) ulcer in the mid transverse colon and in the distal transverse colon. Biopsied. Tattooed.  - Diffuse moderate mucosal changes were found in the mid transverse colon and in the distal transverse colon secondary to colitis.  - Colitis. Inflammation was found from the sigmoid colon to the splenic flexure. This was moderate in severity and severe, improved compared to previous examinations. Biopsied.  - Normal mucosa in the rectum. Biopsied.  - Non-bleeding non-thrombosed external and internal hemorrhoids.  Path:   EGD/Colonoscopy 07/26/2020  - Esophageal plaques were found, suspicious for candidiasis. Biopsied.  - Z-line regular, 43 cm from the incisors.  - Erythematous mucosa in the stomach. No other gross lesions in the stomach. Biopsied.  - No gross lesions in the duodenal bulb, in the first portion of the duodenum and in the second portion of the duodenum. Biopsied.  - Hemorrhoids found on digital rectal exam.  - Stool in the entire examined colon.  - Normal mucosa  in the transverse colon, in the ascending colon and in the cecum. Biopsied.  - Severe (Mayo Score 3) ulcerative colitis, worsened since the last examination. Fluid aspiration for C. difficile performed and Biopsied.  - Non-bleeding non-thrombosed external and internal hemorrhoids.  Path:   Colonoscopy 07/18/2019 - One 3 mm polyp at the hepatic flexure, removed with a cold snare. Resected and retrieved.  - One 5 mm polyp in the sigmoid colon, removed with a cold snare. Resected and retrieved.  - Diverticulosis in the ascending colon. - The descending colon, transverse colon, ascending colon and cecum are normal.  Biopsied.  - Features of previously diagnosed UC were noted in the rectum, rectosigmoid colon, and distal sigmoid colon. There was mildly active inflammation in the distal rectum (erythema, single ulcer), with inactive features of chronic inflammation in the remainder of the proximal rectum, rectosigmoid, and distal sigmoid colon, characterized by scarring and loss of vascularity. Biopsied.  - Non-bleeding internal hemorrhoids.  Path:   Colonoscopy 06/30/2019 - Normal terminal ileum - Normal colon from cecum to hepatic flexure - Distal to hepatic flexure changes were seen consistent with ulcerative colitis in a continuous and circumferential fashion without skip to areas - Rectum appeared to be relatively spared probably due to use of hydrocortisone suppositories and mesalamine enema/suppositories Path not available  VCE 04/2007 Good prep, capsule root to cecum, minimal erythema in the duodenal bulb, otherwise normal small bowel without lesions  EGD/Colonoscopy 04/02/2007 - Possible mild Candida esophagitis - Nodular tissue inside the pylorus in the proximal duodenal bulb  - Mid sigmoid colon diverticulosis - Small internal hemorrhoids - In the distal 23 cm of the rectosigmoid colon some changes were seen consistent with mild colitis and proctitis with erythema, edema, granularity -there was some relative sparing of the distal rectum - Normal terminal ileum  Path not available  Clinical Impression  It is my clinical impression that Mr. Justin Rosales is a 73 y.o. male with;  Pan ulcerative colitis diagnosed 2002 History of prior C. difficile infection requiring vancomycin  and fidaxomicin  History of iritis History of anemia History of colon tubular adenomas History of skin cancer  (BCC x 3) History of DVT - not currently on anticoagulation History of Candida esophagitis  Mr. Justin Rosales was diagnosed with pan ulcerative colitis in 2002 initially managed with sulfasalazine  in conjunction with  folic acid  for many years.  In 2021 he incurred a severe flare after contracting C. difficile infection resulting in inpatient hospitalization.  His course was further complicated by a DVT at that time.  After treatment of his C. difficile infection he was initiated on combination therapy with infliximab  and 6-MP in 2022.  Over the course of that year his infliximab  dosing was optimized to 10 mg/kg every 4 weeks due to low serum infliximab  level.  Most recent therapeutic drug monitoring in April 2025 documented therapeutic anti-TNF and thiopurine levels.  Restaging and surveillance colonoscopy with chromoendoscopy 02/2024 confirmed that his colon is largely healed on combination therapy.  There has been concern regarding worrisome appearance of his colonic mucosa at the splenic flexure worrisome for possible evolving dysplasia.  Fortunately this has not shown any dysplastic changes on multiple colonoscopies.  Although anti-TNF and thiopurine combination therapy appears to be resulting in clinical improvement, Justin Rosales is requiring high and frequent doses of infliximab  10 mg/kg every 4 weeks in conjunction with 6-MP.  He endorses ongoing fatigue which he attributes to his current treatment..  It is also noteworthy that he is developing multiple basal cell  carcinomas which may be a sequelae of combination/thiopurine therapy.  Reviewed that statistically the highest risk of lymphoma in the setting of IBD is with the use of combination therapy and in males over 65.  In light of these factors, Justin Rosales is open to consideration of transitioning to alternate IBD therapies.  At today's visit we discussed changing to anti-IL 23 therapy which would hopefully balance risks of immune suppression and malignancy.  Based on current available data, I have recommended initiation of Tremfya  for its anti-IL 23 and CD64 binding mechanism.  For maintenance therapy I would advise higher dosing of 200 mg subcutaneously every 4 weeks given  Justin Rosales's history of previous biologic exposure, aggressive disease and need for high-dose/combination therapy with infliximab .  Reviewed that we will need to assess what is covered by his insurance.  Omvoh or Skyrizi would be alternate considerations depending upon insurance formulary.  We reviewed potential side effects and long-term risks of medication and Justin Rosales was agreeable to proceeding with change in treatment.  Anticipate he will need to continue on 6-MP during the transition to a new drug but once stable this can be withdrawn.  Plan  Discontinue infliximab  Start Tremfya  induction 200 mg IV at week 0, 4 and 8; subsequent maintenance therapy 200 mg subcutaneously every 4 weeks.  Justin Rosales's next infliximab  infusion would have been due 04/14/2024 -if insurance authorization can be obtained in a timely fashion Tremfya  can be substituted for infliximab  at the time of that scheduled infusion. Continue 6-MP 100 mg orally daily -Will continue immune modulator therapy as he transitions to new biologic therapy with intent to withdraw this when clinical pellety is confirmed Continue quarterly immune suppressant laboratory monitoring Continue periodic disease activity assessments with fecal calprotectin Anticipate restaging colonoscopy within 1 year of transitioning treatment Continue probiotic; limit the use of antibiotics in the setting of previous C. difficile Monitor weight and anthropometrics  IBD Health Maintenance  Vaccinations Influenza: PCV13: PPSV23: COVID19: HAV/HBV: Shingles: HPV:  DEXA PRN if requires further steroids  Eye Exam Follows with ophthalmology for iritis  Skin Exam Follows with dermatology due to [facility]  Surveillance Colonoscopy 02/2024 - no dysplasia; repeat 1 year  Tobacco Use Remote use none current  Depression Screen    Planned Follow Up RTC 3 months  The patient or caregiver verbalized understanding of the material covered, with no barriers to understanding. All questions  were answered. Patient or caregiver is agreeable with the plan outlined above.    It was a pleasure to see Justin Rosales.  If you have any questions or concerns regarding this evaluation, do not hesitate to contact me.  Inocente Hausen, MD [facility]   I spent total of 30 minutes in both face-to-face (20 minutes interview) and non-face-to-face (10 minutes chart review, care coordination, documentation)  activities, excluding procedures performed, for the visit on the date of this encounter.  "

## 2024-03-30 ENCOUNTER — Encounter: Payer: Self-pay | Admitting: Pediatrics

## 2024-03-30 ENCOUNTER — Ambulatory Visit: Admitting: Pediatrics

## 2024-03-30 VITALS — BP 138/80 | HR 97 | Ht 70.0 in | Wt 229.0 lb

## 2024-03-30 DIAGNOSIS — Z7960 Long term (current) use of unspecified immunomodulators and immunosuppressants: Secondary | ICD-10-CM | POA: Diagnosis not present

## 2024-03-30 DIAGNOSIS — K5100 Ulcerative (chronic) pancolitis without complications: Secondary | ICD-10-CM | POA: Diagnosis not present

## 2024-03-30 DIAGNOSIS — Z860101 Personal history of adenomatous and serrated colon polyps: Secondary | ICD-10-CM

## 2024-03-30 DIAGNOSIS — Z8619 Personal history of other infectious and parasitic diseases: Secondary | ICD-10-CM | POA: Diagnosis not present

## 2024-03-30 DIAGNOSIS — K51019 Ulcerative (chronic) pancolitis with unspecified complications: Secondary | ICD-10-CM

## 2024-03-30 NOTE — Patient Instructions (Addendum)
 Follow up in 3-4 months or sooner if needed.  Thank you for entrusting me with your care and for choosing [facility], Dr. Inocente Hausen  _______________________________________________________  If your blood pressure at your visit was 140/90 or greater, please contact your primary care physician to follow up on this.  _______________________________________________________  If you are age 73 or older, your body mass index should be between 23-30. Your Body mass index is 32.86 kg/m. If this is out of the aforementioned range listed, please consider follow up with your Primary Care Provider.  If you are age 61 or younger, your body mass index should be between 19-25. Your Body mass index is 32.86 kg/m. If this is out of the aformentioned range listed, please consider follow up with your Primary Care Provider.   ________________________________________________________  The [facility] providers would like to encourage you to use MYCHART to communicate with providers for non-urgent requests or questions.  Due to long hold times on the telephone, sending your provider a message by [facility] may be a faster and more efficient way to get a response.  Please allow 48 business hours for a response.  Please remember that this is for non-urgent requests.  _______________________________________________________  [facility] is using a team-based approach to care.  Your team is made up of your doctor and two to three APPS. Our APPS (Nurse Practitioners and Physician Assistants) work with your physician to ensure care continuity for you. They are fully qualified to address your health concerns and develop a treatment plan. They communicate directly with your gastroenterologist to care for you. Seeing the Advanced Practice Practitioners on your physician's team can help you by facilitating care more promptly, often allowing for earlier appointments, access to diagnostic testing, procedures,  and other specialty referrals.

## 2024-03-31 ENCOUNTER — Other Ambulatory Visit: Payer: Self-pay | Admitting: Pediatrics

## 2024-03-31 ENCOUNTER — Other Ambulatory Visit (HOSPITAL_COMMUNITY): Payer: Self-pay

## 2024-03-31 ENCOUNTER — Telehealth: Payer: Self-pay

## 2024-03-31 ENCOUNTER — Encounter: Payer: Self-pay | Admitting: Hematology & Oncology

## 2024-03-31 ENCOUNTER — Telehealth: Payer: Self-pay | Admitting: Pharmacy Technician

## 2024-03-31 ENCOUNTER — Encounter: Payer: Self-pay | Admitting: Pediatrics

## 2024-03-31 NOTE — Telephone Encounter (Signed)
 Pharmacy Patient Advocate Encounter   Received notification from Pt Calls Messages that prior authorization for Tremfya 200MG /2ML syringes is required/requested.   Insurance verification completed.   The patient is insured through [facility] .   Per test claim: PA required; PA submitted to above mentioned insurance via Latent Key/confirmation #/EOC [id number] Status is pending

## 2024-03-31 NOTE — Telephone Encounter (Addendum)
 Rosina, Patient will need auth submitted for SQ dosing for Tremfya. Once approved please let me know so we can move fowrad with scheduling patient for the IV dosing.  Auth Submission: NO AUTH NEEDED Site of care: Site of care: [facility] Payer: [facility] MEDICARE Medication & CPT/J Code(s) submitted: TREMFYA [id number] Diagnosis Code: K51.919 Route of submission (phone, fax, portal): PORTAL Phone # Fax # Auth type: Buy/Bill PB Units/visits requested: 200MG  Q4WKS X3 DOSES Reference number: [id number] Approval from: 03/31/24 to 07/20/24

## 2024-04-04 ENCOUNTER — Telehealth: Payer: Self-pay

## 2024-04-04 MED ORDER — TREMFYA PEN 200 MG/2ML ~~LOC~~ SOAJ: 1.0000 | SUBCUTANEOUS | Status: DC

## 2024-04-04 NOTE — Telephone Encounter (Signed)
-----   Message from Justin Rosales sent at 03/31/2024 12:58 PM EDT ----- Regarding: RE: Changing infusion orders Thanks for your help.  I went ahead and placed an infusion order set for Tremfya  at [facility] infusion center on [address].  Justin Rosales, can you please do the following this week:  1.  Call the infusion center on [address] to explain the situation that we are transitioning to Tremfya  and that [facility] infusion center does not carry it which is the reason we are moving his infusions to [facility].  Please also find out if we will need to put a cancellation on the infusion orders at [facility] as it may cause insurance hiccups if they see there is already a biologic ordered and a new authorization is being placed for a concomitant one.  2.  Please let the patient know that we are working on insurance authorization but it will require him to change infusion centers.  He indicated to me that he would not have a problem with that if it was necessary.  3.  He will need maintenance injections 200 mg SQ every 4 weeks.  I was not sure which specialty pharmacy to route this through..  Please investigate which pharmacy this should be sent to and start the process of insurance authorization for the maintenance injections.  Thanks,  Justin ----- Message ----- From: Mercer Cristino SAILOR, RN Sent: 03/31/2024  11:28 AM EDT To: Justin JONELLE Coombs, PA-C; Justin CHRISTELLA Hausen, MD Subject: RE: Changing infusion orders                   Hi Dr. Hausen,   It looks like patient receives infusions at the [facility]. Those orders are not entered in the Infusion Navigator. I called over there because I did not see Tremfya  listed under their offered medications. They do not offer Tremfya . Patient would need to go to the [facility] for induction infusions.  Thanks,  Mulberry, RN ----- Message ----- From: Rosales Justin CHRISTELLA, MD Sent: 03/31/2024   9:07 AM EDT To: Justin JONELLE Coombs, PA-C; [facility] Triage Subject: Changing infusion orders                       Doc Mandala/Amanda -  I am reaching out for your help/guidance changing infusion orders for Justin Rosales.  I saw him in clinic yesterday and we discussed changing from infliximab  + 6-MP to Tremfya .  The computer says he receives infusions at [facility] -he told me that he goes to the  sickle cell center for infusions  I went into the Infusion Navigator with the intent to discontinue plan for infliximab  and start a new one for Tremfya  and I did not see any infusion orders listed.    Are the infusion orders for [facility] infusion entered in a different way?  Can you please let me know how to proceed?  I wanted to get his new infusion plan entered today because there is time sensitivity to insurance authorization.  He is currently scheduled for his next infliximab  infusion on 04/14/2024.  If we can get insurance authorization a timely fashion, the infusion center can potentially swap out his currently scheduled infliximab  infusion date on 04/14/2024 for Tremfya  instead.  I am off today but working on clinic notes throughout the morning so I will be checking the computer.  Thanks!  Justin

## 2024-04-04 NOTE — Telephone Encounter (Signed)
 Justin Rosales, please submit PA for Tremfya  200 mg SQ every 4 weeks. We will need to know what specialty pharmacy to send RX to. Thanks

## 2024-04-04 NOTE — Telephone Encounter (Signed)
 No auth needed for Tremfya  induction infusions. See alternate TE.  MyChart message to patient with information. See alternate TE. PA team will submit auth for sub-q maintenance dose.

## 2024-04-04 NOTE — Addendum Note (Signed)
 Addended by: Abdoulaye Drum N on: 04/04/2024 08:19 AM   Modules accepted: Orders

## 2024-04-04 NOTE — Telephone Encounter (Signed)
 PA request has been Submitted. New Encounter has been or will be created for follow up. For additional info see Pharmacy Prior Auth telephone encounter from 03-31-2024.

## 2024-04-05 NOTE — Telephone Encounter (Signed)
 Pharmacy Patient Advocate Encounter  Received notification from [employer] that Prior Authorization for Tremfya  200MG /2ML syringes has been DENIED.  Full denial letter will be uploaded to the media tab. See denial reason below.  This medicine is covered only if: You have been established on therapy with Tremfya  under an active [employer] medical benefit prior authorization for the treatment of moderately to severe ulcerative colitis.  PA #/Case ID/Reference #: [id number]

## 2024-04-05 NOTE — Telephone Encounter (Signed)
 Dr. Suzann, Patient will be scheduled as soon as possible. Once the IV dosing is completed the pharmarcy benefit team will re-submitt the auth for the SQ dosing.  Currently it has been denied due to he has not been established on therapy yet.  @Ashley  - thanks :) Patient will be scheduled as soon as possible for IV dosing.  Once he has completed IV dosing, please re-submit auth for SQ dosing

## 2024-04-06 ENCOUNTER — Ambulatory Visit: Admitting: Family Medicine

## 2024-04-06 ENCOUNTER — Encounter: Payer: Self-pay | Admitting: Family Medicine

## 2024-04-06 VITALS — BP 138/78 | HR 87 | Temp 97.7°F | Resp 16 | Ht 70.0 in | Wt 227.0 lb

## 2024-04-06 DIAGNOSIS — D489 Neoplasm of uncertain behavior, unspecified: Secondary | ICD-10-CM | POA: Diagnosis not present

## 2024-04-06 MED ORDER — TRIAMCINOLONE ACETONIDE 0.1 % EX CREA: 1.0000 | TOPICAL_CREAM | Freq: Two times a day (BID) | CUTANEOUS | 0 refills | Status: AC

## 2024-04-06 NOTE — Progress Notes (Signed)
 Chief Complaint  Patient presents with   Rash    Rash     Justin Rosales is a 73 y.o. male here for a skin complaint.  Duration: 2 weeks Location: R collar bone Pruritic? Yes Painful? No Drainage? No New soaps/lotions/topicals/detergents? No Trauma? No Other associated symptoms: scaly; not changing in size Therapies tried thus far: cortisone cream  Past Medical History:  Diagnosis Date   Allergy    Anemia    Arthritis    Blood transfusion without reported diagnosis    C. 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 No drainage, erythema, TTP, fluctuance, excoriation Psych: Age appropriate judgment and insight     L lateral neck   R mid 1/3 clavicle   Neoplasm of uncertain behavior - Plan: Ambulatory referral to Dermatology  Refer derm.  He has seen [facility]. Kenalog  cream sent in for symptomatic management. AK's returned after freezing. 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  F/u prn. The patient voiced understanding and agreement to the plan.  Mabel [place], DO 04/06/24 9:55 AM

## 2024-04-06 NOTE — Patient Instructions (Signed)
 Try not to scratch as this can make things worse. Avoid scented products while dealing with this. You may resume when the itchiness resolves. Cold/cool compresses can help.   Please call [facility] for an appt regarding your skin lesions. Let me know if they give you any hassle.   Let us  know if you need anything.

## 2024-04-07 ENCOUNTER — Telehealth: Payer: Self-pay

## 2024-04-07 NOTE — Telephone Encounter (Signed)
 Copied from CRM [id number]. Topic: General - Other >> Apr 06, 2024  2:10 PM Rosaria E wrote: Reason for CRM: Pt called to thank Rosina in the infusion center for her years of excellent service.

## 2024-04-08 NOTE — Telephone Encounter (Signed)
 Tremfya  infusions scheduled for 04/20/24, 05/18/24, and 06/15/24

## 2024-04-14 ENCOUNTER — Non-Acute Institutional Stay (HOSPITAL_COMMUNITY)

## 2024-04-14 ENCOUNTER — Other Ambulatory Visit (HOSPITAL_COMMUNITY): Payer: Self-pay

## 2024-04-20 ENCOUNTER — Ambulatory Visit

## 2024-04-21 ENCOUNTER — Ambulatory Visit

## 2024-04-27 ENCOUNTER — Ambulatory Visit (INDEPENDENT_AMBULATORY_CARE_PROVIDER_SITE_OTHER)

## 2024-04-27 VITALS — BP 153/88 | HR 69 | Temp 98.3°F | Resp 16 | Ht 70.0 in | Wt 228.8 lb

## 2024-04-27 DIAGNOSIS — K51019 Ulcerative (chronic) pancolitis with unspecified complications: Secondary | ICD-10-CM | POA: Diagnosis not present

## 2024-04-27 MED ORDER — SODIUM CHLORIDE 0.9 % IV SOLN
200.0000 mg | Freq: Once | INTRAVENOUS | Status: AC
  Administered 2024-04-27: 200 mg via INTRAVENOUS
  Filled 2024-04-27: qty 20

## 2024-04-27 NOTE — Progress Notes (Signed)
 Diagnosis: Ulcerative Colitis  Provider:  Praveen Mannam MD  Procedure: IV Infusion  IV Type: Peripheral, IV Location: r wrist   Tremfya , Dose: 200 mg  Infusion Start Time: 1156  Infusion Stop Time: 1308  Post Infusion IV Care: Observation period completed and Peripheral IV Discontinued  Discharge: Condition: Good, Destination: Home . AVS Declined  Performed by:  Leita FORBES Miles, LPN

## 2024-05-02 ENCOUNTER — Other Ambulatory Visit (HOSPITAL_COMMUNITY): Payer: Self-pay

## 2024-05-09 ENCOUNTER — Telehealth: Payer: Self-pay

## 2024-05-09 ENCOUNTER — Other Ambulatory Visit (HOSPITAL_COMMUNITY): Payer: Self-pay

## 2024-05-09 MED ORDER — TREMFYA PEN 200 MG/2ML ~~LOC~~ SOAJ: 1.0000 | SUBCUTANEOUS | 5 refills | Status: AC

## 2024-05-09 NOTE — Telephone Encounter (Signed)
 Pharmacy Patient Advocate Encounter  Received notification from [facility] that Prior Authorization for Tremfya  Pen 200MG /2ML auto-injectors has been APPROVED from 05-09-2024 to 05-09-2025  Must fill at outside specialty pharmacy. Insurance will not allow fill at [facility].  PA #/Case ID/Reference #: [id number]

## 2024-05-09 NOTE — Telephone Encounter (Signed)
 Pharmacy Patient Advocate Encounter   Received notification from Physician's Office that prior authorization for Tremfya  Pen 200MG /2ML auto-injectors is required/requested.   Insurance verification completed.   The patient is insured through [facility].   Per test claim: PA required; PA submitted to above mentioned insurance via Latent Key/confirmation #/EOC [id number] Status is pending

## 2024-05-09 NOTE — Addendum Note (Signed)
 Addended by: Rakisha Pincock N on: 05/09/2024 11:59 AM   Modules accepted: Orders

## 2024-05-12 ENCOUNTER — Encounter (HOSPITAL_COMMUNITY)

## 2024-05-17 ENCOUNTER — Other Ambulatory Visit (HOSPITAL_COMMUNITY): Payer: Self-pay

## 2024-05-17 NOTE — Telephone Encounter (Signed)
 It sounds like the patient is not eligible for the savings card program because he has Medicare Part D.   There is Patient Assistance available that a form can be filled out for but he must meet certain criteria. Since his insurance does pay for the medication he is required to provide certain information. This is detailed below.   So a pharmacy expense report to show the costs of his medications and what he has paid for the year as well as contacting his insurance or Social Security for LIS. They can typically do this over the phone. If he is denied, he will have to provide the denial letter to us  for submission.

## 2024-05-18 ENCOUNTER — Ambulatory Visit

## 2024-05-24 ENCOUNTER — Ambulatory Visit (INDEPENDENT_AMBULATORY_CARE_PROVIDER_SITE_OTHER)

## 2024-05-24 VITALS — Ht 70.0 in | Wt 227.0 lb

## 2024-05-24 DIAGNOSIS — Z0000 Encounter for general adult medical examination without abnormal findings: Secondary | ICD-10-CM | POA: Diagnosis not present

## 2024-05-24 NOTE — Patient Instructions (Signed)
 Mr. Delisle,  Thank you for taking the time for your Medicare Wellness Visit. I appreciate your continued commitment to your health goals. Please review the care plan we discussed, and feel free to reach out if I can assist you further.  Please note that Annual Wellness Visits do not include a physical exam. Some assessments may be limited, especially if the visit was conducted virtually. If needed, we may recommend an in-person follow-up with your provider.  Ongoing Care Seeing your primary care provider every 3 to 6 months helps us  monitor your health and provide consistent, personalized care.   Referrals If a referral was made during today's visit and you haven't received any updates within two weeks, please contact the referred provider directly to check on the status.  Recommended Screenings:  Health Maintenance  Topic Date Due   Zoster (Shingles) Vaccine (1 of 2) 03/22/1970   Flu Shot  02/19/2024   Colon Cancer Screening  03/07/2025   DTaP/Tdap/Td vaccine (2 - Td or Tdap) 03/21/2025   Medicare Annual Wellness Visit  05/24/2025   Pneumococcal Vaccine for age over 64  Completed   Hepatitis C Screening  Completed   Meningitis B Vaccine  Aged Out   COVID-19 Vaccine  Discontinued       05/24/2024    8:47 AM  Advanced Directives  Does Patient Have a Medical Advance Directive? No  Would patient like information on creating a medical advance directive? No - Patient declined    Vision: Annual vision screenings are recommended for early detection of glaucoma, cataracts, and diabetic retinopathy. These exams can also reveal signs of chronic conditions such as diabetes and high blood pressure.  Dental: Annual dental screenings help detect early signs of oral cancer, gum disease, and other conditions linked to overall health, including heart disease and diabetes.  Please see the attached documents for additional preventive care recommendations.

## 2024-05-24 NOTE — Progress Notes (Signed)
 Subjective:   Justin Rosales is a 73 y.o. male who presents for a Medicare Annual Wellness Visit.  Allergies (verified) Patient has no known allergies.   History: Past Medical History:  Diagnosis Date   Allergy    Anemia    Arthritis    Blood transfusion without reported diagnosis    C. 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 Dr Timm possibly With [facility] x2. last one done around 2012    COLONOSCOPY N/A 03/07/2024   Procedure: COLONOSCOPY;  Surgeon: Suzann Inocente HERO, MD;  Location: [facility]OSCOPY;  Service: Gastroenterology;  Laterality: N/A;  with chromoendoscopy   POLYPECTOMY  03/07/2024   Procedure: POLYPECTOMY, INTESTINE;  Surgeon: Suzann Inocente HERO, MD;  Location: [facility];  Service: Gastroenterology;;   Family History  Problem Relation Age of Onset   Hypertension Mother    Diabetes Mother    Alzheimer's disease Mother    Arthritis Mother    Parkinson's disease Father    Alzheimer's disease Father    Dementia Sister    Colon cancer Neg Hx    Esophageal cancer Neg Hx    Rectal cancer Neg Hx    Stomach cancer Neg Hx    Colon polyps Neg Hx    Social History   Occupational History   Occupation: Estate Agent   Occupation: retired  Tobacco Use   Smoking status: Former    Current packs/day: 0.00    Average packs/day: 1 pack/day for 30.0 years (30.0 ttl pk-yrs)    Types: Cigarettes    Start date: 42    Quit date: 2001    Years since quitting:  24.8   Smokeless tobacco: Never  Vaping Use   Vaping status: Never Used  Substance and Sexual Activity   Alcohol use: Yes    Alcohol/week: 1.0 standard drink of alcohol    Types: 1 Cans of beer per week    Comment: ocassionally   Drug use: Not Currently   Sexual activity: Yes    Birth control/protection: None   Tobacco Counseling Counseling given: Not Answered  SDOH Screenings   Food Insecurity: No Food Insecurity (05/24/2024)  Housing: Low Risk  (05/24/2024)  Transportation Needs: No Transportation Needs (05/24/2024)  Utilities: Not At Risk (05/24/2024)  Alcohol Screen: Low Risk  (05/17/2024)  Depression (PHQ2-9): Low Risk  (05/24/2024)  Financial Resource Strain: Low Risk  (05/17/2024)  Physical Activity: Insufficiently Active (05/24/2024)  Social Connections: Socially Integrated (05/24/2024)  Stress: No Stress Concern Present (05/24/2024)  Tobacco Use: Medium Risk (04/06/2024)  Health Literacy: Adequate Health Literacy (05/24/2024)   Depression Screen    05/24/2024    8:51 AM 04/06/2024    9:38 AM 06/09/2023   10:01 AM 12/18/2022   10:23 AM 06/02/2022    6:59 AM 10/01/2021    3:09 PM 12/07/2020    8:19 AM  PHQ 2/9 Scores  PHQ - 2 Score 0 0 0 0 0 0 0  PHQ- 9 Score 1  1 0  0       Goals Addressed             This Visit's Progress    Patient Stated       Stay active and keep weight down       Visit info / Clinical Intake: Medicare Wellness Visit Type:: Subsequent Annual Wellness Visit Medicare Wellness Visit Mode:: Telephone If telephone:: video declined If telephone or video:: pt reported vitals Interpreter Needed?: No Pre-visit prep was completed: yes AWV questionnaire completed by patient prior to visit?: no Living arrangements:: lives with spouse/significant other Patient's Overall Health Status Rating: very good Typical amount of pain: none Does pain affect daily life?: no Are you currently prescribed opioids?: no  Dietary Habits and Nutritional Risks How  many meals a day?: 2 Eats fruit and vegetables daily?: yes Most meals are obtained by: preparing own meals Diabetic:: no  Functional Status Activities of Daily Living (to include ambulation/medication): Independent Ambulation: Independent Medication Administration: Independent Home Management: Independent Manage your own finances?: yes Primary transportation is: driving Concerns about vision?: no *vision screening is required for WTM* Concerns about hearing?: no  Fall Screening Falls in the past year?: 0 Number of falls in past year: 0 Was there an injury with Fall?: 0 Fall Risk Category Calculator: 0 Patient Fall Risk Level: Low Fall Risk  Fall Risk Patient at Risk for Falls Due to: No Fall Risks Fall risk Follow up: Falls evaluation completed  Home and Transportation Safety: All rugs have non-skid backing?: yes All stairs or steps have railings?: yes Grab bars in the bathtub or shower?: yes Have non-skid surface in bathtub or shower?: yes Good home lighting?: yes Regular seat belt use?: yes Hospital stays in the last year:: no  Cognitive Assessment Difficulty concentrating, remembering, or making decisions? : no Will 6CIT or Mini Cog be Completed: no 6CIT or Mini Cog Declined: patient alert, oriented, able to answer questions appropriately and recall recent events  Advance Directives (For Healthcare) Does Patient Have a Medical Advance Directive?: No Does patient want to make changes to medical advance directive?: No - Patient declined Type of Advance Directive: Healthcare Power of [place]; Living will Copy of Healthcare Power of Attorney in Chart?: No - copy requested Copy of Living Will in Chart?: No - copy requested Would patient like information on creating a medical advance directive?: No - Patient declined  Reviewed/Updated  Reviewed/Updated: All        Objective:    Today's Vitals   05/24/24 0845  Weight: 227 lb (103 kg)  Height: 5' 10 (1.778 m)    Body mass index is 32.57 kg/m.  Current Medications (verified) Outpatient Encounter Medications as of 05/24/2024  Medication Sig   amLODipine -valsartan  (EXFORGE ) 5-160 MG tablet TAKE 1 TABLET BY MOUTH DAILY   Bepotastine Besilate 1.5 % SOLN 1 drop 2 (two) times daily.   Cholecalciferol (DIALYVITE VITAMIN D 5000) 125 MCG (5000 UT) capsule Take 5,000 Units by mouth daily.   folic acid  (FOLVITE ) 1 MG tablet TAKE 2 TABLETS BY MOUTH DAILY   Guselkumab  (TREMFYA  PEN) 200 MG/2ML SOAJ Inject 1 pen  into the skin every 28 (twenty-eight) days.   mercaptopurine  (PURINETHOL ) 50 MG tablet TAKE 1 AND 1/2 TABLETS BY MOUTH  DAILY   Multiple Vitamin (MULTIVITAMIN WITH MINERALS) TABS tablet Take 1 tablet by mouth daily.   rosuvastatin  (CRESTOR ) 20 MG tablet TAKE 1 TABLET BY MOUTH DAILY   Saccharomyces boulardii (FLORASTOR PO) Take 1 capsule by mouth in  the morning and at bedtime.   sildenafil  (VIAGRA ) 100 MG tablet Take 1 tablet (100 mg total) by mouth daily as needed for erectile dysfunction.   triamcinolone  cream (KENALOG ) 0.1 % Apply 1 Application topically 2 (two) times daily.   NON FORMULARY Take 1 tablet by mouth daily. Vital reds-energy supplement   No facility-administered encounter medications on file as of 05/24/2024.   Hearing/Vision screen Hearing Screening - Comments:: No issue Vision Screening - Comments:: No issues- Dr Mannie Immunizations and Health Maintenance Health Maintenance  Topic Date Due   Zoster Vaccines- Shingrix (1 of 2) 03/22/1970   Medicare Annual Wellness (AWV)  12/18/2023   Influenza Vaccine  02/19/2024   Colonoscopy  03/07/2025   DTaP/Tdap/Td (2 - Td or Tdap) 03/21/2025   Pneumococcal Vaccine: 50+ Years  Completed   Hepatitis C Screening  Completed   Meningococcal B Vaccine  Aged Out   COVID-19 Vaccine  Discontinued        Assessment/Plan:  This is a routine wellness examination for Justin Rosales.  Patient Care Team: Frann Mabel Mt, DO as PCP - General  (Family Medicine)  I have personally reviewed and noted the following in the patient's chart:   Medical and social history Use of alcohol, tobacco or illicit drugs  Current medications and supplements including opioid prescriptions. Functional ability and status Nutritional status Physical activity Advanced directives List of other physicians Hospitalizations, surgeries, and ER visits in previous 12 months Vitals Screenings to include cognitive, depression, and falls Referrals and appointments  No orders of the defined types were placed in this encounter.  In addition, I have reviewed and discussed with patient certain preventive protocols, quality metrics, and best practice recommendations. A written personalized care plan for preventive services as well as general preventive health recommendations were provided to patient.   Arnette LOISE Hoots, CMA   05/24/2024   No follow-ups on file.  After Visit Summary: (Declined) Due to this being a telephonic visit, with patients personalized plan was offered to patient but patient Declined AVS at this time   Nurse Notes: Patient will be getting his second shingles vaccine and his flu shot this coming week at pharmacy.

## 2024-05-25 ENCOUNTER — Ambulatory Visit

## 2024-05-25 VITALS — BP 168/97 | HR 77 | Temp 97.8°F | Resp 12 | Ht 70.0 in | Wt 231.6 lb

## 2024-05-25 DIAGNOSIS — K51019 Ulcerative (chronic) pancolitis with unspecified complications: Secondary | ICD-10-CM

## 2024-05-25 MED ORDER — SODIUM CHLORIDE 0.9 % IV SOLN
200.0000 mg | Freq: Once | INTRAVENOUS | Status: AC
  Administered 2024-05-25: 200 mg via INTRAVENOUS
  Filled 2024-05-25: qty 20

## 2024-05-25 NOTE — Progress Notes (Signed)
 Diagnosis: Ulcerative pancolitis with complication   Provider:  Praveen Mannam MD  Procedure: IV Infusion  IV Type: Peripheral, IV Location: R Forearm  Tremfya , Dose: 200 mg  Infusion Start Time: 0926  Infusion Stop Time: 1029  Post Infusion IV Care: Peripheral IV Discontinued  Discharge: Condition: Good, Destination: Home . AVS Declined  Performed by:  Maximiano JONELLE Pouch, LPN

## 2024-05-25 NOTE — Progress Notes (Deleted)
 I connected with  Justin Rosales on 05/30/24 by a audio enabled telemedicine application and verified that I am speaking with the correct person using two identifiers.  Patient Location: Home  Provider Location: Home Office  Persons Participating in Visit: Patient.  I discussed the limitations of evaluation and management by telemedicine. The patient expressed understanding and agreed to proceed.  Vital Signs: Because this visit was a virtual/telehealth visit, some criteria may be missing or patient reported. Any vitals not documented were not able to be obtained and vitals that have been documented are patient reported.

## 2024-05-31 ENCOUNTER — Other Ambulatory Visit: Payer: Self-pay | Admitting: *Deleted

## 2024-06-03 NOTE — Progress Notes (Addendum)
 I connected with  Justin Rosales on 05/24/2024 by a audio enabled telemedicine application and verified that I am speaking with the correct person using two identifiers.  Patient Location: Home  Provider Location: Home Office  Persons Participating in Visit: Patient.  I discussed the limitations of evaluation and management by telemedicine. The patient expressed understanding and agreed to proceed.   Vital Signs: Because this visit was a virtual/telehealth visit, some criteria may be missing or patient reported. Any vitals not documented were not able to be obtained and vitals that have been documented are patient reported.

## 2024-06-09 ENCOUNTER — Encounter (HOSPITAL_COMMUNITY)

## 2024-06-10 ENCOUNTER — Encounter: Payer: Self-pay | Admitting: Hematology & Oncology

## 2024-06-10 ENCOUNTER — Inpatient Hospital Stay: Attending: Hematology & Oncology

## 2024-06-10 ENCOUNTER — Inpatient Hospital Stay: Admitting: Hematology & Oncology

## 2024-06-10 ENCOUNTER — Ambulatory Visit: Payer: Self-pay | Admitting: Hematology & Oncology

## 2024-06-10 ENCOUNTER — Inpatient Hospital Stay

## 2024-06-10 VITALS — BP 180/79 | HR 90 | Temp 97.7°F | Resp 20 | Ht 70.0 in | Wt 229.0 lb

## 2024-06-10 DIAGNOSIS — K909 Intestinal malabsorption, unspecified: Secondary | ICD-10-CM

## 2024-06-10 DIAGNOSIS — I824Z1 Acute embolism and thrombosis of unspecified deep veins of right distal lower extremity: Secondary | ICD-10-CM | POA: Diagnosis not present

## 2024-06-10 DIAGNOSIS — D500 Iron deficiency anemia secondary to blood loss (chronic): Secondary | ICD-10-CM | POA: Insufficient documentation

## 2024-06-10 DIAGNOSIS — K51911 Ulcerative colitis, unspecified with rectal bleeding: Secondary | ICD-10-CM | POA: Insufficient documentation

## 2024-06-10 DIAGNOSIS — D631 Anemia in chronic kidney disease: Secondary | ICD-10-CM

## 2024-06-10 LAB — CMP (CANCER CENTER ONLY)
ALT: 42 U/L (ref 0–44)
AST: 34 U/L (ref 15–41)
Albumin: 4.3 g/dL (ref 3.5–5.0)
Alkaline Phosphatase: 62 U/L (ref 38–126)
Anion gap: 11 (ref 5–15)
BUN: 14 mg/dL (ref 8–23)
CO2: 26 mmol/L (ref 22–32)
Calcium: 9.8 mg/dL (ref 8.9–10.3)
Chloride: 103 mmol/L (ref 98–111)
Creatinine: 0.93 mg/dL (ref 0.61–1.24)
GFR, Estimated: >60 mL/min (ref >60)
Glucose, Bld: 111 mg/dL — ABNORMAL HIGH (ref 70–99)
Potassium: 4.9 mmol/L (ref 3.5–5.1)
Sodium: 140 mmol/L (ref 135–145)
Total Bilirubin: 0.7 mg/dL (ref 0.0–1.2)
Total Protein: 7.8 g/dL (ref 6.5–8.1)

## 2024-06-10 LAB — CBC WITH DIFFERENTIAL (CANCER CENTER ONLY)
Abs Immature Granulocytes: 0.02 K/uL (ref 0.00–0.07)
Basophils Absolute: 0 K/uL (ref 0.0–0.1)
Basophils Relative: 1 %
Eosinophils Absolute: 0.3 K/uL (ref 0.0–0.5)
Eosinophils Relative: 5 %
HCT: 42.2 % (ref 39.0–52.0)
Hemoglobin: 14 g/dL (ref 13.0–17.0)
Immature Granulocytes: 0 %
Lymphocytes Relative: 39 %
Lymphs Abs: 2.4 K/uL (ref 0.7–4.0)
MCH: 31.6 pg (ref 26.0–34.0)
MCHC: 33.2 g/dL (ref 30.0–36.0)
MCV: 95.3 fL (ref 80.0–100.0)
Monocytes Absolute: 0.6 K/uL (ref 0.1–1.0)
Monocytes Relative: 10 %
Neutro Abs: 2.7 K/uL (ref 1.7–7.7)
Neutrophils Relative %: 45 %
Platelet Count: 217 K/uL (ref 150–400)
RBC: 4.43 MIL/uL (ref 4.22–5.81)
RDW: 14.6 % (ref 11.5–15.5)
WBC Count: 6.1 K/uL (ref 4.0–10.5)
nRBC: 0 % (ref 0.0–0.2)

## 2024-06-10 LAB — IRON AND IRON BINDING CAPACITY (CC-WL,HP ONLY)
Iron: 95 ug/dL (ref 45–182)
Saturation Ratios: 28 % (ref 17.9–39.5)
TIBC: 346 ug/dL (ref 250–450)
UIBC: 251 ug/dL

## 2024-06-10 LAB — FERRITIN: Ferritin: 164 ng/mL (ref 24–336)

## 2024-06-10 NOTE — Telephone Encounter (Signed)
-----   Message from Maude JONELLE Crease sent at 06/10/2024  1:36 PM EST ----- Please call and let him know that the iron  level is okay.  Thanks.  Jeralyn ----- Message ----- From: Rebecka, [facility] [place] Sent: 06/10/2024   8:05 AM EST To: Maude JONELLE Crease, MD

## 2024-06-10 NOTE — Telephone Encounter (Signed)
 Advised via MyChart.

## 2024-06-10 NOTE — Progress Notes (Signed)
 Hematology and Oncology Follow Up Visit  Justin Rosales [id number] 24-May-1951 73 y.o. 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  We last saw him back in June.  He has had a good summer.  They were open [place].  They enjoyed it.  They went to [facility] for a baseball game.  They also enjoyed that.  Thankfully, he has had no flareups of ulcerative colitis.  Over last saw him, his ferritin was 159 with an iron  saturation of 27%.  He has had no problems with bleeding.  He has had no change in bowel or bladder habits.  He has had no cough or shortness of breath.  Thankfully, he has avoided COVID.  I think he is now is on new medication for the ulcerative colitis.  He is taking  Tremfya .  Overall, I will say his performance status is probably ECOG 0.      Current Outpatient Medications:    amLODipine -valsartan  (EXFORGE ) 5-160 MG tablet, TAKE 1 TABLET BY MOUTH DAILY, Disp: 120 tablet, Rfl: 2   Bepotastine Besilate 1.5 % SOLN, 1 drop 2 (two) times daily., Disp: , Rfl:    Cholecalciferol (DIALYVITE VITAMIN D 5000) 125 MCG (5000 UT) capsule, Take 5,000 Units by mouth daily., Disp: , Rfl:    folic acid  (FOLVITE ) 1 MG tablet, TAKE 2 TABLETS BY MOUTH DAILY, Disp: 240 tablet, Rfl: 2   mercaptopurine  (PURINETHOL ) 50 MG tablet, TAKE 1 AND 1/2 TABLETS BY MOUTH  DAILY (Patient taking differently: Take 100 mg by mouth daily.), Disp: 180 tablet, Rfl: 3   Multiple Vitamin (MULTIVITAMIN WITH MINERALS) TABS tablet, Take 1 tablet by mouth daily., Disp: , Rfl:    rosuvastatin  (CRESTOR ) 20 MG tablet, TAKE 1 TABLET BY MOUTH DAILY, Disp: 120 tablet, Rfl: 2   Saccharomyces  boulardii (FLORASTOR PO), Take 1 capsule by mouth in the morning and at bedtime., Disp: , Rfl:    sildenafil  (VIAGRA ) 100 MG tablet, Take 1 tablet (100 mg total) by mouth daily as needed for erectile dysfunction., Disp: 30 tablet, Rfl: 2   triamcinolone  cream (KENALOG ) 0.1 %, Apply 1 Application topically 2 (two) times daily., Disp: 30 g, Rfl: 0   Guselkumab  (TREMFYA  PEN) 200 MG/2ML SOAJ, Inject 1 pen  into the skin every 28 (twenty-eight) days. (Patient not taking: Reported on 06/10/2024), Disp: 2 mL, Rfl: 5  Allergies: No Known Allergies  Past Medical History, Surgical history, Social history, and Family History were reviewed and updated.  Review of Systems: Review of Systems  Constitutional:  Positive for fatigue.  HENT:  Negative.    Eyes: Negative.   Respiratory:  Positive for shortness of breath.   Cardiovascular: Negative.   Gastrointestinal:  Positive for blood in stool and diarrhea.  Endocrine: Negative.   Genitourinary: Negative.    Musculoskeletal: Negative.   Skin: Negative.   Neurological: Negative.   Hematological: Negative.   Psychiatric/Behavioral: Negative.      Physical Exam:  height is 5' 10 (1.778 m) and weight is 229 lb (103.9 kg). His oral temperature is 97.7 F (36.5 C). His blood pressure is 142/84 (abnormal, pended) and his pulse is 90. His respiration is 20 and oxygen  saturation is 96%.   Wt Readings from Last 3 Encounters:  06/10/24 229 lb (103.9 kg)  05/25/24 231 lb 9.6 oz (105.1 kg)  05/24/24 227 lb (103 kg)    Physical Exam Vitals reviewed.  HENT:     Head: Normocephalic and atraumatic.  Eyes:     Pupils: Pupils are equal, round, and reactive to light.  Cardiovascular:     Rate and Rhythm: Normal rate and regular rhythm.     Heart sounds: Normal heart sounds.  Pulmonary:     Effort: Pulmonary effort is normal.     Breath sounds: Normal breath sounds.  Abdominal:     General: Bowel sounds are normal.     Palpations: Abdomen is soft.   Musculoskeletal:        General: No tenderness or deformity. Normal range of motion.     Cervical back: Normal range of motion.  Lymphadenopathy:     Cervical: No cervical adenopathy.  Skin:    General: Skin is warm and dry.     Findings: No erythema or rash.  Neurological:     Mental Status: He is alert and oriented to person, place, and time.  Psychiatric:        Behavior: Behavior normal.        Thought Content: Thought content normal.        Judgment: Judgment normal.    Lab Results  Component Value Date   WBC 6.1 06/10/2024   HGB 14.0 06/10/2024   HCT 42.2 06/10/2024   MCV 95.3 06/10/2024   PLT 217 06/10/2024     Chemistry      Component Value Date/Time   NA 140 06/10/2024 0753   K 4.9 06/10/2024 0753   CL 103 06/10/2024 0753   CO2 26 06/10/2024 0753   BUN 14 06/10/2024 0753   CREATININE 0.93 06/10/2024 0753      Component Value Date/Time   CALCIUM  9.8 06/10/2024 0753   ALKPHOS 62 06/10/2024 0753   AST 34 06/10/2024 0753   ALT 42 06/10/2024 0753   BILITOT 0.7 06/10/2024 0753       Impression and Plan: Justin Rosales is a very nice 73 year old white male.  He has ulcerative colitis.  He had Clostridium diarrhea.  He became profoundly iron  deficient.    I have to believe that his iron  levels will still be okay.  He is not anemic.  The MCV is okay.  I hope that the new ulcer class medication will continue to help him out.  We will go ahead and plan to get him back in 6 months.  I think we can get him through all of the Holidays and all of Winter.    Maude JONELLE Crease, MD 11/21/20258:31 AM

## 2024-06-15 ENCOUNTER — Ambulatory Visit

## 2024-06-22 ENCOUNTER — Ambulatory Visit

## 2024-06-22 ENCOUNTER — Telehealth: Payer: Self-pay | Admitting: Pediatrics

## 2024-06-22 VITALS — BP 147/82 | HR 82 | Temp 98.0°F | Resp 14 | Ht 70.0 in | Wt 232.2 lb

## 2024-06-22 DIAGNOSIS — K5130 Ulcerative (chronic) rectosigmoiditis without complications: Secondary | ICD-10-CM | POA: Diagnosis not present

## 2024-06-22 DIAGNOSIS — K51019 Ulcerative (chronic) pancolitis with unspecified complications: Secondary | ICD-10-CM

## 2024-06-22 MED ORDER — SODIUM CHLORIDE 0.9 % IV SOLN
200.0000 mg | Freq: Once | INTRAVENOUS | Status: AC
  Administered 2024-06-22: 200 mg via INTRAVENOUS
  Filled 2024-06-22: qty 20

## 2024-06-22 NOTE — Progress Notes (Signed)
 Diagnosis: Ulcerative Colitis  Provider:  Lonna Coder MD  Procedure: IV Infusion  IV Type: Peripheral, IV Location: Left wrist  Tremfya  (guselkumab ) , Dose: 200 mg  Infusion Start Time: 1155  Infusion Stop Time: 1303  Post Infusion IV Care: Peripheral IV Discontinued  Discharge: Condition: Good, Destination: Home . AVS Declined  Performed by:  Donny Childes, RN

## 2024-06-22 NOTE — Telephone Encounter (Signed)
 PT is calling to get more information on his infusions. He stated that insurance is now billing him for the Tremfya  infusions. Please advise.

## 2024-06-27 ENCOUNTER — Telehealth: Payer: Self-pay | Admitting: Pediatrics

## 2024-06-27 DIAGNOSIS — Z7960 Long term (current) use of unspecified immunomodulators and immunosuppressants: Secondary | ICD-10-CM

## 2024-06-27 DIAGNOSIS — K51019 Ulcerative (chronic) pancolitis with unspecified complications: Secondary | ICD-10-CM

## 2024-06-27 DIAGNOSIS — K529 Noninfective gastroenteritis and colitis, unspecified: Secondary | ICD-10-CM

## 2024-06-27 NOTE — Telephone Encounter (Signed)
 Inbound call from patient stating that he is needing to speak to either Dr. Suzann or [place] in regards to his current treatment option. Patient is requesting a call back.Please advise.

## 2024-06-27 NOTE — Telephone Encounter (Signed)
 Let me look into this and I will get back with you.

## 2024-06-28 NOTE — Telephone Encounter (Signed)
 I am happy for Justin Rosales, to transition to Dr. Andy care, if that is what he would like. I am also very happy to keep him as well. We will wait to see what he would like to do (if you can ask [place]). In the interim, let's get some updated blood work at his convenience (CBC/CMP/ESR/CRP & a Fecal Calprotectin). Thanks. GM

## 2024-06-28 NOTE — Telephone Encounter (Signed)
 Called and spoke with patient. Patient has requested to remain Dr. Melba patient. Patient has been advised that he can come in for labs at his convenience. No appt needed. I provided patient with lab hours and location. Patient verbalized understanding and had no concerns at the end of the call.  Lab and stool study orders entered in Epic.

## 2024-06-28 NOTE — Telephone Encounter (Signed)
 Called & spoke with patient and his wife. Patient reports that he completed the Tremfya  induction infusions on 06/22/24 and will be due for first Tremfya  maintenance injection on 07/20/24. Patient wanted to be sure that he can reach out to [facility] specialty pharmacy to fill at this time. I informed patient that he can and we always recommend that he requests the medication several weeks before he is due to avoid delay in case there are any issues. Patient is aware that we prescribed the pen and he has a 32-month supply available. Patient states that his symptoms are stable, not worse. Patient did confirm that he stopped 6-MP. Patient and his wife mentioned that it had been several months since he has had any lab work and they want to know when he will need repeat labs. Advised that I will check with MD, they may want to wait until he has been able to have a few Tremfya  maintenance doses. Patient's wife just does not want the lab work to be missed if it is needed. They do not want him going backwards, they are working towards a full remission. Please advise on timing of repeat labs. Thanks

## 2024-06-28 NOTE — Telephone Encounter (Signed)
 Patient received a bill stating Tremfya  will not be covered due to no auth was on file.  Auth was submitted via [facility] and states no shara is required.  I called [employer] and was told shara is required despite the portal stating no shara is needed. Rep could not explain to me why the portal states no shara is required when shara is actually needed only that it does require auth.  I submitted an auth and it was approved.   I also have sent an appeal letter requesting a retro auth for previous treatment.  I have spoken with patient and explained that a retro auth and appeal has been submitted and turn-around time is typically 4wks.  Patient understood and had no further questions at this time.  Auth Submission: APPROVED Site of care: Site of care: [facility] Payer: [facility] MEDICARE Medication & CPT/J Code(s) submitted: TREMFYA  [id number] Diagnosis Code: K51.919 Route of submission (phone, fax, portal): PORTAL Phone # Fax # Auth type: Buy/Bill PB Units/visits requested: 200MG  X 3 DOSES Reference number: [id number] Approval from: 06/27/24 to 06/27/24

## 2024-06-28 NOTE — Addendum Note (Signed)
 Addended by: Jayveion Stalling N on: 06/28/2024 11:43 AM   Modules accepted: Orders

## 2024-07-08 ENCOUNTER — Other Ambulatory Visit (INDEPENDENT_AMBULATORY_CARE_PROVIDER_SITE_OTHER)

## 2024-07-08 DIAGNOSIS — K529 Noninfective gastroenteritis and colitis, unspecified: Secondary | ICD-10-CM | POA: Diagnosis not present

## 2024-07-08 DIAGNOSIS — K51019 Ulcerative (chronic) pancolitis with unspecified complications: Secondary | ICD-10-CM | POA: Diagnosis not present

## 2024-07-08 DIAGNOSIS — Z7960 Long term (current) use of unspecified immunomodulators and immunosuppressants: Secondary | ICD-10-CM

## 2024-07-08 LAB — CBC
HCT: 41.9 % (ref 39.0–52.0)
Hemoglobin: 14.2 g/dL (ref 13.0–17.0)
MCHC: 33.8 g/dL (ref 30.0–36.0)
MCV: 92.2 fl (ref 78.0–100.0)
Platelets: 235 K/uL (ref 150.0–400.0)
RBC: 4.55 Mil/uL (ref 4.22–5.81)
RDW: 15.6 % — ABNORMAL HIGH (ref 11.5–15.5)
WBC: 6.1 K/uL (ref 4.0–10.5)

## 2024-07-08 LAB — COMPREHENSIVE METABOLIC PANEL WITH GFR
ALT: 33 U/L (ref 3–53)
AST: 26 U/L (ref 5–37)
Albumin: 4.2 g/dL (ref 3.5–5.2)
Alkaline Phosphatase: 68 U/L (ref 39–117)
BUN: 16 mg/dL (ref 6–23)
CO2: 26 meq/L (ref 19–32)
Calcium: 9.2 mg/dL (ref 8.4–10.5)
Chloride: 105 meq/L (ref 96–112)
Creatinine, Ser: 0.89 mg/dL (ref 0.40–1.50)
GFR: 85.14 mL/min
Glucose, Bld: 100 mg/dL — ABNORMAL HIGH (ref 70–99)
Potassium: 4.5 meq/L (ref 3.5–5.1)
Sodium: 137 meq/L (ref 135–145)
Total Bilirubin: 0.5 mg/dL (ref 0.2–1.2)
Total Protein: 7.4 g/dL (ref 6.0–8.3)

## 2024-07-08 LAB — C-REACTIVE PROTEIN: CRP: <0.5 mg/dL — ABNORMAL LOW (ref 1.0–20.0)

## 2024-07-08 LAB — SEDIMENTATION RATE: Sed Rate: 24 mm/h — ABNORMAL HIGH (ref 0–20)

## 2024-07-09 ENCOUNTER — Ambulatory Visit: Payer: Self-pay | Admitting: Gastroenterology

## 2024-07-12 ENCOUNTER — Other Ambulatory Visit

## 2024-07-12 DIAGNOSIS — Z7960 Long term (current) use of unspecified immunomodulators and immunosuppressants: Secondary | ICD-10-CM

## 2024-07-12 DIAGNOSIS — K529 Noninfective gastroenteritis and colitis, unspecified: Secondary | ICD-10-CM

## 2024-07-12 DIAGNOSIS — K51019 Ulcerative (chronic) pancolitis with unspecified complications: Secondary | ICD-10-CM

## 2024-07-16 LAB — CALPROTECTIN, FECAL: Calprotectin, Fecal: 99 ug/g (ref 0–120)

## 2024-07-22 NOTE — Addendum Note (Signed)
 Addended by: DAYNE SHERRY RAMAN on: 07/22/2024 08:07 AM   Modules accepted: Orders

## 2024-08-09 ENCOUNTER — Other Ambulatory Visit: Payer: Self-pay | Admitting: Hematology & Oncology

## 2024-08-10 ENCOUNTER — Encounter: Payer: Self-pay | Admitting: Hematology & Oncology

## 2024-08-26 ENCOUNTER — Ambulatory Visit: Payer: Self-pay | Admitting: Family Medicine

## 2024-08-26 ENCOUNTER — Encounter: Payer: Self-pay | Admitting: Family Medicine

## 2024-08-26 ENCOUNTER — Ambulatory Visit: Admitting: Family Medicine

## 2024-08-26 VITALS — BP 128/76 | HR 87 | Temp 98.0°F | Resp 16 | Ht 70.0 in | Wt 230.0 lb

## 2024-08-26 DIAGNOSIS — I10 Essential (primary) hypertension: Secondary | ICD-10-CM

## 2024-08-26 DIAGNOSIS — Z0000 Encounter for general adult medical examination without abnormal findings: Secondary | ICD-10-CM

## 2024-08-26 LAB — LIPID PANEL
Cholesterol: 128 mg/dL (ref 28–200)
HDL: 46 mg/dL
LDL Cholesterol: 61 mg/dL (ref 10–99)
NonHDL: 82.23
Total CHOL/HDL Ratio: 3
Triglycerides: 108 mg/dL (ref 10.0–149.0)
VLDL: 21.6 mg/dL (ref 0.0–40.0)

## 2024-08-26 LAB — COMPREHENSIVE METABOLIC PANEL WITH GFR
ALT: 31 U/L (ref 3–53)
AST: 26 U/L (ref 5–37)
Albumin: 4.2 g/dL (ref 3.5–5.2)
Alkaline Phosphatase: 71 U/L (ref 39–117)
BUN: 14 mg/dL (ref 6–23)
CO2: 27 meq/L (ref 19–32)
Calcium: 9 mg/dL (ref 8.4–10.5)
Chloride: 104 meq/L (ref 96–112)
Creatinine, Ser: 0.91 mg/dL (ref 0.40–1.50)
GFR: 83.66 mL/min
Glucose, Bld: 100 mg/dL — ABNORMAL HIGH (ref 70–99)
Potassium: 4.2 meq/L (ref 3.5–5.1)
Sodium: 139 meq/L (ref 135–145)
Total Bilirubin: 0.5 mg/dL (ref 0.2–1.2)
Total Protein: 7.3 g/dL (ref 6.0–8.3)

## 2024-08-26 LAB — CBC
HCT: 42.2 % (ref 39.0–52.0)
Hemoglobin: 14.1 g/dL (ref 13.0–17.0)
MCHC: 33.4 g/dL (ref 30.0–36.0)
MCV: 89.3 fl (ref 78.0–100.0)
Platelets: 247 10*3/uL (ref 150.0–400.0)
RBC: 4.73 Mil/uL (ref 4.22–5.81)
RDW: 14.2 % (ref 11.5–15.5)
WBC: 5.1 10*3/uL (ref 4.0–10.5)

## 2024-08-26 NOTE — Patient Instructions (Addendum)
 Give us  2-3 business days to get the results of your labs back.   Keep the diet clean and stay active.  Please consider adding some weight resistance exercise to your routine. Consider yoga as well.   Please get me a copy of your advanced directive form at your convenience.   The Shingrix vaccine (for shingles) is a 2 shot series spaced 2-6 months apart. It can make people feel low energy, achy and almost like they have the flu for 48 hours after injection. 1/5 people can have nausea and/or vomiting. Please plan accordingly when deciding on when to get this shot. Call your pharmacy to get this. The second shot of the series is less severe regarding the side effects, but it still lasts 48 hours.   Let us  know if you need anything.

## 2024-08-26 NOTE — Progress Notes (Signed)
 Chief Complaint  Patient presents with   Annual Exam    CPE    Well Male Justin Rosales is here for a complete physical.   His last physical was >1 year ago.  Current diet: in general, a healthy diet.   Current exercise: walking Weight trend: stable Fatigue out of ordinary? No. Seat belt? Yes.   Advanced directive? Yes  Health maintenance Shingrix- No Colonoscopy- Yes Tetanus- Yes Hep C- Yes Pneumonia vaccine- Yes AAA screening- Yes  Past Medical History:  Diagnosis Date   Allergy    Anemia    Arthritis    Blood transfusion without reported diagnosis    C. 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   Goals of care, counseling/discussion 08/28/2020   Hypertension    Iron  deficiency anemia due to chronic blood loss 08/28/2020   Iron  malabsorption 08/28/2020   Lower leg DVT (deep venous thromboembolism), acute, right (HCC) 09/28/2020   Melanoma (HCC)    UC (ulcerative colitis) (HCC)      Past Surgical History:  Procedure Laterality Date   COLONOSCOPY     First done at [facility] around age 30. Dr Timm possibly With [facility] x2. last one done around 2012    COLONOSCOPY N/A 03/07/2024   Procedure: COLONOSCOPY;  Surgeon: Suzann Inocente HERO, MD;  Location: [facility];  Service: Gastroenterology;  Laterality: N/A;  with chromoendoscopy   POLYPECTOMY  03/07/2024   Procedure: POLYPECTOMY, INTESTINE;  Surgeon: Suzann Inocente HERO, MD;  Location: [facility];  Service: Gastroenterology;;    Medications  Medications Ordered Prior to Encounter[1]   Allergies Allergies[2]  Family History Family History  Problem Relation Age of Onset   Hypertension Mother    Diabetes Mother    Alzheimer's disease Mother    Arthritis Mother    Parkinson's disease Father    Alzheimer's disease Father    Dementia Sister    Colon cancer Neg Hx    Esophageal cancer Neg Hx     Rectal cancer Neg Hx    Stomach cancer Neg Hx    Colon polyps Neg Hx     Review of Systems: Constitutional:  no fevers Eye:  no recent significant change in vision Ears:  No changes in hearing Nose/Mouth/Throat:  no complaints of nasal congestion, no sore throat Cardiovascular: no chest pain Respiratory:  No shortness of breath Gastrointestinal:  No change in bowel habits GU:  No frequency Integumentary:  no abnormal skin lesions reported Neurologic:  no headaches Endocrine:  denies unexplained weight changes  Exam BP 128/76 (BP Location: Left Arm, Cuff Size: Normal)   Pulse 87   Temp 98 F (36.7 C) (Oral)   Resp 16   Ht 5' 10 (1.778 m)   Wt 230 lb (104.3 kg)   SpO2 98%   BMI 33.00 kg/m  General:  well developed, well nourished, in no apparent distress Skin:  no significant moles, warts, or growths Head:  no masses, lesions, or tenderness Eyes:  pupils equal and round, sclera anicteric without injection Ears:  canals without lesions, TMs shiny without retraction, no obvious effusion, no erythema Nose:  nares patent, mucosa normal Throat/Pharynx:  lips and gingiva without lesion; tongue and uvula midline; non-inflamed pharynx; no exudates or postnasal drainage Lungs:  clear to auscultation, breath sounds equal bilaterally, no respiratory distress Cardio:  regular rate and rhythm, no LE edema or bruits Rectal: Deferred GI:  BS+, S, NT, moderately distended, no masses or organomegaly Musculoskeletal:  symmetrical muscle groups noted without atrophy or deformity Neuro:  gait normal; deep tendon reflexes normal and symmetric Psych: well oriented with normal range of affect and appropriate judgment/insight  Assessment and Plan  Well adult exam  Essential hypertension - Plan: CBC, Comprehensive metabolic panel with GFR, Lipid panel   Well 74 y.o. male. Counseled on diet and exercise. Advanced directive form requested today.  Shingrix recommended. Other orders as  above. Follow up in 6 mo.  The patient voiced understanding and agreement to the plan.  Mabel [place], DO 08/26/24 9:16 AM     [1]  Current Outpatient Medications on File Prior to Visit  Medication Sig Dispense Refill   amLODipine -valsartan  (EXFORGE ) 5-160 MG tablet TAKE 1 TABLET BY MOUTH DAILY 120 tablet 2   Bepotastine Besilate 1.5 % SOLN 1 drop 2 (two) times daily.     Cholecalciferol (DIALYVITE VITAMIN D 5000) 125 MCG (5000 UT) capsule Take 5,000 Units by mouth daily.     folic acid  (FOLVITE ) 1 MG tablet TAKE 2 TABLETS BY MOUTH DAILY 240 tablet 2   Guselkumab  (TREMFYA  PEN) 200 MG/2ML SOAJ Inject 1 pen  into the skin every 28 (twenty-eight) days. (Patient not taking: Reported on 06/10/2024) 2 mL 5   Multiple Vitamin (MULTIVITAMIN WITH MINERALS) TABS tablet Take 1 tablet by mouth daily.     rosuvastatin  (CRESTOR ) 20 MG tablet TAKE 1 TABLET BY MOUTH DAILY 120 tablet 2   Saccharomyces boulardii (FLORASTOR PO) Take 1 capsule by mouth in the morning and at bedtime.     sildenafil  (VIAGRA ) 100 MG tablet Take 1 tablet (100 mg total) by mouth daily as needed for erectile dysfunction. 30 tablet 2   triamcinolone  cream (KENALOG ) 0.1 % Apply 1 Application topically 2 (two) times daily. 30 g 0   No current facility-administered medications on file prior to visit.  [2] No Known Allergies

## 2024-09-16 ENCOUNTER — Ambulatory Visit: Admitting: Gastroenterology

## 2024-12-08 ENCOUNTER — Inpatient Hospital Stay

## 2024-12-08 ENCOUNTER — Inpatient Hospital Stay: Admitting: Hematology & Oncology

## 2025-02-24 ENCOUNTER — Ambulatory Visit: Admitting: Family Medicine

## 2025-08-28 ENCOUNTER — Encounter: Admitting: Family Medicine
